# Patient Record
Sex: Male | Born: 1950 | Hispanic: No | Marital: Single | Smoking: Never smoker
Health system: Southern US, Community
[De-identification: ages and names within clinical notes are randomized; demographics above are authoritative.]

## PROBLEM LIST (undated history)

## (undated) DIAGNOSIS — K2210 Ulcer of esophagus without bleeding: Secondary | ICD-10-CM

## (undated) DIAGNOSIS — I10 Essential (primary) hypertension: Secondary | ICD-10-CM

## (undated) DIAGNOSIS — S72009A Fracture of unspecified part of neck of unspecified femur, initial encounter for closed fracture: Secondary | ICD-10-CM

## (undated) DIAGNOSIS — K226 Gastro-esophageal laceration-hemorrhage syndrome: Secondary | ICD-10-CM

## (undated) DIAGNOSIS — Z96649 Presence of unspecified artificial hip joint: Secondary | ICD-10-CM

## (undated) DIAGNOSIS — F99 Mental disorder, not otherwise specified: Secondary | ICD-10-CM

## (undated) DIAGNOSIS — M978XXA Periprosthetic fracture around other internal prosthetic joint, initial encounter: Secondary | ICD-10-CM

## (undated) DIAGNOSIS — K760 Fatty (change of) liver, not elsewhere classified: Secondary | ICD-10-CM

## (undated) DIAGNOSIS — S72019A Unspecified intracapsular fracture of unspecified femur, initial encounter for closed fracture: Secondary | ICD-10-CM

## (undated) DIAGNOSIS — E119 Type 2 diabetes mellitus without complications: Secondary | ICD-10-CM

## (undated) DIAGNOSIS — J690 Pneumonitis due to inhalation of food and vomit: Secondary | ICD-10-CM

## (undated) DIAGNOSIS — N281 Cyst of kidney, acquired: Secondary | ICD-10-CM

## (undated) DIAGNOSIS — F209 Schizophrenia, unspecified: Secondary | ICD-10-CM

## (undated) HISTORY — PX: JOINT REPLACEMENT: SHX530

---

## 2009-01-14 ENCOUNTER — Ambulatory Visit: Payer: Self-pay | Admitting: Psychiatry

## 2009-01-14 ENCOUNTER — Inpatient Hospital Stay (HOSPITAL_COMMUNITY): Admission: EM | Admit: 2009-01-14 | Discharge: 2009-01-17 | Payer: Self-pay | Admitting: Psychiatry

## 2009-01-14 ENCOUNTER — Emergency Department (HOSPITAL_COMMUNITY): Admission: EM | Admit: 2009-01-14 | Discharge: 2009-01-14 | Payer: Self-pay | Admitting: Emergency Medicine

## 2010-04-28 LAB — CBC
HCT: 41.3 % (ref 39.0–52.0)
MCV: 85.9 fL (ref 78.0–100.0)
RBC: 4.8 MIL/uL (ref 4.22–5.81)
WBC: 10.1 10*3/uL (ref 4.0–10.5)

## 2010-04-28 LAB — BASIC METABOLIC PANEL
BUN: 13 mg/dL (ref 6–23)
BUN: 15 mg/dL (ref 6–23)
CO2: 28 mEq/L (ref 19–32)
Calcium: 8.8 mg/dL (ref 8.4–10.5)
Calcium: 9.3 mg/dL (ref 8.4–10.5)
Chloride: 83 mEq/L — ABNORMAL LOW (ref 96–112)
Creatinine, Ser: 0.94 mg/dL (ref 0.4–1.5)
Creatinine, Ser: 1.13 mg/dL (ref 0.4–1.5)
GFR calc Af Amer: >60 mL/min (ref >60)
GFR calc Af Amer: >60 mL/min (ref >60)
GFR calc non Af Amer: >60 mL/min (ref >60)
Glucose, Bld: 169 mg/dL — ABNORMAL HIGH (ref 70–99)
Potassium: 2.8 mEq/L — ABNORMAL LOW (ref 3.5–5.1)
Potassium: 3.1 mEq/L — ABNORMAL LOW (ref 3.5–5.1)

## 2010-04-28 LAB — DIFFERENTIAL
Eosinophils Absolute: 0 10*3/uL (ref 0.0–0.7)
Eosinophils Relative: 0 % (ref 0–5)
Lymphs Abs: 2.5 10*3/uL (ref 0.7–4.0)
Monocytes Relative: 7 % (ref 3–12)

## 2010-04-28 LAB — GLUCOSE, CAPILLARY
Glucose-Capillary: 142 mg/dL — ABNORMAL HIGH (ref 70–99)
Glucose-Capillary: 159 mg/dL — ABNORMAL HIGH (ref 70–99)
Glucose-Capillary: 174 mg/dL — ABNORMAL HIGH (ref 70–99)
Glucose-Capillary: 180 mg/dL — ABNORMAL HIGH (ref 70–99)
Glucose-Capillary: 200 mg/dL — ABNORMAL HIGH (ref 70–99)
Glucose-Capillary: 212 mg/dL — ABNORMAL HIGH (ref 70–99)
Glucose-Capillary: 229 mg/dL — ABNORMAL HIGH (ref 70–99)

## 2010-04-28 LAB — POCT CARDIAC MARKERS
CKMB, poc: 6.6 ng/mL (ref 1.0–8.0)
Myoglobin, poc: 202 ng/mL (ref 12–200)
Troponin i, poc: <0.05 ng/mL (ref 0.00–0.09)

## 2010-04-28 LAB — RAPID URINE DRUG SCREEN, HOSP PERFORMED
Barbiturates: NOT DETECTED
Benzodiazepines: NOT DETECTED

## 2010-04-28 LAB — HEMOGLOBIN A1C: Mean Plasma Glucose: 189 mg/dL

## 2010-04-28 LAB — ETHANOL: Alcohol, Ethyl (B): <5 mg/dL (ref 0–10)

## 2012-02-26 ENCOUNTER — Inpatient Hospital Stay (HOSPITAL_COMMUNITY)
Admission: EM | Admit: 2012-02-26 | Discharge: 2012-03-04 | DRG: 885 | Disposition: A | Discharge status: Other Acute Inpatient Hospital | Payer: MEDICAID | Attending: Internal Medicine | Admitting: Internal Medicine

## 2012-02-26 ENCOUNTER — Emergency Department (HOSPITAL_COMMUNITY): Payer: Self-pay

## 2012-02-26 DIAGNOSIS — E46 Unspecified protein-calorie malnutrition: Secondary | ICD-10-CM

## 2012-02-26 DIAGNOSIS — F200 Paranoid schizophrenia: Principal | ICD-10-CM | POA: Diagnosis present

## 2012-02-26 DIAGNOSIS — N289 Disorder of kidney and ureter, unspecified: Secondary | ICD-10-CM

## 2012-02-26 DIAGNOSIS — Z9119 Patient's noncompliance with other medical treatment and regimen: Secondary | ICD-10-CM

## 2012-02-26 DIAGNOSIS — F209 Schizophrenia, unspecified: Secondary | ICD-10-CM | POA: Diagnosis present

## 2012-02-26 DIAGNOSIS — E119 Type 2 diabetes mellitus without complications: Secondary | ICD-10-CM | POA: Diagnosis present

## 2012-02-26 DIAGNOSIS — I10 Essential (primary) hypertension: Secondary | ICD-10-CM | POA: Diagnosis present

## 2012-02-26 DIAGNOSIS — Z79899 Other long term (current) drug therapy: Secondary | ICD-10-CM

## 2012-02-26 DIAGNOSIS — Z7982 Long term (current) use of aspirin: Secondary | ICD-10-CM

## 2012-02-26 DIAGNOSIS — N179 Acute kidney failure, unspecified: Secondary | ICD-10-CM | POA: Diagnosis present

## 2012-02-26 DIAGNOSIS — Z91199 Patient's noncompliance with other medical treatment and regimen due to unspecified reason: Secondary | ICD-10-CM

## 2012-02-26 DIAGNOSIS — F29 Unspecified psychosis not due to a substance or known physiological condition: Secondary | ICD-10-CM

## 2012-02-26 DIAGNOSIS — E876 Hypokalemia: Secondary | ICD-10-CM | POA: Diagnosis present

## 2012-02-26 HISTORY — DX: Type 2 diabetes mellitus without complications: E11.9

## 2012-02-26 HISTORY — DX: Essential (primary) hypertension: I10

## 2012-02-26 LAB — CBC WITH DIFFERENTIAL/PLATELET
Basophils Relative: 0 % (ref 0–1)
HCT: 44.2 % (ref 39.0–52.0)
Hemoglobin: 15 g/dL (ref 13.0–17.0)
Lymphocytes Relative: 22 % (ref 12–46)
MCHC: 33.9 g/dL (ref 30.0–36.0)
Monocytes Absolute: 0.6 10*3/uL (ref 0.1–1.0)
Monocytes Relative: 6 % (ref 3–12)
Neutro Abs: 6.9 10*3/uL (ref 1.7–7.7)

## 2012-02-26 LAB — COMPREHENSIVE METABOLIC PANEL
BUN: 27 mg/dL — ABNORMAL HIGH (ref 6–23)
CO2: 24 mEq/L (ref 19–32)
Chloride: 97 mEq/L (ref 96–112)
Creatinine, Ser: 1.71 mg/dL — ABNORMAL HIGH (ref 0.50–1.35)
GFR calc non Af Amer: 41 mL/min — ABNORMAL LOW (ref >90)
Total Bilirubin: 0.6 mg/dL (ref 0.3–1.2)

## 2012-02-26 LAB — LIPASE, BLOOD: Lipase: 37 U/L (ref 11–59)

## 2012-02-26 MED ORDER — LORAZEPAM 2 MG/ML IJ SOLN
2.0000 mg | Freq: Three times a day (TID) | INTRAMUSCULAR | Status: DC | PRN
  Administered 2012-02-27: 2 mg via INTRAMUSCULAR
  Filled 2012-02-26 (×2): qty 1

## 2012-02-26 MED ORDER — ZIPRASIDONE MESYLATE 20 MG IM SOLR
10.0000 mg | Freq: Three times a day (TID) | INTRAMUSCULAR | Status: DC | PRN
  Filled 2012-02-26 (×2): qty 20

## 2012-02-26 MED ORDER — POTASSIUM CHLORIDE CRYS ER 20 MEQ PO TBCR
40.0000 meq | EXTENDED_RELEASE_TABLET | Freq: Once | ORAL | Status: DC
  Filled 2012-02-26 (×3): qty 2

## 2012-02-26 MED ORDER — ZIPRASIDONE MESYLATE 20 MG IM SOLR
10.0000 mg | Freq: Three times a day (TID) | INTRAMUSCULAR | Status: DC | PRN
  Administered 2012-02-27: 10 mg via INTRAMUSCULAR
  Filled 2012-02-26: qty 20

## 2012-02-26 MED ORDER — SODIUM CHLORIDE 0.9 % IV SOLN: Freq: Once | INTRAVENOUS | Status: DC

## 2012-02-26 NOTE — ED Notes (Signed)
Patient transported to X-ray 
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
## 2012-02-26 NOTE — ED Notes (Signed)
Pt completing telepsych with ACT team at bedside, pt easily agitated
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
## 2012-02-26 NOTE — ED Notes (Signed)
Pt calm and resting at present, no complaints at this time.  Telepsych paperwork completed and faxed.
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
## 2012-02-26 NOTE — ED Notes (Addendum)
Spoke with [name], MD who is the pts sister, pt sister states, "My brother was diagnosed with a mood disorder when he was a teenager. He was hospitalized a couple times in [place]. He came to the [place] in the 80's. He was seen at your hospital 3-5 years ago.I refilled some of the medication for my brother after he was discharged from [facility]. He has been taking 10 mg Haldol in the morning & 20 mg Haldol in the evening daily, but I am unsure of the last time the medication was taken or refilled. His caregiver Lafonda Mosses (last name unknown) has taken him to a (name unknown) physician since then & he was prescribed Metformin. I don't know the dosage or when it was last refilled. I did call him & ask him everyday to read the labels on his medicine & to take his medication. He then got upset & told me not to ask anymore questions, so I quit asking.The phone numbers of the 2 pharmacies are [phone] & [phone], they would call me when they prescriptions were out." the sister requests to be updated on plan of care & was given the hospital's number to call to request future updates

## 2012-02-26 NOTE — ED Notes (Signed)
Pt states that he is "always ok." Pt states that he is originally from [place]. Pt states he does not have a place to stay right now.

## 2012-02-27 ENCOUNTER — Encounter (HOSPITAL_COMMUNITY): Payer: Self-pay | Admitting: *Deleted

## 2012-02-27 DIAGNOSIS — N179 Acute kidney failure, unspecified: Secondary | ICD-10-CM | POA: Diagnosis present

## 2012-02-27 DIAGNOSIS — F209 Schizophrenia, unspecified: Secondary | ICD-10-CM | POA: Diagnosis present

## 2012-02-27 DIAGNOSIS — Z9119 Patient's noncompliance with other medical treatment and regimen: Secondary | ICD-10-CM

## 2012-02-27 DIAGNOSIS — I10 Essential (primary) hypertension: Secondary | ICD-10-CM | POA: Diagnosis present

## 2012-02-27 DIAGNOSIS — N289 Disorder of kidney and ureter, unspecified: Secondary | ICD-10-CM

## 2012-02-27 DIAGNOSIS — E119 Type 2 diabetes mellitus without complications: Secondary | ICD-10-CM | POA: Diagnosis present

## 2012-02-27 LAB — URINALYSIS, ROUTINE W REFLEX MICROSCOPIC
Glucose, UA: 250 mg/dL — AB
Hgb urine dipstick: NEGATIVE
Leukocytes, UA: NEGATIVE
Specific Gravity, Urine: 1.021 (ref 1.005–1.030)
Urobilinogen, UA: 0.2 mg/dL (ref 0.0–1.0)

## 2012-02-27 LAB — BASIC METABOLIC PANEL
CO2: 24 mEq/L (ref 19–32)
GFR calc non Af Amer: 30 mL/min — ABNORMAL LOW (ref >90)
Glucose, Bld: 227 mg/dL — ABNORMAL HIGH (ref 70–99)
Potassium: 3.1 mEq/L — ABNORMAL LOW (ref 3.5–5.1)
Sodium: 137 mEq/L (ref 135–145)

## 2012-02-27 LAB — RAPID URINE DRUG SCREEN, HOSP PERFORMED: Opiates: NOT DETECTED

## 2012-02-27 LAB — URINE MICROSCOPIC-ADD ON

## 2012-02-27 MED ORDER — POTASSIUM CHLORIDE CRYS ER 20 MEQ PO TBCR
40.0000 meq | EXTENDED_RELEASE_TABLET | Freq: Once | ORAL | Status: AC
  Administered 2012-02-27: 40 meq via ORAL

## 2012-02-27 MED ORDER — LORAZEPAM 2 MG/ML IJ SOLN
2.0000 mg | Freq: Once | INTRAMUSCULAR | Status: AC
  Administered 2012-02-27: 2 mg via INTRAMUSCULAR

## 2012-02-27 MED ORDER — ACETAMINOPHEN 325 MG PO TABS: 650.0000 mg | ORAL_TABLET | Freq: Four times a day (QID) | ORAL | Status: DC | PRN

## 2012-02-27 MED ORDER — HALOPERIDOL 2 MG PO TABS
2.5000 mg | ORAL_TABLET | Freq: Four times a day (QID) | ORAL | Status: DC | PRN
  Filled 2012-02-27: qty 1

## 2012-02-27 MED ORDER — HALOPERIDOL 5 MG PO TABS
5.0000 mg | ORAL_TABLET | Freq: Two times a day (BID) | ORAL | Status: DC
  Filled 2012-02-27 (×3): qty 1

## 2012-02-27 MED ORDER — ACETAMINOPHEN 650 MG RE SUPP: 650.0000 mg | Freq: Four times a day (QID) | RECTAL | Status: DC | PRN

## 2012-02-27 MED ORDER — SODIUM CHLORIDE 0.9 % IV BOLUS (SEPSIS): 1000.0000 mL | Freq: Once | INTRAVENOUS | Status: DC

## 2012-02-27 MED ORDER — ENOXAPARIN SODIUM 40 MG/0.4ML ~~LOC~~ SOLN
40.0000 mg | SUBCUTANEOUS | Status: DC
  Filled 2012-02-27 (×6): qty 0.4

## 2012-02-27 MED ORDER — POTASSIUM CHLORIDE IN NACL 20-0.9 MEQ/L-% IV SOLN
INTRAVENOUS | Status: DC
  Filled 2012-02-27 (×24): qty 1000

## 2012-02-27 MED ORDER — HALOPERIDOL LACTATE 5 MG/ML IJ SOLN: 2.5000 mg | Freq: Four times a day (QID) | INTRAMUSCULAR | Status: DC | PRN

## 2012-02-27 MED ORDER — HALOPERIDOL 5 MG PO TABS
5.0000 mg | ORAL_TABLET | Freq: Once | ORAL | Status: AC
  Administered 2012-02-27: 5 mg via ORAL
  Filled 2012-02-27: qty 1

## 2012-02-27 MED ORDER — HALOPERIDOL LACTATE 5 MG/ML IJ SOLN
5.0000 mg | Freq: Once | INTRAMUSCULAR | Status: DC
  Filled 2012-02-27: qty 1

## 2012-02-27 MED ORDER — SODIUM CHLORIDE 0.9 % IV BOLUS (SEPSIS): 2000.0000 mL | Freq: Once | INTRAVENOUS | Status: DC

## 2012-02-27 NOTE — ED Notes (Signed)
Pt provided urine sample but refuses to have IV started at this time . Plan to reassess PT and attempt to start IV in .

## 2012-02-27 NOTE — ED Notes (Signed)
Internal Medicine on -call notified PT refused IV and ECG.

## 2012-02-27 NOTE — ED Notes (Signed)
  Dr. Norlene Campbell updated re: refusal of potassium med and agitation when approached to assist with hygiene. Also updated re: outstanding order for IVF and Verbal order rec'd discontinue order for IV.

## 2012-02-27 NOTE — ED Notes (Signed)
Pt requested a cup of coffee. Decaf coffee given

## 2012-02-27 NOTE — ED Provider Notes (Addendum)
Pt alert, content, nad. Discussed w act team, placement pending.  Act asks for repeat k. Lab added.   Suzi Roots, MD 02/27/12 [phone]   Recheck pt not eating/drinking.  Bun/cr increased from prior day's.  It is difficult to definitively determine pts baseline renal fxn as last prior labs were 4 yrs ago - given hx htn, diabetes, pt may have some element of renal insuff at baseline, but again, difficult to know.  Given inability to place in psych facility given abn/inc cr, low k, not eating/drinking - will admit to med service. Ivf.     Suzi Roots, MD 02/27/12 [phone]

## 2012-02-27 NOTE — ED Notes (Signed)
PT continues to refuse IV .

## 2012-02-27 NOTE — ED Notes (Signed)
PT reports The DR. Will have to put it in writing and give it to him before he will have an IV.
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
## 2012-02-27 NOTE — ED Notes (Signed)
NO personal items listed on admission.

## 2012-02-27 NOTE — ED Notes (Signed)
Cleaned pt up with help from [employer] and [employer].9:16am JG.

## 2012-02-27 NOTE — BH Assessment (Signed)
[facility] Assessment Progress Note      Pt cannot be reviewed for admission until his potassium level increases.

## 2012-02-27 NOTE — ED Notes (Signed)
MD at bedside. 

## 2012-02-27 NOTE — ED Notes (Signed)
UNable to obtain ECG as ordered because of PT refusal.

## 2012-02-27 NOTE — ED Notes (Addendum)
Lying on bed in stool. Asked pt if we can assist him in cleaning up. Pt refuses assistance stating to come back at 1000 and give him the towels and he will do it then. Becomes agitated when asked to allow staff to clean him up. Otherwise just lying in bed talking - no one else in room
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
## 2012-02-27 NOTE — ED Notes (Signed)
PT continue to refuse IV placement. MD at bed side to talk with Pt about IV placement.

## 2012-02-27 NOTE — ED Notes (Signed)
Pt bathed and blue scrubs placed on pt.

## 2012-02-27 NOTE — ED Notes (Signed)
Attempted to start IV but Pt reports He does not want one. Pt was instructed that the MD ordered IV for fluids. Pt stated " The DR. Is not GOD".  Pt continue to refuse IV.

## 2012-02-28 MED ORDER — HALOPERIDOL 5 MG PO TABS
5.0000 mg | ORAL_TABLET | Freq: Two times a day (BID) | ORAL | Status: DC
  Administered 2012-02-28 – 2012-03-01 (×6): 5 mg via ORAL
  Filled 2012-02-28 (×9): qty 1

## 2012-02-28 MED ORDER — HYDRALAZINE HCL 10 MG PO TABS
10.0000 mg | ORAL_TABLET | Freq: Three times a day (TID) | ORAL | Status: DC | PRN
  Administered 2012-02-28 – 2012-02-29 (×2): 10 mg via ORAL
  Filled 2012-02-28 (×2): qty 1

## 2012-02-28 MED ORDER — HYDRALAZINE HCL 20 MG/ML IJ SOLN
10.0000 mg | Freq: Once | INTRAMUSCULAR | Status: DC
Start: 2012-02-28 — End: 2012-02-29
  Filled 2012-02-28: qty 0.5

## 2012-02-28 MED ORDER — HALOPERIDOL LACTATE 5 MG/ML IJ SOLN
5.0000 mg | Freq: Two times a day (BID) | INTRAMUSCULAR | Status: DC
  Filled 2012-02-28 (×6): qty 1

## 2012-02-28 NOTE — Progress Notes (Signed)
Pt's BP 182/109.  MD notified.  Awaiting new orders.  Will carry out and continue to monitor.  Hector Shade Mulberry

## 2012-02-28 NOTE — Progress Notes (Signed)
Patient refused vitals.

## 2012-02-28 NOTE — Progress Notes (Signed)
Patient continues to refuse treatment. His potassium was 3.1, he reused IV access and so we could not administer  IV fluids with potassium. He has also refused VS and his AM labs. MD on call @ [phone] was paged and informed. Sitter remain  at his side
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
## 2012-02-28 NOTE — Progress Notes (Signed)
Pt's BP - 200/100.  10mg  PO Hydralazine given and MD notified.  David Lang
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
## 2012-02-29 DIAGNOSIS — F200 Paranoid schizophrenia: Secondary | ICD-10-CM

## 2012-02-29 MED ORDER — METOPROLOL TARTRATE 50 MG PO TABS
50.0000 mg | ORAL_TABLET | Freq: Two times a day (BID) | ORAL | Status: DC
  Administered 2012-02-29: 50 mg via ORAL
  Filled 2012-02-29 (×4): qty 1

## 2012-02-29 MED ORDER — HYDRALAZINE HCL 10 MG PO TABS
10.0000 mg | ORAL_TABLET | Freq: Four times a day (QID) | ORAL | Status: DC | PRN
  Administered 2012-02-29: 10 mg via ORAL
  Filled 2012-02-29: qty 1
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
## 2012-02-29 NOTE — Progress Notes (Signed)
02/29/2012 9:41 AM  Spoke with physician over the telephone regarding sitter status.  MD confimed that the patient DOES NOT need a suicide sitter, IS NOT on suicide precautions.  If sitter needed, it will be for safety purposes at this point.  Spoke with RN, Kami, who states at this point patient remains calm in the room.  Spoke with Albin Felling with house coverage, decision made to trial patient without sitter for four hours to see how patient does.  Will continue to monitor patient. Eunice Blase
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
## 2012-02-29 NOTE — Progress Notes (Signed)
Internal Medicine Attending  Date: 02/29/2012  Patient name: David Lang Medical record number: [id number] Date of birth: 29-May-1950 Age: 62 y.o. Gender: male  I saw and evaluated the patient. I reviewed the interim history and physical with Drs. Sherrine Maples and [employer].  We formulated the assessment and plan together.  David Lang continues to have pressured speech that varies between [place] and possibly Arabic as well as generalized paranoia.  We were able to convince him to take his Haldol orally this AM and reportedly his behavior is more calm this AM.  In addition, he may be eating slightly better than he has recently per nursing reports.  He continues to refuse any blood work or needles and may continue to do so.  From a medical standpoint, his acute renal insufficiency was likely related to a pre-renal azotemia from failure to keep himself hydrated.  If his oral intake returns to normal, his renal function will improve and he will not require lab work to confirm our clinical impression.  We will continue to negotiate with him concerning taking the haldol in hopes of improving his paranoia and oral intake.  If Psychiatry feels he requires inpatient therapy from a schizophrenia standpoint then he will need to be transferred to [facility] for such therapy.  Until we reach that point of normal oral intake we will continue to monitor him on the Internal Medicine Service.

## 2012-02-29 NOTE — Progress Notes (Signed)
Clinical Social Work Department CLINICAL SOCIAL WORK PSYCHIATRY SERVICE LINE ASSESSMENT 02/29/2012  Patient:  David Lang  Account:  [id number]  Admit Date:  02/26/2012  Clinical Social Worker:  Unk Lightning, LCSW  Date/Time:  02/29/2012 09:30 AM Referred by:  Physician  Date referred:  02/29/2012 Reason for Referral  Behavioral Health Issues   Presenting Symptoms/Problems (In the person's/family's own words):   Psych consulted due to patient diagnosed with schizophrenia   Abuse/Neglect/Trauma History (check all that apply)  Denies history   Abuse/Neglect/Trauma Comments:   Psychiatric History (check all that apply)  Outpatient treatment  Inpatient/hospitilization   Psychiatric medications:  Haldol, Geodon, Ativan   Current Mental Health Hospitalizations/Previous Mental Health History:   Patient denies any MH diagnosis. Sister reports patient diagnosed with paranoid schizophrenia when he was a teenager.   Current provider:   None   Place and Date:   None   Current Medications:   acetaminophen, acetaminophen, haloperidol, haloperidol lactate, hydrALAZINE                        . enoxaparin (LOVENOX) injection  40 mg Subcutaneous Q24H  . haloperidol  5 mg Oral BID   Or     . haloperidol lactate  5 mg Intramuscular BID  . hydrALAZINE  10 mg Intramuscular Once  . sodium chloride  2,000 mL Intravenous Once   Previous Impatient Admission/Date/Reason:   Patient admitted to [facility] in 12/10 for paranoid thoughts. Per sister, patient admitted to psych facilties in [place] when he was 62 years old and 62 years old.   Emotional Health / Current Symptoms    Suicide/Self Harm  None reported   Suicide attempt in the past:   Patient denies any SI or HI. Sister reports no suicide attempts in the past.   Other harmful behavior:   Psychotic/Dissociative Symptoms  Disorganized Speech  Inability to care for self   Other Psychotic/Dissociative Symptoms:    Attention/Behavioral  Symptoms  Restless  Withdrawn   Other Attention / Behavioral Symptoms:    Cognitive Impairment  Unable to accurately assess   Other Cognitive Impairment:    Mood and Adjustment  Aggressive/frustrated    Stress, Anxiety, Trauma, Any Recent Loss/Stressor  None reported   Anxiety (frequency):   Phobia (specify):   Compulsive behavior (specify):   Obsessive behavior (specify):   Other:   Substance Abuse/Use  None   SBIRT completed (please refer for detailed history):  N  Self-reported substance use:   Patient denies any substance use. Sister confirms no use.   Urinary Drug Screen Completed:  Y Alcohol level:   Negative screen    Environmental/Housing/Living Arrangement  Stable housing   Who is in the home:   Alone   Emergency contact:  [name]   Financial  IPRS   Patient's Strengths and Goals (patient's own words):   Patient has supportive sisters.   Clinical Social Worker's Interpretive Summary:   CSW received referral to complete psychosocial assessment. CSW reviewed chart and spoke with bedside RN regarding patient's behavior. CSW met with patient at bedside. No visitors present.    CSW introduced myself and explained role. Patient laying in bed with eyes closed. Patient agreeable to CSW speaking but refused to answer most questions. Patient reports that CSW should know why he is at the hospital. Patient denies MH diagnosis. Patient originally states that he is prescribed Haldol but then later reports he does not take any medication. When asked about substance use patient  reports that "you think alcohol is bad. It's not bad. Check the chart." Patient refuses to answer several questions and reports that CSW can review his chart.    Patient is withdrawn and resistant throughout assessment. Patient keeps eyes closed during majority of assessment but occasionally has brief eye contact. Patient unwilling to properly participate in assessment. Patient refuses to  sign ROI to gather any information regarding treatment in the community. CSW called [facility] but they refused to disclose any information with a ROI form.    CSW spoke with sister via phone. Sister is MD and lives in [place]. Sister is able to provide the following information regarding patient. Patient was diagnosed with schizophrenia when he was a teenager. Patient was hospitalized twice in [place] and once in the [place]. Patient lived with mother until she passed away in Jun 22, 2003. Patient has lived alone ever since. Patient does not work and often stays at home. Patient hired an Engineer, production that would help on irregular basis. Patient is often too paranoid to even allow aide to enter the home.  Patient does not have any outpatient follow up and refuses to see a psychiatrist. Patient becomes paranoid often and sister is unable to speak with patient because he refuses to answer her phone calls. Sister used to call patient every morning and evening to remind him to take his medications but patient refuses sister to complete his task. Sister is unsure if patient has taken any medication. Sister gave CSW number to pharmacies that patient uses. One number is a wrong number and the other pharmacy reports they have not filled any prescriptions for patient since 06-22-10. It is unclear who prescribes patient medication but it appears that sister might be the MD who prescribes his medication.    Per chart review, psych MD recommending that patient might need inpatient psych treatment again. CSW will continue to follow and await patient to be medically stable.   Disposition:  Recommend Psych CSW continuing to support while in hospital

## 2012-03-01 DIAGNOSIS — F200 Paranoid schizophrenia: Principal | ICD-10-CM

## 2012-03-01 DIAGNOSIS — E119 Type 2 diabetes mellitus without complications: Secondary | ICD-10-CM

## 2012-03-01 DIAGNOSIS — N179 Acute kidney failure, unspecified: Secondary | ICD-10-CM

## 2012-03-01 DIAGNOSIS — I10 Essential (primary) hypertension: Secondary | ICD-10-CM

## 2012-03-01 MED ORDER — MENTHOL 3 MG MT LOZG
1.0000 | LOZENGE | OROMUCOSAL | Status: DC | PRN
  Filled 2012-03-01: qty 9

## 2012-03-01 MED ORDER — ATENOLOL 100 MG PO TABS
100.0000 mg | ORAL_TABLET | Freq: Every day | ORAL | Status: DC
  Administered 2012-03-01 – 2012-03-04 (×3): 100 mg via ORAL
  Filled 2012-03-01 (×4): qty 1

## 2012-03-01 NOTE — Progress Notes (Addendum)
Pt requested lozenge for sore throat. Pt refuses cepacol lozenge. Pt states " I know what lozenge look like that is not the right package".  Informed pt that we do not have the individually wrapped ones like at the drug store. He does not want this kind.
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
## 2012-03-01 NOTE — Progress Notes (Signed)
Internal Medicine Attending  Date: 03/01/2012  Patient name: Derreon Consalvo Medical record number: [id number] Date of birth: [birth year] Age: 62 y.o. Gender: male  I saw and evaluated the patient. I reviewed the resident's note by Dr. Sherrine Maples and I agree with the resident's findings and plans as documented in her progress note.  Nursing reports the patient's oral intake is stable. With negotiation, he will take some of his oral medications, but only those he is familiar with. He continues to refuse any blood draws or intravenous fluids. This is not a problem as long as his oral intake remains stable. His most recent blood pressure was 169/96. It must be remembered he has chronic hypertension. Acutely lowering his blood pressure can do more harm in a chronic situation. Perfection of blood pressure control should not be expected given his profound paranoia at this time. He is medically stable to be transferred to any psychiatric hospital able to manage his paranoia. With chronic hypertension several days of moderate elevation, such as he is experiencing now, should be very well tolerated. We should not lose sight of what his main problem is which is his underlying paranoid schizophrenia. Management of his chronic medical problems may become much easier once his paranoia is addressed with the assistance our mental health professionals. We will continue to provide supportive care until that can be arranged.

## 2012-03-01 NOTE — Progress Notes (Signed)
Pt continues to refuse his labs drawn for this morning.
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
## 2012-03-01 NOTE — Progress Notes (Signed)
Pt continues to refuse medications.

## 2012-03-01 NOTE — Progress Notes (Signed)
Pt continues to  Refuse  Med this  Pm,  Will  Offer at later  time

## 2012-03-01 NOTE — Progress Notes (Signed)
Utilization review completed.  

## 2012-03-01 NOTE — Progress Notes (Signed)
Clinical Social Work Progress Note PSYCHIATRY SERVICE LINE 03/01/2012  Patient:  David Lang  Account:  [id number]  Admit Date:  02/26/2012  Clinical Social Worker:  Unk Lightning, LCSW  Date/Time:  03/01/2012 11:45 AM  Review of Patient  Overall Medical Condition:   Per MD, patient medically stable to dc when disposition determined   Participation Level:  Active  Participation Quality  Guarded   Other Participation Quality:   Affect  Excited   Cognitive  Alert   Reaction to Medications/Concerns:   None reported   Modes of Intervention  Support  Confrontation  Behaviors/Psychosis   Summary of Progress/Plan at Discharge   CSW met with patient and psych MD at bedside. Patient laying in bed and agreeable to session.    Patient reports that he was brought to the hospital by the [facility] and that he does not need to be at the hospital because he has no physical or mental health needs. Patient reports in his country ([place] that he would be allowed to leave the hospital. Patient struggled with participating in session and was guarded when answering questions. Patient often started speaking about the government or politics when attempting to answer questions. Patient refuses to answer any questions to complete MSE.    Patient resistant throughout session. It appears that patient is too paranoid to answer questions. Patient feels that family has been telling lies about him and he wants to dc home.    Psych MD recommends inpatient psychiatric facility at dc. CSW sent referrals to the following facilities:    [facility]  [facility]  [facility]  [facility]    CSW will continue to follow in order to find placement for patient.    Patient was placed under IVC and served by [employer]. IVC papers valid for 7 days and will expire on 03/08/12.

## 2012-03-01 NOTE — Progress Notes (Signed)
Clinical Social Work  CSW continues to follow up on referrals for inpatient psychiatric placement. CSW spoke with [name] who has denied patient due to feeling that patient needs LT care and they can only provide acute stays. [facility] reports that patient is not medically stable due to refusing treatment and hypertensive episodes. CSW spoke with [facility] who requested more information but still reviewing. No response from [facility] or [facility] at this time. CSW will continue to follow.   [place], [place] [phone]

## 2012-03-02 MED ORDER — ARIPIPRAZOLE 5 MG PO TABS
5.0000 mg | ORAL_TABLET | Freq: Every day | ORAL | Status: DC
  Administered 2012-03-03 – 2012-03-04 (×2): 5 mg via ORAL
  Filled 2012-03-02 (×2): qty 1

## 2012-03-02 MED ORDER — BENZTROPINE MESYLATE 0.5 MG PO TABS
0.5000 mg | ORAL_TABLET | Freq: Two times a day (BID) | ORAL | Status: DC | PRN
  Filled 2012-03-02: qty 1

## 2012-03-02 MED ORDER — HALOPERIDOL LACTATE 5 MG/ML IJ SOLN
5.0000 mg | Freq: Every day | INTRAMUSCULAR | Status: DC
  Filled 2012-03-02 (×2): qty 1

## 2012-03-02 MED ORDER — AMLODIPINE BESYLATE 5 MG PO TABS
5.0000 mg | ORAL_TABLET | Freq: Every day | ORAL | Status: DC
  Administered 2012-03-03: 5 mg via ORAL
  Filled 2012-03-02 (×2): qty 1

## 2012-03-02 MED ORDER — HALOPERIDOL 5 MG PO TABS
5.0000 mg | ORAL_TABLET | Freq: Every day | ORAL | Status: DC
  Administered 2012-03-03: 5 mg via ORAL
  Filled 2012-03-02 (×2): qty 1

## 2012-03-02 NOTE — Progress Notes (Signed)
Obtained 3 bottles of water from [facility] for patient. Pt was refusing to take PM medications unless he had unopened water. Pt agreeable to take pills at this time. Will monitor. C.Seairra Otani, RN.

## 2012-03-02 NOTE — Progress Notes (Signed)
Pt. Has refused to take medications.  Has been up ambulating in hall with contact guard supervision.  Responds to Clinical research associate with strict limit setting.  Tends to be verbally aggressive, but calms when asked to.  Escalates easily.  He believes we are poisoning him, though will eat the food.

## 2012-03-02 NOTE — Consult Note (Signed)
Patient Identification:  David Lang Date of Evaluation:  03/02/2012  Reason for Consult:  Schizophrenia, untreated  Referring Provider: Dr. August Saucer  History of Present Illness:Pt has been treated at [facility] after which, his sister MD in [place] had been writing Rx for him.  Check with pharmacy on admission confirms pt has not filled medication Rx since January 2013  Past Psychiatric History: Early sx in [place] of mood disorder and was an inpatient there several times.    Past Medical History:     Past Medical History  Diagnosis Date  . Hypertension   . Diabetes mellitus without complication       History reviewed. No pertinent past surgical history.  Allergies: No Known Allergies  Current Medications:  Prior to Admission medications   Medication Sig Start Date End Date Taking? Authorizing Provider  aspirin EC 81 MG tablet Take 81 mg by mouth daily.   Yes Historical Provider, MD  glipiZIDE (GLUCOTROL XL) 10 MG 24 hr tablet Take 10 mg by mouth daily.   Yes Historical Provider, MD  losartan (COZAAR) 100 MG tablet Take 100 mg by mouth daily.   Yes Historical Provider, MD    Social History:    does not have a smoking history on file. He does not have any smokeless tobacco history on file. His alcohol and drug histories not on file.   Family History:    No family history on file.  Mental Status Examination/Evaluation: Objective:  Appearance: Disheveled and long hair full, long beard, bushy moustache  Eye Contact::  Good  Speech:  Garbled  Volume:  Increased  Mood:  Paranoid, uncooperative  Affect:  Congruent, Inappropriate and paranoid   Thought Process:  Disorganized and paranoid  Orientation:  Other:  Pt is oriented to person and place only; unable to appreciate severity of symptoms  Thought Content:  Paranoid Ideation  Suicidal Thoughts:  No  Homicidal Thoughts:  No  Judgement:  Impaired  Insight:  Lacking   DIAGNOSIS:   AXIS I   Schizophrenia, paranoid type  AXIS II   Deferred  AXIS III See medical notes.  AXIS IV housing problems, other psychosocial or environmental problems, problems related to social environment, problems with access to health care services and pt is unable to appreciate his need for all medical and psychiatric medications  AXIS V 41-50 serious symptoms   Assessment/Plan:  Discussed with Dr. Josem Kaufmann, Dr. Sherrine Maples, sisters:  Dr. Philipp Deputy, [phone]; [name] in [place] [phone] and [facility] were unaware that pt had not been taking his antipsychotic medication.  They each say they are in agreement with giving pt  Injectable antipsychotic medication as opposed to oral medication to assure his symptoms are minimized or eliminated.     Dr. Josem Kaufmann states that he is has received medication to stabilize his blood pressure.  He is confident that he will continue to improve with continued medication.     This pt does not have capacity to appreciate his paranoid symptoms or the relief he may gain from taking his antipsychotic medication.       To assure the efficacy and safety of an IM antipsychotic, the oral dose is to be given first to assess tolerability.  If it is agreeable with pt, the IM antipsychotic may be initiated  IM dose every FOUR weeks to maintain minimal if not elimination of psychotic symptoms RECOMMENDATION:  1.  Pt does not have capacity to participate fully in psychiatric treatment.  2.  Pt is assess and is  medically stable although not with       Optimal values - Dr. Charlesetta Shanks note well taken for pt who has not been taking any medications.  3.  Suggest oral Abilify  5 mg daily to assess tolerability in combination with reduced dose of Haldol 5mg  daily.  4.  Monitor pt for EPS:  Muscle rigidity, restlessness, inability to remain still and use Cogentin 0.5 mg twice daily PRN.  5.  IF tolerated, oral Abilify dose for two weeks, will be followed by Abilify Maintaina IM [water soluble]  6.  Suggest transfer to psychiatric  inpatient care, [facility] or comparable facility. 7.  Will follow pt.  Mickeal Skinner MD 03/02/2012 6:34 PM
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
## 2012-03-02 NOTE — Progress Notes (Signed)
Clinical Social Work  CSW spoke with [facility] who reports they cannot accept patient without lab work. CSW spoke with Minerva Areola [facility]) at [facility] and reported [facility]'s decision. AC reports same information and reports that he spoke with Dr. Ferol Luz about getting a second opinion to state that patient has lost his right to refuse medications or treatment. CSW will continue to follow to assist placement.  [place], [place] [phone]

## 2012-03-02 NOTE — Progress Notes (Signed)
Clinical Social Work  CSW spoke with [facility] who reports that they have reviewed patient but will wait to make a decision until CSW has heard a response from [facility]. CSW agreed to keep [facility] updated. CSW called [facility] who reports they are still reviewing patient's information. CSW will continue to follow.  [place], [place] [phone]

## 2012-03-02 NOTE — Progress Notes (Signed)
Internal Medicine Attending  Date: 03/02/2012  Patient name: David Lang Medical record number: [id number] Date of birth: 1951/01/02 Age: 62 y.o. Gender: male  I saw and evaluated the patient. I reviewed the resident's note by Dr. Sherrine Maples and I agree with the resident's findings and plans as documented in her progress note.  The patient continues to demonstrate signs and symptoms of extreme paranoia. Per nursing, his oral intake has actually improved. His blood pressure this morning was 159/87. I suspect this has been a level he is very use to given that he has not refilled any of his medications for over a year and has underlying chronic hypertension. With blood pressure levels in this range, and our continued attempts to encourage him to take his antihypertensive medications, we feel we've been successful in acutely managing his chronic hypertension in the setting of extreme paranoia.  With improved oral intake there is absolutely no indication to repeat any blood work as his prerenal azotemia most likely has resolved. The situation is akin to something we see in her community over 100 times a day.  Individuals who have gastroenteritis with nausea and vomiting and may not feel like eating for a couple of days would likely be found to have a pre-renal azotemia if their blood was checked.  From a medical standpoint, there is absolutely no indication to go identify these individuals and obtain BMPs on all of them to look for these expected changes in blood values.  There is even less indication to track these patients down to check a follow-up BMP to prove resolution of these mild changes in blood work results after their symptoms resolve and their oral intake returns to normal.  We are confident enough in our clinical abilities to treat and assess the patient using our clinical skills, rather than working to document "labs are better" when we can already see the patient is better, thereby routinely avoiding the  waste of our community's medical resources.  Although David Lang has "lost his right to refuse medical interventions" the physicians attending to him have not lost their responsibility to protect him from unnecessary interventions.  I will ask our Behavioral Health colleagues to reflect upon this and then reconsider their sense of urgency in actively participating in the management of his overarching issue: his decompensated paranoid schizophrenia.  Requiring unnecessary testing is missing the point and delaying the delivery of needed specialty care.  It is my medical opinion he is now stable for transfer to a Psychiatric facility able to manage his paranoid schizophrenia, which he desperately needs.  I hope I can alleviate their unfounded concerns about his medical appropriateness for transfer.

## 2012-03-02 NOTE — Progress Notes (Signed)
Clinical Social Work Progress Note PSYCHIATRY SERVICE LINE 03/02/2012  Patient:  FRED Ferrero  Account:  [id number]  Admit Date:  02/26/2012  Clinical Social Worker:  Unk Lightning, LCSW  Date/Time:  03/02/2012 12:00 N  Review of Patient  Overall Medical Condition:   Per MD, patient medically stable to dc.   Participation Level:  Minimal  Participation Quality  Guarded   Other Participation Quality:   Affect  Anxious   Cognitive  Alert   Reaction to Medications/Concerns:   None reported   Modes of Intervention  Support  Solution-Focused   Summary of Progress/Plan at Discharge   CSW met with patient at bedside. Patient laying in bed staring at the ceiling. Patient reports that he does not want to watch tv and does not want visitors.    Patient reports "I am mentally better. I'm physically better. I need nothing but three packs of underwear." CSW spoke with patient regarding getting a friend or family member to bring belongings from home. Patient reports that he does not want to talk to any friends or family. Patient remains fixated on underwear and does not want to discuss any further symptoms.    Patient remains guarded and becomes anxious when CSW asks patient to problem solve. Patient resistant to answer questions and continues to report he wants to return home.    CSW continues to work on placement for patient. [facility] and [facility] has been contacted and are reviewing referral in order to determine if they can accept patient. CSW will continue to follow.

## 2012-03-03 MED ORDER — AMLODIPINE BESYLATE 10 MG PO TABS
10.0000 mg | ORAL_TABLET | Freq: Every day | ORAL | Status: DC
  Administered 2012-03-03 – 2012-03-04 (×2): 10 mg via ORAL
  Filled 2012-03-03 (×2): qty 1
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
## 2012-03-03 NOTE — Progress Notes (Signed)
Clinical Social Work  Patient was discussed during [employer]. CSW presented barriers that patient was denied at [facility] requesting recent labs and for patient to be compliant with medical recommendations. Dr Jacky Kindle agreeable to contact [name] regarding patient. CSW will continue to follow.  [place], [place] [phone]

## 2012-03-03 NOTE — Treatment Plan (Signed)
David Lang has been accepted to [facility] pending a 400 hall bed which will become available on 03/04/12. Jody in CSW has been notified at [phone].  She will pass on the info to [employer], CSW.
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
## 2012-03-03 NOTE — Progress Notes (Signed)
Internal Medicine Attending  Date: 03/03/2012  Patient name: David Lang Medical record number: [id number] Date of birth: [birth year] Age: 62 y.o. Gender: male  I saw and evaluated the patient. I reviewed the resident's note by Dr. Sherrine Maples and I agree with the resident's findings and plans as documented in her progress note.  When I visited the patient this morning he was lying comfortably in bed chanting with his eyes closed. His paranoia persists and we continue to have to negotiate for him to take his oral medications. From a medical standpoint he remains stable with his chronic medical conditions. We continue to await a decision from the psychiatric facilities who may be capable of caring for his paranoid schizophrenia. We will continue supportive care until this is achieved.

## 2012-03-03 NOTE — Progress Notes (Signed)
Per Minerva Areola at [facility], pt has been accepted pending bed availability on the 400 hall, 03/04/12.  Psych CSW informed and will f/u in am.

## 2012-03-04 ENCOUNTER — Inpatient Hospital Stay (HOSPITAL_COMMUNITY)
Admission: AD | Admit: 2012-03-04 | Discharge: 2012-03-30 | DRG: 885 | Disposition: A | Discharge status: Other Acute Inpatient Hospital | Payer: No Typology Code available for payment source | Source: Intra-hospital | Attending: Emergency Medicine | Admitting: Emergency Medicine

## 2012-03-04 ENCOUNTER — Encounter (HOSPITAL_COMMUNITY): Payer: Self-pay

## 2012-03-04 ENCOUNTER — Telehealth (HOSPITAL_COMMUNITY): Payer: Self-pay | Admitting: Licensed Clinical Social Worker

## 2012-03-04 ENCOUNTER — Encounter (HOSPITAL_COMMUNITY): Payer: Self-pay | Admitting: Licensed Clinical Social Worker

## 2012-03-04 DIAGNOSIS — F209 Schizophrenia, unspecified: Secondary | ICD-10-CM

## 2012-03-04 DIAGNOSIS — Z9114 Patient's other noncompliance with medication regimen: Secondary | ICD-10-CM

## 2012-03-04 DIAGNOSIS — Z91148 Patient's other noncompliance with medication regimen for other reason: Secondary | ICD-10-CM

## 2012-03-04 DIAGNOSIS — E1122 Type 2 diabetes mellitus with diabetic chronic kidney disease: Secondary | ICD-10-CM | POA: Diagnosis present

## 2012-03-04 DIAGNOSIS — Z9119 Patient's noncompliance with other medical treatment and regimen: Secondary | ICD-10-CM

## 2012-03-04 DIAGNOSIS — I10 Essential (primary) hypertension: Secondary | ICD-10-CM

## 2012-03-04 DIAGNOSIS — N179 Acute kidney failure, unspecified: Secondary | ICD-10-CM

## 2012-03-04 DIAGNOSIS — Z7982 Long term (current) use of aspirin: Secondary | ICD-10-CM

## 2012-03-04 DIAGNOSIS — E119 Type 2 diabetes mellitus without complications: Secondary | ICD-10-CM

## 2012-03-04 DIAGNOSIS — S72002A Fracture of unspecified part of neck of left femur, initial encounter for closed fracture: Secondary | ICD-10-CM

## 2012-03-04 DIAGNOSIS — S72009A Fracture of unspecified part of neck of unspecified femur, initial encounter for closed fracture: Secondary | ICD-10-CM

## 2012-03-04 DIAGNOSIS — F200 Paranoid schizophrenia: Principal | ICD-10-CM

## 2012-03-04 DIAGNOSIS — R32 Unspecified urinary incontinence: Secondary | ICD-10-CM

## 2012-03-04 DIAGNOSIS — Z91199 Patient's noncompliance with other medical treatment and regimen due to unspecified reason: Secondary | ICD-10-CM

## 2012-03-04 HISTORY — DX: Schizophrenia, unspecified: F20.9

## 2012-03-04 HISTORY — DX: Mental disorder, not otherwise specified: F99

## 2012-03-04 MED ORDER — ATENOLOL 100 MG PO TABS: 100.0000 mg | ORAL_TABLET | Freq: Every day | ORAL | Status: DC

## 2012-03-04 MED ORDER — MENTHOL 3 MG MT LOZG
1.0000 | LOZENGE | OROMUCOSAL | Status: DC | PRN
  Administered 2012-03-26: 3 mg via ORAL

## 2012-03-04 MED ORDER — ARIPIPRAZOLE 5 MG PO TABS: 5.0000 mg | ORAL_TABLET | Freq: Every day | ORAL | Status: DC

## 2012-03-04 MED ORDER — BENZTROPINE MESYLATE 0.5 MG PO TABS: 0.5000 mg | ORAL_TABLET | Freq: Two times a day (BID) | ORAL | Status: DC | PRN

## 2012-03-04 MED ORDER — ALUM & MAG HYDROXIDE-SIMETH 200-200-20 MG/5ML PO SUSP
30.0000 mL | ORAL | Status: DC | PRN
  Administered 2012-03-16 – 2012-03-29 (×11): 30 mL via ORAL

## 2012-03-04 MED ORDER — AMLODIPINE BESYLATE 10 MG PO TABS
10.0000 mg | ORAL_TABLET | Freq: Every day | ORAL | Status: DC
  Administered 2012-03-05 – 2012-03-29 (×23): 10 mg via ORAL
  Filled 2012-03-04 (×28): qty 1

## 2012-03-04 MED ORDER — ARIPIPRAZOLE 5 MG PO TABS
5.0000 mg | ORAL_TABLET | Freq: Every day | ORAL | Status: DC
  Administered 2012-03-05 – 2012-03-06 (×2): 5 mg via ORAL
  Filled 2012-03-04 (×4): qty 1

## 2012-03-04 MED ORDER — ACETAMINOPHEN 325 MG PO TABS
650.0000 mg | ORAL_TABLET | Freq: Four times a day (QID) | ORAL | Status: DC | PRN
Start: 2012-03-04 — End: 2012-03-30

## 2012-03-04 MED ORDER — AMLODIPINE BESYLATE 10 MG PO TABS: 10.0000 mg | ORAL_TABLET | Freq: Every day | ORAL | Status: DC

## 2012-03-04 MED ORDER — ATENOLOL 100 MG PO TABS
100.0000 mg | ORAL_TABLET | Freq: Every day | ORAL | Status: DC
  Administered 2012-03-05 – 2012-03-29 (×23): 100 mg via ORAL
  Filled 2012-03-04 (×28): qty 1

## 2012-03-04 MED ORDER — MAGNESIUM HYDROXIDE 400 MG/5ML PO SUSP
30.0000 mL | Freq: Every day | ORAL | Status: DC | PRN
  Administered 2012-03-26: 30 mL via ORAL

## 2012-03-04 MED ORDER — HYDRALAZINE HCL 10 MG PO TABS
10.0000 mg | ORAL_TABLET | Freq: Four times a day (QID) | ORAL | Status: DC | PRN
  Administered 2012-03-09: 10 mg via ORAL
  Filled 2012-03-04: qty 1

## 2012-03-04 MED ORDER — HALOPERIDOL 5 MG PO TABS: 5.0000 mg | ORAL_TABLET | Freq: Every day | ORAL | Status: DC

## 2012-03-04 MED ORDER — ACETAMINOPHEN 325 MG PO TABS: 650.0000 mg | ORAL_TABLET | Freq: Four times a day (QID) | ORAL | Status: DC | PRN
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
## 2012-03-04 NOTE — Progress Notes (Signed)
Patient ID: David Lang, male   DOB: 1950-12-25, 62 y.o.   MRN: [id number]  D: Pt observed sleeping in bed with eyes closed. RR even and unlabored. No distress noted  .  A: Q 15 minute checks were done for safety.  R: safety maintained on unit.

## 2012-03-04 NOTE — Progress Notes (Signed)
Transport ([employer]) called for pt.
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
## 2012-03-04 NOTE — Progress Notes (Signed)
Internal Medicine Attending  Date: 03/04/2012  Patient name: David Lang Medical record number: [id number] Date of birth: [birth year] Age: 62 y.o. Gender: male  I saw and evaluated the patient. I reviewed the resident's note by Dr. Sherrine Maples and I agree with the resident's findings and plans as documented in her progress note.  [name] is unchanged this morning.  When I saw him he was resting comfortably in bed but he easily becomes agitated.  Apparently he has been accepted for treatment of his paranoid schizophrenia.  We will be sure to outline his medical regimen for his hypertension.  It is hoped that once his paranoid schizophrenia is treated he will be more compliant with his antihypertensive regimen.  Please remember, he has chronic hypertension which likely has not been treated for over a year.  Normal to low blood pressures obtained acutely will cause more harm than good.  Treat his chronic hypertension with that in mind, it is best (safest) treated as a chronic disease than an acute emergency.

## 2012-03-04 NOTE — Progress Notes (Addendum)
Clinical Social Worker received notification from Tim at [facility] that pt has been accepted and a bed is available in room number: 4051.  CSW submitted IVC paperwork to Tim.  CSW updated unit CSW, unit RNCM, and RN.  RN to phone report to [phone].  Pt to transport via [employer].   Angelia Mould, MSW, [place] [phone]

## 2012-03-05 ENCOUNTER — Encounter (HOSPITAL_COMMUNITY): Payer: Self-pay | Admitting: Psychiatry

## 2012-03-05 DIAGNOSIS — F200 Paranoid schizophrenia: Secondary | ICD-10-CM | POA: Diagnosis present

## 2012-03-05 MED ORDER — ASPIRIN EC 325 MG PO TBEC
325.0000 mg | DELAYED_RELEASE_TABLET | Freq: Every day | ORAL | Status: DC
  Administered 2012-03-05 – 2012-03-29 (×23): 325 mg via ORAL
  Filled 2012-03-05 (×30): qty 1

## 2012-03-05 MED ORDER — HALOPERIDOL 1 MG PO TABS
3.0000 mg | ORAL_TABLET | Freq: Two times a day (BID) | ORAL | Status: DC
  Administered 2012-03-05 – 2012-03-06 (×3): 3 mg via ORAL
  Filled 2012-03-05 (×6): qty 3

## 2012-03-05 MED ORDER — ASPIRIN EC 325 MG PO TBEC
DELAYED_RELEASE_TABLET | ORAL | Status: AC
  Administered 2012-03-05: 325 mg via ORAL
  Filled 2012-03-05: qty 1

## 2012-03-05 NOTE — Progress Notes (Signed)
Psychoeducational Group Note  Date:  03/05/2012 Time:  0945 am  Group Topic/Focus:  Identifying Needs:   The focus of this group is to help patients identify their personal needs that have been historically problematic and identify healthy behaviors to address their needs.  Participation Level:  Did Not Attend   Emelie Newsom J 03/05/2012,12:01 PM 

## 2012-03-05 NOTE — Clinical Social Work Note (Signed)
[facility] Group Notes:  (Clinical Social Work)  03/05/2012  11:15-11:45AM  Summary of Progress/Problems:   The main focus of today's process group was for the patient to identify ways in which they have in the past sabotaged their own recovery and reasons they may have done this/what they received from doing it.  Motivational interviewing techniques were utilized to explore motivations and plans to avoid self-sabotage when discharged from the hospital for this admission.  The patient expressed a good deal of anger, and was quite loud whenever he did speak in group.  He had been talking with a staff member about getting some American cereal, preferring Corn Flakes, and stated he had not yet gotten it.  He was agitated with CSW questions, stating he would do psychoanalysis on CSW.  We talked at some length about him being hungry, and how that can affect mood, and he agreed to go to the cafeteria for lunch.  He asked for somebody to take him by the arm to walk because his legs are weak.  Type of Therapy:  Group Therapy - Process  Participation Level:  Minimal  Participation Quality:  Drowsy and Resistant  Affect:  Defensive, Flat and Irritable  Cognitive:  Delusional  Insight:  None  Engagement in Therapy:  Off Topic  Modes of Intervention:  Clarification, Education, Limit-setting, Problem-solving, Socialization, Support and Processing, Exploration, Discussion   David Mantle, LCSW 03/05/2012, 1:09 PM

## 2012-03-05 NOTE — Progress Notes (Signed)
Patient ID: David Lang, male   DOB: [birth year], 62 y.o.   MRN: [id number] 03-05-12 nursing shift note: D: pt stated he couldn't come to the window for am medications. He did not participate in the milieu. His speech is very pressured. Pharmacy had problems trying to reconcile his medication. A: medications were taken to his room.  report was given that this pt had been non compliant with medications. He is a fall risk and a wheelchair is in his room   R: rn will continue to assessment this new admission. He stated he is very "tired". rn will monitor and q 15 min cks continue.

## 2012-03-05 NOTE — Progress Notes (Signed)
Psychoeducational Group Note  Date:  03/05/2012 Time:0930am  Group Topic/Focus:  Identifying Needs:   The focus of this group is to help patients identify their personal needs that have been historically problematic and identify healthy behaviors to address their needs.  Participation Level:  Did Not Attend  Participation Quality: Affect:  Cognitive:  Insight:  Engagement in Group: Additional Comments:  Inventory group   [name] 03/05/2012,10:10 AM

## 2012-03-05 NOTE — [facility] Suicide Risk Assessment (Signed)
Suicide Risk Assessment  Admission Assessment     Nursing information obtained from:  Patient;Review of record Demographic factors:  Male;Living alone;Unemployed Current Mental Status:  NA Loss Factors:  NA Historical Factors:  Family history of mental illness or substance abuse;Impulsivity Risk Reduction Factors:  NA  CLINICAL FACTORS:   Schizophrenia:   Paranoid or undifferentiated type  COGNITIVE FEATURES THAT CONTRIBUTE TO RISK:  Closed-mindedness Loss of executive function Thought constriction (tunnel vision)    SUICIDE RISK:   Moderate:  Frequent suicidal ideation with limited intensity, and duration, some specificity in terms of plans, no associated intent, good self-control, limited dysphoria/symptomatology, some risk factors present, and identifiable protective factors, including available and accessible social support.  PLAN OF CARE: Supportive approach/coping skills/improve reality testing                               Optimize treatment with antipsychotic agents  I certify that inpatient services furnished can reasonably be expected to improve the patient's condition.  Coley Littles A 03/05/2012, 3:45 PM

## 2012-03-05 NOTE — Clinical Social Work Psychosocial (Signed)
CSW attempted to perform Psychosocial Assessment with patient, who was initially agreeable.  He stated that he is in the hospital due to a conspiracy by his family.  When the questions from the PSA were started, he refused to answer each of the first three, stating he does not need help "from any government anywhere."    Future attempts to be made by CSWs.  Ambrose Mantle, LCSW 8:46 AM

## 2012-03-05 NOTE — H&P (Signed)
Psychiatric Admission Assessment Adult  Patient Identification:  David Lang  Date of Evaluation:  03/05/2012  Chief Complaint:  Schizophrenia  History of Present Illness: This is a 62 year old [place] male, admitted to [facility] from the [facility].  Apparently per documentation, patient is with long history of schizophrenia. He is currently uncooperative with staff during assessment. He appears to be disorganized, illogical, speech is rapid with frequent outburst, does not follow direction. He is delusional and paranoid. When asked what were the reasons that he was hospitalized, patient reports with an accent, "You are all messing with my mind. I don't need medical care. If I need medical care, I go to my people. I know all of your problems. I can make reports on you. I was brought here by the police. I live in [place]. I don't want to tell you anything about myself. I don't deal with my family. They messed me up in [place]. I was born in [place], but I consider myself unofficial. Everywhere I go, I'm unofficial, but I consider myself official too. I'm here because the hospital said that I need to be here".   Elements:  Location:  [facility] adult unit. Quality:  Per observation; restless, angry outburst, uncooperative, refuses to provide information. Severity:  Severe. Timing:  Unable to determine at this time.. Duration:  Probable chronic mental illness.. Context:  Restlessness, manic, angry outburst, refusal to cooperate or answer questions, rapid speech..  Associated Signs/Synptoms:  Depression Symptoms:  psychomotor agitation, difficulty concentrating,  (Hypo) Manic Symptoms:  Distractibility, Elevated Mood, Flight of Ideas, Grandiosity, Irritable Mood,  Anxiety Symptoms:  Restless.  Psychotic Symptoms:  Delusions, Ideas of Reference, Paranoia,  PTSD Symptoms: Had a traumatic exposure:  Difficult to assess due to patient's current manic state.  Psychiatric Specialty  Exam: Physical Exam  Nursing note reviewed. Constitutional: He appears well-developed.  Eyes: Pupils are equal, round, and reactive to light.  Neck:  Unable to assess due patient is uncooperative.  Cardiovascular: Normal rate.   Per documented vital signs report  Respiratory: Effort normal.  GI:  Unable to assess this time, patient is uncooperative.  Genitourinary:  Not assessed.  Neurological: He is alert.  Skin: Skin is warm and dry.  Psychiatric: His affect is angry, blunt and inappropriate. His speech is rapid and/or pressured and tangential. He is agitated, aggressive and hyperactive. Thought content is paranoid and delusional. Cognition and memory are impaired. He expresses inappropriate judgment. He expresses no homicidal and no suicidal ideation. He expresses no suicidal plans and no homicidal plans.    Review of Systems  Constitutional: Negative.        Per documented vital signs.  HENT:       Patient declined to provided any useful information pertaining to his physical and mental health.  Respiratory: Negative.        Patient appears to be in no respiratory distress.   Skin:       Patient is disheveled, generally dirty, malodorous, poor dentition, poor self care, long hair, long beard, halitosis.  Psychiatric/Behavioral:       Patient appears paranoid and  delusional    Blood pressure 108/66, pulse 70, temperature 98 F (36.7 C), temperature source Oral, resp. rate 20, height 5' 9.5" (1.765 m), weight 80.74 kg (178 lb).Body mass index is 25.92 kg/(m^2).  General Appearance: Disheveled and malodorous  Eye Contact::  Poor  Speech:  Pressured  Volume:  Increased  Mood:  Angry, Dysphoric and Irritable manic  Affect:  Inappropriate  Thought Process:  Disorganized, Irrelevant, Tangential and illogical  Orientation:  Other:  Difficult to determine, patient declines to provide any useful information.  Thought Content:  Delusions, Ideas of Reference:   Paranoia Delusions  and Paranoid Ideation  Suicidal Thoughts:  Unable to determine, patient is not answering questions  Homicidal Thoughts:  Unable to determine, patient is not answering questions  Memory:  Very difficult to assess.  Judgement:  Impaired  Insight:  Lacking and not present  Psychomotor Activity:  Increased, Restlessness and angry  Concentration:  Poor  Recall:  Poor  Akathisia:  No  Handed:  Right  AIMS (if indicated):     Assets:  Others:  Unable to determine.  Sleep:  Number of Hours: 4.5    Past Psychiatric History: Diagnosis: Schizophrenia, paranoia-type.  Hospitalizations: [facility]  Outpatient Care: None reported  Substance Abuse Care: None reported  Self-Mutilation: Declined to provide any information.  Suicidal Attempts: Declined to provided any useful information.  Violent Behaviors: Unable to determine   Past Medical History:   Past Medical History  Diagnosis Date  . Hypertension   . Diabetes mellitus without complication   . Mental disorder    Cardiac History:  Hx high blood pressure.  Allergies:  No Known Allergies  PTA Medications: Prescriptions prior to admission  Medication Sig Dispense Refill  . acetaminophen (TYLENOL) 325 MG tablet Take 2 tablets (650 mg total) by mouth every 6 (six) hours as needed (or Fever >/= 101).      Marland Kitchen amLODipine (NORVASC) 10 MG tablet Take 1 tablet (10 mg total) by mouth daily.      . ARIPiprazole (ABILIFY) 5 MG tablet Take 1 tablet (5 mg total) by mouth daily.      Marland Kitchen aspirin EC 81 MG tablet Take 81 mg by mouth daily.      Marland Kitchen atenolol (TENORMIN) 100 MG tablet Take 1 tablet (100 mg total) by mouth daily.      . benztropine (COGENTIN) 0.5 MG tablet Take 1 tablet (0.5 mg total) by mouth 2 (two) times daily as needed (Give PRN for any muscle regidity, restlessness, inability to stop moving as side effect of Abilify aripiprazole).      . haloperidol (HALDOL) 5 MG tablet Take 1 tablet (5 mg total) by mouth daily at 8 pm.        Previous  Psychotropic Medications:  Medication/Dose  See medication lists above.               Substance Abuse History in the last 12 months:  Unsure, however, UDS report is clear.  Consequences of Substance Abuse: Medical Consequences:  Liver damage, Possible death by overdose Legal Consequences:  Arrests, jail time, Loss of driving privilege. Family Consequences:  Family discord, divorce and or separation.  Social History:  reports that he has never smoked. He does not have any smokeless tobacco history on file. He reports that he does not drink alcohol or use illicit drugs. Additional Social History: History of alcohol / drug use?: No history of alcohol / drug abuse  Current Place of Residence:  [place], [place]  Place of Birth: [place].   Family Members: "I don't deal with my family no more"  Marital Status:  "I don't tell you about myself"  Children: No answer provided   Sons: No answer provided  Daughters: No answer provided  Relationships: "I don't tell you about my business"  Education:  No answer provided by the patient.  Educational Problems/Performance: No answer provided  Religious Beliefs/Practices: No answer provided  History of Abuse (Emotional/Phsycial/Sexual): declined to answer this question.  Occupational Experiences: Declined to answer any questions.  Military History:  Patient declined to provide any information.  Legal History: "I'm not official".  Hobbies/Interests: Declined to provide any information.  Family History:  History reviewed. No pertinent family history.  No results found for this or any previous visit (from the past 72 hour(s)). Psychological Evaluations:  Assessment:   AXIS I:  Schizophrenia, paranoia-type. AXIS II:  Deferred AXIS III:   Past Medical History  Diagnosis Date  . Hypertension   . Diabetes mellitus without complication   . Mental disorder    AXIS IV:  other psychosocial or environmental problems and problems  with primary support group AXIS V:  21-30 behavior considerably influenced by delusions or hallucinations OR serious impairment in judgment, communication OR inability to function in almost all areas  Treatment Plan/Recommendations: 1. Admit for crisis management and stabilization, estimated length of stay 5-7 days.  2. Medication management to reduce current symptoms to base line and improve the patient's overall level of functioning (A) Start haldol 3 mg bid.                                 (Obtain a repeat CMP). 3. Treat health problems as indicated.  4. Develop treatment plan to decrease risk of and the need for readmission.  5. Psycho-social education regarding health maintenance and self care.  6. Health care follow up as needed for medical problems.  7. Review, reconcile, and reinstate any pertinent home medications for other health issues where appropriate. 8. Call for consults with hospitalist for any additional specialty patient care services as needed.   Treatment Plan Summary: Daily contact with patient to assess and evaluate symptoms and progress in treatment Medication management  Current Medications:  Current Facility-Administered Medications  Medication Dose Route Frequency Provider Last Rate Last Dose  . acetaminophen (TYLENOL) tablet 650 mg  650 mg Oral Q6H PRN Shuvon Rankin, NP      . alum & mag hydroxide-simeth (MAALOX/MYLANTA) 200-200-20 MG/5ML suspension 30 mL  30 mL Oral Q4H PRN Shuvon Rankin, NP      . amLODipine (NORVASC) tablet 10 mg  10 mg Oral Daily Shuvon Rankin, NP   10 mg at 03/05/12 0806  . ARIPiprazole (ABILIFY) tablet 5 mg  5 mg Oral Daily Shuvon Rankin, NP   5 mg at 03/05/12 0806  . atenolol (TENORMIN) tablet 100 mg  100 mg Oral Daily Shuvon Rankin, NP   100 mg at 03/05/12 0805  . benztropine (COGENTIN) tablet 0.5 mg  0.5 mg Oral BID PRN Shuvon Rankin, NP      . hydrALAZINE (APRESOLINE) tablet 10 mg  10 mg Oral Q6H PRN Shuvon Rankin, NP      . magnesium  hydroxide (MILK OF MAGNESIA) suspension 30 mL  30 mL Oral Daily PRN Shuvon Rankin, NP      . menthol-cetylpyridinium (CEPACOL) lozenge 3 mg  1 lozenge Oral PRN Shuvon Rankin, NP        Observation Level/Precautions:  15 minute checks  Laboratory:  Obtain CMP  Psychotherapy:  Group sessions.  Medications:  See medication lists  Consultations: None indicated at this time.   Discharge Concerns:  Safety/stabilization.  Estimated LOS: 5-7 days.  Other:     I certify that inpatient services furnished can reasonably be expected to improve the patient's condition.  Armandina Stammer I 2/8/20149:20 AM

## 2012-03-06 NOTE — Progress Notes (Signed)
Patient ID: David Lang, male   DOB: 02/22/1950, 62 y.o.   MRN: [id number] D. The patient spent the evening in bed. Appears to be responding to A/V hallucinations. He was observed talking and yelling loudly to himself. Refused offer of Ensure and turned is head away refusing to talk to staff when approached. A. Attempt made to engage the patient in a 1:1 conversation. Encouraged to get out of bed to attend evening group. Offered and refused Ensure or any other snack/drink. Encouraged to ask staff for assistance when getting out of bed. R. The patient did not attend group and did not want to talk with staff. Will continue to monitor.

## 2012-03-06 NOTE — Progress Notes (Signed)
Patient ID: David Lang, male   DOB: Jun 23, 1950, 62 y.o.   MRN: [id number] 03-06-12 @ 1045 nursing shift note: D: he has been verbally aggressive this am. A: RN sat with pt for about 20 min and explained to him that he needs to be respectful of all staff on the unit. He took all his medications except his aspirin with a great deal of encouragement. R: pt apologized for being disrespectful.  RN will continue to monitor, praise and redirect this patient when appropriate. Q 15 min ck's continue.

## 2012-03-06 NOTE — Progress Notes (Signed)
[facility] Group Notes:  (Nursing/MHT/Case Management/Adjunct)  Date:  03/06/2012  Time:  2:33 PM  Type of Therapy:  MHT Group  Participation Level:  Did Not Attend   Summary of Progress/Problems:  David Lang 03/06/2012, 2:33 PM

## 2012-03-06 NOTE — Discharge Summary (Signed)
David Lang paranoid schizophrenia prohibited him from cooperating with his medical therapy.  He has been non-compliant with his medications for over 1 year as he has not refilled them at his pharmacy.  He was also uncooperative with blood draws.  As he was felt to have pre-renal azotemia secondary to dehydration from poor oral intake and his oral intake improved markedly during the hospitalization, his pre-renal azotemia likely also improved.  We felt that a repeat BMP would not change his management and was therefore not necessary.  We were able to negotiate with him to take his antihypertensives and the doses were adjusted to get decent control of his blood pressure.  Once his paranoid schizophrenia has improved, his diabetes and renal function can be reassessed and chronically intervened upon if necessary.  As these are chronic conditions that require compliance with medical therapy, the more pressing issue in the management of David Lang health is getting control of his paranoid schizophrenia.  He was therefore transferred to [facility].

## 2012-03-06 NOTE — Progress Notes (Signed)
Patient ID: Zadkiel Dragan, male   DOB: 16-May-1950, 62 y.o.   MRN: [id number] Psychoeducational Group Note  Date:  03/06/2012 Time:  1000am  Group Topic/Focus:  Making Healthy Choices:   The focus of this group is to help patients identify negative/unhealthy choices they were using prior to admission and identify positive/healthier coping strategies to replace them upon discharge.  Participation Level:  Did Not Attend  Participation Quality:    Affect:  Cognitive:  Insight:   Engagement in Group: Additional Comments: inventory group- pt is medicated unable to attend   [name] 03/06/2012,9:51 AM

## 2012-03-06 NOTE — Progress Notes (Signed)
Nutrition Brief Note  Patient identified on the Malnutrition Screening Tool (MST) Report  Body mass index is 25.92 kg/(m^2). Patient's weight WNL based on current BMI.   Current diet order is reg, patient reports that he is eating well. Labs and medications reviewed.   Patient states that he has lost weight over the last 7 years.  He used to cook from scratch but now cannot.  "everything turns out bad and has to be thrown away."  Asked for milk which tech provided.  When I would not sit down stay longer, patient stated, "You won't listen, you flag and your country depend on it."  No nutrition interventions warranted at this time. If nutrition issues arise, please consult RD.   Oran Rein, RD, LDN Clinical Inpatient Dietitian Pager:  [phone] Weekend and after hours pager:  [phone]

## 2012-03-06 NOTE — Progress Notes (Signed)
Psychoeducational Group Note  Date:  03/06/2012 Time:  0945 am  Group Topic/Focus:  Making Healthy Choices:   The focus of this group is to help patients identify negative/unhealthy choices they were using prior to admission and identify positive/healthier coping strategies to replace them upon discharge.  Participation Level:  Did Not Attend  Brooklyne Radke J 03/06/2012, 10:29 AM 

## 2012-03-06 NOTE — Progress Notes (Signed)
Patient ID: David Lang, male   DOB: [birth year], 62 y.o.   MRN: [id number] D. The patient spent the evening resting in bed. Speech is very loud and pressured. Refused evening Ensure requesting instead "butter water" because he was thirsty. Became agitated when offered a pitcher of water and told that was the only type of water that we had here in the hospital. He sat up in bed made a fist and raised his arm in a threatening manner yelling, "Go to [employer] and get it. I have money. Put it on my bill."  A. Attempt made to engage patient in a 1:1 conversation. Offered his evening Ensure. Encouraged to attend evening group. R. Stated he did not want to attend group or talk to staff. Became agitated and shouted loudly for staff to get out of his room.

## 2012-03-06 NOTE — Progress Notes (Signed)
[facility] MD Progress Note  03/06/2012 1:31 PM David Lang  MRN:  [id number]  Subjective:  "Are you coming to offer me sweets? I like chocolates. I want somebody to come here and go to the market and get me some chocolate. I want 7 musketeers, 7 Kitcat, 7 hushey's, no kisses and 3 what ????. My life don't concern you. My blood don't concern you.  My life concerns my people, [place] and Muslim people".  Diagnosis:   Axis I: Schizophrenia, paranoia type. Axis II: Deferred Axis III:  Past Medical History  Diagnosis Date  . Hypertension   . Diabetes mellitus without complication   . Mental disorder    Axis IV: other psychosocial or environmental problems Axis V: 21-30 behavior considerably influenced by delusions or hallucinations OR serious impairment in judgment, communication OR inability to function in almost all areas  ADL's:  Impaired  Sleep: 2.5 per documentation.  Appetite:  Fair  Suicidal Ideation:  Unable to assess, patient is disruptive. Homicidal Ideation:  Unable to assess, patient is disruptive.  AEB (as evidenced by): patient's verbal reactions to assessment.  Psychiatric Specialty Exam: Review of Systems  Unable to perform ROS: mental acuity    Blood pressure 111/69, pulse 80, temperature 98.3 F (36.8 C), temperature source Oral, resp. rate 20, height 5' 9.5" (1.765 m), weight 80.74 kg (178 lb).Body mass index is 25.92 kg/(m^2).  General Appearance: Disheveled  Eye Contact::  Poor  Speech:  Clear and Coherent and Pressured  Volume:  Increased  Mood:  Dysphoric and outburst, yelling  Affect:  Inappropriate  Thought Process:  Disorganized and incoherent,   Orientation:  Other:  Unable to assess   Thought Content:  Delusions and Paranoid Ideation  Suicidal Thoughts:  Unable to assess, patient is disruptive  Homicidal Thoughts:  Unable to assess, patient is disruptive  Memory:  unable to assess, patient is disruptive.  Judgement:  Impaired  Insight:   Not present  Psychomotor Activity:  Increased, Restlessness and agitated  Concentration:  Poor  Recall:  Unable to assess, patient is disruptive.  Akathisia:  No  Handed:  Right  AIMS (if indicated):     Assets:  Others:  Unable to assess, patient is disruptive.  Sleep:  Number of Hours: 2.25   Current Medications: Current Facility-Administered Medications  Medication Dose Route Frequency Provider Last Rate Last Dose  . acetaminophen (TYLENOL) tablet 650 mg  650 mg Oral Q6H PRN Shuvon Rankin, NP      . alum & mag hydroxide-simeth (MAALOX/MYLANTA) 200-200-20 MG/5ML suspension 30 mL  30 mL Oral Q4H PRN Shuvon Rankin, NP      . amLODipine (NORVASC) tablet 10 mg  10 mg Oral Daily Shuvon Rankin, NP   10 mg at 03/06/12 0823  . ARIPiprazole (ABILIFY) tablet 5 mg  5 mg Oral Daily Shuvon Rankin, NP   5 mg at 03/06/12 0824  . aspirin EC tablet 325 mg  325 mg Oral Daily Sanjuana Kava, NP   325 mg at 03/05/12 1153  . atenolol (TENORMIN) tablet 100 mg  100 mg Oral Daily Shuvon Rankin, NP   100 mg at 03/06/12 0824  . benztropine (COGENTIN) tablet 0.5 mg  0.5 mg Oral BID PRN Shuvon Rankin, NP      . haloperidol (HALDOL) tablet 3 mg  3 mg Oral BID Sanjuana Kava, NP   3 mg at 03/06/12 4098  . hydrALAZINE (APRESOLINE) tablet 10 mg  10 mg Oral Q6H PRN Shuvon Rankin, NP      .  magnesium hydroxide (MILK OF MAGNESIA) suspension 30 mL  30 mL Oral Daily PRN Shuvon Rankin, NP      . menthol-cetylpyridinium (CEPACOL) lozenge 3 mg  1 lozenge Oral PRN Shuvon Rankin, NP        Lab Results: No results found for this or any previous visit (from the past 48 hour(s)).  Physical Findings: AIMS: Facial and Oral Movements Muscles of Facial Expression: None, normal Lips and Perioral Area: None, normal Jaw: None, normal Tongue: None, normal,Extremity Movements Upper (arms, wrists, hands, fingers): None, normal Lower (legs, knees, ankles, toes): None, normal, Trunk Movements Neck, shoulders, hips: None, normal, Overall  Severity Severity of abnormal movements (highest score from questions above): None, normal Incapacitation due to abnormal movements: None, normal Patient's awareness of abnormal movements (rate only patient's report): No Awareness, Dental Status Current problems with teeth and/or dentures?: Yes Does patient usually wear dentures?: No  CIWA:    COWS:     Treatment Plan Summary: Daily contact with patient to assess and evaluate symptoms and progress in treatment Medication management  Plan: Supportive approach/coping skills/stabiliztion Decrease stimulation. Will continue to monitor response to/adverse effects of medications in use to assure effectiveness. Continue to monitor mood, behavior and interaction with staff and other patients. Continue current plan of care.  Medical Decision Making Problem Points:  Established problem, worsening (2), Review of last therapy session (1) and Review of psycho-social stressors (1) Data Points:  Review of medication regiment & side effects (2) Review of new medications or change in dosage (2)  I certify that inpatient services furnished can reasonably be expected to improve the patient's condition.   Armandina Stammer I 03/06/2012, 1:31 PM

## 2012-03-06 NOTE — Clinical Social Work Note (Signed)
[facility] Group Notes: (Clinical Social Work)   03/06/2012      Type of Therapy:  Group Therapy   Participation Level:  Did Not Attend    Aking Klabunde Grossman-Orr, LCSW 03/06/2012, 12:50 PM     

## 2012-03-07 MED ORDER — METFORMIN HCL 500 MG PO TABS: 500.0000 mg | ORAL_TABLET | Freq: Two times a day (BID) | ORAL | Status: DC

## 2012-03-07 MED ORDER — POTASSIUM CHLORIDE CRYS ER 20 MEQ PO TBCR
20.0000 meq | EXTENDED_RELEASE_TABLET | Freq: Every day | ORAL | Status: AC
  Administered 2012-03-08 – 2012-03-10 (×3): 20 meq via ORAL
  Filled 2012-03-07 (×3): qty 1

## 2012-03-07 MED ORDER — LORAZEPAM 1 MG PO TABS
2.0000 mg | ORAL_TABLET | Freq: Four times a day (QID) | ORAL | Status: DC | PRN
  Administered 2012-03-09 – 2012-03-26 (×6): 2 mg via ORAL
  Filled 2012-03-07: qty 1
  Filled 2012-03-07 (×2): qty 2
  Filled 2012-03-07: qty 1
  Filled 2012-03-07 (×4): qty 2
  Filled 2012-03-07: qty 1
  Filled 2012-03-07: qty 2

## 2012-03-07 MED ORDER — HALOPERIDOL 5 MG PO TABS
5.0000 mg | ORAL_TABLET | Freq: Four times a day (QID) | ORAL | Status: DC | PRN
  Filled 2012-03-07: qty 2

## 2012-03-07 MED ORDER — BENZTROPINE MESYLATE 1 MG PO TABS: 1.0000 mg | ORAL_TABLET | Freq: Two times a day (BID) | ORAL | Status: DC | PRN

## 2012-03-07 MED ORDER — DIPHENHYDRAMINE HCL 25 MG PO CAPS: 25.0000 mg | ORAL_CAPSULE | Freq: Every evening | ORAL | Status: DC | PRN

## 2012-03-07 MED ORDER — HALOPERIDOL 5 MG PO TABS
5.0000 mg | ORAL_TABLET | Freq: Two times a day (BID) | ORAL | Status: DC
  Administered 2012-03-07 – 2012-03-09 (×4): 5 mg via ORAL
  Filled 2012-03-07 (×6): qty 1

## 2012-03-07 NOTE — Progress Notes (Signed)
Pt in room resting in bed. Pt offered 8 morning meds, pt refused to take meds. Pt stated to MHT "she's a young black nurse, I need to see authorization on letter head paper". Writer informed pt that the MD has already authorized for him to take the medications ordered. Pt continued to yell at [employer]. Pt held the pills in his hand and stated that he was going to take the meds but continued to request to see an authorization form. Writer asked pt several times to take the meds or give them back. Pt refused to take meds but continued to hold onto the meds. MHT had to explain to the pt again the reason why he cannot hold onto the meds and after several times of having to redirect the pt he finally gave the meds back to Clinical research associate.   Medications offered as ordered per MD. Verbal support given. 15 minute checks performed for safety. Shift assessment performed.  Pt is irritable. Pt presents will disorganized thinking, paranoia and is noncompliant with treatment. Pt presents with moderate confusion. Pt remains safe at this time.

## 2012-03-07 NOTE — Clinical Social Work Note (Signed)
  Type of Therapy: Process Group Therapy  Participation Level:  None  Participation Quality:  Drowsy  Affect:  Irritable  Cognitive:  Confused  Insight:  None  Engagement in Group:  None  Engagement in Therapy:  None  Modes of Intervention:  Activity, Clarification, Education, Problem-solving and Support  Summary of Progress/Problems: Today's group addressed the issue of overcoming obstacles.  Patients were asked to identify their biggest obstacle post d/c that stands in the way of their on-going success, and then problem solve as to how to manage this. Fred refused to answer any questions initially.  Then stated he felt drowsy, but preferred to nap in group rather than go back to his room.  Napped for the test of group.       Daryel Gerald B 03/07/2012   3:03 PM

## 2012-03-07 NOTE — Progress Notes (Signed)
Patient ID: David Lang, male   DOB: [birth year], 62 y.o.   MRN: [id number] [facility] MD Progress Note  03/07/2012 10:45 AM David Lang  MRN:  [id number]  Subjective: Patient is easily agitated, rambling,labile and delusional. He says; "I need authorization from the [place], and [place] presidents before I can talk to you". Patient is non-compliant with his medications and has refused to allow provider to contact his families.  Diagnosis:   Axis I: Schizophrenia, paranoia type. Axis II: Deferred Axis III:  Past Medical History  Diagnosis Date  . Hypertension   . Diabetes mellitus without complication    Axis IV: other psychosocial or environmental problems Axis V: 21-30 behavior considerably influenced by delusions or hallucinations OR serious impairment in judgment, communication OR inability to function in almost all areas  ADL's:  Impaired  Sleep: 2.25 per documentation  Appetite:  Fair  Suicidal Ideation:  Unable to assess, patient refused to answer. Homicidal Ideation:  Unable to assess, patient refused to answer  AEB (as evidenced by): patient's verbal reactions to assessment.  Psychiatric Specialty Exam: Review of Systems  Unable to perform ROS: mental acuity    Blood pressure 111/69, pulse 80, temperature 98.3 F (36.8 C), temperature source Oral, resp. rate 20, height 5' 9.5" (1.765 m), weight 80.74 kg (178 lb).Body mass index is 25.92 kg/(m^2).  General Appearance: Disheveled  Eye Contact::  Poor  Speech:  Clear and Coherent, angry and Pressured  Volume:  Increased  Mood:  Labile, angry, outburst, yelling  Affect:  Inappropriate  Thought Process:  Disorganized and incoherent,   Orientation: Unable to assess   Thought Content:  Delusions and Paranoid Ideation  Suicidal Thoughts:  Unable to assess, patient is uncooperative  Homicidal Thoughts:  Unable to assess, patient is uncooperative  Memory:  unable to assess, patient is uncooperative   Judgement:  Impaired  Insight:  Not present  Psychomotor Activity:  Increased, Restlessness and agitated  Concentration:  Poor  Recall:  Unable to assess, patient is uncooperative  Akathisia:  No  Handed:  Right  AIMS (if indicated):     Assets:  Others:  Unable to assess, patient is uncooperative  Sleep:  Number of Hours: 2.25   Current Medications: Current Facility-Administered Medications  Medication Dose Route Frequency Provider Last Rate Last Dose  . acetaminophen (TYLENOL) tablet 650 mg  650 mg Oral Q6H PRN Shuvon Rankin, NP      . alum & mag hydroxide-simeth (MAALOX/MYLANTA) 200-200-20 MG/5ML suspension 30 mL  30 mL Oral Q4H PRN Shuvon Rankin, NP      . amLODipine (NORVASC) tablet 10 mg  10 mg Oral Daily Shuvon Rankin, NP   10 mg at 03/06/12 0823  . aspirin EC tablet 325 mg  325 mg Oral Daily Sanjuana Kava, NP   325 mg at 03/05/12 1153  . atenolol (TENORMIN) tablet 100 mg  100 mg Oral Daily Shuvon Rankin, NP   100 mg at 03/06/12 0824  . benztropine (COGENTIN) tablet 1 mg  1 mg Oral BID PRN Ellyson Rarick      . diphenhydrAMINE (BENADRYL) capsule 25 mg  25 mg Oral QHS PRN Harrison Zetina      . haloperidol (HALDOL) tablet 5 mg  5 mg Oral BID Oluwaferanmi Wain      . haloperidol (HALDOL) tablet 5 mg  5 mg Oral Q6H PRN Yobani Schertzer      . hydrALAZINE (APRESOLINE) tablet 10 mg  10 mg Oral Q6H PRN Shuvon Rankin, NP      .  LORazepam (ATIVAN) tablet 2 mg  2 mg Oral Q6H PRN Anaiz Qazi      . magnesium hydroxide (MILK OF MAGNESIA) suspension 30 mL  30 mL Oral Daily PRN Shuvon Rankin, NP      . menthol-cetylpyridinium (CEPACOL) lozenge 3 mg  1 lozenge Oral PRN Shuvon Rankin, NP      . metFORMIN (GLUCOPHAGE) tablet 500 mg  500 mg Oral BID WC Burt Piatek        Lab Results: No results found for this or any previous visit (from the past 48 hour(s)).  Physical Findings: AIMS: Facial and Oral Movements Muscles of Facial Expression: None, normal Lips and Perioral Area: None,  normal Jaw: None, normal Tongue: None, normal,Extremity Movements Upper (arms, wrists, hands, fingers): None, normal Lower (legs, knees, ankles, toes): None, normal, Trunk Movements Neck, shoulders, hips: None, normal, Overall Severity Severity of abnormal movements (highest score from questions above): None, normal Incapacitation due to abnormal movements: None, normal Patient's awareness of abnormal movements (rate only patient's report): No Awareness, Dental Status Current problems with teeth and/or dentures?: Yes Does patient usually wear dentures?: No  CIWA:    COWS:     Treatment Plan Summary: Daily contact with patient to assess and evaluate symptoms and progress in treatment Medication management  Plan: Supportive approach/coping skills/stabiliztion Decrease stimulation. Will continue to monitor response to/adverse effects of medications in use to assure effectiveness. Continue to monitor mood, behavior and interaction with staff and other patients. Increase Haldol to 5mg  po BID for delusions Initiate Trileptal 150mg  BID for labile mood Cogentin 1mg  po BID for EPS prevention. Will request second physician evaluation for forced medication due to refusal to accept oral medication and patient acuity.  Medical Decision Making Problem Points:  Established problem, worsening (2), Review of last therapy session (1) and Review of psycho-social stressors (1) Data Points:  Review of medication regiment & side effects (2) Review of new medications or change in dosage (2)  I certify that inpatient services furnished can reasonably be expected to improve the patient's condition.   Jefferey Lippmann,MD 03/07/2012, 10:45 AM

## 2012-03-07 NOTE — Progress Notes (Signed)
  Psychoeducational Group Note  Date:  03/07/2012 Time:  2005 Group Topic/Focus:  Wrap-Up Group:   The focus of this group is to help patients review their daily goal of treatment and discuss progress on daily workbooks.  Participation Level: Did Not Attend  Participation Quality:  Not Applicable  Affect:  Not Applicable  Cognitive:  Not Applicable  Insight:  Not Applicable  Engagement in Group: Not Applicable  Additional Comments:  Pt. Refused to attend group  David Lang Patience 03/07/2012, 10:57 PM

## 2012-03-07 NOTE — Treatment Plan (Signed)
Interdisciplinary Treatment Plan Update (Adult)  Date: 03/07/2012  Time Reviewed: 8:10 AM   Progress in Treatment: Attending groups: No Participating in groups: No Taking medication as prescribed: No Tolerating medication: No  Family/Significant other contact made:  No Patient understands diagnosis:  No No insight Discussing patient identified problems/goals with staff:  Yes  See below Medical problems stabilized or resolved:  Yes Denies suicidal/homicidal ideation: Yes  In tx team Issues/concerns per patient self-inventory:  Not filled out Other:  New problem(s) identified: N/A  Reason for Continuation of Hospitalization: Hallucinations Medication stabilization  Interventions implemented related to continuation of hospitalization: Get second opinion to force meds  Additional comments:  Estimated length of stay: 5-10 days  Discharge Plan: return home, follow up outpt  New goal(s): N/A  Review of initial/current patient goals per problem list:   1.  Goal(s): Eliminate psychosis  Met:  No  Target date:2/14  As evidenced by:Today David Lang declares that there is no reason for him to be here, that there is nothing wrong with him, and that we should call the president of [place] and the [place] to negotiate his release.  He is loud and easily agitated.    2.  Goal (s): David Lang will take medication as prescribed  Met:  No  Target date:2/14  As evidenced [id number] for second opinion today to force meds  3.  Goal(s): Identify outpt provider  Met:  No  Target date:2/14  As evidenced [id number] of appointment time and date  4.  Goal(s):  Met:  No  Target date:  As evidenced by:  Attendees: Patient:     Family:     Physician:  Thedore Mins 03/07/2012 8:10 AM   Nursing:  Liborio Nixon  03/07/2012 8:10 AM   Clinical Social Worker:  Richelle Ito 03/07/2012 8:10 AM   Extender:  Verne Spurr PA 03/07/2012 8:10 AM   Other:     Other:     Other:     Other:      Scribe  for Treatment Team:   Ida Rogue, 03/07/2012 8:10 AM

## 2012-03-07 NOTE — Progress Notes (Signed)
Recreation Therapy Notes  03/07/2012         Time: 9:30am       Group Topic/Focus: Exercise Education/Wellness   Participation Level: Did not attend    [employer], LRT/CTRS   Jearl Klinefelter 03/07/2012 12:03 PM

## 2012-03-07 NOTE — Progress Notes (Signed)
[facility] LCSW Aftercare Discharge Planning Group Note  03/07/2012 9:48 AM  Participation Quality:  Did not attend     Ida Rogue 03/07/2012, 9:48 AM

## 2012-03-08 MED ORDER — DIPHENHYDRAMINE HCL 50 MG/ML IJ SOLN
50.0000 mg | Freq: Once | INTRAMUSCULAR | Status: DC
  Filled 2012-03-08: qty 1

## 2012-03-08 MED ORDER — LORAZEPAM 2 MG/ML IJ SOLN: 1.0000 mg | Freq: Once | INTRAMUSCULAR | Status: DC

## 2012-03-08 MED ORDER — HALOPERIDOL LACTATE 5 MG/ML IJ SOLN
10.0000 mg | Freq: Once | INTRAMUSCULAR | Status: DC
  Filled 2012-03-08: qty 2

## 2012-03-08 NOTE — Progress Notes (Signed)
Psychoeducational Group Note  Date:  03/08/2012 Time:  2000  Group Topic/Focus:  Wrap-Up Group:   The focus of this group is to help patients review their daily goal of treatment and discuss progress on daily workbooks.  Participation Level: Did Not Attend  Participation Quality:  Not Applicable  Affect:  Not Applicable  Cognitive:  Not Applicable  Insight:  Not Applicable  Engagement in Group: Not Applicable  Additional Comments:  Pt did not attend.  Christ Kick 03/08/2012, 9:05 PM

## 2012-03-08 NOTE — Progress Notes (Addendum)
Patient ID: David Lang, male   DOB: [birth year], 62 y.o.   MRN: [id number]  D: Pt denies SI/HI/AVH. Pt is is loud and argumentative. Pt continues to be paranoid, tangential, and labile. Pt remains suspicious, but seems lonely. Pt spends a lot of time in room, but offered to writer to sit and talk for awhile.  Pt states " don't be concerned with me".   A: Pt was offered support and encouragement.  Pt was encourage to attend groups. Q 15 minute checks were done for safety.   R:Pt does not attend groups and does not  interact well with peers and staff. Pt is taking medication.Pt is not very receptive to treatment. safety maintained on unit.

## 2012-03-08 NOTE — Progress Notes (Signed)
D: Pt is MODERATE FALL RISK.    This RN checked in with Pt several times during evening; each time, Pt was extremely hostile and demanding, ordering writer to perform tasks in loud, argumentative tone within which the actual words were often incomprehensible.  Stated at one point "If you do not bring me what I want, one will suffer- the weather will change".   After several episodes of verbal aggression, this RN informed Pt that staff do not function as servants but are present to assist Pt's in caring for themselves.  Pt initially angry, then became more polite.  Pt given towels and liquids.  Eye contact intense and glaring with angry facial expression.  Affect irritable, mood suspicious and irritable.  Pt easily becomes agitated, at which point becomes tangential/disorganized/incoherent.  Is paranoid that people are watching him through window to outside; responds to internal stimuli aloud, acknowledges AH.  Appears confused in fluctuating degrees.  Initially would not answer questions regarding safety, but agreed to remain safe when told that 1:1 would have to be considered if staff was not comfortable leaving him in room alone.  Denies VH, HI, and pain.    A: Support offered, provided comfort items.  No medications administered.  Q15 minute safety checks maintained as per unit protocol.  R: Pt highly disorganized and paranoid.  Safety maintained. Dion Saucier RN

## 2012-03-08 NOTE — Progress Notes (Signed)
Patient ID: David Lang, male   DOB: 01-04-1951, 62 y.o.   MRN: [id number] [facility] MD Progress Note  03/08/2012 10:40 AM David Lang  MRN:  [id number]  Subjective: Patient remains hostile, demanding, verbally aggressive towards the staffs, easily agitated,labile and delusional. Patient is non-compliant with his medications. He has refused so far to participate in unit activities.  Diagnosis:   Axis I: Schizophrenia, paranoia type. Axis II: Deferred Axis III:  Past Medical History  Diagnosis Date  . Hypertension   . Diabetes mellitus without complication    Axis IV: other psychosocial or environmental problems Axis V: 21-30 behavior considerably influenced by delusions or hallucinations OR serious impairment in judgment, communication OR inability to function in almost all areas  ADL's:  Impaired  Sleep: 5hrs  Appetite:  Fair  Suicidal Ideation:  Unable to assess, patient refused to answer. Homicidal Ideation:  Unable to assess, patient refused to answer  AEB (as evidenced by): patient's verbal reactions to assessment.  Psychiatric Specialty Exam: Review of Systems  Unable to perform ROS: mental acuity    Blood pressure 112/70, pulse 80, temperature 98.3 F (36.8 C), temperature source Oral, resp. rate 20, height 5' 9.5" (1.765 m), weight 80.74 kg (178 lb).Body mass index is 25.92 kg/(m^2).  General Appearance: Disheveled  Eye Contact::  Poor  Speech:  Clear and Coherent, angry and Pressured  Volume:  Increased  Mood:  Labile, angry, outburst, yelling  Affect:  Inappropriate  Thought Process:  Disorganized and incoherent,   Orientation: Unable to assess   Thought Content:  Delusions and Paranoid Ideation  Suicidal Thoughts:  Unable to assess, patient is uncooperative  Homicidal Thoughts:  Unable to assess, patient is uncooperative  Memory:  unable to assess, patient is uncooperative  Judgement:  Impaired  Insight:  Not present  Psychomotor Activity:  Increased,  Restlessness and agitated  Concentration:  Poor  Recall:  Unable to assess, patient is uncooperative  Akathisia:  No  Handed:  Right  AIMS (if indicated):     Assets:  Others:  Unable to assess, patient is uncooperative  Sleep:  Number of Hours: 5   Current Medications: Current Facility-Administered Medications  Medication Dose Route Frequency Provider Last Rate Last Dose  . acetaminophen (TYLENOL) tablet 650 mg  650 mg Oral Q6H PRN Shuvon Rankin, NP      . alum & mag hydroxide-simeth (MAALOX/MYLANTA) 200-200-20 MG/5ML suspension 30 mL  30 mL Oral Q4H PRN Shuvon Rankin, NP      . amLODipine (NORVASC) tablet 10 mg  10 mg Oral Daily Shuvon Rankin, NP   10 mg at 03/08/12 0901  . aspirin EC tablet 325 mg  325 mg Oral Daily Sanjuana Kava, NP   325 mg at 03/08/12 5784  . atenolol (TENORMIN) tablet 100 mg  100 mg Oral Daily Shuvon Rankin, NP   100 mg at 03/08/12 0901  . benztropine (COGENTIN) tablet 1 mg  1 mg Oral BID PRN Pearlie Lafosse      . diphenhydrAMINE (BENADRYL) capsule 25 mg  25 mg Oral QHS PRN Aaron Bostwick      . diphenhydrAMINE (BENADRYL) injection 50 mg  50 mg Intramuscular Once Kenedy Haisley      . haloperidol (HALDOL) tablet 5 mg  5 mg Oral BID Breyon Blass   5 mg at 03/08/12 0902  . haloperidol (HALDOL) tablet 5 mg  5 mg Oral Q6H PRN Julaine Zimny      . haloperidol lactate (HALDOL) injection 10 mg  10 mg Intramuscular  Once Ashika Apuzzo      . hydrALAZINE (APRESOLINE) tablet 10 mg  10 mg Oral Q6H PRN Shuvon Rankin, NP      . LORazepam (ATIVAN) injection 1 mg  1 mg Intramuscular Once Berdia Lachman      . LORazepam (ATIVAN) tablet 2 mg  2 mg Oral Q6H PRN Ytzel Gubler      . magnesium hydroxide (MILK OF MAGNESIA) suspension 30 mL  30 mL Oral Daily PRN Shuvon Rankin, NP      . menthol-cetylpyridinium (CEPACOL) lozenge 3 mg  1 lozenge Oral PRN Shuvon Rankin, NP      . potassium chloride SA (K-DUR,KLOR-CON) CR tablet 20 mEq  20 mEq Oral Daily Virna Livengood   20 mEq  at 03/08/12 4098    Lab Results: No results found for this or any previous visit (from the past 48 hour(s)).  Physical Findings: AIMS: Facial and Oral Movements Muscles of Facial Expression: None, normal Lips and Perioral Area: None, normal Jaw: None, normal Tongue: None, normal,Extremity Movements Upper (arms, wrists, hands, fingers): None, normal Lower (legs, knees, ankles, toes): None, normal, Trunk Movements Neck, shoulders, hips: None, normal, Overall Severity Severity of abnormal movements (highest score from questions above): None, normal Incapacitation due to abnormal movements: None, normal Patient's awareness of abnormal movements (rate only patient's report): No Awareness, Dental Status Current problems with teeth and/or dentures?: Yes Does patient usually wear dentures?: No  CIWA:    COWS:     Treatment Plan Summary: Daily contact with patient to assess and evaluate symptoms and progress in treatment Medication management  Plan: Supportive approach/coping skills/stabiliztion Decrease stimulation. Will continue to monitor response to/adverse effects of medications in use to assure effectiveness. Continue to monitor mood, behavior and interaction with staff and other patients. Continue Haldol to 5mg  po BID for delusions Continue Trileptal 150mg  BID for labile mood Cogentin 1mg  po BID for EPS prevention. Will request second physician evaluation for forced medication due to refusal to accept oral medication and patient acuity.  Medical Decision Making Problem Points:  Established problem, worsening (2), Review of last therapy session (1) and Review of psycho-social stressors (1) Data Points:  Review of medication regiment & side effects (2) Review of new medications or change in dosage (2)  I certify that inpatient services furnished can reasonably be expected to improve the patient's condition.   Namish Krise,MD 03/08/2012, 10:40 AM

## 2012-03-08 NOTE — Clinical Social Work Note (Signed)
[facility] LCSW Group Therapy  03/08/2012 , 4:01 PM   Type of Therapy:  Group Therapy  Participation Level:  Did not attend      Summary of Progress/Problems: Today's group focused on the term Diagnosis.  Participants were asked to define the term, and then pronounce whether it is a negative, positive or neutral term.  Daryel Gerald B 03/08/2012 , 4:01 PM

## 2012-03-08 NOTE — Progress Notes (Signed)
D:  Patient stayed in his room all day.  He took his medications reluctantly and had many questions, especially regarding the haloperidol.  He has refused to participate in any groups and refused to complete his self inventory.  He is disheveled in his appearance and has not bathed.  His voice is loud, his speech is pressured and he is paranoid in the thinking.    A:  Medications given and explained.  All questions answered to the patients satisfaction.  She remains on one to one for safety.   R:  Continues to be paranoid in her thinking, but is easily re-directed.  Patient remains safe on the unit at this time.

## 2012-03-08 NOTE — Progress Notes (Signed)
[facility] LCSW Aftercare Discharge Planning Group Note  03/08/2012 4:02 PM  Participation Quality:  Did not attend    Ida Rogue 03/08/2012, 4:02 PM

## 2012-03-09 MED ORDER — OXCARBAZEPINE 150 MG PO TABS
150.0000 mg | ORAL_TABLET | Freq: Two times a day (BID) | ORAL | Status: DC
  Administered 2012-03-09 – 2012-03-10 (×2): 150 mg via ORAL
  Filled 2012-03-09 (×6): qty 1

## 2012-03-09 MED ORDER — HALOPERIDOL LACTATE 5 MG/ML IJ SOLN
10.0000 mg | Freq: Two times a day (BID) | INTRAMUSCULAR | Status: DC
  Filled 2012-03-09 (×10): qty 2

## 2012-03-09 MED ORDER — DIPHENHYDRAMINE HCL 50 MG/ML IJ SOLN: 50.0000 mg | Freq: Four times a day (QID) | INTRAMUSCULAR | Status: DC | PRN

## 2012-03-09 MED ORDER — FAMOTIDINE 20 MG PO TABS
20.0000 mg | ORAL_TABLET | Freq: Two times a day (BID) | ORAL | Status: DC | PRN
  Administered 2012-03-09 – 2012-03-29 (×3): 20 mg via ORAL
  Filled 2012-03-09 (×3): qty 1

## 2012-03-09 MED ORDER — HALOPERIDOL 5 MG PO TABS
10.0000 mg | ORAL_TABLET | Freq: Two times a day (BID) | ORAL | Status: DC
  Administered 2012-03-09 – 2012-03-12 (×7): 10 mg via ORAL
  Filled 2012-03-09 (×10): qty 2

## 2012-03-09 MED ORDER — DIPHENHYDRAMINE HCL 25 MG PO CAPS
50.0000 mg | ORAL_CAPSULE | Freq: Four times a day (QID) | ORAL | Status: DC | PRN
  Administered 2012-03-29: 50 mg via ORAL
  Filled 2012-03-09 (×2): qty 1

## 2012-03-09 MED ORDER — CALCIUM CARBONATE ANTACID 500 MG PO CHEW
1.0000 | CHEWABLE_TABLET | ORAL | Status: DC | PRN
  Administered 2012-03-19 – 2012-03-29 (×2): 200 mg via ORAL
  Filled 2012-03-09 (×6): qty 1

## 2012-03-09 NOTE — Progress Notes (Signed)
[facility] LCSW Aftercare Discharge Planning Group Note  03/09/2012 9:32 AM  Participation Quality:  Did not attend    David Lang 03/09/2012, 9:32 AM

## 2012-03-09 NOTE — Progress Notes (Signed)
Psychoeducational Group Note  Date:  03/09/2012 Time:  2010  Group Topic/Focus:  Wrap-Up Group:   The focus of this group is to help patients review their daily goal of treatment and discuss progress on daily workbooks.  Participation Level: Did Not Attend  Participation Quality:  Not Applicable  Affect:  Not Applicable  Cognitive:  Not Applicable  Insight:  Not Applicable  Engagement in Group: Not Applicable  Additional Comments:  Pt refused to attended group   Gwenevere Ghazi Patience 03/09/2012, 10:41 PM

## 2012-03-09 NOTE — Progress Notes (Addendum)
Patient ID: David Lang, male   DOB: 16-Jan-1951, 62 y.o.   MRN: [id number] [facility] MD Progress Note  03/09/2012 11:18 AM David Lang  MRN:  [id number]  Subjective: Met with David Lang 1:1 today. He is still loud and verbally aggressive with staff and other patients.  He is refusing medications, but did take them orally. He is demanding a sheet of paper to write a note the the President of the [place] because, "[place] is not in good shape."  Diagnosis:   Axis I: Schizophrenia, paranoia type. Axis II: Deferred Axis III:  Past Medical History  Diagnosis Date  . Hypertension   . Diabetes mellitus without complication    Axis IV: other psychosocial or environmental problems Axis V: 21-30 behavior considerably influenced by delusions or hallucinations OR serious impairment in judgment, communication OR inability to function in almost all areas  ADL's:  Impaired  Sleep: 5hrs  Appetite:  Fair  Suicidal Ideation:  Unable to assess, patient refused to answer. Homicidal Ideation:  Unable to assess, patient refused to answer  AEB (as evidenced by): patient's verbal reactions to assessment.  Psychiatric Specialty Exam: Review of Systems  Unable to perform ROS: mental acuity    Blood pressure 176/81, pulse 71, temperature 98.2 F (36.8 C), temperature source Oral, resp. rate 18, height 5' 9.5" (1.765 m), weight 80.74 kg (178 lb), SpO2 98.00%.Body mass index is 25.92 kg/(m^2).  General Appearance: Disheveled  Eye Contact::  Poor  Speech:  Clear and Coherent, angry and Pressured  Volume:  Increased  Mood:  Labile, angry, outburst, yelling  Affect:  Inappropriate  Thought Process:  Disorganized and incoherent,   Orientation: Unable to assess   Thought Content:  Delusions and Paranoid Ideation  Suicidal Thoughts:  Unable to assess, patient is uncooperative  Homicidal Thoughts:  Unable to assess, patient is uncooperative  Memory:  unable to assess, patient is uncooperative  Judgement:   Impaired  Insight:  Not present  Psychomotor Activity:  Increased, Restlessness and agitated  Concentration:  Poor  Recall:  Unable to assess, patient is uncooperative  Akathisia:  No  Handed:  Right  AIMS (if indicated):     Assets:  Others:  Unable to assess, patient is uncooperative  Sleep:  Number of Hours: 4.5   Current Medications: Current Facility-Administered Medications  Medication Dose Route Frequency Provider Last Rate Last Dose  . acetaminophen (TYLENOL) tablet 650 mg  650 mg Oral Q6H PRN Shuvon Rankin, NP      . alum & mag hydroxide-simeth (MAALOX/MYLANTA) 200-200-20 MG/5ML suspension 30 mL  30 mL Oral Q4H PRN Shuvon Rankin, NP      . amLODipine (NORVASC) tablet 10 mg  10 mg Oral Daily Shuvon Rankin, NP   10 mg at 03/09/12 0805  . aspirin EC tablet 325 mg  325 mg Oral Daily Sanjuana Kava, NP   325 mg at 03/09/12 5621  . atenolol (TENORMIN) tablet 100 mg  100 mg Oral Daily Shuvon Rankin, NP   100 mg at 03/09/12 0803  . benztropine (COGENTIN) tablet 1 mg  1 mg Oral BID PRN Mojeed Akintayo      . diphenhydrAMINE (BENADRYL) capsule 50 mg  50 mg Oral Q6H PRN Verne Spurr, PA-C       Or  . diphenhydrAMINE (BENADRYL) injection 50 mg  50 mg Intramuscular Q6H PRN Verne Spurr, PA-C      . diphenhydrAMINE (BENADRYL) injection 50 mg  50 mg Intramuscular Once Mojeed Akintayo      .  haloperidol (HALDOL) tablet 10 mg  10 mg Oral BID Verne Spurr, PA-C   10 mg at 03/09/12 1038   Or  . haloperidol lactate (HALDOL) injection 10 mg  10 mg Intramuscular BID Verne Spurr, PA-C      . haloperidol (HALDOL) tablet 5 mg  5 mg Oral Q6H PRN Mojeed Akintayo      . hydrALAZINE (APRESOLINE) tablet 10 mg  10 mg Oral Q6H PRN Shuvon Rankin, NP   10 mg at 03/09/12 1038  . LORazepam (ATIVAN) injection 1 mg  1 mg Intramuscular Once Mojeed Akintayo      . LORazepam (ATIVAN) tablet 2 mg  2 mg Oral Q6H PRN Mojeed Akintayo      . magnesium hydroxide (MILK OF MAGNESIA) suspension 30 mL  30 mL Oral Daily PRN  Shuvon Rankin, NP      . menthol-cetylpyridinium (CEPACOL) lozenge 3 mg  1 lozenge Oral PRN Shuvon Rankin, NP      . potassium chloride SA (K-DUR,KLOR-CON) CR tablet 20 mEq  20 mEq Oral Daily Mojeed Akintayo   20 mEq at 03/09/12 1610    Lab Results: No results found for this or any previous visit (from the past 48 hour(s)).  Physical Findings: AIMS: Facial and Oral Movements Muscles of Facial Expression: None, normal Lips and Perioral Area: None, normal Jaw: None, normal Tongue: None, normal,Extremity Movements Upper (arms, wrists, hands, fingers): None, normal Lower (legs, knees, ankles, toes): None, normal, Trunk Movements Neck, shoulders, hips: None, normal, Overall Severity Severity of abnormal movements (highest score from questions above): None, normal Incapacitation due to abnormal movements: None, normal Patient's awareness of abnormal movements (rate only patient's report): No Awareness, Dental Status Current problems with teeth and/or dentures?: Yes Does patient usually wear dentures?: No  CIWA:    COWS:     Treatment Plan Summary: Daily contact with patient to assess and evaluate symptoms and progress in treatment Medication management  Plan: Supportive approach/coping skills/stabiliztion Decrease stimulation. Will continue to monitor response to/adverse effects of medications in use to assure effectiveness. Continue to monitor mood, behavior and interaction with staff and other patients. Continue Haldol to 5mg  po BID for delusions Initiate Trileptal 150 mg BID for labile mood Cogentin 1mg  po BID for EPS prevention. Will request second physician evaluation for forced medication due to refusal to accept oral medication and patient acuity.  Medical Decision Making Problem Points:  Established problem, worsening (2), Review of last therapy session (1) and Review of psycho-social stressors (1) Data Points:  Review of medication regiment & side effects (2) Review of new  medications or change in dosage (2)  I certify that inpatient services furnished can reasonably be expected to improve the patient's condition.   Rona Ravens. Rukiya Hodgkins RPAC 03/09/2012, 11:18 AM

## 2012-03-09 NOTE — Progress Notes (Signed)
D: Patient denies SI/HI and visual hallucinations. The patient is positive for auditory hallucinations. The patient has a labile mood and an angry affect. The patient communicates solely through shouting. Patient states that he does this so "people will hear him." The patient exhibits tangential speech and paranoid behavior. The patient's blood pressure was elevated this morning and PA was notified. The patient is not attending groups and prefers to isolate to his room. The patient's state of hygiene is disheveled.  A: Patient given emotional support from RN. Patient encouraged to come to staff with concerns and/or questions. Patient's medication routine continued. Patient's orders and plan of care reviewed.  R: Patient remains somewhat cooperative. Will continue to monitor patient q15 minutes for safety.

## 2012-03-09 NOTE — Progress Notes (Signed)
Patient ID: David Lang, male   DOB: 04-10-1950, 62 y.o.   MRN: [id number]  D: Pt denies SI/HI/AVH. Pt continues to have loud, pressured speech. Pt still very argumentative and rude, but pt can be redirected at times. Pt seems to have internal stimuli.   A: Pt was offered support and encouragement. Pt was given scheduled medications. Pt was encourage to attend groups. Q 15 minute checks were done for safety.    R:Pt attends groups and interacts well with peers and staff. Pt is taking medication. Pt has no complaints.Pt receptive to treatment and safety maintained on unit.
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
## 2012-03-09 NOTE — Progress Notes (Signed)
Recreation Therapy Notes  03/09/2012          Time: 9:30am   Group Topic/Focus: Time management   Participation Level:  Did not attend   Jearl Klinefelter, LRT/CTRS    Annelyse Rey L 03/09/2012 10:07 AM

## 2012-03-09 NOTE — Clinical Social Work Note (Signed)
[facility] Group Notes:  (Counselor/Nursing/MHT/Case Management/Adjunct)  12/11/2011 12:00 PM  Type of Therapy:  Group Therapy  Participation Level:  Did Not Attend  Smart, Heather N 12/11/2011, 12:00 PM  

## 2012-03-10 MED ORDER — OXCARBAZEPINE 300 MG PO TABS
300.0000 mg | ORAL_TABLET | Freq: Two times a day (BID) | ORAL | Status: DC
  Administered 2012-03-10 – 2012-03-21 (×22): 300 mg via ORAL
  Filled 2012-03-10 (×24): qty 1

## 2012-03-10 NOTE — Progress Notes (Signed)
Patient ID: David Lang, male   DOB: 1950-03-06, 62 y.o.   MRN: [id number]  D: Pt denies SI/HI/AVH. Pt is disruptive and un-cooperative. Pt has improved attitude, but continues to be demanding and racially motivated. Pt requesting bottled water, saying "its for my medical condition". Pt was rude, demanding and told Clinical research associate he did not want to talk.   A: Pt was offered support and encouragement. Pt was encourage to attend groups. Q 15 minute checks were done for safety. Pt given 2 mg of ativan at 257  R:Pt stays in room and isolates himself.  Pt has no complaints.Pt not receptive to treatment and safety maintained on unit.

## 2012-03-10 NOTE — Clinical Social Work Note (Signed)
[facility] Group Notes:  (Counselor/Nursing/MHT/Case Management/Adjunct)  12/10/2011 2:20 PM  Type of Therapy:  Group Therapy  Participation Level:  Did Not Attend    Summary of Progress/Problems: .balance: The topic for group was balance in life.  Pt participated in the discussion about when their life was in balance and out of balance and how this feels.  Pt discussed ways to get back in balance and short term goals they can work on to get where they want to be.    David Lang B 12/10/2011, 2:20 PM  

## 2012-03-10 NOTE — Progress Notes (Signed)
Pt continues to be rude and has racial undertones in his speech, which includes derogatory speech and attitude. PT continues to be loud, and disruptive to the miliue.

## 2012-03-10 NOTE — Progress Notes (Signed)
Patient ID: David Lang, male   DOB: 09-05-1950, 62 y.o.   MRN: [id number] [facility] MD Progress Note  03/10/2012 1:23 PM Aum Caggiano  MRN:  [id number]  Subjective: Patient remains hostile,  verbally aggressive towards the staffs, easily agitated, labile and delusional. He is oppositional, defiant and difficulty to re-direct. He has refused to participate in unit activities.He is partially compliant with his medications.  Diagnosis:   Axis I: Schizophrenia, paranoia type. Axis II: Deferred Axis III:  Past Medical History  Diagnosis Date  . Hypertension   . Diabetes mellitus without complication    Axis IV: other psychosocial or environmental problems Axis V: 21-30 behavior considerably influenced by delusions or hallucinations OR serious impairment in judgment, communication OR inability to function in almost all areas  ADL's:  Impaired  Sleep: 5hrs  Appetite:  Fair  Suicidal Ideation: denies  Homicidal Ideation: denies   AEB (as evidenced by): patient's verbal reactions to assessment.  Psychiatric Specialty Exam: Review of Systems  Unable to perform ROS: mental acuity    Blood pressure 147/78, pulse 68, temperature 97.6 F (36.4 C), temperature source Oral, resp. rate 18, height 5' 9.5" (1.765 m), weight 80.74 kg (178 lb), SpO2 98.00%.Body mass index is 25.92 kg/(m^2).  General Appearance: Disheveled  Eye Contact::  Poor  Speech:  Clear and Coherent, angry and Pressured  Volume:  Increased  Mood:  Labile, angry, outburst, yelling  Affect:  Inappropriate  Thought Process:  Disorganized and incoherent,   Orientation: Unable to assess   Thought Content:  Delusions and Paranoid Ideation  Suicidal Thoughts:  Unable to assess, patient is uncooperative  Homicidal Thoughts:  Unable to assess, patient is uncooperative  Memory:  unable to assess, patient is uncooperative  Judgement:  Impaired  Insight:  Not present  Psychomotor Activity:  Increased, Restlessness and agitated   Concentration:  Poor  Recall:  Unable to assess, patient is uncooperative  Akathisia:  No  Handed:  Right  AIMS (if indicated):     Assets:  Others:  Unable to assess, patient is uncooperative  Sleep:  Number of Hours: 5.5   Current Medications: Current Facility-Administered Medications  Medication Dose Route Frequency Provider Last Rate Last Dose  . acetaminophen (TYLENOL) tablet 650 mg  650 mg Oral Q6H PRN Shuvon Rankin, NP      . alum & mag hydroxide-simeth (MAALOX/MYLANTA) 200-200-20 MG/5ML suspension 30 mL  30 mL Oral Q4H PRN Shuvon Rankin, NP      . amLODipine (NORVASC) tablet 10 mg  10 mg Oral Daily Shuvon Rankin, NP   10 mg at 03/10/12 0823  . aspirin EC tablet 325 mg  325 mg Oral Daily Sanjuana Kava, NP   325 mg at 03/10/12 2841  . atenolol (TENORMIN) tablet 100 mg  100 mg Oral Daily Shuvon Rankin, NP   100 mg at 03/10/12 0823  . benztropine (COGENTIN) tablet 1 mg  1 mg Oral BID PRN Jules Baty      . calcium carbonate (TUMS - dosed in mg elemental calcium) chewable tablet 200 mg of elemental calcium  1 tablet Oral Q4H PRN Kerry Hough, PA      . diphenhydrAMINE (BENADRYL) capsule 50 mg  50 mg Oral Q6H PRN Verne Spurr, PA-C       Or  . diphenhydrAMINE (BENADRYL) injection 50 mg  50 mg Intramuscular Q6H PRN Verne Spurr, PA-C      . diphenhydrAMINE (BENADRYL) injection 50 mg  50 mg Intramuscular Once Merrill Deanda      .  famotidine (PEPCID) tablet 20 mg  20 mg Oral Q12H PRN Kerry Hough, PA   20 mg at 03/09/12 2312  . haloperidol (HALDOL) tablet 10 mg  10 mg Oral BID Verne Spurr, PA-C   10 mg at 03/10/12 8295   Or  . haloperidol lactate (HALDOL) injection 10 mg  10 mg Intramuscular BID Verne Spurr, PA-C      . haloperidol (HALDOL) tablet 5 mg  5 mg Oral Q6H PRN Julie Paolini      . hydrALAZINE (APRESOLINE) tablet 10 mg  10 mg Oral Q6H PRN Shuvon Rankin, NP   10 mg at 03/09/12 1038  . LORazepam (ATIVAN) injection 1 mg  1 mg Intramuscular Once Marynell Bies       . LORazepam (ATIVAN) tablet 2 mg  2 mg Oral Q6H PRN Jaxxson Cavanah   2 mg at 03/09/12 2339  . magnesium hydroxide (MILK OF MAGNESIA) suspension 30 mL  30 mL Oral Daily PRN Shuvon Rankin, NP      . menthol-cetylpyridinium (CEPACOL) lozenge 3 mg  1 lozenge Oral PRN Shuvon Rankin, NP      . Oxcarbazepine (TRILEPTAL) tablet 300 mg  300 mg Oral BID Kamera Dubas        Lab Results: No results found for this or any previous visit (from the past 48 hour(s)).  Physical Findings: AIMS: Facial and Oral Movements Muscles of Facial Expression: None, normal Lips and Perioral Area: None, normal Jaw: None, normal Tongue: None, normal,Extremity Movements Upper (arms, wrists, hands, fingers): None, normal Lower (legs, knees, ankles, toes): None, normal, Trunk Movements Neck, shoulders, hips: None, normal, Overall Severity Severity of abnormal movements (highest score from questions above): None, normal Incapacitation due to abnormal movements: None, normal Patient's awareness of abnormal movements (rate only patient's report): No Awareness, Dental Status Current problems with teeth and/or dentures?: Yes Does patient usually wear dentures?: No  CIWA:    COWS:     Treatment Plan Summary: Daily contact with patient to assess and evaluate symptoms and progress in treatment Medication management  Plan: Supportive approach/coping skills/stabiliztion Decrease stimulation. Will continue to monitor response to/adverse effects of medications in use to assure effectiveness. Continue to monitor mood, behavior and interaction with staff and other patients. Continue Haldol to 5mg  po BID for delusions Increase Trileptal to 300mg  BID for labile mood Cogentin 1mg  po BID for EPS prevention.   Medical Decision Making Problem Points:  Established problem, worsening (2), Review of last therapy session (1) and Review of psycho-social stressors (1) Data Points:  Review of medication regiment & side effects  (2) Review of new medications or change in dosage (2)  I certify that inpatient services furnished can reasonably be expected to improve the patient's condition.   Kashawn Manzano,MD 03/10/2012, 1:23 PM

## 2012-03-10 NOTE — Treatment Plan (Signed)
Interdisciplinary Treatment Plan Update (Adult)  Date: 03/10/2012  Time Reviewed: 1:05 PM   Progress in Treatment: Attending groups: No Participating in groups: No Taking medication as prescribed: Yes Tolerating medication: Yes  Family/Significant other contact made:  Yes Patient understands diagnosis:  No No insight Discussing patient identified problems/goals with staff:  Yes  See below Medical problems stabilized or resolved:  Yes Denies suicidal/homicidal ideation: Yes  In tx team Issues/concerns per patient self-inventory:  Not filled out Other:  New problem(s) identified: N/A  Reason for Continuation of Hospitalization: Hallucinations Medication stabilization  Interventions implemented related to continuation of hospitalization: Second opinion received to force meds  Pt now taking Haldol  Will consider giving injection when stable  Encourage group attendance and participation  Additional comments:  Estimated length of stay: 5-10 days  Discharge Plan: unclear-possible ALF placement per family insistence- requested [facility] evaluation  New goal(s): N/A  Review of initial/current patient goals per problem list:   1.  Goal(s): Eliminate psychosis  Met:  No  Target date:2/18  As evidenced by:Today David Lang declares that there is no reason for him to be here, that there is nothing wrong, and that he needs to be discharged immediately.    He is loud and easily agitated.    2.  Goal (s): David Lang will take medication as prescribed  Met:  Yes  Target date:2/13  As evidenced RU:EAVW is now taking meds PO  3.  Goal(s): Identify outpt provider  Met:  No  Target date:2/14  As evidenced [id number] of appointment time and date  Provider will be dependent on disposition at d/c, which is yet to be determined  4.  Goal(s):  Met:  No  Target date:  As evidenced by:  Attendees: Patient:     Family:     Physician:  Thedore Mins 03/10/2012 1:05 PM   Nursing:  Nestor Ramp  03/10/2012 1:05 PM   Clinical Social Worker:  Richelle Ito 03/10/2012 1:05 PM   Extender:  Verne Spurr PA 03/10/2012 1:05 PM   Other:     Other:     Other:     Other:      Scribe for Treatment Team:   Ida Rogue, 03/10/2012 1:05 PM

## 2012-03-10 NOTE — Progress Notes (Signed)
D: Patient denies SI and HI. Patient unwilling to tell staff if he is having auditory or visual hallucinations. Patient states "Why are you asking me all of these questions?" Patient has a labile mood and a labile/angry affect. The patient was appropriate when taking medications from writer but kept stating that his sister is "the one who needs help" and that she "needs mental counseling." Patient encouraged to go to groups and attend meals in the cafeteria.   A: Patient given emotional support from RN. Patient encouraged to come to staff with concerns and/or questions. Patient's medication routine continued. Patient's orders and plan of care reviewed. Patient refusing to attend groups and refusing to attend meals in the cafeteria.  R: Patient remains somewhat cooperative. Will continue to monitor patient q15 minutes for safety.

## 2012-03-10 NOTE — Progress Notes (Signed)
[facility] Group Notes:  (Nursing/MHT/Case Management/Adjunct)  Date:  03/10/2012  Time:  4:24 PM  Type of Therapy:  Psychoeducational Skills  Participation Level:  Did Not Attend  Summary of Progress/Problems:  David Lang 03/10/2012, 4:24 PM

## 2012-03-10 NOTE — Progress Notes (Signed)
This patient was at the window at the beginning of this shift. He was very irritable, loud and agitated. His nurse offered him Ativan, he refused it because he wont take medication from a black person per his nurse. Writer suggested that the nurse give the medication to [facility], who is a Therapist, occupational. Patient too his medication from [place].  Writer checked on patient about 30 minutes after receiving his medication, he was in bed, mumbling some words. Will continue to monitor.

## 2012-03-11 NOTE — Progress Notes (Signed)
Psychoeducational Group Note  Date:  03/11/2012 Time: 2000 Group Topic/Focus:  Wrap-Up Group:   The focus of this group is to help patients review their daily goal of treatment and discuss progress on daily workbooks.  Participation Level: Did Not Attend  Participation Quality:  Not Applicable  Affect:  Not Applicable  Cognitive:  Not Applicable  Insight:  Not Applicable  Engagement in Group: Not Applicable  Additional Comments:  Pt. Refused to attend group   Gwenevere Ghazi Patience 03/11/2012, 9:28 PM

## 2012-03-11 NOTE — Clinical Social Work Note (Signed)
[facility] Group Notes:  (Counselor/Nursing/MHT/Case Management/Adjunct)  12/11/2011 12:00 PM  Type of Therapy:  Group Therapy  Participation Level:  Did Not Attend  Smart, Heather N 12/11/2011, 12:00 PM  

## 2012-03-11 NOTE — H&P (Signed)
Agree with assessment and plan Gurvir Schrom A. Macarius Ruark, M.D. 

## 2012-03-11 NOTE — [facility] Counselor (Signed)
Adult Comprehensive Assessment  Patient ID: Deloyd Handy, male   DOB: 02-10-1950, 62 y.o.   MRN: [id number]  Information Source: Information source: Patient  Current Stressors:  Educational / Learning stressors: N/A Employment / Job issues: Yes  Occupational issues as pt isolates at home Family Relationships: Yes  States his sister's are "deficientEngineer, petroleum / Lack of resources (include bankruptcy): Yes  No income, but sister [name] is pursuing disability, MCD Housing / Lack of housing: Yes  Pt burneed apartment to an unknown extent prior to admission, and sister wants to place him in ALF Physical health (include injuries & life threatening diseases): N/A Social relationships: Yes  None Substance abuse: N/A Bereavement / Loss: N/A  Living/Environment/Situation:  Living Arrangements: Alone Living conditions (as described by patient or guardian): apartment How long has patient lived in current situation?: "longer than you have lived" What is atmosphere in current home: Comfortable  Family History:  Marital status: Single Does patient have children?: No  Childhood History:  By whom was/is the patient raised?: Both parents Additional childhood history information: N/A Description of patient's relationship with caregiver when they were a child: "deficient parents" Patient's description of current relationship with people who raised him/her: "they are deceased" Does patient have siblings?: Yes Number of Siblings: 3 (older sisters) Description of patient's current relationship with siblings: "they are freinds of the devil" Did patient suffer any verbal/emotional/physical/sexual abuse as a child?: No Did patient suffer from severe childhood neglect?: No Has patient ever been sexually abused/assaulted/raped as an adolescent or adult?: No Was the patient ever a victim of a crime or a disaster?: No Witnessed domestic violence?: No Has patient been effected by domestic violence as an  adult?: No  Education:  Highest grade of school patient has completed: some college Currently a Consulting civil engineer?: No Learning disability?: No  Employment/Work Situation:   Employment situation: Unemployed Patient's job has been impacted by current illness: No What is the longest time patient has a held a job?: unknown Where was the patient employed at that time?: unknown Has patient ever been in the [employer]?: No Has patient ever served in Buyer, retail?: No  Financial Resources:   Surveyor, quantity resources: Support from parents / caregiver Does patient have a Lawyer or guardian?: No  Alcohol/Substance Abuse:   What has been your use of drugs/alcohol within the last 12 months?: N/A  Social Support System:   Forensic psychologist System: None Describe Community Support System: None Type of faith/religion: Christian, Muslim, Hindu How does patient's faith help to cope with current illness?: "I am a prophet"  Leisure/Recreation:   Leisure and Hobbies: none  Strengths/Needs:   What things does the patient do well?: none In what areas does patient struggle / problems for patient: none  Discharge Plan:   Does patient have access to transportation?: Yes Will patient be returning to same living situation after discharge?: No Plan for living situation after discharge: Sister looking into placement in ALF Currently receiving community mental health services: No If no, would patient like referral for services when discharged?: No Does patient have financial barriers related to discharge medications?: Yes Patient description of barriers related to discharge medications: no income  Summary/Recommendations:   Summary and Recommendations (to be completed by the evaluator): Merlyn Albert is a 62 yearold male who has a severe and persistent mental illness.  He has recently been unable to care for himself, and even here continues to not shower.  His hair, beard and fingernails have not been cut in a  very long time.  He was unwilling to take meds initially, but with second opinion is now taking meds reluctantly while insisting that we are poisoning him.  He can benefit from medication management and clinical social work coordiination with his family to determine dispositional plan for d/c.  Daryel Gerald B. 03/11/2012

## 2012-03-11 NOTE — Progress Notes (Signed)
[facility] MD Progress Note  03/11/2012 10:04 AM David Lang  MRN:  [id number]  Subjective: Patient has been observed talking to himself, he is verbally aggressive, easily agitated,demanding and disrespectful towards the staffs.He is very paranoid, extremely delusional, oppositional, defiant and difficulty to re-direct. He has refused to participate in unit activities.He is partially compliant with his medications and has not reported any adverse reactions.  Diagnosis:  Axis I: Chronic Paranoid Schizophrenia Axis II: Deferred  ADL's:  Impaired  Sleep: Fair  Appetite:  Fair  Suicidal Ideation:  Unable to assess;"I won't answer that" Homicidal Ideation:  Unable to assess;"I won't answer that" AEB (as evidenced by):  Psychiatric Specialty Exam: Review of Systems  Constitutional: Negative.   HENT: Negative.   Eyes: Negative.   Respiratory: Negative.   Cardiovascular: Negative.   Gastrointestinal: Negative.   Genitourinary: Negative.   Musculoskeletal: Negative.   Skin: Negative.   Neurological: Negative.   Endo/Heme/Allergies: Negative.   Psychiatric/Behavioral: Positive for hallucinations.       Agitated    Blood pressure 153/90, pulse 63, temperature 97.4 F (36.3 C), temperature source Oral, resp. rate 18, height 5' 9.5" (1.765 m), weight 80.74 kg (178 lb), SpO2 98.00%.Body mass index is 25.92 kg/(m^2).  General Appearance: Disheveled  Eye Contact::  Poor  Speech:  Garbled and loud  Volume:  Increased  Mood:  Angry and Irritable  Affect:  Labile and Full Range  Thought Process:  Disorganized  Orientation:  Other:  unable to assess.  Thought Content:  Delusions and Hallucinations: talking to self  Suicidal Thoughts:  No  Homicidal Thoughts:  No  Memory:  unable to assess.  Judgement:  Poor  Insight:  Lacking  Psychomotor Activity:  Increased  Concentration:  Poor  Recall:  Poor  Akathisia:  No  Handed:  Right  AIMS (if indicated):     Assets:  Social Support  Sleep:   Number of Hours: 6   Current Medications: Current Facility-Administered Medications  Medication Dose Route Frequency Provider Last Rate Last Dose  . acetaminophen (TYLENOL) tablet 650 mg  650 mg Oral Q6H PRN Shuvon Rankin, NP      . alum & mag hydroxide-simeth (MAALOX/MYLANTA) 200-200-20 MG/5ML suspension 30 mL  30 mL Oral Q4H PRN Shuvon Rankin, NP      . amLODipine (NORVASC) tablet 10 mg  10 mg Oral Daily Shuvon Rankin, NP   10 mg at 03/11/12 8295  . aspirin EC tablet 325 mg  325 mg Oral Daily Sanjuana Kava, NP   325 mg at 03/11/12 6213  . atenolol (TENORMIN) tablet 100 mg  100 mg Oral Daily Shuvon Rankin, NP   100 mg at 03/11/12 0865  . benztropine (COGENTIN) tablet 1 mg  1 mg Oral BID PRN Rogan Ecklund      . calcium carbonate (TUMS - dosed in mg elemental calcium) chewable tablet 200 mg of elemental calcium  1 tablet Oral Q4H PRN Kerry Hough, PA      . diphenhydrAMINE (BENADRYL) capsule 50 mg  50 mg Oral Q6H PRN Verne Spurr, PA-C       Or  . diphenhydrAMINE (BENADRYL) injection 50 mg  50 mg Intramuscular Q6H PRN Verne Spurr, PA-C      . diphenhydrAMINE (BENADRYL) injection 50 mg  50 mg Intramuscular Once Earlyne Feeser      . famotidine (PEPCID) tablet 20 mg  20 mg Oral Q12H PRN Kerry Hough, PA   20 mg at 03/09/12 2312  . haloperidol (HALDOL) tablet  10 mg  10 mg Oral BID Verne Spurr, PA-C   10 mg at 03/11/12 4098   Or  . haloperidol lactate (HALDOL) injection 10 mg  10 mg Intramuscular BID Verne Spurr, PA-C      . haloperidol (HALDOL) tablet 5 mg  5 mg Oral Q6H PRN Hazley Dezeeuw      . hydrALAZINE (APRESOLINE) tablet 10 mg  10 mg Oral Q6H PRN Shuvon Rankin, NP   10 mg at 03/09/12 1038  . LORazepam (ATIVAN) injection 1 mg  1 mg Intramuscular Once Murat Rideout      . LORazepam (ATIVAN) tablet 2 mg  2 mg Oral Q6H PRN Iza Preston   2 mg at 03/11/12 0257  . magnesium hydroxide (MILK OF MAGNESIA) suspension 30 mL  30 mL Oral Daily PRN Shuvon Rankin, NP      .  menthol-cetylpyridinium (CEPACOL) lozenge 3 mg  1 lozenge Oral PRN Shuvon Rankin, NP      . Oxcarbazepine (TRILEPTAL) tablet 300 mg  300 mg Oral BID Darion Juhasz   300 mg at 03/11/12 1191    Lab Results: No results found for this or any previous visit (from the past 48 hour(s)).  Physical Findings: AIMS: Facial and Oral Movements Muscles of Facial Expression: None, normal Lips and Perioral Area: None, normal Jaw: None, normal Tongue: None, normal,Extremity Movements Upper (arms, wrists, hands, fingers): None, normal Lower (legs, knees, ankles, toes): None, normal, Trunk Movements Neck, shoulders, hips: None, normal, Overall Severity Severity of abnormal movements (highest score from questions above): None, normal Incapacitation due to abnormal movements: None, normal Patient's awareness of abnormal movements (rate only patient's report): No Awareness, Dental Status Current problems with teeth and/or dentures?: Yes Does patient usually wear dentures?: No  CIWA:    COWS:     Treatment Plan Summary: Daily contact with patient to assess and evaluate symptoms and progress in treatment Medication management  Plan: 1. Will continue patient on current medication regimen 2. Obtain collateral information. 3. Encourage patient to participates in unit activities and other milieu  Medical Decision Making Problem Points:  Established problem, stable/improving (1), Review of last therapy session (1) and Review of psycho-social stressors (1) Data Points:  Order Aims Assessment (2) Review of medication regiment & side effects (2) Review of new medications or change in dosage (2)  I certify that inpatient services furnished can reasonably be expected to improve the patient's condition.   Thedore Mins, MD 03/11/2012, 10:04 AM

## 2012-03-11 NOTE — Progress Notes (Signed)
Recreation Therapy Notes  03/11/2012         Time: 9:30am      Group Topic/Focus: Exercise  Participation Level: Did not attend  Jearl Klinefelter, LRT/CTRS   Ruchy Wildrick L 03/11/2012 10:10 AM

## 2012-03-11 NOTE — Progress Notes (Signed)
D: Patient denies SI and HI. Patient is unwilling to tell staff if he is having auditory or visual hallucinations. Patient states "I don't want to talk about that!" Patient has a labile mood and a labile/angry affect. The patient is taking medications but has frequent verbal outbursts with different staff members. Patient is tangential and rambles occasionally when talking to staff. As yesterday, patient was encouraged to go to groups and attend meals in the cafeteria.   A: Patient given emotional support from RN. Patient encouraged to come to staff with concerns and/or questions. Patient's medication routine continued. Patient's orders and plan of care reviewed. Patient refusing to attend groups and refusing to attend meals in the cafeteria.   R: Patient remains somewhat cooperative. Will continue to monitor patient q15 minutes for safety.

## 2012-03-12 ENCOUNTER — Encounter (HOSPITAL_COMMUNITY): Payer: Self-pay | Admitting: Physician Assistant

## 2012-03-12 DIAGNOSIS — Z9114 Patient's other noncompliance with medication regimen: Secondary | ICD-10-CM

## 2012-03-12 DIAGNOSIS — E119 Type 2 diabetes mellitus without complications: Secondary | ICD-10-CM | POA: Insufficient documentation

## 2012-03-12 DIAGNOSIS — Z91148 Patient's other noncompliance with medication regimen for other reason: Secondary | ICD-10-CM

## 2012-03-12 DIAGNOSIS — F99 Mental disorder, not otherwise specified: Secondary | ICD-10-CM | POA: Insufficient documentation

## 2012-03-12 MED ORDER — HALOPERIDOL LACTATE 5 MG/ML IJ SOLN
15.0000 mg | Freq: Two times a day (BID) | INTRAMUSCULAR | Status: DC
  Filled 2012-03-12 (×36): qty 3

## 2012-03-12 MED ORDER — BENZTROPINE MESYLATE 1 MG PO TABS
1.0000 mg | ORAL_TABLET | Freq: Two times a day (BID) | ORAL | Status: DC
  Administered 2012-03-12 – 2012-03-29 (×32): 1 mg via ORAL
  Filled 2012-03-12 (×36): qty 1

## 2012-03-12 MED ORDER — HALOPERIDOL 5 MG PO TABS
15.0000 mg | ORAL_TABLET | Freq: Two times a day (BID) | ORAL | Status: DC
  Administered 2012-03-12 – 2012-03-29 (×32): 15 mg via ORAL
  Filled 2012-03-12 (×15): qty 3
  Filled 2012-03-12: qty 1
  Filled 2012-03-12 (×25): qty 3

## 2012-03-12 NOTE — Progress Notes (Addendum)
Patient ID: David Lang, male   DOB: 1950-08-10, 62 y.o.   MRN: [id number] [facility] MD Progress Note  03/12/2012 4:50 PM Layken Doenges  MRN:  [id number]  Subjective: Patient has been observed talking to himself, he is verbally aggressive, easily agitated,demanding and disrespectful towards the staffs.He is very paranoid, extremely delusional, oppositional, defiant and very loud.  He is refusing blood draws.   I spoke with the patient's sister who noted that he did very well with Risperdal in the past, Haldol, and also [place]rap when he was in the [place]. When he was on injectable medication he was much more stable. He responded well to Haldol 30mg  a day per sister as that was his last known dose.  Diagnosis:  Schizophrenia paranoid type                      Hypertension                      DM type 2  ADL's:  Impaired  Sleep: Fair  Appetite:  Fair  Suicidal Ideation:  Unable to assess;"I won't answer that" Homicidal Ideation:  Unable to assess;"I won't answer that" AEB (as evidenced by):  Psychiatric Specialty Exam: Review of Systems  Unable to perform ROS Constitutional: Negative.   HENT: Negative.   Eyes: Negative.   Respiratory: Negative.   Cardiovascular: Negative.   Gastrointestinal: Negative.   Genitourinary: Negative.   Musculoskeletal: Negative.   Skin: Negative.   Neurological: Negative.   Endo/Heme/Allergies: Negative.   Psychiatric/Behavioral: Positive for hallucinations.       Agitated    Blood pressure 94/60, pulse 66, temperature 96.9 F (36.1 C), temperature source Oral, resp. rate 18, height 5' 9.5" (1.765 m), weight 80.74 kg (178 lb), SpO2 98.00%.Body mass index is 25.92 kg/(m^2).  General Appearance: Disheveled  Eye Contact::  Poor  Speech:  Garbled and loud  Volume:  Increased  Mood:  Angry and Irritable  Affect:  Labile and Full Range  Thought Process:  Disorganized  Orientation:  Other:  unable to assess.  Thought Content:  Delusions and  Hallucinations: talking to self  Suicidal Thoughts:  No  Homicidal Thoughts:  No  Memory:  unable to assess.  Judgement:  Poor  Insight:  Lacking  Psychomotor Activity:  Increased  Concentration:  Poor  Recall:  Poor  Akathisia:  No  Handed:  Right  AIMS (if indicated):     Assets:  Social Support  Sleep:  Number of Hours: 5   Current Medications: Current Facility-Administered Medications  Medication Dose Route Frequency Provider Last Rate Last Dose  . acetaminophen (TYLENOL) tablet 650 mg  650 mg Oral Q6H PRN Shuvon Rankin, NP      . alum & mag hydroxide-simeth (MAALOX/MYLANTA) 200-200-20 MG/5ML suspension 30 mL  30 mL Oral Q4H PRN Shuvon Rankin, NP      . amLODipine (NORVASC) tablet 10 mg  10 mg Oral Daily Shuvon Rankin, NP   10 mg at 03/12/12 0818  . aspirin EC tablet 325 mg  325 mg Oral Daily Sanjuana Kava, NP   325 mg at 03/12/12 0810  . atenolol (TENORMIN) tablet 100 mg  100 mg Oral Daily Shuvon Rankin, NP   100 mg at 03/12/12 0826  . benztropine (COGENTIN) tablet 1 mg  1 mg Oral BID Verne Spurr, PA-C   1 mg at 03/12/12 1620  . calcium carbonate (TUMS - dosed in mg elemental calcium) chewable tablet 200 mg of elemental  calcium  1 tablet Oral Q4H PRN Kerry Hough, PA      . diphenhydrAMINE (BENADRYL) capsule 50 mg  50 mg Oral Q6H PRN Verne Spurr, PA-C       Or  . diphenhydrAMINE (BENADRYL) injection 50 mg  50 mg Intramuscular Q6H PRN Verne Spurr, PA-C      . diphenhydrAMINE (BENADRYL) injection 50 mg  50 mg Intramuscular Once Mojeed Akintayo      . famotidine (PEPCID) tablet 20 mg  20 mg Oral Q12H PRN Kerry Hough, PA   20 mg at 03/09/12 2312  . haloperidol (HALDOL) tablet 15 mg  15 mg Oral BID Verne Spurr, PA-C   15 mg at 03/12/12 1620   Or  . haloperidol lactate (HALDOL) injection 15 mg  15 mg Intramuscular BID Verne Spurr, PA-C      . haloperidol (HALDOL) tablet 5 mg  5 mg Oral Q6H PRN Mojeed Akintayo      . hydrALAZINE (APRESOLINE) tablet 10 mg  10 mg Oral  Q6H PRN Shuvon Rankin, NP   10 mg at 03/09/12 1038  . LORazepam (ATIVAN) tablet 2 mg  2 mg Oral Q6H PRN Mojeed Akintayo   2 mg at 03/12/12 0051  . magnesium hydroxide (MILK OF MAGNESIA) suspension 30 mL  30 mL Oral Daily PRN Shuvon Rankin, NP      . menthol-cetylpyridinium (CEPACOL) lozenge 3 mg  1 lozenge Oral PRN Shuvon Rankin, NP      . Oxcarbazepine (TRILEPTAL) tablet 300 mg  300 mg Oral BID Mojeed Akintayo   300 mg at 03/12/12 1611    Lab Results: No results found for this or any previous visit (from the past 48 hour(s)).  Physical Findings: AIMS: Facial and Oral Movements Muscles of Facial Expression: None, normal Lips and Perioral Area: None, normal Jaw: None, normal Tongue: None, normal,Extremity Movements Upper (arms, wrists, hands, fingers): None, normal Lower (legs, knees, ankles, toes): None, normal, Trunk Movements Neck, shoulders, hips: None, normal, Overall Severity Severity of abnormal movements (highest score from questions above): None, normal Incapacitation due to abnormal movements: None, normal Patient's awareness of abnormal movements (rate only patient's report): No Awareness, Dental Status Current problems with teeth and/or dentures?: Yes Does patient usually wear dentures?: No  CIWA:    COWS:     Treatment Plan Summary: Daily contact with patient to assess and evaluate symptoms and progress in treatment Medication management  Plan: 1. Will continue patient on current medication regimen 2. Obtain collateral information. 3. Encourage patient to participates in unit activities and other milieu 4. Will increase the Haldol to 30mg  per day, ie 15mg  po BID. 5. Will start checking CBGs TID AC and HS. 6. Will attempt blood draw for HgbA1c when patient is more stable. 7. Will get daily weights to monitor for decreased oral intake. Medical Decision Making Problem Points:  Established problem, stable/improving (1), New problem, with additional work-up planned (4),  Review of last therapy session (1) and Review of psycho-social stressors (1) Data Points:  Order Aims Assessment (2) Review of medication regiment & side effects (2) Review of new medications or change in dosage (2)  I certify that inpatient services furnished can reasonably be expected to improve the patient's condition.  Rona Ravens. Marieelena Bartko RPAC 03/12/2012, 4:50 PM

## 2012-03-12 NOTE — Progress Notes (Signed)
Patient ID: Ardon Franklin, male   DOB: [birth year], 62 y.o.   MRN: [id number] Psychoeducational Group Note  Date:  03/12/2012 Time:0930am  Group Topic/Focus:  Identifying Needs:   The focus of this group is to help patients identify their personal needs that have been historically problematic and identify healthy behaviors to address their needs.  Participation Level:  Did Not Attend  Participation Quality:    Affect:  Cognitive:    Insight: Engagement in Group: Additional Comments:  Inventory group   [name] 03/12/2012,10:00 AM

## 2012-03-12 NOTE — Progress Notes (Signed)
Patient ID: David Lang, male   DOB: December 07, 1950, 62 y.o.   MRN: [id number]  D: Pt denies SI/HI/AVH. Pt continues to be argumentative, angry, agitated, with racial undertones. Pt continues to be disrespectful to staff, trying to be manipulative, and very bossy. Pt continues to be delusional and does not want to answer questions from "black man".  Pt complained about bed being "too small, not comfortable, I need another bed".   A: Pt was offered support and encouragement. Pt was given scheduled medications. Pt was encourage to attend groups. Q 15 minute checks were done for safety. Pt bed was adjusted and pt instructed to slide up in bed.    R:. Pt is taking medication.Pt not receptive to treatment, but complies with request and safety maintained on unit.

## 2012-03-12 NOTE — Progress Notes (Signed)
Patient ID: David Lang, male   DOB: Nov 14, 1950, 62 y.o.   MRN: [id number] D. The patient remained in bed most of the evening. His thoughts are disorganized and delusional. Has been heard talking loudly while alone in his room. His hygiene is poor and his appearance is disheveled. Very demanding and verbally aggressive, particularly with male staff. A. Encouraged to get out of bed and attend evening group. Offered evening snack. Offered help with ADL's.  R. The patient did not attend group. Out of bed only to use bathroom. Refused offer of help with ADL's. Yelled loudly at tech when he was offered food and drink to get out of his room. Will continue to monitor.

## 2012-03-12 NOTE — Progress Notes (Signed)
Adult Psychoeducational Group Note  Date:  03/12/2012 Time:  10:20  Group Topic/Focus:  Healthy Coping Skills  Participation Level:  Did Not Attend  Participation Quality:    Affect:   Cognitive:    Insight:   Engagement in Group:    Modes of Intervention:    Additional Comments:   Mariha Sleeper Sean 03/12/2012, 10:40 AM 

## 2012-03-12 NOTE — Clinical Social Work Note (Signed)
[facility] Group Notes: (Clinical Social Work)   03/12/2012      Type of Therapy:  Group Therapy   Participation Level:  Did Not Attend    Ambrose Mantle, LCSW 03/12/2012, 1:31 PM

## 2012-03-12 NOTE — Progress Notes (Addendum)
Patient ID: David Lang, male   DOB: 1950-07-25, 62 y.o.   MRN: [id number] 03-12-12 @ 1530 : nurse shift note: d: pt has not come out of his room today. He did take his morning medications, with resistant. He remains psychotic, but is redirectable. A: redirected the patient throughout the day. Will continue to encourage him to take med's.  cbg was order for the pt before meals and at bedtimeR: he has taken his medications and has had no complaints. He does have a tendency to be loud and demanding. He refused his cbg.  RN will monitor and  Q 15 min ck's continue.

## 2012-03-13 NOTE — Progress Notes (Signed)
Patient ID: David Lang, male   DOB: 1950/12/14, 62 y.o.   MRN: [id number] Psychoeducational Group Note  Date:  03/13/2012 Time:  1000am  Group Topic/Focus:  Making Healthy Choices:   The focus of this group is to help patients identify negative/unhealthy choices they were using prior to admission and identify positive/healthier coping strategies to replace them upon discharge.  Participation Level:  Did Not Attend  Participation Quality:    Affect: Cognitive:  Insight:  Engagement in Group:  Additional Comments: inventory group - unable to attend.   [name] 03/13/2012,9:51 AM

## 2012-03-13 NOTE — Progress Notes (Addendum)
Patient ID: David Lang, male   DOB: 12/04/1950, 62 y.o.   MRN: [id number] D. The patient spent the evening resting in bed with eyes closed. Remains disheveled and has poor hygiene. Minimal interaction with staff. Was incontinent of a large amount of diarrhea and urine in his bed. Remains very disorganized. His speech is loud, pressured and very demanding.  A.  Escorted to the bathroom and was cleaned by staff. 15 minute checks maintained for safety. R. The patient was not cooperative with staff when he was being cleaned of urine and stool. Refused to get into the shower.

## 2012-03-13 NOTE — Progress Notes (Signed)
Pt refused 1200 CBG 

## 2012-03-13 NOTE — Progress Notes (Addendum)
Pt is very loud and demanding. He does have body odor and at this time refuses to take a shower or bath stating,"Just wear a mask." pt did eat breakfast and was instructed he would need to bathe later today. Pt is paranoid and had to be given an explanation of all of his meds before taking them and then stated some looked different from what he was used to taking.pt for now stated he would remain in the bed and is refusing to go to group. Stated he hates his sister as she has forged his name on many documents and that the gov't was after him and that all people are conspiring to kill him. Reasssured pt that he was safe here. Denies SI or HI and contracts for safety.pt did have dried stool on his bed linen and was instructed he had to have his bed changed. Pt did cooperate with this,. He appears to prefer male interaction and therefore male staff present when staff nurse went into pts room.

## 2012-03-13 NOTE — Progress Notes (Signed)
Psychoeducational Group Note  Date:  03/13/2012 Time:  2000  Group Topic/Focus:  Wrap-Up Group:   The focus of this group is to help patients review their daily goal of treatment and discuss progress on daily workbooks.  Participation Level: Did Not Attend  Participation Quality:  Not Applicable  Affect:  Not Applicable  Cognitive:  Not Applicable  Insight:  Not Applicable  Engagement in Group: Not Applicable  Additional Comments:  The patient did not attend group this evening.   Hazle Coca S 03/13/2012, 9:47 PM

## 2012-03-13 NOTE — Progress Notes (Signed)
Psychoeducational Group Note  Date:  03/12/2012 Time:  2000  Group Topic/Focus:  Wrap-Up Group:   The focus of this group is to help patients review their daily goal of treatment and discuss progress on daily workbooks.  Participation Level: Did Not Attend  Participation Quality:  Not Applicable  Affect:  Not Applicable  Cognitive:  Not Applicable  Insight:  Not Applicable  Engagement in Group: Not Applicable  Additional Comments:  The patient did not attend group since he was asleep.   Kattleya Kuhnert S 03/13/2012, 1:09 AM

## 2012-03-13 NOTE — Progress Notes (Signed)
Patient ID: David Lang, male   DOB: [birth year], 62 y.o.   MRN: [id number] [facility] MD Progress Note  03/13/2012 12:56 PM David Lang  MRN:  [id number]  Subjective: David Lang continues to yell loudly at anyone who enters his room. He is disheveled and has refused to bathe. He has dried feces on the bed clothes and in the floor. He smells bad.  He is taking his antipsychotic medications and cogentin, but has refused his Norvasc and atenolol. He is taking meals in his room and is not attending groups.  Diagnosis:  Schizophrenia paranoid type                      Hypertension                      DM type 2  ADL's:  Impaired  Sleep: Fair  Appetite:  Fair  Suicidal Ideation:  Unable to assess;"I won't answer that" Homicidal Ideation:  Unable to assess;"I won't answer that" AEB (as evidenced by):   Psychiatric Specialty Exam: Review of Systems  Unable to perform ROS Constitutional: Negative.   HENT: Negative.   Eyes: Negative.   Respiratory: Negative.   Cardiovascular: Negative.   Gastrointestinal: Negative.   Genitourinary: Negative.   Musculoskeletal: Negative.   Skin: Negative.   Neurological: Negative.   Endo/Heme/Allergies: Negative.   Psychiatric/Behavioral: Positive for hallucinations.       Agitated    Blood pressure 114/70, pulse 70, temperature 96.9 F (36.1 C), temperature source Oral, resp. rate 16, height 5' 9.5" (1.765 m), weight 80.74 kg (178 lb), SpO2 98.00%.Body mass index is 25.92 kg/(m^2).  General Appearance: Disheveled  Eye Contact::  Poor  Speech:  Garbled and loud  Volume:  Increased  Mood:  Angry and Irritable  Affect:  Labile and Full Range  Thought Process:  Disorganized  Orientation:  Other:  unable to assess.  Thought Content:  Delusions and Hallucinations: talking to self  Suicidal Thoughts:  No  Homicidal Thoughts:  No  Memory:  unable to assess.  Judgement:  Poor  Insight:  Lacking  Psychomotor Activity:  Increased  Concentration:  Poor  Recall:   Poor  Akathisia:  No  Handed:  Right  AIMS (if indicated):     Assets:  Social Support  Sleep:  Number of Hours: 3   Current Medications: Current Facility-Administered Medications  Medication Dose Route Frequency Provider Last Rate Last Dose  . acetaminophen (TYLENOL) tablet 650 mg  650 mg Oral Q6H PRN Shuvon Rankin, NP      . alum & mag hydroxide-simeth (MAALOX/MYLANTA) 200-200-20 MG/5ML suspension 30 mL  30 mL Oral Q4H PRN Shuvon Rankin, NP      . amLODipine (NORVASC) tablet 10 mg  10 mg Oral Daily Shuvon Rankin, NP   10 mg at 03/12/12 0818  . aspirin EC tablet 325 mg  325 mg Oral Daily Sanjuana Kava, NP   325 mg at 03/12/12 0810  . atenolol (TENORMIN) tablet 100 mg  100 mg Oral Daily Shuvon Rankin, NP   100 mg at 03/12/12 0826  . benztropine (COGENTIN) tablet 1 mg  1 mg Oral BID Verne Spurr, PA-C   1 mg at 03/12/12 1620  . calcium carbonate (TUMS - dosed in mg elemental calcium) chewable tablet 200 mg of elemental calcium  1 tablet Oral Q4H PRN Kerry Hough, PA      . diphenhydrAMINE (BENADRYL) capsule 50 mg  50 mg Oral Q6H PRN Lloyd Huger  Alizzon Dioguardi, PA-C       Or  . diphenhydrAMINE (BENADRYL) injection 50 mg  50 mg Intramuscular Q6H PRN Verne Spurr, PA-C      . diphenhydrAMINE (BENADRYL) injection 50 mg  50 mg Intramuscular Once Mojeed Akintayo      . famotidine (PEPCID) tablet 20 mg  20 mg Oral Q12H PRN Kerry Hough, PA   20 mg at 03/09/12 2312  . haloperidol (HALDOL) tablet 15 mg  15 mg Oral BID Verne Spurr, PA-C   15 mg at 03/12/12 1620   Or  . haloperidol lactate (HALDOL) injection 15 mg  15 mg Intramuscular BID Verne Spurr, PA-C      . haloperidol (HALDOL) tablet 5 mg  5 mg Oral Q6H PRN Mojeed Akintayo      . hydrALAZINE (APRESOLINE) tablet 10 mg  10 mg Oral Q6H PRN Shuvon Rankin, NP   10 mg at 03/09/12 1038  . LORazepam (ATIVAN) tablet 2 mg  2 mg Oral Q6H PRN Mojeed Akintayo   2 mg at 03/12/12 0051  . magnesium hydroxide (MILK OF MAGNESIA) suspension 30 mL  30 mL Oral  Daily PRN Shuvon Rankin, NP      . menthol-cetylpyridinium (CEPACOL) lozenge 3 mg  1 lozenge Oral PRN Shuvon Rankin, NP      . Oxcarbazepine (TRILEPTAL) tablet 300 mg  300 mg Oral BID Mojeed Akintayo   300 mg at 03/12/12 1611    Lab Results: No results found for this or any previous visit (from the past 48 hour(s)).  Physical Findings: AIMS: Facial and Oral Movements Muscles of Facial Expression: None, normal Lips and Perioral Area: None, normal Jaw: None, normal Tongue: None, normal,Extremity Movements Upper (arms, wrists, hands, fingers): None, normal Lower (legs, knees, ankles, toes): None, normal, Trunk Movements Neck, shoulders, hips: None, normal, Overall Severity Severity of abnormal movements (highest score from questions above): None, normal Incapacitation due to abnormal movements: None, normal Patient's awareness of abnormal movements (rate only patient's report): No Awareness, Dental Status Current problems with teeth and/or dentures?: Yes Does patient usually wear dentures?: No  CIWA:    COWS:     Treatment Plan Summary: Daily contact with patient to assess and evaluate symptoms and progress in treatment Medication management  Plan: 1. Will continue patient on current medication regimen 2. RN staff and techs and housekeeping straightened the room up, and assisted the patient into a clean set of scrubs. 3. Encourage patient to participates in unit activities and other milieu 4. Continue the Haldol to 30mg  per day, ie 15mg  po BID. 5. Will hold off on blood draws at this time as the patient is refusing all blood work. 6. Will attempt blood draw for HgbA1c when patient is more stable. 7. Will get daily weights to monitor for decreased oral intake. Medical Decision Making Problem Points:  Established problem, stable/improving (1), New problem, with additional work-up planned (4), Review of last therapy session (1) and Review of psycho-social stressors (1) Data Points:   Order Aims Assessment (2) Review of medication regiment & side effects (2) Review of new medications or change in dosage (2)  I certify that inpatient services furnished can reasonably be expected to improve the patient's condition.  Rona Ravens. Sarae Nicholes RPAC 03/13/2012, 12:56 PM

## 2012-03-13 NOTE — Clinical Social Work Note (Signed)
[facility] Group Notes: (Clinical Social Work)   03/13/2012      Type of Therapy:  Group Therapy   Participation Level:  Did Not Attend    Ambrose Mantle, LCSW 03/13/2012, 12:35 PM

## 2012-03-14 DIAGNOSIS — F200 Paranoid schizophrenia: Secondary | ICD-10-CM

## 2012-03-14 NOTE — Progress Notes (Signed)
[facility] MD Progress Note  03/14/2012 4:20 PM David Lang  MRN:  [id number] Subjective:  Patient disorganized--"I am fine.  I don't have any disease.  I am seeing things, I am not seeing things.  My family is against me. I will never have depression not in my life time."  When asked about his sleep and appetite he responded with "Not sure", report from [facility]. Diagnosis:   Axis I: Chronic Paranoid Schizophrenia Axis II: Deferred Axis III:  Past Medical History  Diagnosis Date  . Hypertension   . Diabetes mellitus without complication   . Mental disorder   . Schizophrenia    Axis IV: other psychosocial or environmental problems, problems related to social environment and problems with primary support group Axis V: 41-50 serious symptoms  ADL's:  Intact  Sleep: Good  Appetite:  Good  Suicidal Ideation:  Denies  Homicidal Ideation:  Denies  Psychiatric Specialty Exam: Review of Systems  Constitutional: Negative.   HENT: Negative.   Eyes: Negative.   Respiratory: Negative.   Cardiovascular: Negative.   Gastrointestinal: Negative.   Genitourinary: Negative.   Musculoskeletal: Negative.   Skin: Negative.   Neurological: Negative.   Endo/Heme/Allergies: Negative.   Psychiatric/Behavioral: Negative.     Blood pressure 114/70, pulse 70, temperature 96.9 F (36.1 C), temperature source Oral, resp. rate 16, height 5' 9.5" (1.765 m), weight 80.74 kg (178 lb), SpO2 98.00%.Body mass index is 25.92 kg/(m^2).  General Appearance: Casual  Eye Contact::  Fair  Speech:  Normal Rate  Volume:  Normal  Mood:  Irritable  Affect:  Congruent  Thought Process:  Logical  Orientation:  Full (Time, Place, and Person)  Thought Content:  WDL  Suicidal Thoughts:  No  Homicidal Thoughts:  No  Memory:  Immediate;   Fair Recent;   Fair Remote;   Fair  Judgement:  Fair  Insight:  Fair  Psychomotor Activity:  Normal  Concentration:  Fair  Recall:  Fair  Akathisia:  No  Handed:  Right  AIMS (if  indicated):     Assets:  Resilience Social Support  Sleep:  Number of Hours: 3.75   Current Medications: Current Facility-Administered Medications  Medication Dose Route Frequency Provider Last Rate Last Dose  . acetaminophen (TYLENOL) tablet 650 mg  650 mg Oral Q6H PRN Shuvon Rankin, NP      . alum & mag hydroxide-simeth (MAALOX/MYLANTA) 200-200-20 MG/5ML suspension 30 mL  30 mL Oral Q4H PRN Shuvon Rankin, NP      . amLODipine (NORVASC) tablet 10 mg  10 mg Oral Daily Shuvon Rankin, NP   10 mg at 03/14/12 0910  . aspirin EC tablet 325 mg  325 mg Oral Daily Sanjuana Kava, NP   325 mg at 03/14/12 0908  . atenolol (TENORMIN) tablet 100 mg  100 mg Oral Daily Shuvon Rankin, NP   100 mg at 03/14/12 0910  . benztropine (COGENTIN) tablet 1 mg  1 mg Oral BID Verne Spurr, PA-C   1 mg at 03/14/12 0910  . calcium carbonate (TUMS - dosed in mg elemental calcium) chewable tablet 200 mg of elemental calcium  1 tablet Oral Q4H PRN Kerry Hough, PA      . diphenhydrAMINE (BENADRYL) capsule 50 mg  50 mg Oral Q6H PRN Verne Spurr, PA-C       Or  . diphenhydrAMINE (BENADRYL) injection 50 mg  50 mg Intramuscular Q6H PRN Verne Spurr, PA-C      . diphenhydrAMINE (BENADRYL) injection 50 mg  50 mg  Intramuscular Once Mojeed Akintayo      . famotidine (PEPCID) tablet 20 mg  20 mg Oral Q12H PRN Kerry Hough, PA   20 mg at 03/09/12 2312  . haloperidol (HALDOL) tablet 15 mg  15 mg Oral BID Verne Spurr, PA-C   15 mg at 03/14/12 1610   Or  . haloperidol lactate (HALDOL) injection 15 mg  15 mg Intramuscular BID Verne Spurr, PA-C      . haloperidol (HALDOL) tablet 5 mg  5 mg Oral Q6H PRN Mojeed Akintayo      . hydrALAZINE (APRESOLINE) tablet 10 mg  10 mg Oral Q6H PRN Shuvon Rankin, NP   10 mg at 03/09/12 1038  . LORazepam (ATIVAN) tablet 2 mg  2 mg Oral Q6H PRN Mojeed Akintayo   2 mg at 03/12/12 0051  . magnesium hydroxide (MILK OF MAGNESIA) suspension 30 mL  30 mL Oral Daily PRN Shuvon Rankin, NP      .  menthol-cetylpyridinium (CEPACOL) lozenge 3 mg  1 lozenge Oral PRN Shuvon Rankin, NP      . Oxcarbazepine (TRILEPTAL) tablet 300 mg  300 mg Oral BID Mojeed Akintayo   300 mg at 03/14/12 9604    Lab Results: No results found for this or any previous visit (from the past 48 hour(s)).  Physical Findings: AIMS: Facial and Oral Movements Muscles of Facial Expression: None, normal Lips and Perioral Area: None, normal Jaw: None, normal Tongue: None, normal,Extremity Movements Upper (arms, wrists, hands, fingers): None, normal Lower (legs, knees, ankles, toes): None, normal, Trunk Movements Neck, shoulders, hips: None, normal, Overall Severity Severity of abnormal movements (highest score from questions above): None, normal Incapacitation due to abnormal movements: None, normal Patient's awareness of abnormal movements (rate only patient's report): No Awareness, Dental Status Current problems with teeth and/or dentures?: Yes Does patient usually wear dentures?: No  CIWA:    COWS:     Treatment Plan Summary: Daily contact with patient to assess and evaluate symptoms and progress in treatment Medication management  Plan:  Review of chart, vital signs, medications, and notes. 1-Individual and group therapy 2-Medication management for psychosis:  Medications reviewed with the patient and he denies any physical issues 3-Coping skills for chronic mental illness 4-Continue crisis stabilization and management 5-Address health issues--stable 6-Treatment plan in progress to prevent relapse of psychosis  Medical Decision Making Problem Points:  Established problem, stable/improving (1) and Review of psycho-social stressors (1) Data Points:  Review of medication regiment & side effects (2)  I certify that inpatient services furnished can reasonably be expected to improve the patient's condition.   Nanine Means, PMH-NP 03/14/2012, 4:20 PM

## 2012-03-14 NOTE — Progress Notes (Signed)
Pt denies SI/HI. Pt denies pain and show no s/s of distress. Pt continues to talk loudly to staff. Pt continues to be demanding, demanding staff to bring him things. Pt is compliant with meds. Pt refuses to allow staff to take his CBG. Pt has unsteady gait and asked [facility] for a walker. MD will be made aware of pt request. Pt refuses to bathe or clean up after himself. Pt has poor hygiene and a body odor.  Medications administered as ordered per MD. Verbal support given. Pt continues to need redirecting from staff. 15 minute checks performed routinely for safety.  Pt is very demanding, controlling and irritable,

## 2012-03-14 NOTE — Clinical Social Work Note (Signed)
  Type of Therapy: Process Group Therapy  Participation Level:  Did Not Attend     Summary of Progress/Problems: Today's group addressed the issue of overcoming obstacles.  Patients were asked to identify their biggest obstacle post d/c that stands in the way of their on-going success, and then problem solve as to how to manage this.       Daryel Gerald B 03/14/2012   3:22 PM

## 2012-03-14 NOTE — Progress Notes (Signed)
[facility] LCSW Aftercare Discharge Planning Group Note  03/14/2012 3:20 PM  Participation Quality:  Did not attend   Ida Rogue 03/14/2012, 3:20 PM

## 2012-03-15 NOTE — Progress Notes (Signed)
[facility] LCSW Aftercare Discharge Planning Group Note  03/15/2012 2:55 PM  Participation Quality:  Did not attend  A:  Ida Rogue 03/15/2012, 2:55 PM

## 2012-03-15 NOTE — Progress Notes (Signed)
D:  Patient stayed in his room most of the day.  Did get up for group this morning.  Took medications as ordered.  Continues to refuse blood glucose testing.  Denies suicidal thoughts.   A:  Medications given as ordered.  Patient has not bathed today and is looking quite disheveled.  Continues to request sandwiches for lunch daily.   R:  Continues to be loud and demanding, but is compliant with medications.  Interacting some with peers.

## 2012-03-15 NOTE — Treatment Plan (Signed)
Interdisciplinary Treatment Plan Update (Adult)  Date: 03/15/2012  Time Reviewed: 2:50 PM   Progress in Treatment: Attending groups: No Participating in groups: No Taking medication as prescribed: Yes Tolerating medication: Yes  Family/Significant other contact made:  Yes Patient understands diagnosis:  No No insight Discussing patient identified problems/goals with staff:  Yes  See below Medical problems stabilized or resolved:  Yes Denies suicidal/homicidal ideation: Yes  In tx team Issues/concerns per patient self-inventory:  Not filled out Other:  New problem(s) identified: N/A  Reason for Continuation of Hospitalization: Hallucinations Medication stabilization  Interventions implemented related to continuation of hospitalization: Second opinion received to force meds  Pt taking all medications willingly except for Haldol  Is getting it by injection.  Dosage was increased last Friday   Will consider giving injection when stable  Encourage group attendance and participation  Additional comments: [facility] has been approved.  They will release number when they get a note from Dr stating he is psychiatrically stable  Estimated length of stay: 5-10 days  Discharge Plan: unclear-possible ALF placement per family insistence- requested [facility] evaluation  New goal(s): N/A  Review of initial/current patient goals per problem list:   1.  Goal(s): Eliminate psychosis  Met:  No  Target date:2/21  As evidenced by:Today David Lang declares that there is no reason for him to be here, that there is nothing wrong, and that he needs to be discharged immediately.    He is loud and easily agitated.  He is disorganized in his thinking, and also experiencing delusions and paranoia  2.  Goal (s): David Lang will take medication as prescribed  Met:  No  Target date:2/21  As evidenced [id number] is still refusing PO haldol, so getting it IM  3.  Goal(s): Identify outpt provider  Met:  No  Target  date:2/21  As evidenced [id number] of appointment time and date  Provider will be dependent on disposition at d/c, which is yet to be determined  4.  Goal(s):  Met:  No  Target date:  As evidenced by:  Attendees: Patient:     Family:     Physician:  Thedore Mins 03/15/2012 2:50 PM   Nursing:  Joslyn Devon  03/15/2012 2:50 PM   Clinical Social Worker:  Richelle Ito 03/15/2012 2:50 PM   Extender:  Verne Spurr PA 03/15/2012 2:50 PM   Other:     Other:     Other:     Other:      Scribe for Treatment Team:   Ida Rogue, 03/15/2012 2:50 PM

## 2012-03-15 NOTE — Progress Notes (Signed)
Patient ID: David Lang, male   DOB: 1950-06-08, 62 y.o.   MRN: [id number] [facility] MD Progress Note  03/15/2012 11:32 AM David Lang  MRN:  [id number]  Subjective:"I don't know". David Lang does continue to yell loudly at staff and other patients. He continues to have poor hygiene and refuses to bathe. He states "I don't know" to all questions.  He threatens to sue [place] all and calls the hospital a "Concentration Camp." Diagnosis:  Schizophrenia paranoid type                      Hypertension                      DM type 2  ADL's:  Impaired  Sleep: poor 3.25 hours  Appetite:  Fair  Suicidal Ideation:  Unable to assess;"I won't answer that" Homicidal Ideation:  Unable to assess;"I won't answer that" AEB (as evidenced by):   Psychiatric Specialty Exam: Review of Systems  Unable to perform ROS Constitutional: Negative.   HENT: Negative.   Eyes: Negative.   Respiratory: Negative.   Cardiovascular: Negative.   Gastrointestinal: Negative.   Genitourinary: Negative.   Musculoskeletal: Negative.   Skin: Negative.   Neurological: Negative.   Endo/Heme/Allergies: Negative.   Psychiatric/Behavioral: Positive for hallucinations.       Agitated    Blood pressure 171/88, pulse 69, temperature 98.1 F (36.7 C), temperature source Oral, resp. rate 17, height 5' 9.5" (1.765 m), weight 80.74 kg (178 lb), SpO2 98.00%.Body mass index is 25.92 kg/(m^2).  General Appearance: Disheveled  Eye Contact::  Poor  Speech:  Garbled and loud He does respond to each question, but states I don't know to most of them.  Volume:  Increased  Mood:  Angry and Irritable  Affect:  Labile and Full Range  Thought Process:  Disorganized  Orientation:  Other:  unable to assess.  Thought Content:  Delusions and Hallucinations: talking to self  Suicidal Thoughts:  No  Homicidal Thoughts:  No  Memory:  unable to assess.  Judgement:  Poor  Insight:  Lacking  Psychomotor Activity:  Increased  Concentration:  Poor  Recall:   Poor  Akathisia:  No  Handed:  Right  AIMS (if indicated):     Assets: minimal Sister is in [place]  Sleep:  Number of Hours: 3.5   Current Medications: Current Facility-Administered Medications  Medication Dose Route Frequency Provider Last Rate Last Dose  . acetaminophen (TYLENOL) tablet 650 mg  650 mg Oral Q6H PRN Shuvon Rankin, NP      . alum & mag hydroxide-simeth (MAALOX/MYLANTA) 200-200-20 MG/5ML suspension 30 mL  30 mL Oral Q4H PRN Shuvon Rankin, NP      . amLODipine (NORVASC) tablet 10 mg  10 mg Oral Daily Shuvon Rankin, NP   10 mg at 03/12/12 0818  . aspirin EC tablet 325 mg  325 mg Oral Daily Sanjuana Kava, NP   325 mg at 03/12/12 0810  . atenolol (TENORMIN) tablet 100 mg  100 mg Oral Daily Shuvon Rankin, NP   100 mg at 03/12/12 0826  . benztropine (COGENTIN) tablet 1 mg  1 mg Oral BID Verne Spurr, PA-C   1 mg at 03/12/12 1620  . calcium carbonate (TUMS - dosed in mg elemental calcium) chewable tablet 200 mg of elemental calcium  1 tablet Oral Q4H PRN Kerry Hough, PA      . diphenhydrAMINE (BENADRYL) capsule 50 mg  50 mg Oral Q6H PRN  Verne Spurr, PA-C       Or  . diphenhydrAMINE (BENADRYL) injection 50 mg  50 mg Intramuscular Q6H PRN Verne Spurr, PA-C      . diphenhydrAMINE (BENADRYL) injection 50 mg  50 mg Intramuscular Once Mojeed Akintayo      . famotidine (PEPCID) tablet 20 mg  20 mg Oral Q12H PRN Kerry Hough, PA   20 mg at 03/09/12 2312  . haloperidol (HALDOL) tablet 15 mg  15 mg Oral BID Verne Spurr, PA-C   15 mg at 03/12/12 1620   Or  . haloperidol lactate (HALDOL) injection 15 mg  15 mg Intramuscular BID Verne Spurr, PA-C      . haloperidol (HALDOL) tablet 5 mg  5 mg Oral Q6H PRN Mojeed Akintayo      . hydrALAZINE (APRESOLINE) tablet 10 mg  10 mg Oral Q6H PRN Shuvon Rankin, NP   10 mg at 03/09/12 1038  . LORazepam (ATIVAN) tablet 2 mg  2 mg Oral Q6H PRN Mojeed Akintayo   2 mg at 03/12/12 0051  . magnesium hydroxide (MILK OF MAGNESIA) suspension 30  mL  30 mL Oral Daily PRN Shuvon Rankin, NP      . menthol-cetylpyridinium (CEPACOL) lozenge 3 mg  1 lozenge Oral PRN Shuvon Rankin, NP      . Oxcarbazepine (TRILEPTAL) tablet 300 mg  300 mg Oral BID Mojeed Akintayo   300 mg at 03/12/12 1611    Lab Results: No results found for this or any previous visit (from the past 48 hour(s)).  Physical Findings: AIMS: Facial and Oral Movements Muscles of Facial Expression: None, normal Lips and Perioral Area: None, normal Jaw: None, normal Tongue: None, normal,Extremity Movements Upper (arms, wrists, hands, fingers): None, normal Lower (legs, knees, ankles, toes): None, normal, Trunk Movements Neck, shoulders, hips: None, normal, Overall Severity Severity of abnormal movements (highest score from questions above): None, normal Incapacitation due to abnormal movements: None, normal Patient's awareness of abnormal movements (rate only patient's report): No Awareness, Dental Status Current problems with teeth and/or dentures?: Yes Does patient usually wear dentures?: No  CIWA:    COWS:     Treatment Plan Summary: Daily contact with patient to assess and evaluate symptoms and progress in treatment Medication management  Plan: 1. Will continue patient on current medication regimen 2. Will ask male tech to assist patient with shower or bath today if possible. 3. Encourage patient to participates in unit activities and other milieu. 4. Continue the Haldol to 30mg  per day, ie 15mg  po BID. 5. Will get daily weights to monitor for decreased oral intake. Medical Decision Making Problem Points:  Established problem, stable/improving (1), New problem, with additional work-up planned (4), Review of last therapy session (1) and Review of psycho-social stressors (1) Data Points:  Order Aims Assessment (2) Review of medication regiment & side effects (2) Review of new medications or change in dosage (2)  I certify that inpatient services furnished can  reasonably be expected to improve the patient's condition.  Rona Ravens. Timtohy Broski RPAC 03/15/2012, 11:32 AM

## 2012-03-15 NOTE — Clinical Social Work Note (Signed)
[facility] LCSW Group Therapy  03/15/2012 , 2:56 PM   Type of Therapy:  Group Therapy  Participation Level:  Did not attend    Summary of Progress/Problems: Today's group focused on the term Diagnosis.  Participants were asked to define the term, and then pronounce whether it is a negative, positive or neutral term.  Daryel Gerald B 03/15/2012 , 2:56 PM

## 2012-03-15 NOTE — Progress Notes (Signed)
Patient ID: David Lang, male   DOB: 12-19-1950, 62 y.o.   MRN: [id number] D: Pt. Lying in bed, demanding breakfast "I want breakfast now, I want italian bread, with cucumbers, no tomato". Pt. Proceeded to tell writer how he want items arranged on his bread. Pt. Disorganized at times and speech pressured, loud and not understandable. A: Writer introduced self to client and oriented him to time breakfast would be served. Staff will monitor q53min for safety. R: Pt. Ignored writer introduction and proceeded to tell her about breakfast. Pt. Is safe on the unit.

## 2012-03-15 NOTE — Progress Notes (Signed)
Patient ID: David Lang, male   DOB: 05-04-1950, 62 y.o.   MRN: [id number] D: pt. In bed eyes closed, resp. Even. A: Writer observed pt. For s/s of distress. Staff will monitor q45min. For safety. R: Pt. Is safe on the unit, no distress noted.

## 2012-03-15 NOTE — Progress Notes (Signed)
Patient ID: David Lang, male   DOB: [birth year], 62 y.o.   MRN: [id number]  D: Pt denies SI/HI/AVH. Pt is disruptive with loud pressured speech. Pt still demanding, rude, and disrespectful. Pt defecated on self and floor along with urinating on floor.    A: Pt was offered support and encouragement. Pt was given scheduled medications. Pt was encourage to attend groups. Q 15 minute checks were done for safety. Pt given Ativan at 2035. Pt was cleaned and washed. Pt bed linen was changed and bed disinfected, pt was given a ginger ale.    R:Pt does not attend groups and  Does not interact well with peers and staff. Pt is taking medication. Pt not  receptive to treatment , but safety maintained on unit. Pt was very cooperative with the clean up.

## 2012-03-16 NOTE — Progress Notes (Addendum)
D: Patient denies SI/HI and auditory and visual hallucinations. The patient has an irritable mood and affect. The patient is not attending groups and refuses to fill out his self-inventory sheets. The patient isolates frequently to his room throughout the day. The patient displays a disorganized thought process and still yells to communicate with staff. Patient is refusing to have blood sugar checks and also refused to have an EKG done.  A: Patient given emotional support from RN. Patient encouraged to come to staff with concerns and/or questions. Patient's medication routine continued. Patient's orders and plan of care reviewed.  R: Patient remains safe. Will continue to monitor patient q15 minutes for safety.

## 2012-03-16 NOTE — Progress Notes (Signed)
Recreation Therapy Notes  Date: 02.19.2014  Time: 9:30am      Group Topic/Focus: Leisure Education  Participation Level: Did not attend   Raysa Bosak L Taline Nass, LRT/CTRS 

## 2012-03-16 NOTE — Progress Notes (Signed)
Psychoeducational Group Note  Date:  03/16/2012 Time:  2000  Group Topic/Focus:  Wrap-Up Group:   The focus of this group is to help patients review their daily goal of treatment and discuss progress on daily workbooks.  Participation Level: Did Not Attend  Participation Quality:  Not Applicable  Affect:  Not Applicable  Cognitive:  Not Applicable  Insight:  Not Applicable  Engagement in Group: Not Applicable  Additional Comments:  Pt did not attend.  Christ Kick 03/16/2012, 9:32 PM

## 2012-03-16 NOTE — Progress Notes (Signed)
Patient ID: David Lang, male   DOB: 03-25-1950, 62 y.o.   MRN: [id number] [facility] MD Progress Note  03/16/2012 9:26 AM David Lang  MRN:  [id number]  Subjective:Patient appears to be less agitated today even though he continue to speak very loud. But he remains paranoid,  oppositional and defiant. He continues to have poor hygiene and refuses to shower. Now patient refuses to use the bathroom and has been passing urine on himself even though he is not sedated. Diagnosis:  Schizophrenia paranoid type                      Hypertension                      DM type 2  ADL's:  Impaired  Sleep: poor 3.25 hours  Appetite:  Fair  Suicidal Ideation:  Unable to assess;"I won't answer that" Homicidal Ideation:  Unable to assess;"I won't answer that" AEB (as evidenced by):   Psychiatric Specialty Exam: Review of Systems  Unable to perform ROS Constitutional: Negative.   HENT: Negative.   Eyes: Negative.   Respiratory: Negative.   Cardiovascular: Negative.   Gastrointestinal: Negative.   Genitourinary: Negative.   Musculoskeletal: Negative.   Skin: Negative.   Neurological: Negative.   Endo/Heme/Allergies: Negative.   Psychiatric/Behavioral: Positive for hallucinations.       Agitated    Blood pressure 160/89, pulse 71, temperature 98.5 F (36.9 C), temperature source Oral, resp. rate 16, height 5' 9.5" (1.765 m), weight 80.74 kg (178 lb), SpO2 98.00%.Body mass index is 25.92 kg/(m^2).  General Appearance: Disheveled  Eye Contact::  Poor  Speech:  Garbled and loud He does respond to each question, but states I don't know to most of them.  Volume:  Increased  Mood:  Angry and Irritable  Affect:  Labile and Full Range  Thought Process:  Disorganized  Orientation:  Other:  unable to assess.  Thought Content:  Delusions and Hallucinations: talking to self  Suicidal Thoughts:  No  Homicidal Thoughts:  No  Memory:  unable to assess.  Judgement:  Poor  Insight:  Lacking  Psychomotor  Activity:  Increased  Concentration:  Poor  Recall:  Poor  Akathisia:  No  Handed:  Right  AIMS (if indicated):     Assets: minimal Sister is in [place]  Sleep:  Number of Hours: 5.25   Current Medications: Current Facility-Administered Medications  Medication Dose Route Frequency Provider Last Rate Last Dose  . acetaminophen (TYLENOL) tablet 650 mg  650 mg Oral Q6H PRN David Rankin, NP      . alum & mag hydroxide-simeth (MAALOX/MYLANTA) 200-200-20 MG/5ML suspension 30 mL  30 mL Oral Q4H PRN David Rankin, NP      . amLODipine (NORVASC) tablet 10 mg  10 mg Oral Daily David Rankin, NP   10 mg at 03/12/12 0818  . aspirin EC tablet 325 mg  325 mg Oral Daily David Kava, NP   325 mg at 03/12/12 0810  . atenolol (TENORMIN) tablet 100 mg  100 mg Oral Daily David Rankin, NP   100 mg at 03/12/12 0826  . benztropine (COGENTIN) tablet 1 mg  1 mg Oral BID David Spurr, PA-C   1 mg at 03/12/12 1620  . calcium carbonate (TUMS - dosed in mg elemental calcium) chewable tablet 200 mg of elemental calcium  1 tablet Oral Q4H PRN David Hough, PA      . diphenhydrAMINE (BENADRYL) capsule 50  mg  50 mg Oral Q6H PRN David Spurr, PA-C       Or  . diphenhydrAMINE (BENADRYL) injection 50 mg  50 mg Intramuscular Q6H PRN David Spurr, PA-C      . diphenhydrAMINE (BENADRYL) injection 50 mg  50 mg Intramuscular Once David Lang      . famotidine (PEPCID) tablet 20 mg  20 mg Oral Q12H PRN David Hough, PA   20 mg at 03/09/12 2312  . haloperidol (HALDOL) tablet 15 mg  15 mg Oral BID David Spurr, PA-C   15 mg at 03/12/12 1620   Or  . haloperidol lactate (HALDOL) injection 15 mg  15 mg Intramuscular BID David Spurr, PA-C      . haloperidol (HALDOL) tablet 5 mg  5 mg Oral Q6H PRN David Lang      . hydrALAZINE (APRESOLINE) tablet 10 mg  10 mg Oral Q6H PRN David Rankin, NP   10 mg at 03/09/12 1038  . LORazepam (ATIVAN) tablet 2 mg  2 mg Oral Q6H PRN David Lang   2 mg at 03/12/12 0051   . magnesium hydroxide (MILK OF MAGNESIA) suspension 30 mL  30 mL Oral Daily PRN David Rankin, NP      . menthol-cetylpyridinium (CEPACOL) lozenge 3 mg  1 lozenge Oral PRN David Rankin, NP      . Oxcarbazepine (TRILEPTAL) tablet 300 mg  300 mg Oral BID Shanyah Gattuso   300 mg at 03/12/12 1611    Lab Results: No results found for this or any previous visit (from the past 48 hour(s)).  Physical Findings: AIMS: Facial and Oral Movements Muscles of Facial Expression: None, normal Lips and Perioral Area: None, normal Jaw: None, normal Tongue: None, normal,Extremity Movements Upper (arms, wrists, hands, fingers): None, normal Lower (legs, knees, ankles, toes): None, normal, Trunk Movements Neck, shoulders, hips: None, normal, Overall Severity Severity of abnormal movements (highest score from questions above): None, normal Incapacitation due to abnormal movements: None, normal Patient's awareness of abnormal movements (rate only patient's report): No Awareness, Dental Status Current problems with teeth and/or dentures?: Yes Does patient usually wear dentures?: No  CIWA:    COWS:     Treatment Plan Summary: Daily contact with patient to assess and evaluate symptoms and progress in treatment Medication management  Plan: 1. Will continue patient on current medication regimen 2. Will ask male tech to assist patient with shower or bath today if possible. 3. Encourage patient to participates in unit activities and other milieu. 4. Continue the Haldol to 30mg  per day, ie 15mg  po BID. 5. Will get daily weights to monitor for decreased oral intake. Medical Decision Making Problem Points:  Established problem, stable/improving (1), New problem, with additional work-up planned (4), Review of last therapy session (1) and Review of psycho-social stressors (1) Data Points:  Order Aims Assessment (2) Review of medication regiment & side effects (2) Review of new medications or change in dosage  (2)  I certify that inpatient services furnished can reasonably be expected to improve the patient's condition.  David Belleau,MD 03/16/2012, 9:26 AM

## 2012-03-16 NOTE — Progress Notes (Signed)
Patient seems disorganized; patient asked to allow RNs to perform EKG and patient presented as tangential and started ranting and stated that his "heart is an organ" and that "no good will come from [place]". Patient refused EKG. Will continue to monitor patient for safety.

## 2012-03-16 NOTE — Progress Notes (Signed)
[facility] LCSW Aftercare Discharge Planning Group Note  03/16/2012 1:53 PM  Participation Quality:  Did not attend    Charline Hoskinson B 03/16/2012, 1:53 PM 

## 2012-03-16 NOTE — Consult Note (Signed)
[facility] Medical Consultation  Darrick Greenlaw ION:[id number] DOB: 1950/05/27 DOA: 03/04/2012 PCP: Default, Provider, MD   Requesting physician: Dr Jannifer Franklin Date of consultation: 2/19 Reason for consultation: ? Stool and urinary incontinence  Impression/Recommendations Principal Problem: ?Urinary and stool incontinence -Pt was reported earlier to have been ?incontinent but for the rest of the day per nsg he has been continent -able to use the toilet for both stool and urine when he has needed to as per nsg this pm. - Pt was mostly unco-operative with exam- and would not allow rectal to check rectal tone -would obtain UA and if he has any further loose stools obtain PCR for C Diff and stool culture -Monitor closely for further episodes and if persists after his paranoid schizophrenia is better controlled with meds per psych, then he would need to be futher evaluated as clinically appropropriate Schizophrenia, paranoid type -per psych Active Problems:   Non compliance w medication regimen -per psych      [facility] will followup again tomorrow. Please contact us if we can be of assistance in the meanwhile. Thank you for this consultation.    HPI:  The Pt is 62yo [place] male with history of  Paranoid schizophrenia, HTN, DM, medication noncompliance admitted to [facility] for inpatient treatment;  following discharge from the [facility] Internal Medicine Teaching Service where he was hospitalized for AKI. [facility] was asked to see pt for ?urinary and stool incontinence which he had in the past but had resolved per [facility] . History is obtained from nsg staff due to pt's mental status. Per staff present this pm pt has been able to go to the bathroom- for both stool and urine all day today without any difficulty, but that last night/early this am he had loose stool and was unable to get to the bathroom, and that he had a similar episode over the weekend. Per nsg pt knows when he needs to go to the  bathroom and is able to ambulate without difficulty to use the bathroom. No fever or urinary frequency reported. Pt has also been refusing labs draws per staff and he is uncooperative with most of the interview an physical exam today.  Pt states 'it is just a conspiracy trying to brain wash me into thinking that I am sick and need medical care when I do not.'  Review of Systems:  No reports of, fever, weight loss,, vision loss, hoarseness, chest pain, syncope, dyspnea on exertion, peripheral edema, balance deficits, hemoptysis, abdominal pain, melena, hematochezia.  Past Medical History  Diagnosis Date  . Hypertension   . Diabetes mellitus without complication   . Mental disorder   . Schizophrenia    History reviewed. No pertinent past surgical history. Social History:  reports that he has never smoked. He does not have any smokeless tobacco history on file. He reports that he does not drink alcohol or use illicit drugs.  Family History - unobtainable secondary to his mental status  Prior to Admission medications   Medication Sig Start Date End Date Taking? Authorizing Provider  aspirin 325 MG EC tablet Take 325 mg by mouth daily.   Yes Historical Provider, MD   Physical Exam: Blood pressure 160/89, pulse 71, temperature 98.5 F (36.9 C), temperature source Oral, resp. rate 16, height 5' 9.5" (1.765 m), weight 80.74 kg (178 lb), SpO2 98.00%. Filed Vitals:   03/16/12 0600 03/16/12 0601 03/16/12 0846 03/16/12 0847  BP: 160/89 148/88 160/89   Pulse: 71 76  71  Temp:  98.5 F (36.9 C)     TempSrc: Oral     Resp: 16     Height:      Weight:      SpO2:        Constitutional: Vital signs reviewed.  Patient is a well-developed dishevelled older male in no acute distress. He is mostly uncooperative   with exam. Alert and oriented x2.  Head: Normocephalic and atraumatic Eyes: PERRL, EOMI, conjunctivae normal, No scleral icterus.  Neck: Supple, Trachea midline normal ROM, No JVD, mass,  thyromegaly, or carotid bruit present.  Cardiovascular: RRR, S1 normal, S2 normal, no MRG, pulses symmetric and intact bilaterally Pulmonary/Chest: Clear- but limited as pt will not take deep breaths as directed  Abdominal: Soft. Non-tender, non-distended, bowel sounds are normal, no masses, organomegaly, or guarding present.  Neuro: Pt refuses to follow commands Psychiatric: flat affect, paranoid, irritable. Poor insight.     Labs on Admission:  Basic Metabolic Panel: No results found for this basename: NA, K, CL, CO2, GLUCOSE, BUN, CREATININE, CALCIUM, MG, PHOS,  in the last 168 hours Liver Function Tests: No results found for this basename: AST, ALT, ALKPHOS, BILITOT, PROT, ALBUMIN,  in the last 168 hours No results found for this basename: LIPASE, AMYLASE,  in the last 168 hours No results found for this basename: AMMONIA,  in the last 168 hours CBC: No results found for this basename: WBC, NEUTROABS, HGB, HCT, MCV, PLT,  in the last 168 hours Cardiac Enzymes: No results found for this basename: CKTOTAL, CKMB, CKMBINDEX, TROPONINI,  in the last 168 hours BNP: No components found with this basename: POCBNP,  CBG: No results found for this basename: GLUCAP,  in the last 168 hours  Radiological Exams on Admission: No results found.    Time spent: >30      Kela Millin [facility] Pager [phone]  If 7PM-7AM, please contact night-coverage [email or web address] Password [name] 03/16/2012, 7:24 PM

## 2012-03-16 NOTE — Progress Notes (Signed)
Seen and agreed. Ziana Heyliger, MD 

## 2012-03-17 DIAGNOSIS — E119 Type 2 diabetes mellitus without complications: Secondary | ICD-10-CM

## 2012-03-17 MED ORDER — CLONAZEPAM 0.5 MG PO TBDP: 0.5000 mg | ORAL_TABLET | Freq: Three times a day (TID) | ORAL | Status: DC

## 2012-03-17 NOTE — Treatment Plan (Signed)
  Interdisciplinary Treatment Plan Update   Date Reviewed:  03/17/2012  Time Reviewed:  8:36 AM  Progress in Treatment:   Attending groups: No Participating in groups: No Taking medication as prescribed: Yes  Tolerating medication: Yes Family/Significant other contact made: Yes  Patient understands diagnosis: No  Little insight  Discussing patient identified problems/goals with staff: Yes Medical problems stabilized or resolved: Yes Denies suicidal/homicidal ideation: Yes Patient has not harmed self or others: Yes  For review of initial/current patient goals, please see plan of care.  Estimated Length of Stay:    Reason for Continuation of Hospitalization: Hallucinations Medication stabilization  New Problems/Goals identified:  N/A  Discharge Plan or Barriers:   Identify locked ALF that is willing to take Merlyn Albert  Additional Comments:  Attendees:  Signature: Thedore Mins, MD 03/17/2012 8:36 AM   Signature: Richelle Ito, LCSW 03/17/2012 8:36 AM  Signature: Verne Spurr, PA 03/17/2012 8:36 AM  Signature: Joslyn Devon, RN 03/17/2012 8:36 AM  Signature: Nestor Ramp, RN  03/17/2012 8:36 AM  Signature:  03/17/2012 8:36 AM  Signature:   03/17/2012 8:36 AM  Signature:    Signature:    Signature:    Signature:    Signature:    Signature:      Scribe for Treatment Team:   Richelle Ito, LCSW  03/17/2012 8:36 AM

## 2012-03-17 NOTE — Progress Notes (Signed)
Patient ID: David Lang, male   DOB: 05-30-1950, 62 y.o.   MRN: [id number] [facility] MD Progress Note  03/17/2012 12:42 PM Hakeem Frazzini  MRN:  [id number]  Subjective:Patient appears to be less agitated today even though he continue to speak very loud. But he remains paranoid,  oppositional and defiant. He continues to have poor hygiene and refuses to shower. An Arabic interpreter was engaged to see if David Lang would feel more comfortable and be more cooperative, and hopefully provide more information. This was not successful. David Lang was loud and agitated today demanding that I call the President of the [place] so he could lodge a complaint. He declined any blood testing stating that it was "too stressfull" for him.  He also threatened me with M[place]. He continues to be paranoid, unflexible, and defiant. Diagnosis:  Schizophrenia paranoid type                      Hypertension                      DM type 2  ADL's:  Impaired  Sleep: poor 3.25 hours  Appetite:  Fair  Suicidal Ideation:  Unable to assess;"I won't answer that" Homicidal Ideation:  Unable to assess;"I won't answer that" AEB (as evidenced by):   Psychiatric Specialty Exam: Review of Systems  Unable to perform ROS Constitutional: Negative.   HENT: Negative.   Eyes: Negative.   Respiratory: Negative.   Cardiovascular: Negative.   Gastrointestinal: Negative.   Genitourinary: Negative.   Musculoskeletal: Negative.   Skin: Negative.   Neurological: Negative.   Endo/Heme/Allergies: Negative.   Psychiatric/Behavioral: Positive for hallucinations.       Agitated    Blood pressure 153/78, pulse 68, temperature 98.5 F (36.9 C), temperature source Oral, resp. rate 16, height 5' 9.5" (1.765 m), weight 80.74 kg (178 lb), SpO2 98.00%.Body mass index is 25.92 kg/(m^2).  General Appearance: Disheveled  Eye Contact::  Poor  Speech:  Garbled and loud He does respond to each question but in Arabic, some [place].   Volume:   Increased  Mood:  Angry and Irritable  Affect:  Labile and Full Range  Thought Process:  Disorganized  Orientation:  Other:  unable to assess.  Thought Content:  Delusions and Hallucinations: talking to self  Suicidal Thoughts:  No  Homicidal Thoughts:  No  Memory:  unable to assess.  Judgement:  Poor  Insight:  Lacking  Psychomotor Activity:  Increased  Concentration:  Poor  Recall:  Poor  Akathisia:  No  Handed:  Right  AIMS (if indicated):     Assets: minimal Sister is in [place] and 1 in [place]  Sleep:  Number of Hours: 6.75   Current Medications: Current Facility-Administered Medications  Medication Dose Route Frequency Provider Last Rate Last Dose  . acetaminophen (TYLENOL) tablet 650 mg  650 mg Oral Q6H PRN Shuvon Rankin, NP      . alum & mag hydroxide-simeth (MAALOX/MYLANTA) 200-200-20 MG/5ML suspension 30 mL  30 mL Oral Q4H PRN Shuvon Rankin, NP      . amLODipine (NORVASC) tablet 10 mg  10 mg Oral Daily Shuvon Rankin, NP   10 mg at 03/12/12 0818  . aspirin EC tablet 325 mg  325 mg Oral Daily Sanjuana Kava, NP   325 mg at 03/12/12 0810  . atenolol (TENORMIN) tablet 100 mg  100 mg Oral Daily Shuvon Rankin, NP   100 mg at 03/12/12 0826  .  benztropine (COGENTIN) tablet 1 mg  1 mg Oral BID Verne Spurr, PA-C   1 mg at 03/12/12 1620  . calcium carbonate (TUMS - dosed in mg elemental calcium) chewable tablet 200 mg of elemental calcium  1 tablet Oral Q4H PRN Kerry Hough, PA      . diphenhydrAMINE (BENADRYL) capsule 50 mg  50 mg Oral Q6H PRN Verne Spurr, PA-C       Or  . diphenhydrAMINE (BENADRYL) injection 50 mg  50 mg Intramuscular Q6H PRN Verne Spurr, PA-C      . diphenhydrAMINE (BENADRYL) injection 50 mg  50 mg Intramuscular Once Mojeed Akintayo      . famotidine (PEPCID) tablet 20 mg  20 mg Oral Q12H PRN Kerry Hough, PA   20 mg at 03/09/12 2312  . haloperidol (HALDOL) tablet 15 mg  15 mg Oral BID Verne Spurr, PA-C   15 mg at 03/12/12 1620   Or  .  haloperidol lactate (HALDOL) injection 15 mg  15 mg Intramuscular BID Verne Spurr, PA-C      . haloperidol (HALDOL) tablet 5 mg  5 mg Oral Q6H PRN Mojeed Akintayo      . hydrALAZINE (APRESOLINE) tablet 10 mg  10 mg Oral Q6H PRN Shuvon Rankin, NP   10 mg at 03/09/12 1038  . LORazepam (ATIVAN) tablet 2 mg  2 mg Oral Q6H PRN Mojeed Akintayo   2 mg at 03/12/12 0051  . magnesium hydroxide (MILK OF MAGNESIA) suspension 30 mL  30 mL Oral Daily PRN Shuvon Rankin, NP      . menthol-cetylpyridinium (CEPACOL) lozenge 3 mg  1 lozenge Oral PRN Shuvon Rankin, NP      . Oxcarbazepine (TRILEPTAL) tablet 300 mg  300 mg Oral BID Mojeed Akintayo   300 mg at 03/12/12 1611    Lab Results: No results found for this or any previous visit (from the past 48 hour(s)).  Physical Findings: AIMS: Facial and Oral Movements Muscles of Facial Expression: None, normal Lips and Perioral Area: None, normal Jaw: None, normal Tongue: None, normal,Extremity Movements Upper (arms, wrists, hands, fingers): None, normal Lower (legs, knees, ankles, toes): None, normal, Trunk Movements Neck, shoulders, hips: None, normal, Overall Severity Severity of abnormal movements (highest score from questions above): None, normal Incapacitation due to abnormal movements: None, normal Patient's awareness of abnormal movements (rate only patient's report): No Awareness, Dental Status Current problems with teeth and/or dentures?: Yes Does patient usually wear dentures?: No  CIWA:    COWS:     Treatment Plan Summary: Daily contact with patient to assess and evaluate symptoms and progress in treatment Medication management  Plan: 1. Will continue patient on current medication regimen 2. Will ask male tech to assist patient with shower or bath today if possible. 3. Encourage patient to participates in unit activities and other milieu. 4. Continue the Haldol to 30mg  per day, ie 15mg  po BID. 5. Will get daily weights to monitor for  decreased oral intake. 6. Would consider adding a newer antipsychotic ?Abilify possibly? 7. Would recommend assisted living facility placement (locked). Medical Decision Making Problem Points:  Established problem, stable/improving (1), New problem, with additional work-up planned (4), Review of last therapy session (1) and Review of psycho-social stressors (1) Data Points:  Order Aims Assessment (2) Review of medication regiment & side effects (2) Review of new medications or change in dosage (2)  I certify that inpatient services furnished can reasonably be expected to improve the patient's condition.   Lloyd Huger  Francoise Schaumann RPAC 12:50 PM 03/17/2012

## 2012-03-17 NOTE — Clinical Social Work Note (Signed)
[facility] Group Notes:  (Counselor/Nursing/MHT/Case Management/Adjunct)  12/10/2011 2:20 PM  Type of Therapy:  Group Therapy  Participation Level:  Did Not Attend    Summary of Progress/Problems: .balance: The topic for group was balance in life.  Pt participated in the discussion about when their life was in balance and out of balance and how this feels.  Pt discussed ways to get back in balance and short term goals they can work on to get where they want to be.    Sanvi Ehler B 12/10/2011, 2:20 PM  

## 2012-03-17 NOTE — Progress Notes (Signed)
[facility] LCSW Aftercare Discharge Planning Group Note  03/17/2012 3:08 PM  Participation Quality:  Did not attend    David Lang 03/17/2012, 3:08 PM

## 2012-03-17 NOTE — Progress Notes (Signed)
Pt. Refused CBG. 

## 2012-03-17 NOTE — Progress Notes (Signed)
Patient ID: David Lang, male   DOB: 20-Dec-1950, 62 y.o.   MRN: [id number]  [facility] PROGRESS NOTE  David Lang HYQ:[id number] DOB: 04/16/1950 DOA: 03/04/2012 PCP: Default, Provider, MD  Brief narrative: The Pt is 62 yo [place] male with history of Paranoid schizophrenia, HTN, DM, medication noncompliance admitted to [facility] for inpatient treatment; following discharge from the [facility] Internal Medicine Teaching Service where he was hospitalized for AKI. [facility] was asked to see pt for ?urinary and stool incontinence which he had in the past but had resolved per [facility] . History is obtained from nsg staff due to pt's mental status. Pt has refused blood work and no data available to date.   Principal Problem:   Schizophrenia, paranoid type - per primary team Active Problems:   Incontinence - pt refusing blood work and refusing to provide urine and stool samples for analysis - will attempt again today or in am  Procedures/Studies:  None  Antibiotics:  None  Code Status: Full Family Communication: Pt at bedside Disposition Plan: Home when medically stable  HPI/Subjective: No events overnight.   Objective: Filed Vitals:   03/16/12 0846 03/16/12 0847 03/17/12 0855 03/17/12 0856  BP: 160/89  153/78   Pulse:  71  68  Temp:      TempSrc:      Resp:      Height:      Weight:      SpO2:       No intake or output data in the 24 hours ending 03/17/12 1310  Exam:   General:  Pt is alert, refusing examination  Data Reviewed: Basic Metabolic Panel: No results found for this basename: NA, K, CL, CO2, GLUCOSE, BUN, CREATININE, CALCIUM, MG, PHOS,  in the last 168 hours Liver Function Tests: No results found for this basename: AST, ALT, ALKPHOS, BILITOT, PROT, ALBUMIN,  in the last 168 hours No results found for this basename: LIPASE, AMYLASE,  in the last 168 hours No results found for this basename: AMMONIA,  in the last 168 hours CBC: No results found for this basename: WBC,  NEUTROABS, HGB, HCT, MCV, PLT,  in the last 168 hours Cardiac Enzymes: No results found for this basename: CKTOTAL, CKMB, CKMBINDEX, TROPONINI,  in the last 168 hours BNP: No components found with this basename: POCBNP,  CBG: No results found for this basename: GLUCAP,  in the last 168 hours  No results found for this or any previous visit (from the past 240 hour(s)).   Scheduled Meds: . amLODipine  10 mg Oral Daily  . aspirin  325 mg Oral Daily  . atenolol  100 mg Oral Daily  . benztropine  1 mg Oral BID  . diphenhydrAMINE  50 mg Intramuscular Once  . haloperidol  15 mg Oral BID  . haloperidol lactate  15 mg Intramuscular BID  . OXcarbazepine  300 mg Oral BID   Continuous Infusions:    Debbora Presto, MD  [facility] Pager [phone]  If 7PM-7AM, please contact night-coverage [email or web address] Password [email or web address] 03/17/2012, 1:10 PM   LOS: 13 days

## 2012-03-17 NOTE — Progress Notes (Signed)
Patient ID: David Lang, male   DOB: [birth year], 62 y.o.   MRN: [id number] D: Pt. Lying in bed upon initial visit. "I don't want medicine, I don't need treatment, get out of here"  Later during the evening pt. is visible on the unit. Demanding meds for indigestion "I want a capsule" Pt. Loud, pointing fingers. A: Writer introduced self to client. Medications administerted as requested. (see Mar) Staff will monitor q71min for safety. R: Pt. Is safe on the unit.

## 2012-03-17 NOTE — Progress Notes (Addendum)
D: Patient denies SI/HI and auditory and visual hallucinations. The patient has an irritable mood and affect. The patient displays paranoid behaviors at times and will comment to staff "I saw you put something into my water!" Patient also believes that staff are drugging him through food and beverages ("I know what you're up to with that salad!"). The patient is not attending groups and refuses to fill out his self-inventory sheets. The patient isolates frequently to his room throughout the day. The patient displays a disorganized thought process and still yells to communicate with staff. Patient is still refusing to labs drawn, to have an EKG done and to have his CBGs done.  A: Patient given emotional support from RN. Patient encouraged to come to staff with concerns and/or questions. Patient's medication routine continued. Patient's orders and plan of care reviewed. MD and PA notified of patient's refusal.  R: Patient remains safe. No new orders given. Will continue to monitor patient q15 minutes for safety.

## 2012-03-17 NOTE — Progress Notes (Signed)
Patient refusing lab draws (and to give stool and urine samples), CBGs, and an EKG. RN attempted to give patient education but patient states that he "doesn't care." MD and PA have been notified and are aware.

## 2012-03-17 NOTE — Progress Notes (Signed)
Psychoeducational Group Note  Date:  03/17/2012 Time:  0930  Group Topic/Focus:  Self Esteem Action Plan:   The focus of this group is to help patients create a plan to continue to build self-esteem after discharge.  Participation Level: Did Not Attend  Participation Quality:  Not Applicable  Affect:  Not Applicable  Cognitive:  Not Applicable  Insight:  Not Applicable  Engagement in Group: Not Applicable  Additional Comments:  Pt refused to attend group this morning.  Mammie Meras E 03/17/2012, 11:23 AM

## 2012-03-18 MED ORDER — CLONAZEPAM 0.5 MG PO TABS
0.5000 mg | ORAL_TABLET | Freq: Three times a day (TID) | ORAL | Status: DC
  Administered 2012-03-18 – 2012-03-28 (×27): 0.5 mg via ORAL
  Filled 2012-03-18 (×31): qty 1

## 2012-03-18 NOTE — Clinical Social Work Note (Signed)
[facility] LCSW Group Therapy  03/18/2012 2:59 PM   Type of Therapy:  Group Therapy  Participation Level:  Did not attend    Summary of Progress/Problems: Today's group focused on relapse prevention.  We defined the term, and then brainstormed on ways to prevent relapse.  Daryel Gerald B 03/18/2012 , 2:59 PM

## 2012-03-18 NOTE — Progress Notes (Signed)
Patient ID: David Lang, male   DOB: 1950/10/18, 62 y.o.   MRN: [id number] [facility] MD Progress Note  03/17/2012 12:42 PM Otoniel Myhand  MRN:  [id number]  Subjective:Patient appears to be sleepy and drowsy today. Notes indicate he only slept for 1+ hours last night.  He continues to rant at patients and staff, continues to refuse blood draws or exams, continues to demand discharge and to call the President of the [place] to file complaint. States that "all these questions are useless, who do you think you are psychiatrist?" Merlyn Albert then states he is tired and wants to sleep.   Diagnosis:  Schizophrenia paranoid type                      Hypertension                      DM type 2  ADL's:  Impaired  Sleep: poor 3.25 hours  Appetite:  Fair  Suicidal Ideation:  Unable to assess;"I won't answer that" Homicidal Ideation:  Unable to assess;"I won't answer that" AEB (as evidenced by):   Psychiatric Specialty Exam: Review of Systems  Unable to perform ROS Constitutional: Negative.   HENT: Negative.   Eyes: Negative.   Respiratory: Negative.   Cardiovascular: Negative.   Gastrointestinal: Negative.   Genitourinary: Negative.   Musculoskeletal: Negative.   Skin: Negative.   Neurological: Negative.   Endo/Heme/Allergies: Negative.   Psychiatric/Behavioral: Positive for hallucinations.       Agitated    Blood pressure 153/78, pulse 68, temperature 98.5 F (36.9 C), temperature source Oral, resp. rate 16, height 5' 9.5" (1.765 m), weight 80.74 kg (178 lb), SpO2 98.00%.Body mass index is 25.92 kg/(m^2).  General Appearance: Disheveled  Eye Contact::  Poor  Speech:  Garbled and loud. States "I don't know."   Volume:  Increased  Mood:  Sleepy and drowsy  Affect:  Labile and Full Range  Thought Process:  Disorganized  Orientation:  Other:  unable to assess.  Thought Content:  Delusions and Hallucinations: talking to self  Suicidal Thoughts:  No  Homicidal Thoughts:  No  Memory:  unable  to assess.  Judgement:  Poor  Insight:  Lacking  Psychomotor Activity:  decreased  Concentration:  Poor  Recall:  Poor  Akathisia:  No  Handed:  Right  AIMS (if indicated):     Assets: minimal Sister is in [place] and 1 in [place]  Sleep:  Number of Hours: 6.75   Current Medications: Current Facility-Administered Medications  Medication Dose Route Frequency Provider Last Rate Last Dose  . acetaminophen (TYLENOL) tablet 650 mg  650 mg Oral Q6H PRN Shuvon Rankin, NP      . alum & mag hydroxide-simeth (MAALOX/MYLANTA) 200-200-20 MG/5ML suspension 30 mL  30 mL Oral Q4H PRN Shuvon Rankin, NP      . amLODipine (NORVASC) tablet 10 mg  10 mg Oral Daily Shuvon Rankin, NP   10 mg at 03/12/12 0818  . aspirin EC tablet 325 mg  325 mg Oral Daily Sanjuana Kava, NP   325 mg at 03/12/12 0810  . atenolol (TENORMIN) tablet 100 mg  100 mg Oral Daily Shuvon Rankin, NP   100 mg at 03/12/12 0826  . benztropine (COGENTIN) tablet 1 mg  1 mg Oral BID Verne Spurr, PA-C   1 mg at 03/12/12 1620  . calcium carbonate (TUMS - dosed in mg elemental calcium) chewable tablet 200 mg of elemental calcium  1 tablet  Oral Q4H PRN Kerry Hough, PA      . diphenhydrAMINE (BENADRYL) capsule 50 mg  50 mg Oral Q6H PRN Verne Spurr, PA-C       Or  . diphenhydrAMINE (BENADRYL) injection 50 mg  50 mg Intramuscular Q6H PRN Verne Spurr, PA-C      . diphenhydrAMINE (BENADRYL) injection 50 mg  50 mg Intramuscular Once Mojeed Akintayo      . famotidine (PEPCID) tablet 20 mg  20 mg Oral Q12H PRN Kerry Hough, PA   20 mg at 03/09/12 2312  . haloperidol (HALDOL) tablet 15 mg  15 mg Oral BID Verne Spurr, PA-C   15 mg at 03/12/12 1620   Or  . haloperidol lactate (HALDOL) injection 15 mg  15 mg Intramuscular BID Verne Spurr, PA-C      . haloperidol (HALDOL) tablet 5 mg  5 mg Oral Q6H PRN Mojeed Akintayo      . hydrALAZINE (APRESOLINE) tablet 10 mg  10 mg Oral Q6H PRN Shuvon Rankin, NP   10 mg at 03/09/12 1038  .  LORazepam (ATIVAN) tablet 2 mg  2 mg Oral Q6H PRN Mojeed Akintayo   2 mg at 03/12/12 0051  . magnesium hydroxide (MILK OF MAGNESIA) suspension 30 mL  30 mL Oral Daily PRN Shuvon Rankin, NP      . menthol-cetylpyridinium (CEPACOL) lozenge 3 mg  1 lozenge Oral PRN Shuvon Rankin, NP      . Oxcarbazepine (TRILEPTAL) tablet 300 mg  300 mg Oral BID Mojeed Akintayo   300 mg at 03/12/12 1611    Lab Results: No results found for this or any previous visit (from the past 48 hour(s)).  Physical Findings: AIMS: Facial and Oral Movements Muscles of Facial Expression: None, normal Lips and Perioral Area: None, normal Jaw: None, normal Tongue: None, normal,Extremity Movements Upper (arms, wrists, hands, fingers): None, normal Lower (legs, knees, ankles, toes): None, normal, Trunk Movements Neck, shoulders, hips: None, normal, Overall Severity Severity of abnormal movements (highest score from questions above): None, normal Incapacitation due to abnormal movements: None, normal Patient's awareness of abnormal movements (rate only patient's report): No Awareness, Dental Status Current problems with teeth and/or dentures?: Yes Does patient usually wear dentures?: No  CIWA:    COWS:     Treatment Plan Summary: Daily contact with patient to assess and evaluate symptoms and progress in treatment Medication management  Plan: 1. Will continue patient on current medication regimen 2. Will ask male tech to assist patient with shower or bath today if possible. 3. Encourage patient to participates in unit activities and other milieu. 4. Continue the Haldol to 30mg  per day, ie 15mg  po BID. 5. Will get daily weights to monitor for decreased oral intake. 6. Will hold off on any further medication changes other than the Klonopin to TID scheduled until further assessment can be made regarding his CMP and glucose. 7. CM is speaking with family to see if patient is at the baseline, a nephew will visit today.   8.  Plans are moving forward with placement, and FL2 is being faxed out to different facilities for placement per CM. 9. Currently investigating "forced med policy and the need to "force labs." " to provide the optimum patient care.  Medical Decision Making Problem Points:  Established problem, stable/improving (1), New problem, with additional work-up planned (4), Review of last therapy session (1) and Review of psycho-social stressors (1) Data Points:  Order Aims Assessment (2) Review of medication regiment & side  effects (2) Review of new medications or change in dosage (2)  I certify that inpatient services furnished can reasonably be expected to improve the patient's condition.   Rona Ravens. Collan Schoenfeld RPAC 12:38 PM 03/18/2012

## 2012-03-18 NOTE — Progress Notes (Signed)
Recreation Therapy Notes  03/18/2012         Time: 9:30am      Group Topic/Focus: Self Expression  Participation Level: None  Participation Quality: Other:None  Affect: Labile and Irritable   Cognitive: Disoriented   Additional Comments: Patient was seated in a chair in 400 hall day room when LRT arrived to unit. Patient was seated with legs crossed and left hand covering face. LRT walked into day room and introduced herself to patients in the day room, LRT attempted to shake patients hand. Patient closed eyes and refused to interact with LRT. Patient offered the opportunity to leave day room so he did not have to participate in group session, patient refused to look at or interact with LRT. Patient remained in day room for entire group session. Patient was offered activity to participate in, patient refused. Patient and peer engaged in conversation. LRT was unable to hear the contents of the conversation. Patient peer groomed patient, by petting patient head, fluffing patient beard, and rubbing fingers over patient toe nails. Patient yelled at LRT "let her do it if she wants to." Patient stated additional inaudible and unintelligible statements to LRT. LRT reassured patient that she was not going to take patient peer away from him. Patient yelled at LRT "I don't want anyone hurt here" LRT assured patient she was not here to hurt anyone and that no one in this hospital is here to hurt anyone. Patient mumbled response, but LRT was unable to understand patient response. LRT sat down adjacent to patient to speak with patient peer. Patient stated to LRT "Do you know what it means for your cup to run over?" "Do you? Do You?" Patient spoke to LRT about the beginning of World War I (WWI). Patient stated WWI was started because they were trying to medicate a man. Patient instructed LRT to look it up on the website.   Patient agitated, hostile and labile throughout recreation therapy session.  Marykay Lex  Jaylissa Felty, LRT/CTRS    Adeleigh Barletta L 03/18/2012 1:05 PM

## 2012-03-18 NOTE — Progress Notes (Signed)
David Lang remains delusional, paranoid, easily becomes agitated, has a labile affect as well as labile behaviors. David Lang  speak, rather David Lang yells whatever David Lang wants to communicate in a loud booming voice.  David Lang frequently refuses staff to check is vital signs, obtain CBG's etc.   A David Lang took  his AM meds at 1100 this morning and was checked for cheeking and this was not found.David Lang has spent most of his day, resting in his bed.\   R Safety is in place and POC cont.

## 2012-03-18 NOTE — Progress Notes (Signed)
Patient ID: David Lang, male   DOB: 04/10/1950, 62 y.o.   MRN: [id number] D: Patient in room on approach. Pt presented with irritable mood and affect.  Pt is demanding and asked to leave the hospital immediately. Pt refused to put briefs on and demanded writer go to the store and purchase him one. Pt denies SI/HI/AVH and pain. Pt denied evening CBG drawing. Cooperative with assessment. No acute distressed noted at this time.   A: Met with pt 1:1.  Writer encouraged pt to discuss feelings. Pt encouraged to come to staff with any question or concerns.  R: Patient remains safe. Continue current POC.

## 2012-03-18 NOTE — Progress Notes (Signed)
Patient ID: David Lang, male   DOB: [birth year], 62 y.o.   MRN: [id number] D: Patient in room on approach. Pt presented with irritable mood and affect.  Pt is demanding and asked to leave the hospital immediately. Pt denies SI/HI/AVH and pain. Pt denied evening CBG drawing. Cooperative with assessment. No acute distressed noted at this time.   A: Met with pt 1:1.  Writer encouraged pt to discuss feelings. Pt encouraged to come to staff with any question or concerns.  R: Patient remains safe. Continue current POC.

## 2012-03-18 NOTE — Progress Notes (Signed)
Pt still not willing to cooperate with examination, refusing blood work at this time and refusing to provide details on any medical concerns. Will sign off for now and please call me if still needed.  Debbora Presto, MD  [facility] Pager [phone] Cell phone [phone]  If 7PM-7AM, please contact night-coverage [email or web address] Password [email or web address]

## 2012-03-18 NOTE — Progress Notes (Signed)
[facility] LCSW Aftercare Discharge Planning Group Note  03/18/2012 11:03 AM  Participation Quality:  Did not attend    David Lang 03/18/2012, 11:03 AM

## 2012-03-19 NOTE — Progress Notes (Signed)
D   Pt has been in his room yelling and is very irritable   He poured water all over himself this morning and handed me back the pills i had given him  He took them a little later   He is very paranoid and asked for something to drink and for me to open it in front of him   He does not attend groups and has no interaction with others except to yell at them A   Verbal support given  Medications administered and effectiveness monitored   Q 15 min checks  Redirect as needed R   Pt safe at present

## 2012-03-19 NOTE — Clinical Social Work Note (Signed)
[facility] Group Notes: (Clinical Social Work)   03/19/2012      Type of Therapy:  Group Therapy   Participation Level:  Did Not Attend    Ambrose Mantle, LCSW 03/19/2012, 12:58 PM

## 2012-03-19 NOTE — Progress Notes (Signed)
Pt refused 1700 cbg

## 2012-03-19 NOTE — Progress Notes (Signed)
Adult Psychoeducational Group Note  Date:  03/19/2012 Time:  2000  Group Topic/Focus:  Wrap-Up Group:   The focus of this group is to help patients review their daily goal of treatment and discuss progress on daily workbooks.  Participation Level:  Did Not Attend  Participation Quality:    Affect:    Cognitive:    Insight:   Engagement in Group:    Modes of Intervention:    Additional Comments:  Pt refused to attend wrap-up group this evening.   Shawnelle Spoerl A 03/19/2012, 4:30 AM

## 2012-03-19 NOTE — Progress Notes (Signed)
Pt refused CBG.

## 2012-03-19 NOTE — Progress Notes (Signed)
Psychoeducational Group Note  Date:  03/19/2012 Time:  2000  Group Topic/Focus:  Wrap-Up Group:   The focus of this group is to help patients review their daily goal of treatment and discuss progress on daily workbooks.  Participation Level: Did Not Attend  Participation Quality:  Not Applicable  Affect:  Not Applicable  Cognitive:  Not Applicable  Insight:  Not Applicable  Engagement in Group: Not Applicable  Additional Comments:  Pt refused to attend group.  Christ Kick 03/19/2012, 9:07 PM

## 2012-03-19 NOTE — Progress Notes (Signed)
Patient ID: David Lang, male   DOB: Jun 11, 1950, 62 y.o.   MRN: [id number] [facility] MD Progress Note  Moo Gravley  MRN:  [id number]  Subjective: I don't need any psychiatrist.  I don't believe in chemicals.    Objective. Patient continues to be irritable and angry and in denial into his psychiatric illness.  He sleep on and off.  He continues to demand to be discharged.  He is grandiose , delusional paranoid and needs redirection.   Diagnosis:  Schizophrenia paranoid type                      Hypertension                      DM type 2  ADL's:  Impaired  Sleep: poor 3.25 hours  Appetite:  Fair  Suicidal Ideation:  Unable to assess;"I won't answer that" Homicidal Ideation:  Unable to assess;"I won't answer that" AEB (as evidenced by):   Psychiatric Specialty Exam: Review of Systems  Unable to perform ROS Constitutional: Negative.   HENT: Negative.   Eyes: Negative.   Respiratory: Negative.   Cardiovascular: Negative.   Gastrointestinal: Negative.   Genitourinary: Negative.   Musculoskeletal: Negative.   Skin: Negative.   Neurological: Negative.   Endo/Heme/Allergies: Negative.   Psychiatric/Behavioral: Positive for hallucinations.       Agitated    Blood pressure 153/78, pulse 68, temperature 98.5 F (36.9 C), temperature source Oral, resp. rate 16, height 5' 9.5" (1.765 m), weight 80.74 kg (178 lb), SpO2 98.00%.Body mass index is 25.92 kg/(m^2).  General Appearance: Disheveled  Eye Contact::  Poor  Speech:  Garbled and loud. States "I don't know."  Leave me alone.    Volume:  Increased  Mood:  Sleepy and drowsy  Affect:  Labile and Full Range  Thought Process:  Disorganized  Orientation:  Other:  unable to assess.  Thought Content:  Delusions and Hallucinations: talking to self  Suicidal Thoughts:  No  Homicidal Thoughts:  No  Memory:  unable to assess.  Judgement:  Poor  Insight:  Lacking  Psychomotor Activity:  decreased  Concentration:  Poor  Recall:  Poor   Akathisia:  No  Handed:  Right  AIMS (if indicated):     Assets: minimal Sister is in [place] and 1 in [place]  Sleep:  Number of Hours: 6.75   Current Medications: Current Facility-Administered Medications  Medication Dose Route Frequency Provider Last Rate Last Dose  . acetaminophen (TYLENOL) tablet 650 mg  650 mg Oral Q6H PRN Shuvon Rankin, NP      . alum & mag hydroxide-simeth (MAALOX/MYLANTA) 200-200-20 MG/5ML suspension 30 mL  30 mL Oral Q4H PRN Shuvon Rankin, NP      . amLODipine (NORVASC) tablet 10 mg  10 mg Oral Daily Shuvon Rankin, NP   10 mg at 03/12/12 0818  . aspirin EC tablet 325 mg  325 mg Oral Daily Sanjuana Kava, NP   325 mg at 03/12/12 0810  . atenolol (TENORMIN) tablet 100 mg  100 mg Oral Daily Shuvon Rankin, NP   100 mg at 03/12/12 0826  . benztropine (COGENTIN) tablet 1 mg  1 mg Oral BID Verne Spurr, PA-C   1 mg at 03/12/12 1620  . calcium carbonate (TUMS - dosed in mg elemental calcium) chewable tablet 200 mg of elemental calcium  1 tablet Oral Q4H PRN Kerry Hough, PA      . diphenhydrAMINE (BENADRYL) capsule 50 mg  50 mg Oral Q6H PRN Verne Spurr, PA-C       Or  . diphenhydrAMINE (BENADRYL) injection 50 mg  50 mg Intramuscular Q6H PRN Verne Spurr, PA-C      . diphenhydrAMINE (BENADRYL) injection 50 mg  50 mg Intramuscular Once Mojeed Akintayo      . famotidine (PEPCID) tablet 20 mg  20 mg Oral Q12H PRN Kerry Hough, PA   20 mg at 03/09/12 2312  . haloperidol (HALDOL) tablet 15 mg  15 mg Oral BID Verne Spurr, PA-C   15 mg at 03/12/12 1620   Or  . haloperidol lactate (HALDOL) injection 15 mg  15 mg Intramuscular BID Verne Spurr, PA-C      . haloperidol (HALDOL) tablet 5 mg  5 mg Oral Q6H PRN Mojeed Akintayo      . hydrALAZINE (APRESOLINE) tablet 10 mg  10 mg Oral Q6H PRN Shuvon Rankin, NP   10 mg at 03/09/12 1038  . LORazepam (ATIVAN) tablet 2 mg  2 mg Oral Q6H PRN Mojeed Akintayo   2 mg at 03/12/12 0051  . magnesium hydroxide (MILK OF MAGNESIA)  suspension 30 mL  30 mL Oral Daily PRN Shuvon Rankin, NP      . menthol-cetylpyridinium (CEPACOL) lozenge 3 mg  1 lozenge Oral PRN Shuvon Rankin, NP      . Oxcarbazepine (TRILEPTAL) tablet 300 mg  300 mg Oral BID Mojeed Akintayo   300 mg at 03/12/12 1611    Lab Results: No results found for this or any previous visit (from the past 48 hour(s)).  Physical Findings: AIMS: Facial and Oral Movements Muscles of Facial Expression: None, normal Lips and Perioral Area: None, normal Jaw: None, normal Tongue: None, normal,Extremity Movements Upper (arms, wrists, hands, fingers): None, normal Lower (legs, knees, ankles, toes): None, normal, Trunk Movements Neck, shoulders, hips: None, normal, Overall Severity Severity of abnormal movements (highest score from questions above): None, normal Incapacitation due to abnormal movements: None, normal Patient's awareness of abnormal movements (rate only patient's report): No Awareness, Dental Status Current problems with teeth and/or dentures?: Yes Does patient usually wear dentures?: No  CIWA:    COWS:     Treatment Plan  Daily contact the patient to assess and evaluate symptoms and progress in the treatment. Continue current medication . Encourage pressure to participate in group therapy. Encourage him to involved in his ADLs. Continue to follow up on plan with case manager to get in touch with the family about his baseline. Monitor the progress.  Problem Points:  Established problem, stable/improving (1), New problem, with additional work-up planned (4), Review of last therapy session (1) and Review of psycho-social stressors (1) Data Points:  Order Aims Assessment (2) Review of medication regiment & side effects (2) Review of new medications or change in dosage (2)  I certify that inpatient services furnished can reasonably be expected to improve the patient's condition.   Kathryne Sharper, MD 10:51 AM 03/19/2012

## 2012-03-20 NOTE — Plan of Care (Signed)
Problem: Alteration in thought process Goal: LTG-Patient behavior demonstrates decreased signs psychosis David Lang is consistently demonstrating paranoia and disorganized thinking.  Outcome: Not Progressing Pt continues to mumble and talk as responding to internal stimuli. Goal: LTG-Patient verbalizes understanding importance med regimen (Patient verbalizes understanding of importance of medication regimen and need to continue outpatient care.)  Outcome: Not Progressing Pt is disorganized in thinking and unable to process at this time. Goal: STG-Patient is able to discuss thoughts with staff Outcome: Not Progressing Pt disorganized in thoughts Goal: STG-Patient does not respond to command hallucinations Outcome: Not Progressing Pt responding to internal stimuli

## 2012-03-20 NOTE — Clinical Social Work Note (Signed)
[facility] Group Notes: (Clinical Social Work)   03/20/2012      Type of Therapy:  Group Therapy   Participation Level:  Did Not Attend    Ambrose Mantle, LCSW 03/20/2012, 12:27 PM

## 2012-03-20 NOTE — Progress Notes (Signed)
David Lang remains isolative...spending most of his day sleeping in his bed or sitting in his chair in his room. HE yells when he communicates..he gets VERY agitated VERY quickly if whomever he's speking to has difficulty understanding him. He took his AM and PM PO meds...after much discussion and encouragement from nursing. HE adamantly refused to take his 1200 dose of klonopin.  A He does not attend groups, he is not engaged in his recovery. HE refuses vitals signs to be taken and cbg's to be done on him.  R Safety is in place and POC cont.

## 2012-03-20 NOTE — Progress Notes (Signed)
Patient ID: David Lang, male   DOB: [birth year], 62 y.o.   MRN: [id number] D. The patient is disheveled with body odor and poor hygiene. He is very loud and demanding. Speech is rapid and pressured. Requested to hire an assistant from [facility] to help with his needs. C/o of acid reflux and requested Tums. A. Met with patient 1:1. The patient needed constant gentle redirection to lower his voice. Offered and received Maalox for acid reflux. Explained to patient that the team is working on a discharge plan to address his needs. R. Responded well to redirection. Compliant with medication. Was not able to attend evening group. Refused assistance to ambulate to the bathroom.

## 2012-03-20 NOTE — Progress Notes (Signed)
Patient ID: David Lang, male   DOB: Sep 02, 1950, 62 y.o.   MRN: [id number] [name]  MRN:  [id number]  Subjective: I don't have any psychiatric illness.  I don't believe in chemicals.    Objective. Patient seen and chart reviewed.  He remains irritable and angry and in denial into his psychiatric illness.  He continues to resist taking his psychiatric medication.  He needs reassurance, and direction for his medication.  He was loud angry and delusional.  He continues to demand for discharge.  He remains grandiose and needs redirection and medication compliance.     Diagnosis:  Schizophrenia paranoid type                      Hypertension                      DM type 2  ADL's:  Impaired  Sleep: poor 3.25 hours  Appetite:  Fair  Suicidal Ideation:  Unable to assess;"I won't answer that" Homicidal Ideation:  Unable to assess;"I won't answer that" AEB (as evidenced by):   Psychiatric Specialty Exam: Review of Systems  Unable to perform ROS Constitutional: Negative.   HENT: Negative.   Eyes: Negative.   Respiratory: Negative.   Cardiovascular: Negative.   Gastrointestinal: Negative.   Genitourinary: Negative.   Musculoskeletal: Negative.   Skin: Negative.   Neurological: Negative.   Endo/Heme/Allergies: Negative.   Psychiatric/Behavioral: Positive for hallucinations.  Grandiose, paranoid and easily irritable.   Blood pressure 153/78, pulse 68, temperature 98.5 F (36.9 C), temperature source Oral, resp. rate 16, height 5' 9.5" (1.765 m), weight 80.74 kg (178 lb), SpO2 98.00%.Body mass index is 25.92 kg/(m^2).  General Appearance: Disheveled  Eye Contact::  Poor  Speech:  Pressured loud and incoherent.      Volume:  Increased  Mood:  Sleepy and drowsy  Affect:  Labile and Full Range  Thought Process:  Disorganized  Orientation:  Other:  unable to assess.  Thought Content:  Delusions and Hallucinations: talking to self  Suicidal Thoughts:  No   Homicidal Thoughts:  No  Memory:  unable to assess.  Judgement:  Poor  Insight:  Lacking  Psychomotor Activity:  decreased  Concentration:  Poor  Recall:  Poor  Akathisia:  No  Handed:  Right  AIMS (if indicated):     Assets: minimal Sister is in [place] and 1 in [place]  Sleep:  Number of Hours: 6.75   Current Medications: Current Facility-Administered Medications  Medication Dose Route Frequency Provider Last Rate Last Dose  . acetaminophen (TYLENOL) tablet 650 mg  650 mg Oral Q6H PRN Shuvon Rankin, NP      . alum & mag hydroxide-simeth (MAALOX/MYLANTA) 200-200-20 MG/5ML suspension 30 mL  30 mL Oral Q4H PRN Shuvon Rankin, NP      . amLODipine (NORVASC) tablet 10 mg  10 mg Oral Daily Shuvon Rankin, NP   10 mg at 03/12/12 0818  . aspirin EC tablet 325 mg  325 mg Oral Daily Sanjuana Kava, NP   325 mg at 03/12/12 0810  . atenolol (TENORMIN) tablet 100 mg  100 mg Oral Daily Shuvon Rankin, NP   100 mg at 03/12/12 0826  . benztropine (COGENTIN) tablet 1 mg  1 mg Oral BID Verne Spurr, PA-C   1 mg at 03/12/12 1620  . calcium carbonate (TUMS - dosed in mg elemental calcium) chewable tablet 200 mg of elemental calcium  1 tablet  Oral Q4H PRN Kerry Hough, PA      . diphenhydrAMINE (BENADRYL) capsule 50 mg  50 mg Oral Q6H PRN Verne Spurr, PA-C       Or  . diphenhydrAMINE (BENADRYL) injection 50 mg  50 mg Intramuscular Q6H PRN Verne Spurr, PA-C      . diphenhydrAMINE (BENADRYL) injection 50 mg  50 mg Intramuscular Once Mojeed Akintayo      . famotidine (PEPCID) tablet 20 mg  20 mg Oral Q12H PRN Kerry Hough, PA   20 mg at 03/09/12 2312  . haloperidol (HALDOL) tablet 15 mg  15 mg Oral BID Verne Spurr, PA-C   15 mg at 03/12/12 1620   Or  . haloperidol lactate (HALDOL) injection 15 mg  15 mg Intramuscular BID Verne Spurr, PA-C      . haloperidol (HALDOL) tablet 5 mg  5 mg Oral Q6H PRN Mojeed Akintayo      . hydrALAZINE (APRESOLINE) tablet 10 mg  10 mg Oral Q6H PRN Shuvon  Rankin, NP   10 mg at 03/09/12 1038  . LORazepam (ATIVAN) tablet 2 mg  2 mg Oral Q6H PRN Mojeed Akintayo   2 mg at 03/12/12 0051  . magnesium hydroxide (MILK OF MAGNESIA) suspension 30 mL  30 mL Oral Daily PRN Shuvon Rankin, NP      . menthol-cetylpyridinium (CEPACOL) lozenge 3 mg  1 lozenge Oral PRN Shuvon Rankin, NP      . Oxcarbazepine (TRILEPTAL) tablet 300 mg  300 mg Oral BID Mojeed Akintayo   300 mg at 03/12/12 1611    Lab Results: No results found for this or any previous visit (from the past 48 hour(s)).  Physical Findings: AIMS: Facial and Oral Movements Muscles of Facial Expression: None, normal Lips and Perioral Area: None, normal Jaw: None, normal Tongue: None, normal,Extremity Movements Upper (arms, wrists, hands, fingers): None, normal Lower (legs, knees, ankles, toes): None, normal, Trunk Movements Neck, shoulders, hips: None, normal, Overall Severity Severity of abnormal movements (highest score from questions above): None, normal Incapacitation due to abnormal movements: None, normal Patient's awareness of abnormal movements (rate only patient's report): No Awareness, Dental Status Current problems with teeth and/or dentures?: Yes Does patient usually wear dentures?: No  CIWA:    COWS:     Treatment Plan  Daily contact the patient to assess and evaluate symptoms and progress in the treatment. Continue current medication . Encourage pressure to participate in group therapy. Encourage him to involved in his ADLs. Continue to follow up on plan with case manager to get in touch with the family about his baseline. Monitor the progress.  Problem Points:  Established problem, stable/improving (1), New problem, with additional work-up planned (4), Review of last therapy session (1) and Review of psycho-social stressors (1) Data Points:  Order Aims Assessment (2) Review of medication regiment & side effects (2) Review of new medications or change in dosage (2)  I  certify that inpatient services furnished can reasonably be expected to improve the patient's condition.   Kathryne Sharper, MD 9:59 AM 03/20/2012

## 2012-03-20 NOTE — Progress Notes (Addendum)
Pt is loud, demanding and irritable. He demanded that writer pick up each pill and place in his fingers before he would take his medication. He was mumbling while take his medication as responding to internal stimuli. Gave medications as ordered. He refused vitals this morning and as per MD ok to give all of his scheduled medications. Safety maintained on the unit.

## 2012-03-21 MED ORDER — OXCARBAZEPINE 300 MG PO TABS
600.0000 mg | ORAL_TABLET | Freq: Two times a day (BID) | ORAL | Status: DC
  Administered 2012-03-21 – 2012-03-29 (×15): 600 mg via ORAL
  Filled 2012-03-21 (×18): qty 2

## 2012-03-21 NOTE — Clinical Social Work Note (Signed)
Spoke to pt's sister who will come on Friday.

## 2012-03-21 NOTE — Progress Notes (Signed)
Psychoeducational Group Note  Date:  03/21/2012 Time:  2000  Group Topic/Focus:  Wrap-Up Group:   The focus of this group is to help patients review their daily goal of treatment and discuss progress on daily workbooks.  Participation Level: Did Not Attend  Participation Quality:  Not Applicable  Affect:  Not Applicable  Cognitive:  Not Applicable  Insight:  Not Applicable  Engagement in Group: Not Applicable  Additional Comments:  Pt was invited to attend group, but refused.  Christ Kick 03/21/2012, 10:28 PM

## 2012-03-21 NOTE — Progress Notes (Signed)
Patient ID: David Lang, male   DOB: 11-18-1950, 62 y.o.   MRN: [id number] [facility] MD Progress Note  Edy Belt  MRN:  [id number]  Subjective: "What are you wearing the white coat for?" I'm not taking or giving any blood, EKG is for the brain or the heart?" Patient continues to rant loudly and refuse labs, medications and is at times difficult to redirect. He continues to be loud. Objective. Patient seen and chart reviewed. He refuses labs, medications, CBGs to evaluate his blood sugars, blood draws to evaluate his electrolyte status, and an EKG. Currently we are at a point that guidance is needed from Unit Administration to provide direction to continue caring for this patient. The Director is in to see the patient with me to get a better understanding of the situation.   Diagnosis:  Schizophrenia paranoid type                      Hypertension                      DM type 2  ADL's:  Impaired  Sleep: poor 3.25 hours  Appetite:  Fair  Suicidal Ideation:  Unable to assess; Homicidal Ideation:  Unable to assess; AEB (as evidenced by):   Psychiatric Specialty Exam: Review of Systems  Unable to perform ROS Constitutional: Negative.   HENT: Negative.   Eyes: Negative.   Respiratory: Negative.   Cardiovascular: Negative.   Gastrointestinal: Negative.   Genitourinary: Negative.   Musculoskeletal: Negative.   Skin: Negative.   Neurological: Negative.   Endo/Heme/Allergies: Negative.   Psychiatric/Behavioral: Positive for hallucinations.  Grandiose, paranoid and easily irritable.   Blood pressure 153/78, pulse 68, temperature 98.5 F (36.9 C), temperature source Oral, resp. rate 16, height 5' 9.5" (1.765 m), weight 80.74 kg (178 lb), SpO2 98.00%.Body mass index is 25.92 kg/(m^2).  General Appearance: Disheveled  Eye Contact::  Poor  Speech:  Pressured loud and incoherent.      Volume:  Increased  Mood:  Angry and dismissive  Affect:  Labile and Full Range  Thought Process:   Disorganized  Orientation:  Other:  unable to assess.  Thought Content:  Delusions and Hallucinations: talking to self  Suicidal Thoughts:  No  Homicidal Thoughts:  No  Memory:  unable to assess.  Judgement:  Poor  Insight:  Lacking  Psychomotor Activity:  decreased  Concentration:  Poor  Recall:  Poor  Akathisia:  No  Handed:  Right  AIMS (if indicated):     Assets: minimal Sister is in [place] and 1 in [place]  Sleep:  Number of Hours: 6.75   Current Medications: Current Facility-Administered Medications  Medication Dose Route Frequency Provider Last Rate Last Dose  . acetaminophen (TYLENOL) tablet 650 mg  650 mg Oral Q6H PRN Shuvon Rankin, NP      . alum & mag hydroxide-simeth (MAALOX/MYLANTA) 200-200-20 MG/5ML suspension 30 mL  30 mL Oral Q4H PRN Shuvon Rankin, NP      . amLODipine (NORVASC) tablet 10 mg  10 mg Oral Daily Shuvon Rankin, NP   10 mg at 03/12/12 0818  . aspirin EC tablet 325 mg  325 mg Oral Daily Sanjuana Kava, NP   325 mg at 03/12/12 0810  . atenolol (TENORMIN) tablet 100 mg  100 mg Oral Daily Shuvon Rankin, NP   100 mg at 03/12/12 0826  . benztropine (COGENTIN) tablet 1 mg  1 mg Oral BID Verne Spurr, PA-C  1 mg at 03/12/12 1620  . calcium carbonate (TUMS - dosed in mg elemental calcium) chewable tablet 200 mg of elemental calcium  1 tablet Oral Q4H PRN Kerry Hough, PA      . diphenhydrAMINE (BENADRYL) capsule 50 mg  50 mg Oral Q6H PRN Verne Spurr, PA-C       Or  . diphenhydrAMINE (BENADRYL) injection 50 mg  50 mg Intramuscular Q6H PRN Verne Spurr, PA-C      . diphenhydrAMINE (BENADRYL) injection 50 mg  50 mg Intramuscular Once Mojeed Akintayo      . famotidine (PEPCID) tablet 20 mg  20 mg Oral Q12H PRN Kerry Hough, PA   20 mg at 03/09/12 2312  . haloperidol (HALDOL) tablet 15 mg  15 mg Oral BID Verne Spurr, PA-C   15 mg at 03/12/12 1620   Or  . haloperidol lactate (HALDOL) injection 15 mg  15 mg Intramuscular BID Verne Spurr, PA-C      .  haloperidol (HALDOL) tablet 5 mg  5 mg Oral Q6H PRN Mojeed Akintayo      . hydrALAZINE (APRESOLINE) tablet 10 mg  10 mg Oral Q6H PRN Shuvon Rankin, NP   10 mg at 03/09/12 1038  . LORazepam (ATIVAN) tablet 2 mg  2 mg Oral Q6H PRN Mojeed Akintayo   2 mg at 03/12/12 0051  . magnesium hydroxide (MILK OF MAGNESIA) suspension 30 mL  30 mL Oral Daily PRN Shuvon Rankin, NP      . menthol-cetylpyridinium (CEPACOL) lozenge 3 mg  1 lozenge Oral PRN Shuvon Rankin, NP      . Oxcarbazepine (TRILEPTAL) tablet 300 mg  300 mg Oral BID Mojeed Akintayo   300 mg at 03/12/12 1611    Lab Results: No results found for this or any previous visit (from the past 48 hour(s)).  Physical Findings: AIMS: Facial and Oral Movements Muscles of Facial Expression: None, normal Lips and Perioral Area: None, normal Jaw: None, normal Tongue: None, normal,Extremity Movements Upper (arms, wrists, hands, fingers): None, normal Lower (legs, knees, ankles, toes): None, normal, Trunk Movements Neck, shoulders, hips: None, normal, Overall Severity Severity of abnormal movements (highest score from questions above): None, normal Incapacitation due to abnormal movements: None, normal Patient's awareness of abnormal movements (rate only patient's report): No Awareness, Dental Status Current problems with teeth and/or dentures?: Yes Does patient usually wear dentures?: No  CIWA:    COWS:     Treatment Plan  Daily contact the patient to assess and evaluate symptoms and progress in the treatment. Continue current medication . Encourage pressure to participate in group therapy. Encourage him to involved in his ADLs. Continue to follow up on plan with case manager to get in touch with the family about his baseline. Monitor the progress. 1. Administration is suggesting that male MD and AD meet with the patient tomorrow at 11AM to have discussion regarding EKG and labs. 2. Will continue care as noted. 3. After discussion with MD will  increase the Trileptal to 600mg  po BID to help with mood stabilization. 4. Will arrange for unit director, MD, and labs and EKG to be done tomorrow at 11AM. Problem Points:  Established problem, stable/improving (1), New problem, with additional work-up planned (4), Review of last therapy session (1) and Review of psycho-social stressors (1) Data Points:  Order Aims Assessment (2) Review of medication regiment & side effects (2) Review of new medications or change in dosage (2)  I certify that inpatient services furnished can reasonably be  expected to improve the patient's condition.   Rona Ravens. Dakwon Wenberg RPAC 10:52 AM 03/21/2012

## 2012-03-21 NOTE — Clinical Social Work Note (Signed)
  Type of Therapy: Process Group Therapy  Participation Level:  Did Not Attend    Modes of Intervention:  Activity, Clarification, Education, Problem-solving and Support  Summary of Progress/Problems: Today's group addressed the issue of overcoming obstacles.  Patients were asked to identify their biggest obstacle post d/c that stands in the way of their on-going success, and then problem solve as to how to manage this.       David Lang 03/21/2012   3:12 PM

## 2012-03-21 NOTE — Progress Notes (Signed)
Patient ID: David Lang, male   DOB: [birth year], 62 y.o.   MRN: [id number] D. The patient was able to verbally interact in a more pleasant way during the evening. He was not talking to himself or yelling though he is still experiencing visual hallucinations. He got up early in the morning and c/o being hungry. He did not like the food choices that were offered and began yelling demanding a sandwich. He stopped yelling to point out a chucks pad he had thrown on the floor.  Stated that he threw it there to trap the lizard that was running around his room. When the pad was lifted and he was shown that there was nothing under it, he point and said he saw it run under the bed. He claimed the tail was 6 inches long. A. Met with patient and encouraged him to join milieu in the dayroom and to attend evening group. Several attempts to reorient and redirect when his voice was too loud or responding to hallucinations. 15 minute safety checks maintained. R. The patient came out of his room only briefly. He did not attend group. Responds only briefly regarding voice volume and quickly escalates back to loud, pressured speech. Will continue to monitor.

## 2012-03-21 NOTE — Progress Notes (Signed)
Pt continues to yell out at staff. Pt has loose association when communicating. Flight of ideas. Pt is very demanding. Pt was out in the hallway w/o his shirt on yelling that he need a shirt to wear. Pt was redirected by MHT. Pt cooperative when redirected. Pt reported that his sister cannot bring him clothing because she is the enemy. Pt is disorganized and presents with mild confusion.   Medications administered as ordered per MD. Verbal support given. Pt continues to need redirecting by staff. 15 minute checks performed for safety.  Pt is irritable most of the day. Pt remains safe.

## 2012-03-21 NOTE — Progress Notes (Signed)
Recreation Therapy Notes  03/21/2012         Time: 9:30am      Group Topic/Focus: Decision Making  Participation Level: Did not attend  Jearl Klinefelter, LRT/CTRS   Jearl Klinefelter 03/21/2012 12:26 PM

## 2012-03-21 NOTE — Progress Notes (Signed)
[facility] LCSW Aftercare Discharge Planning Group Note  03/21/2012 11:55 AM  Participation Quality:  Did not attend    Ida Rogue 03/21/2012, 11:55 AM

## 2012-03-22 LAB — CBC WITH DIFFERENTIAL/PLATELET
Basophils Absolute: 0.1 10*3/uL (ref 0.0–0.1)
Eosinophils Absolute: 0.3 10*3/uL (ref 0.0–0.7)
Lymphs Abs: 2.7 10*3/uL (ref 0.7–4.0)
MCH: 29.1 pg (ref 26.0–34.0)
Neutrophils Relative %: 64 % (ref 43–77)
Platelets: 404 10*3/uL — ABNORMAL HIGH (ref 150–400)
RBC: 4.16 MIL/uL — ABNORMAL LOW (ref 4.22–5.81)
RDW: 12.3 % (ref 11.5–15.5)
WBC: 11.2 10*3/uL — ABNORMAL HIGH (ref 4.0–10.5)

## 2012-03-22 LAB — FOLATE: Folate: 6.9 ng/mL

## 2012-03-22 LAB — PROTIME-INR
INR: 1.07 (ref 0.00–1.49)
Prothrombin Time: 13.8 seconds (ref 11.6–15.2)

## 2012-03-22 LAB — COMPREHENSIVE METABOLIC PANEL
AST: 8 U/L (ref 0–37)
Alkaline Phosphatase: 62 U/L (ref 39–117)
BUN: 16 mg/dL (ref 6–23)
CO2: 28 mEq/L (ref 19–32)
Chloride: 97 mEq/L (ref 96–112)
Creatinine, Ser: 1.34 mg/dL (ref 0.50–1.35)
GFR calc non Af Amer: 55 mL/min — ABNORMAL LOW (ref >90)
Total Bilirubin: 0.3 mg/dL (ref 0.3–1.2)

## 2012-03-22 LAB — MAGNESIUM: Magnesium: 2.1 mg/dL (ref 1.5–2.5)

## 2012-03-22 LAB — LIPID PANEL
HDL: 28 mg/dL — ABNORMAL LOW (ref >39)
LDL Cholesterol: 117 mg/dL — ABNORMAL HIGH (ref 0–99)

## 2012-03-22 LAB — RPR: RPR Ser Ql: NONREACTIVE

## 2012-03-22 LAB — VITAMIN B12: Vitamin B-12: 273 pg/mL (ref 211–911)

## 2012-03-22 LAB — T3, FREE: T3, Free: 2.2 pg/mL — ABNORMAL LOW (ref 2.3–4.2)

## 2012-03-22 LAB — TSH: TSH: 0.983 u[IU]/mL (ref 0.350–4.500)

## 2012-03-22 LAB — T4, FREE: Free T4: 0.86 ng/dL (ref 0.80–1.80)

## 2012-03-22 MED ORDER — NONFORMULARY OR COMPOUNDED ITEM
1.0000 "application " | Freq: Once | Status: AC
  Administered 2012-03-24: 1 via TOPICAL
  Filled 2012-03-22: qty 1

## 2012-03-22 MED ORDER — TUBERCULIN PPD 5 UNIT/0.1ML ID SOLN
5.0000 [IU] | Freq: Once | INTRADERMAL | Status: AC
  Administered 2012-03-22: 5 [IU] via INTRADERMAL

## 2012-03-22 NOTE — Progress Notes (Signed)
Patient ID: David Lang, male   DOB: 03-Jul-1950, 62 y.o.   MRN: [id number] [facility] MD Progress Note  03/22/2012 10:46 AM David Lang  MRN:  [id number]  Subjective:Patient remains uncooperative with his medications and treatment team. He is defiant, oppositional, easily agitated, speaks very loud, paranoid and has refused self care. Patient has also refused blood work up to monitor his blood sugar and electrolytes. He has also refused EKG needed to  monitor his QT interval  to prevent toxicity from his current medications. All efforts made to convince him have  proved unsuccessful. Also, efforts made by [place] for his family members to visit him in the hospital has not been successful. Diagnosis:  Schizophrenia paranoid type                      Hypertension                      DM type 2  ADL's:  Impaired  Sleep: fair 6.75 hours  Appetite:  Fair  Suicidal Ideation:  Unable to assess;"I won't answer that" Homicidal Ideation:  Unable to assess;"I won't answer that" AEB (as evidenced by):   Psychiatric Specialty Exam: Review of Systems  Unable to perform ROS Constitutional: Negative.   HENT: Negative.   Eyes: Negative.   Respiratory: Negative.   Cardiovascular: Negative.   Gastrointestinal: Negative.   Genitourinary: Negative.   Musculoskeletal: Negative.   Skin: Negative.   Neurological: Negative.   Endo/Heme/Allergies: Negative.   Psychiatric/Behavioral: Positive for hallucinations.       Agitated    Blood pressure 157/93, pulse 71, temperature 97.7 F (36.5 C), temperature source Oral, resp. rate 16, height 5' 9.5" (1.765 m), weight 80.74 kg (178 lb), SpO2 98.00%.Body mass index is 25.92 kg/(m^2).  General Appearance: Disheveled  Eye Contact::  Poor  Speech:  Garbled and loud He does respond to each question, but states I don't know to most of them.  Volume:  Increased  Mood:  Angry and Irritable  Affect:  Labile and Full Range  Thought Process:  Disorganized  Orientation:   Other:  unable to assess.  Thought Content:  Delusions and Hallucinations: talking to self  Suicidal Thoughts:  No  Homicidal Thoughts:  No  Memory:  unable to assess.  Judgement:  Poor  Insight:  Lacking  Psychomotor Activity:  Increased  Concentration:  Poor  Recall:  Poor  Akathisia:  No  Handed:  Right  AIMS (if indicated):     Assets: minimal Sister is in [place]  Sleep:  Number of Hours: 6.75   Current Medications: Current Facility-Administered Medications  Medication Dose Route Frequency Provider Last Rate Last Dose  . acetaminophen (TYLENOL) tablet 650 mg  650 mg Oral Q6H PRN Shuvon Rankin, NP      . alum & mag hydroxide-simeth (MAALOX/MYLANTA) 200-200-20 MG/5ML suspension 30 mL  30 mL Oral Q4H PRN Shuvon Rankin, NP      . amLODipine (NORVASC) tablet 10 mg  10 mg Oral Daily Shuvon Rankin, NP   10 mg at 03/12/12 0818  . aspirin EC tablet 325 mg  325 mg Oral Daily Sanjuana Kava, NP   325 mg at 03/12/12 0810  . atenolol (TENORMIN) tablet 100 mg  100 mg Oral Daily Shuvon Rankin, NP   100 mg at 03/12/12 0826  . benztropine (COGENTIN) tablet 1 mg  1 mg Oral BID Verne Spurr, PA-C   1 mg at 03/12/12 1620  . calcium carbonate (  TUMS - dosed in mg elemental calcium) chewable tablet 200 mg of elemental calcium  1 tablet Oral Q4H PRN Kerry Hough, PA      . diphenhydrAMINE (BENADRYL) capsule 50 mg  50 mg Oral Q6H PRN Verne Spurr, PA-C       Or  . diphenhydrAMINE (BENADRYL) injection 50 mg  50 mg Intramuscular Q6H PRN Verne Spurr, PA-C      . diphenhydrAMINE (BENADRYL) injection 50 mg  50 mg Intramuscular Once Sanaia Jasso      . famotidine (PEPCID) tablet 20 mg  20 mg Oral Q12H PRN Kerry Hough, PA   20 mg at 03/09/12 2312  . haloperidol (HALDOL) tablet 15 mg  15 mg Oral BID Verne Spurr, PA-C   15 mg at 03/12/12 1620   Or  . haloperidol lactate (HALDOL) injection 15 mg  15 mg Intramuscular BID Verne Spurr, PA-C      . haloperidol (HALDOL) tablet 5 mg  5 mg Oral Q6H  PRN Trenda Corliss      . hydrALAZINE (APRESOLINE) tablet 10 mg  10 mg Oral Q6H PRN Shuvon Rankin, NP   10 mg at 03/09/12 1038  . LORazepam (ATIVAN) tablet 2 mg  2 mg Oral Q6H PRN Osias Resnick   2 mg at 03/12/12 0051  . magnesium hydroxide (MILK OF MAGNESIA) suspension 30 mL  30 mL Oral Daily PRN Shuvon Rankin, NP      . menthol-cetylpyridinium (CEPACOL) lozenge 3 mg  1 lozenge Oral PRN Shuvon Rankin, NP      . Oxcarbazepine (TRILEPTAL) tablet 300 mg  300 mg Oral BID Stacee Earp   300 mg at 03/12/12 1611    Lab Results: No results found for this or any previous visit (from the past 48 hour(s)).  Physical Findings: AIMS: Facial and Oral Movements Muscles of Facial Expression: None, normal Lips and Perioral Area: None, normal Jaw: None, normal Tongue: None, normal,Extremity Movements Upper (arms, wrists, hands, fingers): None, normal Lower (legs, knees, ankles, toes): None, normal, Trunk Movements Neck, shoulders, hips: None, normal, Overall Severity Severity of abnormal movements (highest score from questions above): None, normal Incapacitation due to abnormal movements: None, normal Patient's awareness of abnormal movements (rate only patient's report): No Awareness, Dental Status Current problems with teeth and/or dentures?: Yes Does patient usually wear dentures?: No  CIWA:    COWS:     Treatment Plan Summary: Daily contact with patient to assess and evaluate symptoms and progress in treatment Medication management  Plan: 1. Will continue patient on current medication regimen 2. Will ask male tech to assist patient with shower or bath today if possible. 3. Encourage patient to participates in unit activities and other milieu. 4. Continue the Haldol to 30mg  per day, ie 15mg  po BID for delusions 5.Continue Trileptal 600mg  po BID for mood stabilization 5. Will get daily weights to monitor for decreased oral intake. Medical Decision Making Problem Points:  Established  problem, stable/improving (1), New problem, with additional work-up planned (4), Review of last therapy session (1) and Review of psycho-social stressors (1) Data Points:  Order Aims Assessment (2) Review of medication regiment & side effects (2) Review of new medications or change in dosage (2)  I certify that inpatient services furnished can reasonably be expected to improve the patient's condition.  Avion Patella,MD 03/22/2012, 10:46 AM

## 2012-03-22 NOTE — Progress Notes (Signed)
Pt continues to present with loud, pressured speech. Pt is irritable most of the day. Pt continues to refuse to shower. Pt continues to demand staff of inappropriate request. Pt however, is compliant with taking his meds. After talking with the MD, PA, AC and two nurses on the unit, pt agreed to let staff perform PPD, EKG and blood draw. Pt was able to remain calm with redirection from staff.   Medication administered as ordered per MD. Verbal support given. Pt continues to be redirected from staff. 15 minute checks performed for safety.  Pt is very demanding and loud.

## 2012-03-22 NOTE — Progress Notes (Signed)
[facility] LCSW Aftercare Discharge Planning Group Note  03/22/2012 10:29 AM  Participation Quality:  Did not attend    Ida Rogue 03/22/2012, 10:29 AM

## 2012-03-22 NOTE — Progress Notes (Signed)
Psychoeducational Group Note  Date:  03/22/2012 Time:  0930  Group Topic/Focus:  Recovery Goals:   The focus of this group is to identify appropriate goals for recovery and establish a plan to achieve them.  Participation Level: Did Not Attend  Participation Quality:  Not Applicable  Affect:  Not Applicable  Cognitive:  Not Applicable  Insight:  Not Applicable  Engagement in Group: Not Applicable  Additional Comments:  Pt refused to attend group this morning.  Waylin Dorko E 03/22/2012, 3:08 PM

## 2012-03-22 NOTE — Progress Notes (Signed)
Pt is observed in his room walking around.  Most of his conversation is difficult to understand.  He is loud and irritable.  He denies thoughts to hurt himself.  So far tonight, he has remained in his room and has been cooperative.  He does not have any meds scheduled for tonight.  This Clinical research associate introduced self and encouraged pt to make his needs known to staff.  Safety maintained with q15 minute checks.

## 2012-03-22 NOTE — Progress Notes (Signed)
Psychoeducational Group Note  Date:  03/22/2012 Time:  2000  Group Topic/Focus:  Wrap-Up Group:   The focus of this group is to help patients review their daily goal of treatment and discuss progress on daily workbooks.  Participation Level: Did Not Attend  Participation Quality:  Not Applicable  Affect:  Not Applicable  Cognitive:  Not Applicable  Insight:  Not Applicable  Engagement in Group: Not Applicable  Additional Comments:  Pt did not attend.  Christ Kick 03/22/2012, 9:56 PM

## 2012-03-22 NOTE — Clinical Social Work Note (Signed)
[facility] LCSW Group Therapy  03/22/2012 , 12:28 PM   Type of Therapy:  Group Therapy  Participation Level:  Did not attend    Summary of Progress/Problems: Today's group focused on the term Diagnosis.  Participants were asked to define the term, and then pronounce whether it is a negative, positive or neutral term.  Daryel Gerald B 03/22/2012 , 12:28 PM

## 2012-03-23 LAB — GC/CHLAMYDIA PROBE AMP
CT Probe RNA: NEGATIVE
GC Probe RNA: NEGATIVE

## 2012-03-23 LAB — HEMOGLOBIN A1C
Hgb A1c MFr Bld: 8.8 % — ABNORMAL HIGH (ref <5.7)
Mean Plasma Glucose: 206 mg/dL — ABNORMAL HIGH (ref <117)

## 2012-03-23 LAB — URINALYSIS, ROUTINE W REFLEX MICROSCOPIC
Glucose, UA: 100 mg/dL — AB
Ketones, ur: NEGATIVE mg/dL
Leukocytes, UA: NEGATIVE
Nitrite: NEGATIVE
Protein, ur: 100 mg/dL — AB
Urobilinogen, UA: 0.2 mg/dL (ref 0.0–1.0)

## 2012-03-23 LAB — URINE MICROSCOPIC-ADD ON

## 2012-03-23 MED ORDER — BENZOCAINE 10 % MT GEL: Freq: Four times a day (QID) | OROMUCOSAL | Status: DC | PRN

## 2012-03-23 MED ORDER — METFORMIN HCL ER 500 MG PO TB24
500.0000 mg | ORAL_TABLET | Freq: Every day | ORAL | Status: DC
  Administered 2012-03-25 – 2012-03-28 (×4): 500 mg via ORAL
  Filled 2012-03-23 (×9): qty 1

## 2012-03-23 NOTE — Clinical Social Work Note (Signed)
[facility] Group Notes:  (Counselor/Nursing/MHT/Case Management/Adjunct)  12/11/2011 12:00 PM  Type of Therapy:  Group Therapy  Participation Level:  Did Not Attend  Smart, Heather N 12/11/2011, 12:00 PM  

## 2012-03-23 NOTE — Progress Notes (Signed)
Patient ID: David Lang, male   DOB: 1950-06-01, 62 y.o.   MRN: [id number]  D: Pt denies SI/HI/AVH. Pt is loud with pressured, demanding voice. Pt continues to be argumentative. Pt continues to be religiously pre-occupied.   A: Pt was offered support and encouragement. Pt was given scheduled medications.Q 15 minute checks were done for safety.   R Pt not receptive to treatment and safety maintained on unit.

## 2012-03-23 NOTE — Progress Notes (Signed)
Pt in hallway yelling at writer, demanding a cane. Pt was encouraged to use his walker. Pt refused to use walker.

## 2012-03-23 NOTE — Progress Notes (Signed)
Patient ID: David Lang, male   DOB: 10-Oct-1950, 62 y.o.   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 He is told that he needs metformin, but he continues to refuse finger stick blood draws. He does respond appropriately to some questions, but still gets irritated and angry easily.  Diagnosis:  Schizophrenia paranoid type                      Hypertension                      DM type 2  ADL's:  Impaired  Sleep: fair 6.75 hours  Appetite:  Fair  Suicidal Ideation:  Unable to assess; Homicidal Ideation:  Unable to assess;" AEB (as evidenced by):   Psychiatric Specialty Exam: Review of Systems  Unable to perform ROS Constitutional: Negative.  Negative for fever, chills, weight loss, malaise/fatigue and diaphoresis.  HENT: Negative.  Negative for congestion and sore throat.   Eyes: Negative.  Negative for blurred vision, double vision and photophobia.  Respiratory: Negative.  Negative for cough, shortness of breath and wheezing.   Cardiovascular: Negative.  Negative for chest pain, palpitations and PND.  Gastrointestinal: Negative.  Negative for heartburn, nausea, vomiting, abdominal pain, diarrhea and constipation.  Genitourinary: Negative.   Musculoskeletal: Negative.  Negative for myalgias, joint pain and falls.  Skin: Negative.   Neurological: Negative.  Negative for dizziness, tingling, tremors, sensory change, speech change, focal weakness, seizures, loss of consciousness, weakness and headaches.  Endo/Heme/Allergies: Negative.  Negative for polydipsia. Does not bruise/bleed easily.  Psychiatric/Behavioral: Positive for hallucinations. Negative for depression, suicidal ideas, memory loss and  substance abuse. The patient is not nervous/anxious and does not have insomnia.        Agitated    Blood pressure 157/94, pulse 71, temperature 98.9 F (37.2 C), temperature source Oral, resp. rate 16, height 5' 9.5" (1.765 m), weight 80.74 kg (178 lb), SpO2 98.00%.Body mass index is 25.92 kg/(m^2).  General Appearance: Disheveled  Eye Contact::  Poor  Speech: loud and goal directed  Volume:  Increased  Mood:  Angry and Irritable  Affect:  Labile and Full Range  Thought Process:  Disorganized but some some clearing is noted  Orientation:  Other:  unable to assess.  Thought Content:  Delusions and Hallucinations: talking to self  Suicidal Thoughts:  No  Homicidal Thoughts:  No  Memory:  unable to assess.  Judgement:  Poor  Insight:  Lacking but does ask appropriate questions regarding his medication.  Psychomotor Activity:  Increased  Concentration:  Poor  Recall:  Poor  Akathisia:  No  Handed:  Right  AIMS (if indicated):     Assets: minimal Sister is in [place], sister in [place]  Sleep:  Number of Hours: 4.25   Current Medications: Current Facility-Administered Medications  Medication Dose Route Frequency Provider Last Rate Last Dose  . acetaminophen (TYLENOL) tablet 650 mg  650 mg Oral Q6H PRN Shuvon Rankin, NP      . alum & mag hydroxide-simeth (MAALOX/MYLANTA) 200-200-20 MG/5ML suspension 30 mL  30 mL Oral Q4H PRN Shuvon Rankin, NP      . amLODipine (NORVASC) tablet 10 mg  10 mg Oral Daily Shuvon Rankin, NP   10 mg at 03/12/12 0818  . aspirin EC tablet 325 mg  325 mg Oral Daily Sanjuana Kava, NP   325 mg at 03/12/12 0810  . atenolol (TENORMIN) tablet 100 mg  100 mg Oral Daily Shuvon Rankin, NP   100 mg at 03/12/12 0826  . benztropine (COGENTIN) tablet 1 mg  1 mg Oral BID Verne Spurr, PA-C   1 mg at 03/12/12 1620  . calcium carbonate (TUMS - dosed in mg elemental calcium) chewable tablet 200 mg of elemental calcium  1 tablet Oral Q4H PRN Kerry Hough, PA      .  diphenhydrAMINE (BENADRYL) capsule 50 mg  50 mg Oral Q6H PRN Verne Spurr, PA-C       Or  . diphenhydrAMINE (BENADRYL) injection 50 mg  50 mg Intramuscular Q6H PRN Verne Spurr, PA-C      . diphenhydrAMINE (BENADRYL) injection 50 mg  50 mg Intramuscular Once Mojeed Akintayo      . famotidine (PEPCID) tablet 20 mg  20 mg Oral Q12H PRN Kerry Hough, PA   20 mg at 03/09/12 2312  . haloperidol (HALDOL) tablet 15 mg  15 mg Oral BID Verne Spurr, PA-C   15 mg at 03/12/12 1620   Or  . haloperidol lactate (HALDOL) injection 15 mg  15 mg Intramuscular BID Verne Spurr, PA-C      . haloperidol (HALDOL) tablet 5 mg  5 mg Oral Q6H PRN Mojeed Akintayo      . hydrALAZINE (APRESOLINE) tablet 10 mg  10 mg Oral Q6H PRN Shuvon Rankin, NP   10 mg at 03/09/12 1038  . LORazepam (ATIVAN) tablet 2 mg  2 mg Oral Q6H PRN Mojeed Akintayo   2 mg at 03/12/12 0051  . magnesium hydroxide (MILK OF MAGNESIA) suspension 30 mL  30 mL Oral Daily PRN Shuvon Rankin, NP      . menthol-cetylpyridinium (CEPACOL) lozenge 3 mg  1 lozenge Oral PRN Shuvon Rankin, NP      . Oxcarbazepine (TRILEPTAL) tablet 300 mg  300 mg Oral BID Mojeed Akintayo   300 mg at 03/12/12 1611    Lab Results:  Results for orders placed during the hospital encounter of 03/04/12 (from the past 48 hour(s))  GLUCOSE, CAPILLARY     Status: Abnormal   Collection Time    03/22/12 11:12 AM      Result Value Range   Glucose-Capillary 142 (*) 70 - 99 mg/dL  COMPREHENSIVE METABOLIC PANEL     Status: Abnormal   Collection Time    03/22/12 11:34 AM      Result Value Range   Sodium 132 (*) 135 - 145 mEq/L   Potassium 4.6  3.5 - 5.1 mEq/L   Chloride 97  96 - 112 mEq/L   CO2 28  19 - 32 mEq/L   Glucose, Bld 143 (*) 70 - 99 mg/dL   BUN 16  6 - 23 mg/dL   Creatinine, Ser 4.09  0.50 - 1.35 mg/dL   Calcium 8.7  8.4 - 81.1 mg/dL   Total Protein 6.2  6.0 - 8.3 g/dL   Albumin 3.1 (*) 3.5 - 5.2 g/dL   AST 8  0 - 37 U/L   ALT 6  0 - 53 U/L   Alkaline Phosphatase  62  39 - 117 U/L   Total Bilirubin 0.3  0.3 - 1.2 mg/dL   GFR calc non Af Amer 55 (*) >90 mL/min  GFR calc Af Amer 64 (*) >90 mL/min   Comment:            The eGFR has been calculated     using the CKD EPI equation.     This calculation has not been     validated in all clinical     situations.     eGFR's persistently     <90 mL/min signify     possible Chronic Kidney Disease.  CBC WITH DIFFERENTIAL     Status: Abnormal   Collection Time    03/22/12 11:34 AM      Result Value Range   WBC 11.2 (*) 4.0 - 10.5 K/uL   RBC 4.16 (*) 4.22 - 5.81 MIL/uL   Hemoglobin 12.1 (*) 13.0 - 17.0 g/dL   HCT 86.5 (*) 78.4 - 69.6 %   MCV 86.3  78.0 - 100.0 fL   MCH 29.1  26.0 - 34.0 pg   MCHC 33.7  30.0 - 36.0 g/dL   RDW 29.5  28.4 - 13.2 %   Platelets 404 (*) 150 - 400 K/uL   Neutrophils Relative 64  43 - 77 %   Neutro Abs 7.2  1.7 - 7.7 K/uL   Lymphocytes Relative 24  12 - 46 %   Lymphs Abs 2.7  0.7 - 4.0 K/uL   Monocytes Relative 9  3 - 12 %   Monocytes Absolute 1.0  0.1 - 1.0 K/uL   Eosinophils Relative 3  0 - 5 %   Eosinophils Absolute 0.3  0.0 - 0.7 K/uL   Basophils Relative 1  0 - 1 %   Basophils Absolute 0.1  0.0 - 0.1 K/uL  HEMOGLOBIN A1C     Status: Abnormal   Collection Time    03/22/12 11:34 AM      Result Value Range   Hemoglobin A1C 8.8 (*) <5.7 %   Comment: (NOTE)                                                                               According to the ADA Clinical Practice Recommendations for 2011, when     HbA1c is used as a screening test:      >=6.5%   Diagnostic of Diabetes Mellitus               (if abnormal result is confirmed)     5.7-6.4%   Increased risk of developing Diabetes Mellitus     References:Diagnosis and Classification of Diabetes Mellitus,Diabetes     Care,2011,34(Suppl 1):S62-S69 and Standards of Medical Care in             Diabetes - 2011,Diabetes Care,2011,34 (Suppl 1):S11-S61.   Mean Plasma Glucose 206 (*) <117 mg/dL  LIPID PANEL      Status: Abnormal   Collection Time    03/22/12 11:34 AM      Result Value Range   Cholesterol 199  0 - 200 mg/dL   Triglycerides 440 (*) <150 mg/dL   HDL 28 (*) >10 mg/dL   Total CHOL/HDL Ratio 7.1     VLDL 54 (*) 0 - 40 mg/dL   LDL Cholesterol 272 (*) 0 - 99 mg/dL   Comment:  Total Cholesterol/HDL:CHD Risk     Coronary Heart Disease Risk Table                         Men   Women      1/2 Average Risk   3.4   3.3      Average Risk       5.0   4.4      2 X Average Risk   9.6   7.1      3 X Average Risk  23.4   11.0                Use the calculated Patient Ratio     above and the CHD Risk Table     to determine the patient's CHD Risk.                ATP III CLASSIFICATION (LDL):      <100     mg/dL   Optimal      161-096  mg/dL   Near or Above                        Optimal      130-159  mg/dL   Borderline      045-409  mg/dL   High      >811     mg/dL   Very High  TSH     Status: None   Collection Time    03/22/12 11:34 AM      Result Value Range   TSH 0.983  0.350 - 4.500 uIU/mL  T3, FREE     Status: Abnormal   Collection Time    03/22/12 11:34 AM      Result Value Range   T3, Free 2.2 (*) 2.3 - 4.2 pg/mL  T4, FREE     Status: None   Collection Time    03/22/12 11:34 AM      Result Value Range   Free T4 0.86  0.80 - 1.80 ng/dL  RPR     Status: None   Collection Time    03/22/12 11:34 AM      Result Value Range   RPR NON REACTIVE  NON REACTIVE  HIV ANTIBODY (ROUTINE TESTING)     Status: None   Collection Time    03/22/12 11:34 AM      Result Value Range   HIV NON REACTIVE  NON REACTIVE  FOLATE     Status: None   Collection Time    03/22/12 11:34 AM      Result Value Range   Folate 6.9     Comment: (NOTE)     Reference Ranges            Deficient:       0.4 - 3.3 ng/mL            Indeterminate:   3.4 - 5.4 ng/mL            Normal:              > 5.4 ng/mL  VITAMIN B12     Status: None   Collection Time    03/22/12 11:34 AM      Result Value  Range   Vitamin B-12 273  211 - 911 pg/mL  MAGNESIUM     Status: None   Collection Time    03/22/12 11:34 AM      Result Value  Range   Magnesium 2.1  1.5 - 2.5 mg/dL  PHOSPHORUS     Status: None   Collection Time    03/22/12 11:34 AM      Result Value Range   Phosphorus 2.8  2.3 - 4.6 mg/dL  PROTIME-INR     Status: None   Collection Time    03/22/12 11:34 AM      Result Value Range   Prothrombin Time 13.8  11.6 - 15.2 seconds   INR 1.07  0.00 - 1.49  URINALYSIS, ROUTINE W REFLEX MICROSCOPIC     Status: Abnormal   Collection Time    03/22/12  6:54 PM      Result Value Range   Color, Urine YELLOW  YELLOW   APPearance CLEAR  CLEAR   Specific Gravity, Urine 1.016  1.005 - 1.030   pH 6.0  5.0 - 8.0   Glucose, UA 100 (*) NEGATIVE mg/dL   Hgb urine dipstick NEGATIVE  NEGATIVE   Bilirubin Urine NEGATIVE  NEGATIVE   Ketones, ur NEGATIVE  NEGATIVE mg/dL   Protein, ur 191 (*) NEGATIVE mg/dL   Urobilinogen, UA 0.2  0.0 - 1.0 mg/dL   Nitrite NEGATIVE  NEGATIVE   Leukocytes, UA NEGATIVE  NEGATIVE  URINE MICROSCOPIC-ADD ON     Status: None   Collection Time    03/22/12  6:54 PM      Result Value Range   Squamous Epithelial / LPF RARE  RARE   WBC, UA 0-2  <3 WBC/hpf   RBC / HPF 0-2  <3 RBC/hpf   Bacteria, UA RARE  RARE    Physical Findings: AIMS: Facial and Oral Movements Muscles of Facial Expression: None, normal Lips and Perioral Area: None, normal Jaw: None, normal Tongue: None, normal,Extremity Movements Upper (arms, wrists, hands, fingers): None, normal Lower (legs, knees, ankles, toes): None, normal, Trunk Movements Neck, shoulders, hips: None, normal, Overall Severity Severity of abnormal movements (highest score from questions above): None, normal Incapacitation due to abnormal movements: None, normal Patient's awareness of abnormal movements (rate only patient's report): No Awareness, Dental Status Current problems with teeth and/or dentures?: Yes Does patient  usually wear dentures?: No  CIWA:    COWS:     Treatment Plan Summary: Daily contact with patient to assess and evaluate symptoms and progress in treatment Medication management  Plan: 1. Patient is seen and the chart is reviewed, labs are reviewed. 2. Add metformin 500 with breakfast. Will add CBGs as patient will allow. 3. Encourage patient to participates in unit activities and other milieu. 4. Continue the Haldol to 30mg  per day, ie 15mg  po BID for delusions 5.Continue Trileptal 600mg  po BID for mood stabilization with goal to maximize dosage. 6. Will get daily weights to monitor for decreased oral intake. 7. Will hold off on treating lipids at this point, have IM make that determination. 8. Ekg is reviewed and as the QT is at 446 will not add another antipsychotic at this time. Medical Decision Making Problem Points:  Established problem, stable/improving (1), New problem, with additional work-up planned (4), Review of last therapy session (1) and Review of psycho-social stressors (1) Data Points:  Order Aims Assessment (2) Review of medication regiment & side effects (2) Review of new medications or change in dosage (2)  I certify that inpatient services furnished can reasonably be expected to improve the patient's condition.   Rona Ravens. Maybelline Kolarik RPAC 03/23/2012, 11:27 AM

## 2012-03-23 NOTE — Clinical Social Work Note (Signed)
I was given the names a numbers of 3 locked ALF facilities in the region.  Only one has openings.  I sent them required paperwork yesterday, and am waiting to hear back.  [facility] is in [place].

## 2012-03-23 NOTE — Progress Notes (Signed)
Pt was given new paper scrub pants to wear.

## 2012-03-23 NOTE — Progress Notes (Signed)
D: Patient in bed on first approach.  Patient finally did arouse around 2200 to peak with writer briefly.  Patient still disorganized but not as demanding.  Patient denies SI/HI but still having visual hallucinations.  Patient refused to have his blood sugar taken tonight. A: Staff to monitor Q 15 mins for safety.  Encouragement and support offered.  No medications administered tonight. R: Patient remains safe on the unit.  Patient remains isolative in his room.  Patient did not attend group tonight.

## 2012-03-23 NOTE — Progress Notes (Signed)
Pt is currently making progress today. Pt cooperative medication administration. Pt is oriented to time (year) but disoriented to situation. Pt asked Clinical research associate, "why am I here". Pt reported that he do not have schizophrenia. Pt is less demanding today and asked appropriate questions this morning. Pt continues to refuse to shower. Pt has poor hygiene and body odor. Pt continues to need redirecting by staff with ADLs.   Medications administered as ordered per MD. Verbal support given. Pt reoriented to situation and place. Pt encouraged to shower. Pt redirected by staff. 15 minute checks performed for safety.  Pt mildly confused. Pt cooperative with meds. Pt continues to isolate in room.

## 2012-03-23 NOTE — Progress Notes (Signed)
Recreation Therapy Notes   Date: 02.26.2014 Time: 9:30am Location: [address] Day Room      Group Topic/Focus: Decision Making  Participation Level: Did not attend  Jearl Klinefelter, LRT/CTRS   Jearl Klinefelter 03/23/2012 11:51 AM

## 2012-03-23 NOTE — Progress Notes (Signed)
Psychoeducational Group Note  Date:  03/23/2012 Time:  2000  Group Topic/Focus:  Wrap-Up Group:   The focus of this group is to help patients review their daily goal of treatment and discuss progress on daily workbooks.  Participation Level: Did Not Attend  Participation Quality:  Not Applicable  Affect:  Not Applicable  Cognitive:  Not Applicable  Insight:  Not Applicable  Engagement in Group: Not Applicable  Additional Comments:  Pt did not attend.  Christ Kick 03/23/2012, 8:55 PM

## 2012-03-23 NOTE — Progress Notes (Signed)
[facility] LCSW Aftercare Discharge Planning Group Note  03/23/2012 11:13 AM  Participation Quality:  Did not attend    Ida Rogue 03/23/2012, 11:13 AM

## 2012-03-23 NOTE — Treatment Plan (Signed)
  Interdisciplinary Treatment Plan Update   Date Reviewed:  03/23/2012  Time Reviewed:  1:33 PM  Progress in Treatment:   Attending groups: No Participating in groups: No Taking medication as prescribed: Yes  Tolerating medication: Yes Family/Significant other contact made: Yes  Patient understands diagnosis: Yes  Discussing patient identified problems/goals with staff: Yes Medical problems stabilized or resolved: Yes Denies suicidal/homicidal ideation: Yes Patient has not harmed self or others: Yes  For review of initial/current patient goals, please see plan of care.  Estimated Length of Stay:  4-5 days  Reason for Continuation of Hospitalization: Medication stabilization Other; describe Disorganized thinking  New Problems/Goals identified:  N/A  Discharge Plan or Barriers:   Sister is coming on Friday to talk to pt about dispositional plan of going to ALF  Additional Comments: David Lang was forcibly given EKG and blood draw yesterday which was immensely helpful in terms of charting a treatment course.  As a result, he was started on Metformin today and his Haldol is being held at current level.  Attendees:  Signature: Thedore Mins, MD 03/23/2012 1:33 PM   Signature: Richelle Ito, LCSW 03/23/2012 1:33 PM  Signature: Verne Spurr, PA 03/23/2012 1:33 PM  Signature:  03/23/2012 1:33 PM  Signature: Liborio Nixon, RN 03/23/2012 1:33 PM  Signature:  03/23/2012 1:33 PM  Signature:   03/23/2012 1:33 PM  Signature:    Signature:    Signature:    Signature:    Signature:    Signature:      Scribe for Treatment Team:   Richelle Ito, LCSW  03/23/2012 1:33 PM

## 2012-03-24 MED ORDER — LOPERAMIDE HCL 2 MG PO CAPS: 2.0000 mg | ORAL_CAPSULE | ORAL | Status: DC | PRN

## 2012-03-24 NOTE — Progress Notes (Signed)
Patient ID: David Lang, male   DOB: Oct 01, 1950, 62 y.o.   MRN: [id number]  D: Pt denies SI/HI/AVH. Pt is behaving better today, by cooperating with staff. Pt continues to have loud pressured speech. Pt continues to be religiously pre-occupied and continues to have disorganized thoughts. Pt continues to be demanding and tries to be bossy.    A: Pt was offered support and encouragement. Pt was given scheduled medications. Pt was encourage to attend groups. Q 15 minute checks were done for safety.    R:  safety maintained on unit.

## 2012-03-24 NOTE — Progress Notes (Signed)
Patient ID: David Lang, male   DOB: [birth year], 62 y.o.   MRN: [id number] Patient fell in the bathroom; unwitnessed.  He reported no pain or injury; he stated that he did not hit his head.  Vital takes were 113/68 pulse 54 respirations 20.  Patient had been incontinent and was attempting to reach the toilet.  He fell by the sink onto his buttocks.  Safety zone and fall huddle was completed.  Vitals complete every four hours; family notified in [place].  Dr. Karena Addison [phone].

## 2012-03-24 NOTE — Progress Notes (Signed)
Psychoeducational Group Note  Date:  03/24/2012 Time:  0930  Group Topic/Focus:  Rediscovering Joy:   The focus of this group is to explore various ways to relieve stress in a positive manner.  Participation Level: Did Not Attend  Participation Quality:  Not Applicable  Affect:  Not Applicable  Cognitive:  Not Applicable  Insight:  Not Applicable  Engagement in Group: Not Applicable  Additional Comments:  Pt refused to attend group this morning.  Dani Wallner E 03/24/2012, 3:03 PM

## 2012-03-24 NOTE — Progress Notes (Signed)
[facility] LCSW Aftercare Discharge Planning Group Note  03/24/2012 9:35 AM  Participation Quality:  Did not attend    Ida Rogue 03/24/2012, 9:35 AM

## 2012-03-24 NOTE — Clinical Social Work Note (Signed)
[facility] Group Notes:  (Counselor/Nursing/MHT/Case Management/Adjunct)  03/24/2012 2:35 PM   Type of Therapy:  Group Therapy  Participation Level:  Did Not Attend   Smart, Ledell Peoples 12/10/2011, 2:34 PM

## 2012-03-24 NOTE — Progress Notes (Signed)
Patient ID: David Lang, male   DOB: 18-Sep-1950, 62 y.o.   MRN: [id number] Patient in room; hygiene needs attending to.  Patient put in shower by staff; he had been incontinent and could not clean himself.  Patient was assisted with a shower by MHT and nurse.  Patient asked why he was in the hospital.  He asked why his thinking was not clear at home.  Patient remains with loud voice; carries on a disorganized conversation.  Patient was talking about making a zucchini omelet and requesting macaroni salad.  Patient is compliant with his medications.    Continue to monitor medication management and MD orders.  Safety checks continued every 15 minutes per protocol.  Patient's behavior is redirectable.  He denies any SI/HI/AVH.

## 2012-03-24 NOTE — Progress Notes (Signed)
[facility] LCSW Aftercare Discharge Planning Group Note  03/24/2012 9:48 AM  Participation Quality: Did not Attend   Smart, Ledell Peoples 03/24/2012, 9:48 AM

## 2012-03-25 LAB — GLUCOSE, CAPILLARY: Glucose-Capillary: 145 mg/dL — ABNORMAL HIGH (ref 70–99)

## 2012-03-25 NOTE — Clinical Social Work Note (Signed)
[facility] Group Notes:  (Counselor/Nursing/MHT/Case Management/Adjunct)  12/11/2011 12:00 PM  Type of Therapy:  Group Therapy  Participation Level:  Did Not Attend  Smart, Heather N 12/11/2011, 12:00 PM  

## 2012-03-25 NOTE — Progress Notes (Signed)
Patient ID: David Lang, male   DOB: [birth year], 62 y.o.   MRN: [id number]   Pt was observed sitting on his buttocks when MHT doing 15 min checks. Pt states he was looking at the air conditioner, trying to read the numbers while on his knees, he lost his balance and ended up on his buttocks. Pt was assessed and found to be in no distress, Bp-141/79, P-70, RR-18. Pt was informed of his instability and told to use the extended call light that was put in his room after the incident.

## 2012-03-25 NOTE — Progress Notes (Signed)
Patient ID: David Lang, male   DOB: 13-Oct-1950, 62 y.o.   MRN: [id number] [facility] MD Progress Note  03/25/2012 9:31 AM David Lang  MRN:  [id number]  Subjective:Patient appear less labile, angry or agitated today. However, he continues to speak very loud, paranoid and he is partially cooperative with his medications and other  treatment modalities. He finally agreed to take a shower yesterday. His family members have been contacted but they are yet to pay him a visit since he was admitted to the hospital. Diagnosis:  Schizophrenia paranoid type                      Hypertension                      DM type 2  ADL's:  Impaired  Sleep: fair   Appetite:  Fair  Suicidal Ideation:  Unable to assess;"I won't answer that" Homicidal Ideation:  Unable to assess;"I won't answer that" AEB (as evidenced by):   Psychiatric Specialty Exam: Review of Systems  Unable to perform ROS Constitutional: Negative.   HENT: Negative.   Eyes: Negative.   Respiratory: Negative.   Cardiovascular: Negative.   Gastrointestinal: Negative.   Genitourinary: Negative.   Musculoskeletal: Negative.   Skin: Negative.   Neurological: Negative.   Endo/Heme/Allergies: Negative.   Psychiatric/Behavioral: Positive for hallucinations.       Agitated    Blood pressure 155/79, pulse 73, temperature 98.3 F (36.8 C), temperature source Oral, resp. rate 18, height 5' 9.5" (1.765 m), weight 80.74 kg (178 lb), SpO2 98.00%.Body mass index is 25.92 kg/(m^2).  General Appearance: fairly groomed  Patent attorney::  Poor  Speech:  Garbled and loud  Volume:  Increased  Mood:  less Irritable  Affect:  Full range  Thought Process:  Disorganized  Orientation:  Other:  unable to assess.  Thought Content:  Delusions and Hallucinations: talking to self  Suicidal Thoughts:  No  Homicidal Thoughts:  No  Memory:  unable to assess.  Judgement:  Poor  Insight:  Lacking  Psychomotor Activity:  Increased  Concentration:  Poor  Recall:   Poor  Akathisia:  No  Handed:  Right  AIMS (if indicated):     Assets: minimal Sister is in [place]  Sleep:  Number of Hours: 4.5   Current Medications: Current Facility-Administered Medications  Medication Dose Route Frequency Provider Last Rate Last Dose  . acetaminophen (TYLENOL) tablet 650 mg  650 mg Oral Q6H PRN Shuvon Rankin, NP      . alum & mag hydroxide-simeth (MAALOX/MYLANTA) 200-200-20 MG/5ML suspension 30 mL  30 mL Oral Q4H PRN Shuvon Rankin, NP      . amLODipine (NORVASC) tablet 10 mg  10 mg Oral Daily Shuvon Rankin, NP   10 mg at 03/12/12 0818  . aspirin EC tablet 325 mg  325 mg Oral Daily Sanjuana Kava, NP   325 mg at 03/12/12 0810  . atenolol (TENORMIN) tablet 100 mg  100 mg Oral Daily Shuvon Rankin, NP   100 mg at 03/12/12 0826  . benztropine (COGENTIN) tablet 1 mg  1 mg Oral BID Verne Spurr, PA-C   1 mg at 03/12/12 1620  . calcium carbonate (TUMS - dosed in mg elemental calcium) chewable tablet 200 mg of elemental calcium  1 tablet Oral Q4H PRN Kerry Hough, PA      . diphenhydrAMINE (BENADRYL) capsule 50 mg  50 mg Oral Q6H PRN Verne Spurr, PA-C  Or  . diphenhydrAMINE (BENADRYL) injection 50 mg  50 mg Intramuscular Q6H PRN Verne Spurr, PA-C      . diphenhydrAMINE (BENADRYL) injection 50 mg  50 mg Intramuscular Once Jayleigh Notarianni      . famotidine (PEPCID) tablet 20 mg  20 mg Oral Q12H PRN Kerry Hough, PA   20 mg at 03/09/12 2312  . haloperidol (HALDOL) tablet 15 mg  15 mg Oral BID Verne Spurr, PA-C   15 mg at 03/12/12 1620   Or  . haloperidol lactate (HALDOL) injection 15 mg  15 mg Intramuscular BID Verne Spurr, PA-C      . haloperidol (HALDOL) tablet 5 mg  5 mg Oral Q6H PRN Geral Coker      . hydrALAZINE (APRESOLINE) tablet 10 mg  10 mg Oral Q6H PRN Shuvon Rankin, NP   10 mg at 03/09/12 1038  . LORazepam (ATIVAN) tablet 2 mg  2 mg Oral Q6H PRN Nicki Furlan   2 mg at 03/12/12 0051  . magnesium hydroxide (MILK OF MAGNESIA) suspension 30  mL  30 mL Oral Daily PRN Shuvon Rankin, NP      . menthol-cetylpyridinium (CEPACOL) lozenge 3 mg  1 lozenge Oral PRN Shuvon Rankin, NP      . Oxcarbazepine (TRILEPTAL) tablet 300 mg  300 mg Oral BID Alyzae Hawkey   300 mg at 03/12/12 1611    Lab Results:  Results for orders placed during the hospital encounter of 03/04/12 (from the past 48 hour(s))  GLUCOSE, CAPILLARY     Status: Abnormal   Collection Time    03/23/12 11:41 AM      Result Value Range   Glucose-Capillary 116 (*) 70 - 99 mg/dL    Physical Findings: AIMS: Facial and Oral Movements Muscles of Facial Expression: None, normal Lips and Perioral Area: None, normal Jaw: None, normal Tongue: None, normal,Extremity Movements Upper (arms, wrists, hands, fingers): None, normal Lower (legs, knees, ankles, toes): None, normal, Trunk Movements Neck, shoulders, hips: None, normal, Overall Severity Severity of abnormal movements (highest score from questions above): None, normal Incapacitation due to abnormal movements: None, normal Patient's awareness of abnormal movements (rate only patient's report): No Awareness, Dental Status Current problems with teeth and/or dentures?: Yes Does patient usually wear dentures?: No  CIWA:    COWS:     Treatment Plan Summary: Daily contact with patient to assess and evaluate symptoms and progress in treatment Medication management  Plan: 1. Will continue patient on current medication regimen 2. Will ask male tech to assist patient with shower or bath today if possible. 3. Encourage patient to participates in unit activities and other milieu. 4. Continue the Haldol to 30mg  per day, ie 15mg  po BID for delusions 5.Continue Trileptal 900mg  po BID for mood stabilization 5. Will get daily weights to monitor for decreased oral intake. Medical Decision Making Problem Points:  Established problem, stable/improving (1), New problem, with additional work-up planned (4), Review of last therapy  session (1) and Review of psycho-social stressors (1) Data Points:  Order Aims Assessment (2) Review of medication regiment & side effects (2) Review of new medications or change in dosage (2)  I certify that inpatient services furnished can reasonably be expected to improve the patient's condition.  Milinda Sweeney,MD 03/25/2012, 9:31 AM

## 2012-03-25 NOTE — Progress Notes (Signed)
Inpatient Diabetes Program Recommendations  AACE/ADA: New Consensus Statement on Inpatient Glycemic Control (2013)  Target Ranges:  Prepandial:   less than 140 mg/dL      Peak postprandial:   less than 180 mg/dL (1-2 hours)      Critically ill patients:  140 - 180 mg/dL   Reason for Visit: Elevated HgbA1C  Results for David Lang, David Lang (MRN [id number]) as of 03/25/2012 10:06  Ref. Range 03/22/2012 11:12 03/23/2012 11:41  Glucose-Capillary Latest Range: 70-99 mg/dL 409 (H) 811 (H)  Results for David Lang, David Lang (MRN [id number]) as of 03/25/2012 10:06  Ref. Range 03/22/2012 11:34  Sodium Latest Range: 135-145 mEq/L 132 (L)  Potassium Latest Range: 3.5-5.1 mEq/L 4.6  Chloride Latest Range: 96-112 mEq/L 97  CO2 Latest Range: 19-32 mEq/L 28  Mean Plasma Glucose Latest Range: <117 mg/dL 213 (H)  BUN Latest Range: 6-23 mg/dL 16  Creatinine Latest Range: 0.50-1.35 mg/dL 0.86  Calcium Latest Range: 8.4-10.5 mg/dL 8.7  GFR calc non Af Amer Latest Range: >90 mL/min 55 (L)  GFR calc Af Amer Latest Range: >90 mL/min 64 (L)  Glucose Latest Range: 70-99 mg/dL 578 (H)    Inpatient Diabetes Program Recommendations Correction (SSI): Please consider addition of Novolog sensitive tidwc HgbA1C: 8.8% - uncontrolled DM Diet: CHO Mod Med - Encourage pt to make healthy choices in the cafeteria using portion control to assist with normalizing blood sugars  Note: Will continue to follow.  Thank you. Ailene Ards, RD, LDN, CDE Inpatient Diabetes Coordinator [phone]

## 2012-03-25 NOTE — Progress Notes (Signed)
Patient ID: David Lang, male   DOB: Nov 23, 1950, 62 y.o.   MRN: [id number] [facility] MD Progress Note  03/25/2012 12:12 AM Jayveon Convey  MRN:  [id number]  Subjective: "I want stuffed zucchini with wine leaves, and I want you to make macaroni and cheese like my mother did, go and do it now!"   Objective: Merlyn Albert is still loud and argumentative, but he continues to be a bit more clear today. He has had a shower today and clean scrubs. It is a drastic improvement. Diagnosis:  Schizophrenia paranoid type                      Hypertension                      DM type 2  ADL's:  Impaired  Sleep: fair 6.75 hours  Appetite:  Fair  Suicidal Ideation:  "NO!" Homicidal Ideation:  "No!" AEB (as evidenced by):   Psychiatric Specialty Exam: Review of Systems  Unable to perform ROS Constitutional: Negative.  Negative for fever, chills, weight loss, malaise/fatigue and diaphoresis.  HENT: Negative.  Negative for congestion and sore throat.   Eyes: Negative.  Negative for blurred vision, double vision and photophobia.  Respiratory: Negative.  Negative for cough, shortness of breath and wheezing.   Cardiovascular: Negative.  Negative for chest pain, palpitations and PND.  Gastrointestinal: Negative.  Negative for heartburn, nausea, vomiting, abdominal pain, diarrhea and constipation.  Genitourinary: Negative.   Musculoskeletal: Negative.  Negative for myalgias, joint pain and falls.  Skin: Negative.   Neurological: Negative.  Negative for dizziness, tingling, tremors, sensory change, speech change, focal weakness, seizures, loss of consciousness, weakness and headaches.  Endo/Heme/Allergies: Negative.  Negative for polydipsia. Does not bruise/bleed easily.  Psychiatric/Behavioral: Positive for hallucinations. Negative for depression, suicidal ideas, memory loss and substance abuse. The patient is not nervous/anxious and does not have insomnia.        Agitated    Blood pressure 140/70, pulse 69,  temperature 97.4 F (36.3 C), temperature source Oral, resp. rate 18, height 5' 9.5" (1.765 m), weight 80.74 kg (178 lb), SpO2 98.00%.Body mass index is 25.92 kg/(m^2).  General Appearance: GROOMED!  Eye Contact::  Poor  Speech: loud and goal directed  Volume:  Increased  Mood:  Less labile  Affect:  Labile and Full Range  Thought Process:  Disorganized but some some clearing is noted  Orientation:  Other:  unable to assess.  Thought Content:  Delusions and Hallucinations: talking to self  Suicidal Thoughts:  No  Homicidal Thoughts:  No  Memory:  unable to assess.  Judgement:  Poor  Insight:  Lacking but does ask appropriate questions regarding his medication.  Psychomotor Activity:  Increased  Concentration:  Poor  Recall:  Poor  Akathisia:  No  Handed:  Right  AIMS (if indicated):     Assets: minimal Sister is in [place], sister in [place]  Sleep:  Number of Hours: 3.5   Current Medications: Current Facility-Administered Medications  Medication Dose Route Frequency Provider Last Rate Last Dose  . acetaminophen (TYLENOL) tablet 650 mg  650 mg Oral Q6H PRN Shuvon Rankin, NP      . alum & mag hydroxide-simeth (MAALOX/MYLANTA) 200-200-20 MG/5ML suspension 30 mL  30 mL Oral Q4H PRN Shuvon Rankin, NP      . amLODipine (NORVASC) tablet 10 mg  10 mg Oral Daily Shuvon Rankin, NP   10 mg at 03/12/12 0818  . aspirin EC tablet  325 mg  325 mg Oral Daily Sanjuana Kava, NP   325 mg at 03/12/12 0810  . atenolol (TENORMIN) tablet 100 mg  100 mg Oral Daily Shuvon Rankin, NP   100 mg at 03/12/12 0826  . benztropine (COGENTIN) tablet 1 mg  1 mg Oral BID Verne Spurr, PA-C   1 mg at 03/12/12 1620  . calcium carbonate (TUMS - dosed in mg elemental calcium) chewable tablet 200 mg of elemental calcium  1 tablet Oral Q4H PRN Kerry Hough, PA      . diphenhydrAMINE (BENADRYL) capsule 50 mg  50 mg Oral Q6H PRN Verne Spurr, PA-C       Or  . diphenhydrAMINE (BENADRYL) injection 50 mg  50 mg  Intramuscular Q6H PRN Verne Spurr, PA-C      . diphenhydrAMINE (BENADRYL) injection 50 mg  50 mg Intramuscular Once Mojeed Akintayo      . famotidine (PEPCID) tablet 20 mg  20 mg Oral Q12H PRN Kerry Hough, PA   20 mg at 03/09/12 2312  . haloperidol (HALDOL) tablet 15 mg  15 mg Oral BID Verne Spurr, PA-C   15 mg at 03/12/12 1620   Or  . haloperidol lactate (HALDOL) injection 15 mg  15 mg Intramuscular BID Verne Spurr, PA-C      . haloperidol (HALDOL) tablet 5 mg  5 mg Oral Q6H PRN Mojeed Akintayo      . hydrALAZINE (APRESOLINE) tablet 10 mg  10 mg Oral Q6H PRN Shuvon Rankin, NP   10 mg at 03/09/12 1038  . LORazepam (ATIVAN) tablet 2 mg  2 mg Oral Q6H PRN Mojeed Akintayo   2 mg at 03/12/12 0051  . magnesium hydroxide (MILK OF MAGNESIA) suspension 30 mL  30 mL Oral Daily PRN Shuvon Rankin, NP      . menthol-cetylpyridinium (CEPACOL) lozenge 3 mg  1 lozenge Oral PRN Shuvon Rankin, NP      . Oxcarbazepine (TRILEPTAL) tablet 300 mg  300 mg Oral BID Mojeed Akintayo   300 mg at 03/12/12 1611    Lab Results:  Results for orders placed during the hospital encounter of 03/04/12 (from the past 48 hour(s))  GLUCOSE, CAPILLARY     Status: Abnormal   Collection Time    03/22/12 11:12 AM      Result Value Range   Glucose-Capillary 142 (*) 70 - 99 mg/dL  COMPREHENSIVE METABOLIC PANEL     Status: Abnormal   Collection Time    03/22/12 11:34 AM      Result Value Range   Sodium 132 (*) 135 - 145 mEq/L   Potassium 4.6  3.5 - 5.1 mEq/L   Chloride 97  96 - 112 mEq/L   CO2 28  19 - 32 mEq/L   Glucose, Bld 143 (*) 70 - 99 mg/dL   BUN 16  6 - 23 mg/dL   Creatinine, Ser 2.13  0.50 - 1.35 mg/dL   Calcium 8.7  8.4 - 08.6 mg/dL   Total Protein 6.2  6.0 - 8.3 g/dL   Albumin 3.1 (*) 3.5 - 5.2 g/dL   AST 8  0 - 37 U/L   ALT 6  0 - 53 U/L   Alkaline Phosphatase 62  39 - 117 U/L   Total Bilirubin 0.3  0.3 - 1.2 mg/dL   GFR calc non Af Amer 55 (*) >90 mL/min   GFR calc Af Amer 64 (*) >90 mL/min    Comment:  The eGFR has been calculated     using the CKD EPI equation.     This calculation has not been     validated in all clinical     situations.     eGFR's persistently     <90 mL/min signify     possible Chronic Kidney Disease.  CBC WITH DIFFERENTIAL     Status: Abnormal   Collection Time    03/22/12 11:34 AM      Result Value Range   WBC 11.2 (*) 4.0 - 10.5 K/uL   RBC 4.16 (*) 4.22 - 5.81 MIL/uL   Hemoglobin 12.1 (*) 13.0 - 17.0 g/dL   HCT 11.9 (*) 14.7 - 82.9 %   MCV 86.3  78.0 - 100.0 fL   MCH 29.1  26.0 - 34.0 pg   MCHC 33.7  30.0 - 36.0 g/dL   RDW 56.2  13.0 - 86.5 %   Platelets 404 (*) 150 - 400 K/uL   Neutrophils Relative 64  43 - 77 %   Neutro Abs 7.2  1.7 - 7.7 K/uL   Lymphocytes Relative 24  12 - 46 %   Lymphs Abs 2.7  0.7 - 4.0 K/uL   Monocytes Relative 9  3 - 12 %   Monocytes Absolute 1.0  0.1 - 1.0 K/uL   Eosinophils Relative 3  0 - 5 %   Eosinophils Absolute 0.3  0.0 - 0.7 K/uL   Basophils Relative 1  0 - 1 %   Basophils Absolute 0.1  0.0 - 0.1 K/uL  HEMOGLOBIN A1C     Status: Abnormal   Collection Time    03/22/12 11:34 AM      Result Value Range   Hemoglobin A1C 8.8 (*) <5.7 %   Comment: (NOTE)                                                                               According to the ADA Clinical Practice Recommendations for 2011, when     HbA1c is used as a screening test:      >=6.5%   Diagnostic of Diabetes Mellitus               (if abnormal result is confirmed)     5.7-6.4%   Increased risk of developing Diabetes Mellitus     References:Diagnosis and Classification of Diabetes Mellitus,Diabetes     Care,2011,34(Suppl 1):S62-S69 and Standards of Medical Care in             Diabetes - 2011,Diabetes Care,2011,34 (Suppl 1):S11-S61.   Mean Plasma Glucose 206 (*) <117 mg/dL  LIPID PANEL     Status: Abnormal   Collection Time    03/22/12 11:34 AM      Result Value Range   Cholesterol 199  0 - 200 mg/dL   Triglycerides 784 (*)  <150 mg/dL   HDL 28 (*) >69 mg/dL   Total CHOL/HDL Ratio 7.1     VLDL 54 (*) 0 - 40 mg/dL   LDL Cholesterol 629 (*) 0 - 99 mg/dL   Comment:            Total Cholesterol/HDL:CHD Risk     Coronary Heart Disease Risk  Table                         Men   Women      1/2 Average Risk   3.4   3.3      Average Risk       5.0   4.4      2 X Average Risk   9.6   7.1      3 X Average Risk  23.4   11.0                Use the calculated Patient Ratio     above and the CHD Risk Table     to determine the patient's CHD Risk.                ATP III CLASSIFICATION (LDL):      <100     mg/dL   Optimal      914-782  mg/dL   Near or Above                        Optimal      130-159  mg/dL   Borderline      956-213  mg/dL   High      >086     mg/dL   Very High  TSH     Status: None   Collection Time    03/22/12 11:34 AM      Result Value Range   TSH 0.983  0.350 - 4.500 uIU/mL  T3, FREE     Status: Abnormal   Collection Time    03/22/12 11:34 AM      Result Value Range   T3, Free 2.2 (*) 2.3 - 4.2 pg/mL  T4, FREE     Status: None   Collection Time    03/22/12 11:34 AM      Result Value Range   Free T4 0.86  0.80 - 1.80 ng/dL  RPR     Status: None   Collection Time    03/22/12 11:34 AM      Result Value Range   RPR NON REACTIVE  NON REACTIVE  HIV ANTIBODY (ROUTINE TESTING)     Status: None   Collection Time    03/22/12 11:34 AM      Result Value Range   HIV NON REACTIVE  NON REACTIVE  FOLATE     Status: None   Collection Time    03/22/12 11:34 AM      Result Value Range   Folate 6.9     Comment: (NOTE)     Reference Ranges            Deficient:       0.4 - 3.3 ng/mL            Indeterminate:   3.4 - 5.4 ng/mL            Normal:              > 5.4 ng/mL  VITAMIN B12     Status: None   Collection Time    03/22/12 11:34 AM      Result Value Range   Vitamin B-12 273  211 - 911 pg/mL  MAGNESIUM     Status: None   Collection Time    03/22/12 11:34 AM      Result Value Range    Magnesium 2.1  1.5 - 2.5 mg/dL  PHOSPHORUS     Status: None   Collection Time    03/22/12 11:34 AM      Result Value Range   Phosphorus 2.8  2.3 - 4.6 mg/dL  PROTIME-INR     Status: None   Collection Time    03/22/12 11:34 AM      Result Value Range   Prothrombin Time 13.8  11.6 - 15.2 seconds   INR 1.07  0.00 - 1.49  URINALYSIS, ROUTINE W REFLEX MICROSCOPIC     Status: Abnormal   Collection Time    03/22/12  6:54 PM      Result Value Range   Color, Urine YELLOW  YELLOW   APPearance CLEAR  CLEAR   Specific Gravity, Urine 1.016  1.005 - 1.030   pH 6.0  5.0 - 8.0   Glucose, UA 100 (*) NEGATIVE mg/dL   Hgb urine dipstick NEGATIVE  NEGATIVE   Bilirubin Urine NEGATIVE  NEGATIVE   Ketones, ur NEGATIVE  NEGATIVE mg/dL   Protein, ur 161 (*) NEGATIVE mg/dL   Urobilinogen, UA 0.2  0.0 - 1.0 mg/dL   Nitrite NEGATIVE  NEGATIVE   Leukocytes, UA NEGATIVE  NEGATIVE  URINE MICROSCOPIC-ADD ON     Status: None   Collection Time    03/22/12  6:54 PM      Result Value Range   Squamous Epithelial / LPF RARE  RARE   WBC, UA 0-2  <3 WBC/hpf   RBC / HPF 0-2  <3 RBC/hpf   Bacteria, UA RARE  RARE    Physical Findings: AIMS: Facial and Oral Movements Muscles of Facial Expression: None, normal Lips and Perioral Area: None, normal Jaw: None, normal Tongue: None, normal,Extremity Movements Upper (arms, wrists, hands, fingers): None, normal Lower (legs, knees, ankles, toes): None, normal, Trunk Movements Neck, shoulders, hips: None, normal, Overall Severity Severity of abnormal movements (highest score from questions above): None, normal Incapacitation due to abnormal movements: None, normal Patient's awareness of abnormal movements (rate only patient's report): No Awareness, Dental Status Current problems with teeth and/or dentures?: Yes Does patient usually wear dentures?: No  CIWA:    COWS:     Treatment Plan Summary: Daily contact with patient to assess and evaluate symptoms and progress  in treatment Medication management  Plan: 1. Patient is seen and the chart is reviewed, labs are reviewed. 2. Add metformin 500 with breakfast. Will add CBGs as patient will allow. 3. Encourage patient to participates in unit activities and other milieu. 4. Continue the Haldol to 30mg  per day, ie 15mg  po BID for delusions 5.Increase  Trileptal to 900mg  po BID for mood stabilization with goal to maximize dosage. 6. Will get daily weights to monitor for decreased oral intake. 7. Will hold off on treating lipids at this point, have IM make that determination. 8. Ekg is reviewed and as the QT is at 446 will not add another antipsychotic at this time. Medical Decision Making Problem Points:  Established problem, stable/improving (1), New problem, with additional work-up planned (4), Review of last therapy session (1) and Review of psycho-social stressors (1) Data Points:  Order Aims Assessment (2) Review of medication regiment & side effects (2) Review of new medications or change in dosage (2)  I certify that inpatient services furnished can reasonably be expected to improve the patient's condition.   Rona Ravens. Meril Dray [facility] 03/24/2012

## 2012-03-25 NOTE — Progress Notes (Signed)
Recreation Therapy Notes   Date: 02.28.2014 Time: 9:30am Location: [address] Day Room      Group Topic/Focus: Communication  Participation Level: Did not attend  [employer], LRT/CTRS    Jearl Klinefelter 03/25/2012 11:27 AM

## 2012-03-25 NOTE — Progress Notes (Signed)
[facility] LCSW Aftercare Discharge Planning Group Note  03/25/2012 9:43 AM  Participation: DID NOT ATTEND   Smart, Ledell Peoples 03/25/2012, 9:43 AM

## 2012-03-25 NOTE — Progress Notes (Signed)
  D) Patient irritable upon my assessment. Patient refuses to  complete Patient Self Inventory.Patient is loud and demanding with staff at times. Patient observed with unsteady gait intermittently. Patient educated to increased fall risk assessment, patient reminded to call for assistance. Patient given lengthened call bell, patient refuses to wear non skid socks.  Patient denies SI/HI, denies A/V hallucinations.   A) Patient offered support and encouragement, patient encouraged to discuss feelings/concerns with staff. Patient verbalized understanding. Patient monitored Q15 minutes for safety. Patient met with MD  to discuss today's goals and plan of care.  R) Patient isolates to room at times, patient has meals in room. Patient demanding with staff at times.   Patient taking medications as ordered with maximum encouragement from staff.  Will continue to monitor.

## 2012-03-26 LAB — GLUCOSE, CAPILLARY: Glucose-Capillary: 123 mg/dL — ABNORMAL HIGH (ref 70–99)

## 2012-03-26 NOTE — Progress Notes (Signed)
Psychoeducational Group Note  Date:  03/26/2012 Time:  0945 am  Group Topic/Focus:  Identifying Needs:   The focus of this group is to help patients identify their personal needs that have been historically problematic and identify healthy behaviors to address their needs.  Participation Level:  Did Not Attend   Andrena Mews 03/26/2012,10:51 AM

## 2012-03-26 NOTE — Progress Notes (Signed)
Patient ID: David Lang, male   DOB: 07/07/1950, 62 y.o.   MRN: [id number]  D: Pt was given call light that reaches to bed, and informed to use it if he plans to get out of bed. Pt denies SI/HI/AVH. Pt is cooperative today. PT continues to be demanding and argumentative, but his overall demeanor has improved.   A: Pt was offered support and encouragement.  Pt was encourage to attend groups. Q 15 minute checks were done for safety.    R: Pt is taking medication.Pt receptive to treatment and safety maintained on unit.

## 2012-03-26 NOTE — Progress Notes (Signed)
Patient ID: David Lang, male   DOB: Apr 25, 1950, 62 y.o.   MRN: [id number] 03-26-2012 nursing shift note: D: pt continues to be loud and demanding. A: pt took his medications with a great deal of encouragement. This pt needs a great deal of patience from staff when taking his medications. Had environmental personnel clean up his room. RN changed his bed. R: pt took all of his medications.  RN will continue to monitor and Q 15 min ck's continue.

## 2012-03-26 NOTE — Clinical Social Work Note (Signed)
[facility] Group Notes: (Clinical Social Work)   03/26/2012      Type of Therapy:  Group Therapy   Participation Level:  Did Not Attend    Damico Partin Grossman-Orr, LCSW 03/26/2012, 12:47 PM     

## 2012-03-26 NOTE — Progress Notes (Signed)
Patient ID: David Lang, male   DOB: [birth year], 62 y.o.   MRN: [id number] [facility] MD Progress Note  03/26/2012 9:02 AM David Lang  MRN:  [id number]  Subjective: Patient seen and chart reviewed.  He remains very irritable agitated and angry.  He continues to speak very loud and she'll resistance to take medication.  He has limited participation in group.  He stays to his room most of the time.  His ADLs remains poor.  He sleeping on and off.  When asked about his symptoms he start cursing and yelling.    Diagnosis:  Schizophrenia paranoid type                      Hypertension                      DM type 2  ADL's:  Impaired  Sleep: fair   Appetite:  Fair  Suicidal Ideation:  Unable to assess;"I won't answer that" Homicidal Ideation:  Unable to assess;"I won't answer that" AEB (as evidenced by):   Psychiatric Specialty Exam: Review of Systems  Unable to perform ROS Constitutional: Negative.   HENT: Negative.   Eyes: Negative.   Respiratory: Negative.   Cardiovascular: Negative.   Gastrointestinal: Negative.   Genitourinary: Negative.   Musculoskeletal: Negative.   Skin: Negative.   Neurological: Negative.   Endo/Heme/Allergies: Negative.   Psychiatric/Behavioral: Positive for hallucinations.       Agitated, irritable and paranoid.    Blood pressure 116/71, pulse 62, temperature 98.1 F (36.7 C), temperature source Oral, resp. rate 20, height 5' 9.5" (1.765 m), weight 80.74 kg (178 lb), SpO2 98.00%.Body mass index is 25.92 kg/(m^2).  General Appearance: fairly groomed  Patent attorney::  Poor  Speech:  Garbled and loud  Volume:  Increased  Mood:  less Irritable  Affect:  Full range  Thought Process:  Disorganized  Orientation:  Other:  unable to assess.  Thought Content:  Delusions and Hallucinations: talking to self  Suicidal Thoughts:  No  Homicidal Thoughts:  No  Memory:  unable to assess.  Judgement:  Poor  Insight:  Lacking  Psychomotor Activity:  Increased   Concentration:  Poor  Recall:  Poor  Akathisia:  No  Handed:  Right  AIMS (if indicated):     Assets: minimal Sister is in [place]  Sleep:  Number of Hours: 4.75   Current Medications: Current Facility-Administered Medications  Medication Dose Route Frequency Provider Last Rate Last Dose  . acetaminophen (TYLENOL) tablet 650 mg  650 mg Oral Q6H PRN Shuvon Rankin, NP      . alum & mag hydroxide-simeth (MAALOX/MYLANTA) 200-200-20 MG/5ML suspension 30 mL  30 mL Oral Q4H PRN Shuvon Rankin, NP      . amLODipine (NORVASC) tablet 10 mg  10 mg Oral Daily Shuvon Rankin, NP   10 mg at 03/12/12 0818  . aspirin EC tablet 325 mg  325 mg Oral Daily Sanjuana Kava, NP   325 mg at 03/12/12 0810  . atenolol (TENORMIN) tablet 100 mg  100 mg Oral Daily Shuvon Rankin, NP   100 mg at 03/12/12 0826  . benztropine (COGENTIN) tablet 1 mg  1 mg Oral BID Verne Spurr, PA-C   1 mg at 03/12/12 1620  . calcium carbonate (TUMS - dosed in mg elemental calcium) chewable tablet 200 mg of elemental calcium  1 tablet Oral Q4H PRN Kerry Hough, PA      . diphenhydrAMINE (BENADRYL) capsule  50 mg  50 mg Oral Q6H PRN Verne Spurr, PA-C       Or  . diphenhydrAMINE (BENADRYL) injection 50 mg  50 mg Intramuscular Q6H PRN Verne Spurr, PA-C      . diphenhydrAMINE (BENADRYL) injection 50 mg  50 mg Intramuscular Once Mojeed Akintayo      . famotidine (PEPCID) tablet 20 mg  20 mg Oral Q12H PRN Kerry Hough, PA   20 mg at 03/09/12 2312  . haloperidol (HALDOL) tablet 15 mg  15 mg Oral BID Verne Spurr, PA-C   15 mg at 03/12/12 1620   Or  . haloperidol lactate (HALDOL) injection 15 mg  15 mg Intramuscular BID Verne Spurr, PA-C      . haloperidol (HALDOL) tablet 5 mg  5 mg Oral Q6H PRN Mojeed Akintayo      . hydrALAZINE (APRESOLINE) tablet 10 mg  10 mg Oral Q6H PRN Shuvon Rankin, NP   10 mg at 03/09/12 1038  . LORazepam (ATIVAN) tablet 2 mg  2 mg Oral Q6H PRN Mojeed Akintayo   2 mg at 03/12/12 0051  . magnesium hydroxide  (MILK OF MAGNESIA) suspension 30 mL  30 mL Oral Daily PRN Shuvon Rankin, NP      . menthol-cetylpyridinium (CEPACOL) lozenge 3 mg  1 lozenge Oral PRN Shuvon Rankin, NP      . Oxcarbazepine (TRILEPTAL) tablet 300 mg  300 mg Oral BID Mojeed Akintayo   300 mg at 03/12/12 1611    Lab Results:  Results for orders placed during the hospital encounter of 03/04/12 (from the past 48 hour(s))  GLUCOSE, CAPILLARY     Status: Abnormal   Collection Time    03/25/12  9:24 PM      Result Value Range   Glucose-Capillary 145 (*) 70 - 99 mg/dL    Physical Findings: AIMS: Facial and Oral Movements Muscles of Facial Expression: None, normal Lips and Perioral Area: None, normal Jaw: None, normal Tongue: None, normal,Extremity Movements Upper (arms, wrists, hands, fingers): None, normal Lower (legs, knees, ankles, toes): None, normal, Trunk Movements Neck, shoulders, hips: None, normal, Overall Severity Severity of abnormal movements (highest score from questions above): None, normal Incapacitation due to abnormal movements: None, normal Patient's awareness of abnormal movements (rate only patient's report): No Awareness, Dental Status Current problems with teeth and/or dentures?: Yes Does patient usually wear dentures?: No  CIWA:    COWS:     Treatment Plan Summary: Daily contact with patient to assess and evaluate symptoms and progress in treatment Medication management  Plan: 1. Will continue patient on current medication regimen 2. Will ask male tech to assist patient with shower or bath today if possible. 3. Encourage patient to participates in unit activities and other milieu. 4. Continue the Haldol to 30mg  per day, ie 15mg  po BID for delusions 5.Continue Trileptal 900mg  po BID for mood stabilization 5. Will get daily weights to monitor for decreased oral intake. Medical Decision Making Problem Points:  Established problem, stable/improving (1), New problem, with additional work-up planned  (4), Review of last therapy session (1) and Review of psycho-social stressors (1) Data Points:  Order Aims Assessment (2) Review of medication regiment & side effects (2) Review of new medications or change in dosage (2)  I certify that inpatient services furnished can reasonably be expected to improve the patient's condition.   03/26/2012, 9:02 AM

## 2012-03-26 NOTE — Progress Notes (Signed)
Patient ID: David Lang, male   DOB: January 27, 1950, 62 y.o.   MRN: [id number] Psychoeducational Group Note  Date:  03/26/2012 Time:0930am  Group Topic/Focus:  Identifying Needs:   The focus of this group is to help patients identify their personal needs that have been historically problematic and identify healthy behaviors to address their needs.  Participation Level:  Did Not Attend  Participation Quality:    Affect:   Cognitive:  Insight: Engagement in Group:  Additional Comments: inventory group-unable to attend   [name] 03/26/2012,10:03 AM

## 2012-03-27 LAB — GLUCOSE, CAPILLARY
Glucose-Capillary: 120 mg/dL — ABNORMAL HIGH (ref 70–99)
Glucose-Capillary: 137 mg/dL — ABNORMAL HIGH (ref 70–99)

## 2012-03-27 NOTE — Progress Notes (Signed)
Patient ID: David Lang, male   DOB: 1950-03-21, 62 y.o.   MRN: [id number] [facility] MD Progress Note  03/27/2012 9:22 AM David Lang  MRN:  [id number]  Subjective: Patient seen and chart reviewed.  Patient is on one-to-one for followup precaution.  He continued to requires excessive reminders for his medication.  He is loud irritable angry and does not engage in conversation.  His his speech is pressured .  He has limited participation in group.  He stays to his room most of the time.  His ADLs remains poor.  He sleeping on and off.  When asked about his symptoms he start cursing and yelling.    Diagnosis:  Schizophrenia paranoid type                      Hypertension                      DM type 2  ADL's:  Impaired  Sleep: fair   Appetite:  Fair  Suicidal Ideation:  Unable to assess;"I won't answer that" Homicidal Ideation:  Unable to assess;"I won't answer that" AEB (as evidenced by):   Psychiatric Specialty Exam: Review of Systems  Unable to perform ROS Constitutional: Negative.   HENT: Negative.   Eyes: Negative.   Respiratory: Negative.   Cardiovascular: Negative.   Gastrointestinal: Negative.   Genitourinary: Negative.   Musculoskeletal: Negative.   Skin: Negative.   Neurological: Negative.   Endo/Heme/Allergies: Negative.   Psychiatric/Behavioral: Positive for hallucinations.       Agitated, irritable and paranoid.    Blood pressure 140/82, pulse 82, temperature 96.6 F (35.9 C), temperature source Oral, resp. rate 17, height 5' 9.5" (1.765 m), weight 80.74 kg (178 lb), SpO2 98.00%.Body mass index is 25.92 kg/(m^2).  General Appearance: fairly groomed  Patent attorney::  Poor  Speech:  Garbled and loud  Volume:  Increased  Mood:  less Irritable  Affect:  Full range  Thought Process:  Disorganized  Orientation:  Other:  unable to assess.  Thought Content:  Delusions and Hallucinations: talking to self  Suicidal Thoughts:  No  Homicidal Thoughts:  No  Memory:  unable to  assess.  Judgement:  Poor  Insight:  Lacking  Psychomotor Activity:  Increased  Concentration:  Poor  Recall:  Poor  Akathisia:  No  Handed:  Right  AIMS (if indicated):     Assets: minimal Sister is in [place]  Sleep:  Number of Hours: 4.25   Current Medications: Current Facility-Administered Medications  Medication Dose Route Frequency Provider Last Rate Last Dose  . acetaminophen (TYLENOL) tablet 650 mg  650 mg Oral Q6H PRN Shuvon Rankin, NP      . alum & mag hydroxide-simeth (MAALOX/MYLANTA) 200-200-20 MG/5ML suspension 30 mL  30 mL Oral Q4H PRN Shuvon Rankin, NP      . amLODipine (NORVASC) tablet 10 mg  10 mg Oral Daily Shuvon Rankin, NP   10 mg at 03/12/12 0818  . aspirin EC tablet 325 mg  325 mg Oral Daily Sanjuana Kava, NP   325 mg at 03/12/12 0810  . atenolol (TENORMIN) tablet 100 mg  100 mg Oral Daily Shuvon Rankin, NP   100 mg at 03/12/12 0826  . benztropine (COGENTIN) tablet 1 mg  1 mg Oral BID Verne Spurr, PA-C   1 mg at 03/12/12 1620  . calcium carbonate (TUMS - dosed in mg elemental calcium) chewable tablet 200 mg of elemental calcium  1 tablet  Oral Q4H PRN Kerry Hough, PA      . diphenhydrAMINE (BENADRYL) capsule 50 mg  50 mg Oral Q6H PRN Verne Spurr, PA-C       Or  . diphenhydrAMINE (BENADRYL) injection 50 mg  50 mg Intramuscular Q6H PRN Verne Spurr, PA-C      . diphenhydrAMINE (BENADRYL) injection 50 mg  50 mg Intramuscular Once Mojeed Akintayo      . famotidine (PEPCID) tablet 20 mg  20 mg Oral Q12H PRN Kerry Hough, PA   20 mg at 03/09/12 2312  . haloperidol (HALDOL) tablet 15 mg  15 mg Oral BID Verne Spurr, PA-C   15 mg at 03/12/12 1620   Or  . haloperidol lactate (HALDOL) injection 15 mg  15 mg Intramuscular BID Verne Spurr, PA-C      . haloperidol (HALDOL) tablet 5 mg  5 mg Oral Q6H PRN Mojeed Akintayo      . hydrALAZINE (APRESOLINE) tablet 10 mg  10 mg Oral Q6H PRN Shuvon Rankin, NP   10 mg at 03/09/12 1038  . LORazepam (ATIVAN) tablet 2 mg   2 mg Oral Q6H PRN Mojeed Akintayo   2 mg at 03/12/12 0051  . magnesium hydroxide (MILK OF MAGNESIA) suspension 30 mL  30 mL Oral Daily PRN Shuvon Rankin, NP      . menthol-cetylpyridinium (CEPACOL) lozenge 3 mg  1 lozenge Oral PRN Shuvon Rankin, NP      . Oxcarbazepine (TRILEPTAL) tablet 300 mg  300 mg Oral BID Mojeed Akintayo   300 mg at 03/12/12 1611    Lab Results:  Results for orders placed during the hospital encounter of 03/04/12 (from the past 48 hour(s))  GLUCOSE, CAPILLARY     Status: Abnormal   Collection Time    03/25/12  9:24 PM      Result Value Range   Glucose-Capillary 145 (*) 70 - 99 mg/dL  GLUCOSE, CAPILLARY     Status: Abnormal   Collection Time    03/26/12 11:02 AM      Result Value Range   Glucose-Capillary 123 (*) 70 - 99 mg/dL  GLUCOSE, CAPILLARY     Status: Abnormal   Collection Time    03/27/12  8:33 AM      Result Value Range   Glucose-Capillary 120 (*) 70 - 99 mg/dL    Physical Findings: AIMS: Facial and Oral Movements Muscles of Facial Expression: None, normal Lips and Perioral Area: None, normal Jaw: None, normal Tongue: None, normal,Extremity Movements Upper (arms, wrists, hands, fingers): None, normal Lower (legs, knees, ankles, toes): None, normal, Trunk Movements Neck, shoulders, hips: None, normal, Overall Severity Severity of abnormal movements (highest score from questions above): None, normal Incapacitation due to abnormal movements: None, normal Patient's awareness of abnormal movements (rate only patient's report): No Awareness, Dental Status Current problems with teeth and/or dentures?: Yes Does patient usually wear dentures?: No  CIWA:    COWS:     Treatment Plan Summary: Daily contact with patient to assess and evaluate symptoms and progress in treatment Medication management  Plan: 1. Will continue patient on current medication regimen 2. Will ask male tech to assist patient with shower or bath today if possible. 3. Encourage  patient to participates in unit activities and other milieu. 4. Continue the Haldol to 30mg  per day, ie 15mg  po BID for delusions 5.Continue Trileptal 900mg  po BID for mood stabilization 5. Will get daily weights to monitor for decreased oral intake. Continue one to one for fall  precaution. Medical Decision Making Problem Points:  Established problem, stable/improving (1), New problem, with additional work-up planned (4), Review of last therapy session (1) and Review of psycho-social stressors (1) Data Points:  Order Aims Assessment (2) Review of medication regiment & side effects (2) Review of new medications or change in dosage (2)  I certify that inpatient services furnished can reasonably be expected to improve the patient's condition.   03/27/2012, 9:22 AM

## 2012-03-27 NOTE — Clinical Social Work Note (Signed)
[facility] Group Notes: (Clinical Social Work)   03/27/2012      Type of Therapy:  Group Therapy   Participation Level:  Did Not Attend    Ambrose Mantle, LCSW 03/27/2012, 12:30 PM

## 2012-03-27 NOTE — Progress Notes (Signed)
D: Pt climbed out of bed repeatedly during shift, was unable to stand and had to sit on floor until staff arrived multiple times.  Pt denies having fallen on any of these occasions; physical assessment shows no sign of bruising or injury.  1:1 Constant Observation order obtained from Molli Knock NP @0326  for safety/fall prevention.  Initial nursing note is on observation board.  Pt irritable, argumentative, and demanding during shift.  Refused 1700 and 2100 CBGs.  Thought content disorganized with moderate confusion at times; paranoid delusions evident.  However, Pt denies SI/HI, AVH, and acute pain and contracted for safety on unit. Complained of acid reflux.  A: Much support provided- verbal and physical assists.  PRNs: Ativan 2mg  PO @2003 , Maalox 30cc PO @ 2217.  All medications administered according to med orders and POC.  Q15 minute safety checks maintained as per unit protocol until 1:1 instituted; frequent nursing checks conducted as well.  R: Pt currently sleeping with sitter at bedside.  Safety maintained, now with 1:1 constant observation in place. Dion Saucier RN

## 2012-03-27 NOTE — Progress Notes (Signed)
03/27/2012  0600     1:1 Constant Observation Nursing Note  Pt continues on 1:1 constant observation for safety/fall prevention.  Currently is resting, sleeping on and off, sitter at bedside.  Safety maintained. Dion Saucier RN

## 2012-03-27 NOTE — Progress Notes (Signed)
Patient ID: David Lang, male   DOB: July 17, 1950, 62 y.o.   MRN: [id number] Psychoeducational Group Note  Date:  03/27/2012 Time:  0930am  Group Topic/Focus:  Making Healthy Choices:   The focus of this group is to help patients identify negative/unhealthy choices they were using prior to admission and identify positive/healthier coping strategies to replace them upon discharge.  Participation Level:  Did Not Attend  Participation Quality:    Affect:   Cognitive: Insight:  Engagement in Group:  Additional Comments:  Inventory group unable to attend  [name] 03/27/2012,10:07 AM

## 2012-03-27 NOTE — Progress Notes (Signed)
Patient ID: David Lang, male   DOB: 1950-12-21, 62 y.o.   MRN: [id number] 03-27-2012@ 1800 nursing 1:1 note: D: pt behaviors have not changed much throughout the day. He is taking his medication with encouragement. He continues to be loud.  He ate 100% of his dinner and drank 240 cc of fluids. He urinated x 1. A:  Staff continues to encourage and support this patient. R: he remains weak and confused. He lacks any insight into his situation. Staff was unable to obtain his daily wt per order.  1:1 continues.

## 2012-03-27 NOTE — Progress Notes (Addendum)
Patient ID: David Lang, male   DOB: 06-12-1950, 63 y.o.   MRN: [id number] 03-27-12 @ 1400 nursing 1:1 note: pt was ambulated to the dayroom today. He didn't stay long but staff wanted him out of the bed. He didn't come to group. A:He  took his medications with a great deal of encouragement.  Staff continues to work diligently with this patient. He ate 60% of his lunch and has taken in 860 cc of fluid.   He was taken to the phone room and spoke with his sister by phone. He has been toileted. R: he is weak, still disorganized but their may be mild improvement. He is still loud. Staff was unable to obtain a daily wt. 1:1 continues.

## 2012-03-27 NOTE — Progress Notes (Signed)
Patient ID: David Lang, male   DOB: Dec 16, 1950, 62 y.o.   MRN: [id number] 03-27-12 @1000   nursing 1:1 note: D: pt did not eat his breakfast. He has been agitated and yelling this am. He urinated x1.  A: staff encouraged pt to ambulate to the day room and to take am medications.  R: pt has calmed down since taking am medications. He did ambulate down the hall way. The 1:1 continues and the order was renewed by rn today.

## 2012-03-27 NOTE — Progress Notes (Signed)
Psychoeducational Group Note  Date:  03/27/2012 Time:  0945 am  Group Topic/Focus:  Making Healthy Choices:   The focus of this group is to help patients identify negative/unhealthy choices they were using prior to admission and identify positive/healthier coping strategies to replace them upon discharge.  Participation Level:  Did Not Attend   Lang, David J 03/27/2012, 10:29 AM 

## 2012-03-28 LAB — GLUCOSE, CAPILLARY: Glucose-Capillary: 100 mg/dL — ABNORMAL HIGH (ref 70–99)

## 2012-03-28 MED ORDER — CLONAZEPAM 0.5 MG PO TABS
0.5000 mg | ORAL_TABLET | Freq: Two times a day (BID) | ORAL | Status: DC
  Administered 2012-03-28 – 2012-03-29 (×2): 0.5 mg via ORAL
  Filled 2012-03-28 (×2): qty 1

## 2012-03-28 NOTE — Progress Notes (Signed)
Adult Psychoeducational Group Note  Date:  03/28/2012 Time:  11:00am  Group Topic/Focus:  Wellness Toolbox:   The focus of this group is to discuss various aspects of wellness, balancing those aspects and exploring ways to increase the ability to experience wellness.  Patients will create a wellness toolbox for use upon discharge.  Participation Level:  Did Not Attend  Participation Quality:    Affect:    Cognitive:    Insight:   Engagement in Group:    Modes of Intervention:    Additional Comments:  Pt has been in the bed all day, pt has no been feeling well.  David Lang M 03/28/2012, 12:25 PM

## 2012-03-28 NOTE — Progress Notes (Signed)
03/27/2012 2200  1:1 Constant Observation Nursing Note  D: Pt continues on 1:1 constant observation with sitter at bedside for safety/fall prevention.  Pt has tried to get out of bed when half-asleep but was directable.  Remains restless, irritable, but less argumentative and demanding than is usual.  A: 1:1 constant observation continued, sitter at bedside.  R: Safety maintained. Dion Saucier RN

## 2012-03-28 NOTE — Progress Notes (Signed)
Patient ID: David Lang, male   DOB: February 16, 1950, 62 y.o.   MRN: [id number] [facility] MD Progress Note  03/27/2012 9:22 AM David Lang  MRN:  [id number]  Subjective: "I can't answer that ..." David Lang states when asked how he is doing. He states that he is "not a regular guy, he is special." Objective: Patient seen and chart reviewed.  Patient is on one-to-one for fall precaution. He is unstable on his feet.  His speech is slower today.  He is not ranting as usual, and he is not as talkative as he normally is.  Diagnosis:  Schizophrenia paranoid type                      Hypertension                      DM type 2  ADL's:  Impaired  Sleep: fair   Appetite:  Fair  Suicidal Ideation:  Unable to assess;"I won't answer that" Homicidal Ideation:  Unable to assess;"I won't answer that" AEB (as evidenced by):   Psychiatric Specialty Exam: Review of Systems  Unable to perform ROS Constitutional: Negative.   HENT: Negative.   Eyes: Negative.   Respiratory: Negative.   Cardiovascular: Negative.   Gastrointestinal: Negative.   Genitourinary: Negative.   Musculoskeletal: Negative.   Skin: Negative.   Neurological: Negative.   Endo/Heme/Allergies: Negative.   Psychiatric/Behavioral: Positive for hallucinations.       Blood pressure 140/82, pulse 82, temperature 96.6 F (35.9 C), temperature source Oral, resp. rate 17, height 5' 9.5" (1.765 m), weight 80.74 kg (178 lb), SpO2 98.00%.Body mass index is 25.92 kg/(m^2).  General Appearance: fairly groomed  Patent attorney::  Poor  Speech:  Slower and less spontaneous  Volume:  Increased  Mood:  depressed  Affect:  Full range  Thought Process:  Disorganized  Orientation:  Other:  unable to assess.  Thought Content:  Delusions and Hallucinations: talking to self  Suicidal Thoughts:  No  Homicidal Thoughts:  No  Memory:  unable to assess.  Judgement:  Poor  Insight:  Lacking  Psychomotor Activity:  Increased  Concentration:  Poor  Recall:  Poor   Akathisia:  No  Handed:  Right  AIMS (if indicated):     Assets: minimal Sister is in [place]  Sleep:  Number of Hours: 4.25   Current Medications: Current Facility-Administered Medications  Medication Dose Route Frequency David Lang Last Rate Last Dose  . acetaminophen (TYLENOL) tablet 650 mg  650 mg Oral Q6H PRN Shuvon Rankin, NP      . alum & mag hydroxide-simeth (MAALOX/MYLANTA) 200-200-20 MG/5ML suspension 30 mL  30 mL Oral Q4H PRN Shuvon Rankin, NP      . amLODipine (NORVASC) tablet 10 mg  10 mg Oral Daily Shuvon Rankin, NP   10 mg at 03/12/12 0818  . aspirin EC tablet 325 mg  325 mg Oral Daily Sanjuana Kava, NP   325 mg at 03/12/12 0810  . atenolol (TENORMIN) tablet 100 mg  100 mg Oral Daily Shuvon Rankin, NP   100 mg at 03/12/12 0826  . benztropine (COGENTIN) tablet 1 mg  1 mg Oral BID Verne Spurr, PA-C   1 mg at 03/12/12 1620  . calcium carbonate (TUMS - dosed in mg elemental calcium) chewable tablet 200 mg of elemental calcium  1 tablet Oral Q4H PRN Kerry Hough, PA      . diphenhydrAMINE (BENADRYL) capsule 50 mg  50 mg Oral  Q6H PRN Verne Spurr, PA-C       Or  . diphenhydrAMINE (BENADRYL) injection 50 mg  50 mg Intramuscular Q6H PRN Verne Spurr, PA-C      . diphenhydrAMINE (BENADRYL) injection 50 mg  50 mg Intramuscular Once Mojeed Akintayo      . famotidine (PEPCID) tablet 20 mg  20 mg Oral Q12H PRN Kerry Hough, PA   20 mg at 03/09/12 2312  . haloperidol (HALDOL) tablet 15 mg  15 mg Oral BID Verne Spurr, PA-C   15 mg at 03/12/12 1620   Or  . haloperidol lactate (HALDOL) injection 15 mg  15 mg Intramuscular BID Verne Spurr, PA-C      . haloperidol (HALDOL) tablet 5 mg  5 mg Oral Q6H PRN Mojeed Akintayo      . hydrALAZINE (APRESOLINE) tablet 10 mg  10 mg Oral Q6H PRN Shuvon Rankin, NP   10 mg at 03/09/12 1038  . LORazepam (ATIVAN) tablet 2 mg  2 mg Oral Q6H PRN Mojeed Akintayo   2 mg at 03/12/12 0051  . magnesium hydroxide (MILK OF MAGNESIA) suspension 30 mL   30 mL Oral Daily PRN Shuvon Rankin, NP      . menthol-cetylpyridinium (CEPACOL) lozenge 3 mg  1 lozenge Oral PRN Shuvon Rankin, NP      . Oxcarbazepine (TRILEPTAL) tablet 300 mg  300 mg Oral BID Mojeed Akintayo   300 mg at 03/12/12 1611    Lab Results:  Results for orders placed during the hospital encounter of 03/04/12 (from the past 48 hour(s))  GLUCOSE, CAPILLARY     Status: Abnormal   Collection Time    03/25/12  9:24 PM      Result Value Range   Glucose-Capillary 145 (*) 70 - 99 mg/dL  GLUCOSE, CAPILLARY     Status: Abnormal   Collection Time    03/26/12 11:02 AM      Result Value Range   Glucose-Capillary 123 (*) 70 - 99 mg/dL  GLUCOSE, CAPILLARY     Status: Abnormal   Collection Time    03/27/12  8:33 AM      Result Value Range   Glucose-Capillary 120 (*) 70 - 99 mg/dL    Physical Findings: AIMS: Facial and Oral Movements Muscles of Facial Expression: None, normal Lips and Perioral Area: None, normal Jaw: None, normal Tongue: None, normal,Extremity Movements Upper (arms, wrists, hands, fingers): None, normal Lower (legs, knees, ankles, toes): None, normal, Trunk Movements Neck, shoulders, hips: None, normal, Overall Severity Severity of abnormal movements (highest score from questions above): None, normal Incapacitation due to abnormal movements: None, normal Patient's awareness of abnormal movements (rate only patient's report): No Awareness, Dental Status Current problems with teeth and/or dentures?: Yes Does patient usually wear dentures?: No  CIWA:    COWS:     Treatment Plan Summary: Daily contact with patient to assess and evaluate symptoms and progress in treatment Medication management  Plan: 1. Will continue patient on current medication regimen 2. Will ask male tech to assist patient with shower or bath today if possible. 3. Encourage patient to participates in unit activities and other milieu. 4. Continue the Haldol to 30mg  per day, ie 15mg  po BID  for delusions 5.Continue Trileptal 900mg  po BID for mood stabilization 5. Will get daily weights to monitor for decreased oral intake. 6. Continue one to one for fall precaution. 7. Will decrease the klonopin from TID to BID, and possibly to prn if needed. Medical Decision Making Problem Points:  Established problem, stable/improving (1), New problem, with additional work-up planned (4), Review of last therapy session (1) and Review of psycho-social stressors (1) Data Points:  Order Aims Assessment (2) Review of medication regiment & side effects (2) Review of new medications or change in dosage (2)  I certify that inpatient services furnished can reasonably be expected to improve the patient's condition.   Rona Ravens. Mashburn RPAC 1:42 PM 03/28/2012

## 2012-03-28 NOTE — Treatment Plan (Signed)
  Interdisciplinary Treatment Plan Update   Date Reviewed:  03/28/2012  Time Reviewed:  3:26 PM  Progress in Treatment:   Attending groups: No Participating in groups: No Taking medication as prescribed: Yes  Tolerating medication: Yes Family/Significant other contact made: Yes  Patient understands diagnosis: Yes  Discussing patient identified problems/goals with staff: Yes Medical problems stabilized or resolved: Yes Denies suicidal/homicidal ideation: Yes Patient has not harmed self or others: Yes  For review of initial/current patient goals, please see plan of care.  Estimated Length of Stay:  4-5 days  Reason for Continuation of Hospitalization: Medication stabilization Other; describe Disorganized thinking  New Problems/Goals identified:  N/A  Discharge Plan or Barriers:  Fred's sister visited on Friday.  We talked about Merlyn Albert going to an ALF from the hospital, at which point [facility] stated he would get on a plane and fly to another country before going to an ALF.  Additional Comments: Merlyn Albert continues to be irritable, confused and resistant to showering and dressing.  This may be his new baseline.  He is to be visited tomorrow by an ALF, and we will proceed as if he is going to one from here despite his his statements to the contrary.  CSW to continue coordination with family.  Attendees:  Signature: Thedore Mins, MD 03/28/2012 3:26 PM   Signature: Richelle Ito, LCSW 03/28/2012 3:26 PM  Signature: Verne Spurr, PA 03/28/2012 3:26 PM  Signature:  03/28/2012 3:26 PM  Signature: Joslyn Devon, RN 03/28/2012 3:26 PM  Signature:  03/28/2012 3:26 PM  Signature:   03/28/2012 3:26 PM  Signature:    Signature:    Signature:    Signature:    Signature:    Signature:      Scribe for Treatment Team:   Richelle Ito, LCSW  03/28/2012 3:26 PM

## 2012-03-28 NOTE — Progress Notes (Addendum)
0900    Patient had been sitting in chair in his room, then moved to his bed, and back to his chair to take his morning medications.  Patient talking very loud, difficult to understand.  Took medications one at a time with cup of water with each medication.  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  Stated he does not like to take medications.  Patient very unsteady when not sitting.   MHT's help patient transfer from bed to chair.  Patient wanted night light turned out, thought it was a "fire" in his room.  1:1 present at all times.  Patient became irritable/agitated when taking medications.  Respirations even and unlabored.  No signs/symptoms of pain/discomfort noted.  Patient remains safe and is presently resting in bed.  1:1 continues for safety.   A:  Medications administered per MD order.  Support and encouragement given throughout day.  1:1 time spent with patient. R: Refused treatment groups this morning.  1:1 continues for safety.  Safety maintained.   1200  Patient continues to rest in bed, occasionally opens eyes.  1:1 continues for safety.  No sign of pain or discomfort noted on patient's face or body movements.  Respirations even and unlabored.  1:1 continues for safety. 1300  Patient continues resting in bed with 1:1 present.  Has voided once this morning, 200 cc urine in urinal.  Refused lunch.  1:1 continues.  Will offer lunch again when patient wakes.  Respirations even and unlabored.  No signs/symptoms of pain/discomfort noted on patient's face or body movements.  1:1 continues for safety.

## 2012-03-28 NOTE — Clinical Social Work Note (Signed)
  Type of Therapy: Process Group Therapy  Participation Level:  Did Not Attend    Summary of Progress/Problems: Today's group addressed the issue of overcoming obstacles.  Patients were asked to identify their biggest obstacle post d/c that stands in the way of their on-going success, and then problem solve as to how to manage this.       David Lang 03/28/2012   3:24 PM

## 2012-03-28 NOTE — Progress Notes (Signed)
03/28/2012  0600   1:1 Constant Observation Nursing Note  Pt continues on 1:1 constant observation with sitter at bedside for safety/fall prevention.  Pt alternated between restlessness and sleep during the past hour.  In no apparent distress at this time.  Safety maintained. Dion Saucier RN

## 2012-03-28 NOTE — Progress Notes (Signed)
[facility] LCSW Aftercare Discharge Planning Group Note  03/28/2012 12:21 PM  Participation Quality:  Did not attend    Ida Rogue 03/28/2012, 12:21 PM

## 2012-03-28 NOTE — Progress Notes (Signed)
Recreation Therapy Notes   Date: 03.03.2014        Time: 9:30am Location: [address] Day Room      Group Topic/Focus: Wellness  Participation Level: Did not attend  [employer], LRT/CTRS    Jearl Klinefelter 03/28/2012 12:11 PM

## 2012-03-28 NOTE — Progress Notes (Addendum)
03/28/2012  0500   1:1 Constant Observation Nursing Note  Pt restless and loud for some time and eventually fell asleep; respirations noted, regular and unlabored.  Continues on 1:1 constant observation for safety/fall prevention with sitter at bedside.  Pt has been redirectable and cooperative during night hours when awake.  Voided in urinal at 0345.  Attempted to get out of bed several times but followed sitter instructions to remain in bed or allowed sitter to assist him back to comfortable position in bed.  In no apparent distress at this time.  Safety maintained. Dion Saucier RN

## 2012-03-29 LAB — GLUCOSE, CAPILLARY: Glucose-Capillary: 113 mg/dL — ABNORMAL HIGH (ref 70–99)

## 2012-03-29 MED ORDER — HALOPERIDOL 5 MG PO TABS
10.0000 mg | ORAL_TABLET | Freq: Two times a day (BID) | ORAL | Status: DC
  Administered 2012-03-29: 10 mg via ORAL
  Filled 2012-03-29 (×4): qty 2

## 2012-03-29 MED ORDER — TRIHEXYPHENIDYL HCL 5 MG PO TABS
5.0000 mg | ORAL_TABLET | Freq: Two times a day (BID) | ORAL | Status: DC
  Administered 2012-03-29: 5 mg via ORAL
  Filled 2012-03-29 (×6): qty 1

## 2012-03-29 MED ORDER — OXCARBAZEPINE 150 MG PO TABS
450.0000 mg | ORAL_TABLET | Freq: Two times a day (BID) | ORAL | Status: DC
  Administered 2012-03-29: 450 mg via ORAL
  Filled 2012-03-29 (×6): qty 3

## 2012-03-29 MED ORDER — CLONAZEPAM 0.5 MG PO TABS
0.2500 mg | ORAL_TABLET | Freq: Two times a day (BID) | ORAL | Status: DC
  Administered 2012-03-29: 0.25 mg via ORAL
  Filled 2012-03-29 (×2): qty 1

## 2012-03-29 NOTE — Progress Notes (Signed)
Pt remains on 1:1 for safety d/t a fall last week.  He still c/o acid reflux and meds are given as ordered.  He denies SI/HI/AV.  He seems more confused tonight than he was last week, but he is also calmer and cooperative with staff.  Support/encouragement given.  See 1:1 progress notes in the shadow chart.

## 2012-03-29 NOTE — Progress Notes (Signed)
Date: 03/29/2012  Time: 8:50 PM  Group Topic/Focus:  Wrap-Up Group: The focus of this group is to help patients review their daily goal of treatment and discuss progress on daily workbooks.  Participation Level: Active  Participation Quality: Appropriate, Sharing and Supportive  Affect: Appropriate  Cognitive: Appropriate  Insight: Appropriate  Engagement in Group: Engaged and Supportive  Modes of Intervention: Education and Support  Additional Comments: Pt stated that goals that were set was achieved.  Lang, David M  03/29/2012, 8:50 PM  

## 2012-03-29 NOTE — Clinical Social Work Note (Signed)
[facility] LCSW Group Therapy  03/29/2012 , 1:50 PM   Type of Therapy:  Group Therapy  Participation Level:  Did not attend    Summary of Progress/Problems: Today's group focused on the term Diagnosis.  Participants were asked to define the term, and then pronounce whether it is a negative, positive or neutral term.  Daryel Gerald B 03/29/2012 , 1:50 PM

## 2012-03-29 NOTE — Progress Notes (Signed)
[facility] LCSW Aftercare Discharge Planning Group Note  03/29/2012 10:30 AM  Participation Quality:  Did not attend    Ida Rogue 03/29/2012, 10:30 AM

## 2012-03-29 NOTE — Progress Notes (Signed)
Patient ID: David Lang, male   DOB: 30-Jul-1950, 62 y.o.   MRN: [id number] [facility] MD Progress Note  03/29/2012 11:00 AM David Lang  MRN:  [id number]  Subjective:Patient is interviewed laying in bed and states that he is not in the mood to talk to me today. However, he is observed to be less paranoid, labile, angry or agitated. He is on 1:1 observation for fall risk. Hi family member visited him few days ago and they are planning to place him in an assisted living facility.  Diagnosis:  Schizophrenia paranoid type                      Hypertension                      DM type 2  ADL's:  Impaired  Sleep: fair   Appetite:  Fair  Suicidal Ideation:  denies Homicidal Ideation:  denies AEB (as evidenced by):   Psychiatric Specialty Exam: Review of Systems  Unable to perform ROS Constitutional: Negative.   HENT: Negative.   Eyes: Negative.   Respiratory: Negative.   Cardiovascular: Negative.   Gastrointestinal: Negative.   Genitourinary: Negative.   Musculoskeletal: Negative.   Skin: Negative.   Neurological: Negative.   Endo/Heme/Allergies: Negative.   Psychiatric/Behavioral: Positive for hallucinations.       Agitated    Blood pressure 148/76, pulse 65, temperature 97.8 F (36.6 C), temperature source Oral, resp. rate 18, height 5' 9.5" (1.765 m), weight 80.74 kg (178 lb), SpO2 98.00%.Body mass index is 25.92 kg/(m^2).  General Appearance: fairly groomed  Patent attorney:: minimal  Speech:   loud  Volume:  Increased  Mood:  less Irritable  Affect:  Full range  Thought Process:  Disorganized  Orientation:  unable to assess.  Thought Content:  Delusions and Hallucinations: talking to self  Suicidal Thoughts:  No  Homicidal Thoughts:  No  Memory:  unable to assess.  Judgement:  Poor  Insight:  Lacking  Psychomotor Activity:  Increased  Concentration:  Poor  Recall:  Poor  Akathisia:  No  Handed:  Right  AIMS (if indicated):     Assets: minimal Sister is in [place]   Sleep:  Number of Hours: 5.25   Current Medications: Current Facility-Administered Medications  Medication Dose Route Frequency Provider Last Rate Last Dose  . acetaminophen (TYLENOL) tablet 650 mg  650 mg Oral Q6H PRN Shuvon Rankin, NP      . alum & mag hydroxide-simeth (MAALOX/MYLANTA) 200-200-20 MG/5ML suspension 30 mL  30 mL Oral Q4H PRN Shuvon Rankin, NP      . amLODipine (NORVASC) tablet 10 mg  10 mg Oral Daily Shuvon Rankin, NP   10 mg at 03/12/12 0818  . aspirin EC tablet 325 mg  325 mg Oral Daily Sanjuana Kava, NP   325 mg at 03/12/12 0810  . atenolol (TENORMIN) tablet 100 mg  100 mg Oral Daily Shuvon Rankin, NP   100 mg at 03/12/12 0826  . benztropine (COGENTIN) tablet 1 mg  1 mg Oral BID Verne Spurr, PA-C   1 mg at 03/12/12 1620  . calcium carbonate (TUMS - dosed in mg elemental calcium) chewable tablet 200 mg of elemental calcium  1 tablet Oral Q4H PRN Kerry Hough, PA      . diphenhydrAMINE (BENADRYL) capsule 50 mg  50 mg Oral Q6H PRN Verne Spurr, PA-C       Or  . diphenhydrAMINE (BENADRYL) injection 50 mg  50 mg Intramuscular Q6H PRN Verne Spurr, PA-C      . diphenhydrAMINE (BENADRYL) injection 50 mg  50 mg Intramuscular Once Mojeed Akintayo      . famotidine (PEPCID) tablet 20 mg  20 mg Oral Q12H PRN Kerry Hough, PA   20 mg at 03/09/12 2312  . haloperidol (HALDOL) tablet 15 mg  15 mg Oral BID Verne Spurr, PA-C   15 mg at 03/12/12 1620   Or  . haloperidol lactate (HALDOL) injection 15 mg  15 mg Intramuscular BID Verne Spurr, PA-C      . haloperidol (HALDOL) tablet 5 mg  5 mg Oral Q6H PRN Mojeed Akintayo      . hydrALAZINE (APRESOLINE) tablet 10 mg  10 mg Oral Q6H PRN Shuvon Rankin, NP   10 mg at 03/09/12 1038  . LORazepam (ATIVAN) tablet 2 mg  2 mg Oral Q6H PRN Mojeed Akintayo   2 mg at 03/12/12 0051  . magnesium hydroxide (MILK OF MAGNESIA) suspension 30 mL  30 mL Oral Daily PRN Shuvon Rankin, NP      . menthol-cetylpyridinium (CEPACOL) lozenge 3 mg  1  lozenge Oral PRN Shuvon Rankin, NP      . Oxcarbazepine (TRILEPTAL) tablet 300 mg  300 mg Oral BID Mojeed Akintayo   300 mg at 03/12/12 1611    Lab Results:  Results for orders placed during the hospital encounter of 03/04/12 (from the past 48 hour(s))  GLUCOSE, CAPILLARY     Status: None   Collection Time    03/27/12 11:43 AM      Result Value Range   Glucose-Capillary 99  70 - 99 mg/dL  GLUCOSE, CAPILLARY     Status: Abnormal   Collection Time    03/27/12  4:55 PM      Result Value Range   Glucose-Capillary 137 (*) 70 - 99 mg/dL  GLUCOSE, CAPILLARY     Status: Abnormal   Collection Time    03/28/12 11:40 AM      Result Value Range   Glucose-Capillary 121 (*) 70 - 99 mg/dL   Comment 1 Documented in Chart     Comment 2 Notify RN    GLUCOSE, CAPILLARY     Status: Abnormal   Collection Time    03/28/12  5:04 PM      Result Value Range   Glucose-Capillary 100 (*) 70 - 99 mg/dL   Comment 1 Documented in Chart     Comment 2 Notify RN      Physical Findings: AIMS: Facial and Oral Movements Muscles of Facial Expression: None, normal Lips and Perioral Area: None, normal Jaw: None, normal Tongue: None, normal,Extremity Movements Upper (arms, wrists, hands, fingers): None, normal Lower (legs, knees, ankles, toes): Minimal (weak, unable to stand by himself; 1:1 present), Trunk Movements Neck, shoulders, hips: None, normal, Overall Severity Severity of abnormal movements (highest score from questions above): None, normal Incapacitation due to abnormal movements: None, normal Patient's awareness of abnormal movements (rate only patient's report): Aware, no distress, Dental Status Current problems with teeth and/or dentures?: No Does patient usually wear dentures?: No  CIWA:  CIWA-Ar Total: 0 COWS:  COWS Total Score: 2  Treatment Plan Summary: Daily contact with patient to assess and evaluate symptoms and progress in treatment Medication management  Plan: 1. Will continue  patient on current medication regimen 2. Will ask male tech to assist patient with shower or bath today if possible. 3. Encourage patient to participates in unit activities and other milieu.  4. Decrease Haldol to 10mg  po BID for delusions 5. Decrease Trileptal to 450mg  po BID for mood stabilization 5. Continue 1:1 observation for safety. Medical Decision Making Problem Points:  Established problem, stable/improving (1), New problem, with additional work-up planned (4), Review of last therapy session (1) and Review of psycho-social stressors (1) Data Points:  Order Aims Assessment (2) Review of medication regiment & side effects (2) Review of new medications or change in dosage (2)  I certify that inpatient services furnished can reasonably be expected to improve the patient's condition.  Mojeed Akintayo,MD 03/29/2012, 11:00 AM

## 2012-03-29 NOTE — Progress Notes (Signed)
Observation note: Pt in bed awake with sitter at bedside. Pt denies pain and show no s/s of distress. Pt irritable. Pt continues to need redirecting by staff and minimal assist with ADLs. 1:1 observation for safety maintained until d/c'd. Pt is safe.

## 2012-03-29 NOTE — Progress Notes (Signed)
Observation note: Pt sitting in chair with sitter present. Pt continues to receive minimal assist with ADLs. Pt reported, "I will blow up [place]".  Pt continues to be irritable and labile. Pt continues to express feelings of not wanting to go to an assistant living facility.   1:1 observation maintained until d/c'd. Pt remains safe.

## 2012-03-29 NOTE — Progress Notes (Signed)
1:1 observation note: pt in bed sleeping with sitter at bedside. Pt resp are even and unlabored. Pt do not appear to be in distress.  Pt compliant with taking meds this morning. Pt need redirecting when taking meds. Pt presents with mild confusion,      Pt reoriented to place, time and situation. Pt presents with poor hygiene and body odor. Pt refuses to shower and refuses to change his clothing.  Pt needs minimal assist with ADLs. Pt presents with disorganized thoughts. Pt refused morning CBG and wt.  1:1 observation maintained until d/c'd. Pt reoriented by staff. Medications administered as ordered per MD. Verbal support given. Pt remains safe.

## 2012-03-30 ENCOUNTER — Encounter (HOSPITAL_COMMUNITY): Payer: Self-pay | Admitting: *Deleted

## 2012-03-30 ENCOUNTER — Inpatient Hospital Stay (HOSPITAL_COMMUNITY): Payer: No Typology Code available for payment source

## 2012-03-30 ENCOUNTER — Encounter (HOSPITAL_COMMUNITY): Payer: Self-pay | Admitting: Family Medicine

## 2012-03-30 ENCOUNTER — Encounter (HOSPITAL_COMMUNITY)
Admission: AD | Disposition: A | Discharge status: Other Acute Inpatient Hospital | Payer: Self-pay | Source: Intra-hospital | Attending: Psychiatry

## 2012-03-30 ENCOUNTER — Inpatient Hospital Stay (HOSPITAL_COMMUNITY)
Admission: AD | Admit: 2012-03-30 | Discharge: 2012-04-18 | DRG: 469 | Disposition: A | Discharge status: Skilled Nursing Facility | Payer: Medicaid Other | Source: Other Acute Inpatient Hospital | Attending: Internal Medicine | Admitting: Internal Medicine

## 2012-03-30 DIAGNOSIS — D649 Anemia, unspecified: Secondary | ICD-10-CM

## 2012-03-30 DIAGNOSIS — S72019A Unspecified intracapsular fracture of unspecified femur, initial encounter for closed fracture: Secondary | ICD-10-CM

## 2012-03-30 DIAGNOSIS — Z91199 Patient's noncompliance with other medical treatment and regimen due to unspecified reason: Secondary | ICD-10-CM

## 2012-03-30 DIAGNOSIS — S72009A Fracture of unspecified part of neck of unspecified femur, initial encounter for closed fracture: Secondary | ICD-10-CM

## 2012-03-30 DIAGNOSIS — E119 Type 2 diabetes mellitus without complications: Secondary | ICD-10-CM | POA: Diagnosis present

## 2012-03-30 DIAGNOSIS — E1169 Type 2 diabetes mellitus with other specified complication: Secondary | ICD-10-CM | POA: Diagnosis present

## 2012-03-30 DIAGNOSIS — F209 Schizophrenia, unspecified: Secondary | ICD-10-CM | POA: Diagnosis present

## 2012-03-30 DIAGNOSIS — F200 Paranoid schizophrenia: Secondary | ICD-10-CM

## 2012-03-30 DIAGNOSIS — W19XXXA Unspecified fall, initial encounter: Secondary | ICD-10-CM | POA: Diagnosis present

## 2012-03-30 DIAGNOSIS — D509 Iron deficiency anemia, unspecified: Secondary | ICD-10-CM | POA: Diagnosis present

## 2012-03-30 DIAGNOSIS — S72033A Displaced midcervical fracture of unspecified femur, initial encounter for closed fracture: Principal | ICD-10-CM | POA: Diagnosis present

## 2012-03-30 DIAGNOSIS — S0180XA Unspecified open wound of other part of head, initial encounter: Secondary | ICD-10-CM | POA: Diagnosis present

## 2012-03-30 DIAGNOSIS — D72829 Elevated white blood cell count, unspecified: Secondary | ICD-10-CM | POA: Diagnosis not present

## 2012-03-30 DIAGNOSIS — J189 Pneumonia, unspecified organism: Secondary | ICD-10-CM

## 2012-03-30 DIAGNOSIS — IMO0002 Reserved for concepts with insufficient information to code with codable children: Secondary | ICD-10-CM | POA: Diagnosis present

## 2012-03-30 DIAGNOSIS — E875 Hyperkalemia: Secondary | ICD-10-CM | POA: Diagnosis present

## 2012-03-30 DIAGNOSIS — M25559 Pain in unspecified hip: Secondary | ICD-10-CM

## 2012-03-30 DIAGNOSIS — I10 Essential (primary) hypertension: Secondary | ICD-10-CM

## 2012-03-30 DIAGNOSIS — I129 Hypertensive chronic kidney disease with stage 1 through stage 4 chronic kidney disease, or unspecified chronic kidney disease: Secondary | ICD-10-CM | POA: Diagnosis present

## 2012-03-30 DIAGNOSIS — N183 Chronic kidney disease, stage 3 unspecified: Secondary | ICD-10-CM | POA: Diagnosis present

## 2012-03-30 HISTORY — DX: Fracture of unspecified part of neck of unspecified femur, initial encounter for closed fracture: S72.009A

## 2012-03-30 LAB — GLUCOSE, CAPILLARY: Glucose-Capillary: 132 mg/dL — ABNORMAL HIGH (ref 70–99)

## 2012-03-30 SURGERY — HEMIARTHROPLASTY, HIP, DIRECT ANTERIOR APPROACH, FOR FRACTURE
Anesthesia: General | Laterality: Left

## 2012-03-30 MED ORDER — HALOPERIDOL 5 MG PO TABS
10.0000 mg | ORAL_TABLET | Freq: Two times a day (BID) | ORAL | Status: DC
  Administered 2012-03-30 – 2012-04-18 (×34): 10 mg via ORAL
  Filled 2012-03-30 (×50): qty 2

## 2012-03-30 MED ORDER — INSULIN ASPART 100 UNIT/ML ~~LOC~~ SOLN: 0.0000 [IU] | Freq: Every day | SUBCUTANEOUS | Status: DC

## 2012-03-30 MED ORDER — MORPHINE SULFATE 2 MG/ML IJ SOLN: 0.5000 mg | INTRAMUSCULAR | Status: DC | PRN

## 2012-03-30 MED ORDER — ACETAMINOPHEN 325 MG PO TABS: 650.0000 mg | ORAL_TABLET | Freq: Four times a day (QID) | ORAL | Status: DC | PRN

## 2012-03-30 MED ORDER — MORPHINE SULFATE 4 MG/ML IJ SOLN: 4.0000 mg | INTRAMUSCULAR | Status: DC | PRN

## 2012-03-30 MED ORDER — TRIHEXYPHENIDYL HCL 5 MG PO TABS
5.0000 mg | ORAL_TABLET | Freq: Two times a day (BID) | ORAL | Status: DC
  Administered 2012-03-30 – 2012-04-18 (×28): 5 mg via ORAL
  Filled 2012-03-30 (×41): qty 1

## 2012-03-30 MED ORDER — DOCUSATE SODIUM 100 MG PO CAPS
100.0000 mg | ORAL_CAPSULE | Freq: Two times a day (BID) | ORAL | Status: DC
  Administered 2012-03-31 – 2012-04-18 (×31): 100 mg via ORAL
  Filled 2012-03-30 (×39): qty 1

## 2012-03-30 MED ORDER — HYDROCODONE-ACETAMINOPHEN 5-325 MG PO TABS
1.0000 | ORAL_TABLET | Freq: Four times a day (QID) | ORAL | Status: DC | PRN
  Administered 2012-03-30 – 2012-03-31 (×3): 2 via ORAL
  Filled 2012-03-30 (×3): qty 2

## 2012-03-30 MED ORDER — CLONAZEPAM 0.5 MG PO TABS
0.2500 mg | ORAL_TABLET | Freq: Two times a day (BID) | ORAL | Status: DC
  Administered 2012-03-30: 22:00:00 via ORAL
  Administered 2012-03-31 – 2012-04-18 (×32): 0.25 mg via ORAL
  Filled 2012-03-30 (×11): qty 1
  Filled 2012-03-30: qty 2
  Filled 2012-03-30 (×23): qty 1

## 2012-03-30 MED ORDER — OXCARBAZEPINE 300 MG PO TABS
450.0000 mg | ORAL_TABLET | Freq: Two times a day (BID) | ORAL | Status: DC
  Administered 2012-03-30 – 2012-04-14 (×28): 450 mg via ORAL
  Administered 2012-04-14: 300 mg via ORAL
  Administered 2012-04-15 – 2012-04-18 (×7): 450 mg via ORAL
  Filled 2012-03-30 (×39): qty 1

## 2012-03-30 MED ORDER — ATENOLOL 100 MG PO TABS
100.0000 mg | ORAL_TABLET | Freq: Every day | ORAL | Status: DC
  Administered 2012-03-31 – 2012-04-18 (×19): 100 mg via ORAL
  Filled 2012-03-30 (×24): qty 1

## 2012-03-30 MED ORDER — INSULIN ASPART 100 UNIT/ML ~~LOC~~ SOLN
0.0000 [IU] | Freq: Three times a day (TID) | SUBCUTANEOUS | Status: DC
  Administered 2012-03-30 – 2012-03-31 (×2): 2 [IU] via SUBCUTANEOUS
  Administered 2012-04-01 (×2): 3 [IU] via SUBCUTANEOUS
  Administered 2012-04-02: 2 [IU] via SUBCUTANEOUS

## 2012-03-30 MED ORDER — HALOPERIDOL 5 MG PO TABS
5.0000 mg | ORAL_TABLET | Freq: Four times a day (QID) | ORAL | Status: DC | PRN
  Administered 2012-03-31 – 2012-04-04 (×4): 5 mg via ORAL
  Filled 2012-03-30 (×5): qty 1

## 2012-03-30 MED ORDER — LORAZEPAM 1 MG PO TABS
2.0000 mg | ORAL_TABLET | Freq: Four times a day (QID) | ORAL | Status: DC | PRN
  Administered 2012-03-30 – 2012-04-17 (×8): 2 mg via ORAL
  Filled 2012-03-30 (×9): qty 2

## 2012-03-30 MED ORDER — AMLODIPINE BESYLATE 10 MG PO TABS
10.0000 mg | ORAL_TABLET | Freq: Every day | ORAL | Status: DC
  Administered 2012-03-31 – 2012-04-02 (×3): 10 mg via ORAL
  Filled 2012-03-30 (×6): qty 1

## 2012-03-30 NOTE — H&P (Signed)
[facility] History and Physical  David Lang XBM:[id number] DOB: November 18, 1950 DOA: 03/04/2012  Referring physician: er PCP: Default, Provider, MD  Specialists: ortho  Chief Complaint: fall and hip pain  HPI: David Lang is a 62 y.o. male  Who was admitted at [facility] by the teaching service on 2/1 for AKI.  He was then transferred to [facility] for schizophrenia.  Per chart review: He was apparently being treated by his sister, David Lang, who is an MD- pediatrician who does not live in [place], with Haldol 10mg  qam and 20mg  qhs. Per nursing, in discussing the pt with his sister, she states that he was diagnosed with a mood disorder as a teenager in [place] and was hospitalized a few times there. He moved to the [place] in the 80s and was hospitalized at [facility] in 2010 and was placed on the Haldol at that time. Per pt's pharmacy (number provided by the sister), [name] has not had any medications filled since January, 2013.  Patient was transferred to the ER at [facility] after patient fell from his wheelchair at [facility].  Patient fell forward out of his wheelchair. He sustained a laceration to his left lateral eyebrow. Patient is complaining of left hip pain. Patient has history of schizophrenia, and is a poor historian. He reports he fell "due to the weapons of the mind".    In the ER, he was found to have a broken hip.  He was seen by Dr. [name] who states the patient needs a hemi hip.  Patient was deemed by psych not to have capacity to make medical decisions- he was initially against surgery but is now agreeable.  Family is also wanting surgery done.    Review of Systems: unable to do full ROS due to mental illness    Past Medical History  Diagnosis Date  . Hypertension   . Diabetes mellitus without complication   . Mental disorder   . Schizophrenia    History reviewed. No pertinent past surgical history. Social History:  reports that he has never smoked. He does not have any  smokeless tobacco history on file. He reports that he does not drink alcohol or use illicit drugs. From [facility] before this admission- lived home alone   No Known Allergies  Family Hx: +DM  Prior to Admission medications   Medication Sig Start Date End Date Taking? Authorizing Provider  aspirin 325 MG EC tablet Take 325 mg by mouth daily.   Yes Historical Provider, MD   Physical Exam: Filed Vitals:   03/29/12 1735 03/30/12 0300 03/30/12 0330 03/30/12 0926  BP: 150/82 139/73 173/88 173/82  Pulse: 61 68 67 104  Temp:   97.7 F (36.5 C) 98.7 F (37.1 C)  TempSrc:   Oral Oral  Resp:   16 22  Height:      SpO2:   93% 99%     General:  Paranoid, tangential thinking  Eyes: wnl- large laceration above left eye  ENT: wnl  Neck: no   Cardiovascular: rrr  Respiratory: clear, no wheezing  Abdomen: +BS,s oft, NT.ND  Skin: lesion as above, no rash  Musculoskeletal: moves all 4 extremitites  Psychiatric: tangential thinking,   Neurologic: no focal deficit  Labs on Admission:  Basic Metabolic Panel: No results found for this basename: NA, K, CL, CO2, GLUCOSE, BUN, CREATININE, CALCIUM, MG, PHOS,  in the last 168 hours Liver Function Tests: No results found for this basename: AST, ALT, ALKPHOS, BILITOT, PROT, ALBUMIN,  in the  last 168 hours No results found for this basename: LIPASE, AMYLASE,  in the last 168 hours No results found for this basename: AMMONIA,  in the last 168 hours CBC: No results found for this basename: WBC, NEUTROABS, HGB, HCT, MCV, PLT,  in the last 168 hours Cardiac Enzymes: No results found for this basename: CKTOTAL, CKMB, CKMBINDEX, TROPONINI,  in the last 168 hours  BNP (last 3 results) No results found for this basename: PROBNP,  in the last 8760 hours CBG:  Recent Labs Lab 03/27/12 1655 03/28/12 1140 03/28/12 1704 03/29/12 1721 03/30/12 0925  GLUCAP 137* 121* 100* 113* 150*    Radiological Exams on Admission: Dg Hip Complete  Left  03/30/2012  s*RADIOLOGY REPORT*  Clinical Data: Status post fall; left hip pain.  LEFT HIP - COMPLETE 2+ VIEW  Comparison: None.  Findings: There is a mildly displaced subcapital fracture involving the left femoral neck, with mild rotation of the femoral head.  The left femoral head remains seated at the acetabulum.  The right hip joint is unremarkable in appearance.  The sacroiliac joints are unremarkable in appearance.  The visualized bowel gas pattern is grossly unremarkable.  IMPRESSION: Mildly displaced subcapital fracture involving the left femoral neck.   Original Report Authenticated By: Tonia Ghent, M.D.    Ct Head Wo Contrast  03/30/2012  *RADIOLOGY REPORT*  Clinical Data:  Status post fall; laceration above the left eye. Concern for head or cervical spine injury.  CT HEAD WITHOUT CONTRAST AND CT CERVICAL SPINE WITHOUT CONTRAST  Technique:  Multidetector CT imaging of the head and cervical spine was performed following the standard protocol without intravenous contrast.  Multiplanar CT image reconstructions of the cervical spine were also generated.  Comparison: None  CT HEAD  Findings: There is no evidence of acute infarction, mass lesion, or intra- or extra-axial hemorrhage on CT.  Prominence of the sulci suggests mild cortical volume loss. Relatively diffuse periventricular and subcortical white matter change likely reflects small vessel ischemic microangiopathy. Cerebellar atrophy is noted.  A small chronic lacunar infarct is noted at the anterior right thalamus.  Chronic ischemic change is seen at the external capsule bilaterally.  The brainstem and fourth ventricle are within normal limits.  The cerebral hemispheres demonstrate grossly normal gray-white differentiation.  No mass effect or midline shift is seen.   There is no evidence of fracture; visualized osseous structures are unremarkable in appearance.  The orbits are within normal limits.  The paranasal sinuses and mastoid air cells  are well- aerated.  The known soft tissue laceration is not well characterized on CT.  IMPRESSION:  1.  No evidence of traumatic intracranial injury or fracture. 2.  Mild cortical volume loss and diffuse small vessel ischemic microangiopathy. 3.  Small chronic infarct at the anterior right thalamus, and chronic ischemic change at the external capsule bilaterally.  CT CERVICAL SPINE  Findings: There is no evidence of fracture or subluxation. Vertebral bodies demonstrate normal height and alignment. Intervertebral disc spaces are preserved.  Small anterior disc osteophyte complexes are noted along the cervical spine. Prevertebral soft tissues are within normal limits.  The visualized neural foramina are grossly unremarkable.  The thyroid gland is unremarkable in appearance.  Diffuse interstitial prominence is noted at the lung apices, with small bilateral pleural effusions, concerning for pulmonary edema.  Mild calcification is noted along the left vertebral artery at the level of C2.  IMPRESSION:  1.  No evidence of fracture or subluxation along the cervical spine. 2.  Diffuse interstitial prominence of the lung apices, with small bilateral pleural effusions, concerning for pulmonary edema.   Original Report Authenticated By: Tonia Ghent, M.D.    Ct Cervical Spine Wo Contrast  03/30/2012  *RADIOLOGY REPORT*  Clinical Data:  Status post fall; laceration above the left eye. Concern for head or cervical spine injury.  CT HEAD WITHOUT CONTRAST AND CT CERVICAL SPINE WITHOUT CONTRAST  Technique:  Multidetector CT imaging of the head and cervical spine was performed following the standard protocol without intravenous contrast.  Multiplanar CT image reconstructions of the cervical spine were also generated.  Comparison: None  CT HEAD  Findings: There is no evidence of acute infarction, mass lesion, or intra- or extra-axial hemorrhage on CT.  Prominence of the sulci suggests mild cortical volume loss. Relatively diffuse  periventricular and subcortical white matter change likely reflects small vessel ischemic microangiopathy. Cerebellar atrophy is noted.  A small chronic lacunar infarct is noted at the anterior right thalamus.  Chronic ischemic change is seen at the external capsule bilaterally.  The brainstem and fourth ventricle are within normal limits.  The cerebral hemispheres demonstrate grossly normal gray-white differentiation.  No mass effect or midline shift is seen.   There is no evidence of fracture; visualized osseous structures are unremarkable in appearance.  The orbits are within normal limits.  The paranasal sinuses and mastoid air cells are well- aerated.  The known soft tissue laceration is not well characterized on CT.  IMPRESSION:  1.  No evidence of traumatic intracranial injury or fracture. 2.  Mild cortical volume loss and diffuse small vessel ischemic microangiopathy. 3.  Small chronic infarct at the anterior right thalamus, and chronic ischemic change at the external capsule bilaterally.  CT CERVICAL SPINE  Findings: There is no evidence of fracture or subluxation. Vertebral bodies demonstrate normal height and alignment. Intervertebral disc spaces are preserved.  Small anterior disc osteophyte complexes are noted along the cervical spine. Prevertebral soft tissues are within normal limits.  The visualized neural foramina are grossly unremarkable.  The thyroid gland is unremarkable in appearance.  Diffuse interstitial prominence is noted at the lung apices, with small bilateral pleural effusions, concerning for pulmonary edema.  Mild calcification is noted along the left vertebral artery at the level of C2.  IMPRESSION:  1.  No evidence of fracture or subluxation along the cervical spine. 2.  Diffuse interstitial prominence of the lung apices, with small bilateral pleural effusions, concerning for pulmonary edema.   Original Report Authenticated By: Tonia Ghent, M.D.    Dg Chest Port 1 View  03/30/2012   *RADIOLOGY REPORT*  Clinical Data: Preoperative chest radiograph.  PORTABLE CHEST - 1 VIEW  Comparison: Chest radiograph performed 02/26/2012  Findings: The lungs are well-aerated.  Vascular congestion is noted.  Bilateral central and bibasilar airspace opacities may reflect mild pulmonary edema.  There is no pleural effusion or pneumothorax.  The cardiomediastinal silhouette is borderline normal in size.  No acute osseous abnormalities are seen.  IMPRESSION: Vascular congestion noted.  Bilateral central and bibasilar airspace opacities may reflect mild pulmonary edema.  This may be transient, due to the patient's recent fall.   Original Report Authenticated By: Tonia Ghent, M.D.       Assessment/Plan Principal Problem:   Schizophrenia, paranoid type Active Problems:   Non compliance w medication regimen   Incontinence   Type II or unspecified type diabetes mellitus without mention of complication, uncontrolled   1. Hip fracture: L femoral neck- ortho has seen and  plans for surgery later this PM, patient does not have capacity and family wishes to procede with surgery, will need PT/OT consult- ? Back to [facility] vs SNF 2. Schizophrenia- continue medications at [facility] And ask psych to follow in hospital, sitter for now 3. DM- SSI  psych  Code Status: full Family Communication: patient at bedside Disposition Plan: ?  Time spent: 75  Marlin Canary [facility] Pager [phone]  If 7PM-7AM, please contact night-coverage [email or web address] Password [facility] 03/30/2012, 10:41 AM

## 2012-03-30 NOTE — ED Notes (Addendum)
Transported to xray--Kraven Calk Link Snuffer, MSN RN

## 2012-03-30 NOTE — ED Notes (Signed)
Attempt number 2 at calling report, nurse states he is busy and I will call back in 10 mins.

## 2012-03-30 NOTE — Discharge Summary (Signed)
**Note De-Identified Glenda Spelman Obfuscation** Physician Discharge Summary Note  Patient:  David Lang is an 62 y.o., male MRN:  [id number] DOB:  [birth year] Patient phone:  [phone] (home)  Patient address:   [address] [place] [address],   Date of Admission:  03/04/2012 Date of Discharge: 03/30/2012  Reason for Admission:  Psychosis  Discharge Diagnoses: ROS Axis Diagnosis:   AXIS I:  Schizophrenia paranoid type, with exacerbation, Medical non-compliance AXIS II:  Deferred AXIS III:   Past Medical History  Diagnosis Date  . Hypertension   . Diabetes mellitus without complication   . Mental disorder   . Schizophrenia    AXIS IV:  housing problems, other psychosocial or environmental problems, problems related to social environment, problems with access to health care services and problems with primary support group AXIS V:  31-40 impairment in reality testing  Level of Care:  [facility] Course:  Merlyn Albert was admitted to [facility] after several days in the medical unit following his admission for AKI due to being malnourished, psychotic, and non-compliant with his medication. During his initial medical stay he refused labs and continued to be loud and uncooperative.  After being transferred to [facility] for further stabilization and crisis management he continued to refuse medications and remained uncooperative.       A second medical opinion was completed and a force medication order was put in place.  Medications included Haldol titrated upward to 15mg  IM BID which is what his sister, an MD in [place] reported was his previous dosage.  He was also placed on Tegratol for mood stabilization and titrated to 600mg  po BID.  Fred responded well to the medication and became more coherent and able to respond appropriately. With the forced medication orders labs were obtained and he continued to do well.      His family was consulted and encouraged to get HCPOA or Guardianship.  The CM was in contact with the family regarding Fred's  options upon discharge.      Fred fell while on the unit and was transferred immediately to [facility] for further evaluation and stabilization.  Consults:  orthopedic surgery  Significant Diagnostic Studies:  None  Discharge Vitals:   Blood pressure 173/82, pulse 104, temperature 98.7 F (37.1 C), temperature source Oral, resp. rate 22, height 5' 9.5" (1.765 m), weight 80.74 kg (178 lb), SpO2 99.00%. Body mass index is 25.92 kg/(m^2). Lab Results:   Results for orders placed during the hospital encounter of 03/04/12 (from the past 72 hour(s))  GLUCOSE, CAPILLARY     Status: None   Collection Time    03/27/12 11:43 AM      Result Value Range   Glucose-Capillary 99  70 - 99 mg/dL  GLUCOSE, CAPILLARY     Status: Abnormal   Collection Time    03/27/12  4:55 PM      Result Value Range   Glucose-Capillary 137 (*) 70 - 99 mg/dL  GLUCOSE, CAPILLARY     Status: Abnormal   Collection Time    03/28/12 11:40 AM      Result Value Range   Glucose-Capillary 121 (*) 70 - 99 mg/dL   Comment 1 Documented in Chart     Comment 2 Notify RN    GLUCOSE, CAPILLARY     Status: Abnormal   Collection Time    03/28/12  5:04 PM      Result Value Range   Glucose-Capillary 100 (*) 70 - 99 mg/dL   Comment 1 Documented in **Note De-Identified  Obfuscation** Chart     Comment 2 Notify RN    GLUCOSE, CAPILLARY     Status: Abnormal   Collection Time    03/29/12  5:21 PM      Result Value Range   Glucose-Capillary 113 (*) 70 - 99 mg/dL  GLUCOSE, CAPILLARY     Status: Abnormal   Collection Time    03/30/12  9:25 AM      Result Value Range   Glucose-Capillary 150 (*) 70 - 99 mg/dL    Physical Findings: AIMS: Facial and Oral Movements Muscles of Facial Expression: None, normal Lips and Perioral Area: None, normal Jaw: None, normal Tongue: None, normal,Extremity Movements Upper (arms, wrists, hands, fingers): None, normal Lower (legs, knees, ankles, toes): Minimal (weak, unable to stand by himself; 1:1 present), Trunk  Movements Neck, shoulders, hips: None, normal, Overall Severity Severity of abnormal movements (highest score from questions above): None, normal Incapacitation due to abnormal movements: None, normal Patient's awareness of abnormal movements (rate only patient's report): Aware, no distress, Dental Status Current problems with teeth and/or dentures?: No Does patient usually wear dentures?: No  CIWA:  CIWA-Ar Total: 0 COWS:  COWS Total Score: 2  Psychiatric Specialty Exam: See Psychiatric Specialty Exam and Suicide Risk Assessment completed by Attending Physician prior to discharge.  Discharge destination:  Other:  Patient was transfered to the medical unit for care prior to pending orthopedic surgery  Is patient on multiple antipsychotic therapies at discharge:  No   Has Patient had three or more failed trials of antipsychotic monotherapy by history:  No  Recommended Plan for Multiple Antipsychotic Therapies: Not applicable     Medication List    ASK your doctor about these medications     Indication   aspirin 325 MG EC tablet  Take 325 mg by mouth daily.          Follow-up recommendations:  Activities: Resume activity as tolerated. Diet: Heart healthy low sodium diet Tests: Follow up testing will be determined by your out patient provider. Comments:    Total Discharge Time:  Greater than 30 minutes.  Signed: MASHBURN,NEIL 03/30/2012, 10:22 AM

## 2012-03-30 NOTE — ED Provider Notes (Addendum)
  Physical Exam  BP 173/82  Pulse 64  Temp(Src) 99 F (37.2 C) (Oral)  Resp 20  Ht 5' 9.5" (1.765 m)  Wt 178 lb (80.74 kg)  BMI 25.92 kg/m2  SpO2 99%  Physical Exam  ED Course  Procedures  MDM 0750:  Patient evaluated, patient states that he was told by the other doctor that he had a broken hip, he said that he saw x-rays, he also continues to talk about the [employer] of the [employer] and how he doesn't have enough weapons to take care of things. The patient is unable to explain what happened if he went without hip fixation, he continues to refuses saying I do not request surgery at this time, he is able to follow commands, he does not appear to be in respiratory distress, vital signs fairly unremarkable. Orthopedics is aware, Dr. [name] will see this AM, Psychiatry Dr. Baron Sane is coming to see pt to eval for competency, and Risk Management has been consulted to return call soon.  9:10 AM:  The patient has been evaluated by the psychiatrist Dr. Baron Sane who initially saw the patient on their presentation to behavioral health in the hospital. She believes that he still does not have the capacity to make his own decisions. I have spoken with risk management, Misty Stanley has recommended after discussing this care with the legal team including Beth Cox that at this point despite the family not having guardianship over the patient that they need to help make that decision.  9:15 AM:  Care was discussed with the sister [name] who lives in [place]. She states that she wants [place] to go forward with a surgical procedure if it is medically appropriate.  Daughter in [place] agrees as well  0930:  Discussed with Dr. Benjamine Mola who will admit - holding orders written.    Vida Roller, MD 03/30/12 [phone]  Donovan Kail from [facility] - [phone]   Dr. Karena Addison - sister in [place] [phone]  All medical questions should be directed towards the patient's sister in [place] specialist who is a physician. Either sister  can answer any question as needed.  Vida Roller, MD 03/30/12 [phone]

## 2012-03-30 NOTE — ED Notes (Signed)
Patient c/o left hip pain. Informed patient that an IV needed to be started to administer the pain medication that was ordered. Patient declined to have IV access initiated. States that he is used to pills. Informed patient that due to his broken hip he was currently NPO, and that meant that he could not eat or drink anything.

## 2012-03-30 NOTE — ED Notes (Signed)
Attempted to call report for pt to 1520, no answer, will call back in 10 mins.

## 2012-03-30 NOTE — Progress Notes (Signed)
Late entry note:  It was noted that while on 1:1 this evening around 2030, pt was unable to bear his weight when trying to transfer from a chair in the dayroom to his wheelchair.  The sitter had to call for assistance to get the pt into the wheelchair.  Later when pt was ready to go to bed, this writer had to assist the sitter to get pt into the bed because he could not bear his weight.  Pt repeatedly said he was not in any pain.  Between the time of going to bed and pt's fall, pt ambulated to the bathroom with minimal help from the sitter.  Then around 0300, the pt fell.  Pt was put on the 1:1 on 3/2 because he had been found in the floor, but could not get up.  He had told staff that he did not fall when he was found on the floor.

## 2012-03-30 NOTE — ED Notes (Signed)
Dr. Julian Reil at bedside discussing plan for admission with patient. Patient states that he does not want to be admitted to the hospital and that "I want to go home." Dr. Julian Reil explained to patient the need for hospital admission due to sustaining a hip fracture when he fell from wheelchair. Patient continues to refuse admission to hospital.

## 2012-03-30 NOTE — ED Provider Notes (Signed)
History     CSN: [id number]  Arrival date & time 03/30/12  0318   First MD Initiated Contact with Patient 03/30/12 [phone]      Chief Complaint  Patient presents with  . Fall  . Facial Laceration    (Consider location/radiation/quality/duration/timing/severity/associated sxs/prior treatment) HPI 62 year old male [facility] with [facility]. Patient fell forward out of his wheelchair. He sustained a laceration to his left lateral eyebrow. Patient is complaining of left hip pain. Patient has history of schizophrenia, and is a poor historian. He reports he fell "due to the weapons of the mind" Past Medical History  Diagnosis Date  . Hypertension   . Diabetes mellitus without complication   . Mental disorder   . Schizophrenia     History reviewed. No pertinent past surgical history.  History reviewed. No pertinent family history.  History  Substance Use Topics  . Smoking status: Never Smoker   . Smokeless tobacco: Not on file  . Alcohol Use: No     Comment: not answering      Review of Systems  Unable to perform ROS: Psychiatric disorder    Allergies  Review of patient's allergies indicates no known allergies.  Home Medications   Current Outpatient Rx  Name  Route  Sig  Dispense  Refill  . aspirin 325 MG EC tablet   Oral   Take 325 mg by mouth daily.           BP 173/88  Pulse 67  Temp(Src) 97.7 F (36.5 C) (Oral)  Resp 16  Ht 5' 9.5" (1.765 m)  Wt 178 lb (80.74 kg)  BMI 25.92 kg/m2  SpO2 93%  Physical Exam  Nursing note and vitals reviewed. Constitutional: He appears well-developed and well-nourished.  Disheveled  HENT:  Head: Normocephalic.  Right Ear: External ear normal.  Left Ear: External ear normal.  Nose: Nose normal.  Mouth/Throat: Oropharynx is clear and moist.  2 cm laceration to lateral eyebrow  Eyes: Conjunctivae and EOM are normal. Pupils are equal, round, and reactive to light.  Neck: Normal range  of motion. Neck supple. No JVD present. No tracheal deviation present. No thyromegaly present.  Cardiovascular: Normal rate, regular rhythm, normal heart sounds and intact distal pulses.  Exam reveals no gallop and no friction rub.   No murmur heard. Pulmonary/Chest: Effort normal and breath sounds normal. No stridor. No respiratory distress. He has no wheezes. He has no rales. He exhibits no tenderness.  Abdominal: Soft. Bowel sounds are normal. He exhibits no distension and no mass. There is no tenderness. There is no rebound and no guarding.  Musculoskeletal: He exhibits edema.  Lymphadenopathy:    He has no cervical adenopathy.  Neurological: He is alert.  Skin: Skin is warm and dry. No rash noted. No erythema. No pallor.    ED Course  Procedures (including critical care time)  Labs Reviewed  GLUCOSE, CAPILLARY - Abnormal; Notable for the following:    Glucose-Capillary 142 (*)    All other components within normal limits  COMPREHENSIVE METABOLIC PANEL - Abnormal; Notable for the following:    Sodium 132 (*)    Glucose, Bld 143 (*)    Albumin 3.1 (*)    GFR calc non Af Amer 55 (*)    GFR calc Af Amer 64 (*)    All other components within normal limits  CBC WITH DIFFERENTIAL - Abnormal; Notable for the following:    WBC 11.2 (*)    RBC 4.16 (*)  Hemoglobin 12.1 (*)    HCT 35.9 (*)    Platelets 404 (*)    All other components within normal limits  HEMOGLOBIN A1C - Abnormal; Notable for the following:    Hemoglobin A1C 8.8 (*)    Mean Plasma Glucose 206 (*)    All other components within normal limits  LIPID PANEL - Abnormal; Notable for the following:    Triglycerides 271 (*)    HDL 28 (*)    VLDL 54 (*)    LDL Cholesterol 117 (*)    All other components within normal limits  T3, FREE - Abnormal; Notable for the following:    T3, Free 2.2 (*)    All other components within normal limits  URINALYSIS, ROUTINE W REFLEX MICROSCOPIC - Abnormal; Notable for the following:     Glucose, UA 100 (*)    Protein, ur 100 (*)    All other components within normal limits  GLUCOSE, CAPILLARY - Abnormal; Notable for the following:    Glucose-Capillary 116 (*)    All other components within normal limits  GLUCOSE, CAPILLARY - Abnormal; Notable for the following:    Glucose-Capillary 145 (*)    All other components within normal limits  GLUCOSE, CAPILLARY - Abnormal; Notable for the following:    Glucose-Capillary 123 (*)    All other components within normal limits  GLUCOSE, CAPILLARY - Abnormal; Notable for the following:    Glucose-Capillary 120 (*)    All other components within normal limits  GLUCOSE, CAPILLARY - Abnormal; Notable for the following:    Glucose-Capillary 137 (*)    All other components within normal limits  GLUCOSE, CAPILLARY - Abnormal; Notable for the following:    Glucose-Capillary 121 (*)    All other components within normal limits  GLUCOSE, CAPILLARY - Abnormal; Notable for the following:    Glucose-Capillary 100 (*)    All other components within normal limits  GLUCOSE, CAPILLARY - Abnormal; Notable for the following:    Glucose-Capillary 113 (*)    All other components within normal limits  GC/CHLAMYDIA PROBE AMP  TSH  T4, FREE  RPR  HEPATITIS PANEL, ACUTE  HIV ANTIBODY (ROUTINE TESTING)  FOLATE  VITAMIN B12  MAGNESIUM  PHOSPHORUS  PROTIME-INR  URINE MICROSCOPIC-ADD ON  GLUCOSE, CAPILLARY   Dg Hip Complete Left  03/30/2012  s*RADIOLOGY REPORT*  Clinical Data: Status post fall; left hip pain.  LEFT HIP - COMPLETE 2+ VIEW  Comparison: None.  Findings: There is a mildly displaced subcapital fracture involving the left femoral neck, with mild rotation of the femoral head.  The left femoral head remains seated at the acetabulum.  The right hip joint is unremarkable in appearance.  The sacroiliac joints are unremarkable in appearance.  The visualized bowel gas pattern is grossly unremarkable.  IMPRESSION: Mildly displaced subcapital  fracture involving the left femoral neck.   Original Report Authenticated By: Tonia Ghent, M.D.    Ct Head Wo Contrast  03/30/2012  *RADIOLOGY REPORT*  Clinical Data:  Status post fall; laceration above the left eye. Concern for head or cervical spine injury.  CT HEAD WITHOUT CONTRAST AND CT CERVICAL SPINE WITHOUT CONTRAST  Technique:  Multidetector CT imaging of the head and cervical spine was performed following the standard protocol without intravenous contrast.  Multiplanar CT image reconstructions of the cervical spine were also generated.  Comparison: None  CT HEAD  Findings: There is no evidence of acute infarction, mass lesion, or intra- or extra-axial hemorrhage on CT.  Prominence  of the sulci suggests mild cortical volume loss. Relatively diffuse periventricular and subcortical white matter change likely reflects small vessel ischemic microangiopathy. Cerebellar atrophy is noted.  A small chronic lacunar infarct is noted at the anterior right thalamus.  Chronic ischemic change is seen at the external capsule bilaterally.  The brainstem and fourth ventricle are within normal limits.  The cerebral hemispheres demonstrate grossly normal gray-white differentiation.  No mass effect or midline shift is seen.   There is no evidence of fracture; visualized osseous structures are unremarkable in appearance.  The orbits are within normal limits.  The paranasal sinuses and mastoid air cells are well- aerated.  The known soft tissue laceration is not well characterized on CT.  IMPRESSION:  1.  No evidence of traumatic intracranial injury or fracture. 2.  Mild cortical volume loss and diffuse small vessel ischemic microangiopathy. 3.  Small chronic infarct at the anterior right thalamus, and chronic ischemic change at the external capsule bilaterally.  CT CERVICAL SPINE  Findings: There is no evidence of fracture or subluxation. Vertebral bodies demonstrate normal height and alignment. Intervertebral disc spaces  are preserved.  Small anterior disc osteophyte complexes are noted along the cervical spine. Prevertebral soft tissues are within normal limits.  The visualized neural foramina are grossly unremarkable.  The thyroid gland is unremarkable in appearance.  Diffuse interstitial prominence is noted at the lung apices, with small bilateral pleural effusions, concerning for pulmonary edema.  Mild calcification is noted along the left vertebral artery at the level of C2.  IMPRESSION:  1.  No evidence of fracture or subluxation along the cervical spine. 2.  Diffuse interstitial prominence of the lung apices, with small bilateral pleural effusions, concerning for pulmonary edema.   Original Report Authenticated By: Tonia Ghent, M.D.    Ct Cervical Spine Wo Contrast  03/30/2012  *RADIOLOGY REPORT*  Clinical Data:  Status post fall; laceration above the left eye. Concern for head or cervical spine injury.  CT HEAD WITHOUT CONTRAST AND CT CERVICAL SPINE WITHOUT CONTRAST  Technique:  Multidetector CT imaging of the head and cervical spine was performed following the standard protocol without intravenous contrast.  Multiplanar CT image reconstructions of the cervical spine were also generated.  Comparison: None  CT HEAD  Findings: There is no evidence of acute infarction, mass lesion, or intra- or extra-axial hemorrhage on CT.  Prominence of the sulci suggests mild cortical volume loss. Relatively diffuse periventricular and subcortical white matter change likely reflects small vessel ischemic microangiopathy. Cerebellar atrophy is noted.  A small chronic lacunar infarct is noted at the anterior right thalamus.  Chronic ischemic change is seen at the external capsule bilaterally.  The brainstem and fourth ventricle are within normal limits.  The cerebral hemispheres demonstrate grossly normal gray-white differentiation.  No mass effect or midline shift is seen.   There is no evidence of fracture; visualized osseous structures  are unremarkable in appearance.  The orbits are within normal limits.  The paranasal sinuses and mastoid air cells are well- aerated.  The known soft tissue laceration is not well characterized on CT.  IMPRESSION:  1.  No evidence of traumatic intracranial injury or fracture. 2.  Mild cortical volume loss and diffuse small vessel ischemic microangiopathy. 3.  Small chronic infarct at the anterior right thalamus, and chronic ischemic change at the external capsule bilaterally.  CT CERVICAL SPINE  Findings: There is no evidence of fracture or subluxation. Vertebral bodies demonstrate normal height and alignment. Intervertebral disc spaces are  preserved.  Small anterior disc osteophyte complexes are noted along the cervical spine. Prevertebral soft tissues are within normal limits.  The visualized neural foramina are grossly unremarkable.  The thyroid gland is unremarkable in appearance.  Diffuse interstitial prominence is noted at the lung apices, with small bilateral pleural effusions, concerning for pulmonary edema.  Mild calcification is noted along the left vertebral artery at the level of C2.  IMPRESSION:  1.  No evidence of fracture or subluxation along the cervical spine. 2.  Diffuse interstitial prominence of the lung apices, with small bilateral pleural effusions, concerning for pulmonary edema.   Original Report Authenticated By: Tonia Ghent, M.D.    Dg Chest Port 1 View  03/30/2012  *RADIOLOGY REPORT*  Clinical Data: Preoperative chest radiograph.  PORTABLE CHEST - 1 VIEW  Comparison: Chest radiograph performed 02/26/2012  Findings: The lungs are well-aerated.  Vascular congestion is noted.  Bilateral central and bibasilar airspace opacities may reflect mild pulmonary edema.  There is no pleural effusion or pneumothorax.  The cardiomediastinal silhouette is borderline normal in size.  No acute osseous abnormalities are seen.  IMPRESSION: Vascular congestion noted.  Bilateral central and bibasilar  airspace opacities may reflect mild pulmonary edema.  This may be transient, due to the patient's recent fall.   Original Report Authenticated By: Tonia Ghent, M.D.    LACERATION REPAIR Performed by: Olivia Mackie Authorized by: Olivia Mackie Consent: Verbal consent obtained. Risks and benefits: risks, benefits and alternatives were discussed Consent given by: patient Patient identity confirmed: provided demographic data Prepped and Draped in normal sterile fashion Wound explored  Laceration Location: left eyebrow  Laceration Length: 2cm  No Foreign Bodies seen or palpated  Anesthesia: none   Irrigation method: syringe Amount of cleaning: standard  Skin closure: dermabond  Number of sutures: none  Technique: dermabond  Patient tolerance: Patient tolerated the procedure well with no immediate complications.  Pt did wipe the area after the glue had dried slightly, but no disruption in the wound approximation.    1. Schizophrenia, paranoid type   2. Schizophrenia   3. Diabetes mellitus, type 2   4. Hypertension   5. Non compliance w medication regimen   6. Diabetes mellitus without complication   7. Incontinence   8. AKI (acute kidney injury)   9. Hip fracture requiring operative repair, left, closed, initial encounter       MDM  62 year old male status post fall with facial laceration. We'll check CT of head and C-spine for serious injuries, but based on exam I feel this is less likely. Patient is complaining of left hip pain, we'll check x-ray of same. Expect patient will be able to return back to behavioral Hospital after completion of radiology studies. Laceration appears amenable to Dermabond.      6:44 AM Pt is refusing treatment for his hip fracture.  He is currently on IVC paperwork due to his schizophrenia, but per [facility], has been refusing medications and is considered competent?  Discussed with patient's sister, [name] [phone], who wishes  patient to be treated for his hip fracture.  They have been working on guardianship for the patient, but have been unable to thus far.  Will have legal and psychiatry help with disposition.  7:39 AM Care passed to Dr Hyacinth Meeker.  He has spoken with psych PA, to have formal psychiatry consult in the ED as well as discussion with risk management.  Olivia Mackie, MD 03/30/12 [phone]

## 2012-03-30 NOTE — H&P (Signed)
[facility]  History and Physical  David Lang OZH:[id number] DOB: March 06, 1950 DOA: 03/04/2012  Referring physician: er  PCP: David Weide, MD  Specialists: ortho  Chief Complaint: fall and hip pain  HPI: David Lang is a 62 y.o. male  Who was admitted at [facility] by the teaching service on 2/1 for AKI. He was then transferred to [facility] for schizophrenia. Per chart review: He was apparently being treated by his sister, David Lang, who is an MD- pediatrician who does not live in [place], with Haldol 10mg  qam and 20mg  qhs. Per nursing, in discussing the pt with his sister, she states that he was diagnosed with a mood disorder as a teenager in [place] and was hospitalized a few times there. He moved to the [place] in the 80s and was hospitalized at [facility] in 2010 and was placed on the Haldol at that time. Per pt's pharmacy (number provided by the sister), David Lang has not had any medications filled since January, 2013.  Patient was transferred to the ER at [facility] after patient fell from his wheelchair at [facility]. Patient fell forward out of his wheelchair. He sustained a laceration to his left lateral eyebrow. Patient is complaining of left hip pain. Patient has history of schizophrenia, and is a poor historian. He reports he fell "due to the weapons of the mind".  In the ER, he was found to have a broken hip. He was seen by David Lang who states the patient needs a hemi hip. Patient was deemed by psych not to have capacity to make medical decisions- he was initially against surgery but is now agreeable. Family is also wanting surgery done.  Review of Systems: unable to do full ROS due to mental illness  Past Medical History   Diagnosis  Date   .  Hypertension    .  Diabetes mellitus without complication    .  Mental disorder    .  Schizophrenia     History reviewed. No pertinent past surgical history.  Social History: reports that he has never smoked. He does not have any smokeless tobacco  history on file. He reports that he does not drink alcohol or use illicit drugs.  From [facility] before this admission- lived home alone  No Known Allergies  Family Hx: +DM  Prior to Admission medications   Medication  Sig  Start Date  End Date  Taking?  Authorizing David Lang   aspirin 325 MG EC tablet  Take 325 mg by mouth daily.    Yes  Historical David Tory, MD   Physical Exam:  Filed Vitals:    03/29/12 1735  03/30/12 0300  03/30/12 0330  03/30/12 0926   BP:  150/82  139/73  173/88  173/82   Pulse:  61  68  67  104   Temp:    97.7 F (36.5 C)  98.7 F (37.1 C)   TempSrc:    Oral  Oral   Resp:    16  22   Height:       SpO2:    93%  99%    General: Paranoid, tangential thinking  Eyes: wnl- large laceration above left eye  ENT: wnl  Neck: no  Cardiovascular: rrr  Respiratory: clear, no wheezing  Abdomen: +BS,s oft, NT.ND  Skin: lesion as above, no rash  Musculoskeletal: moves all 4 extremitites  Psychiatric: tangential thinking,  Neurologic: no focal deficit Labs on Admission:  Basic Metabolic Panel:  No results found for this basename:  NA, K, CL, CO2, GLUCOSE, BUN, CREATININE, CALCIUM, MG, PHOS, in the last 168 hours  Liver Function Tests:  No results found for this basename: AST, ALT, ALKPHOS, BILITOT, PROT, ALBUMIN, in the last 168 hours  No results found for this basename: LIPASE, AMYLASE, in the last 168 hours  No results found for this basename: AMMONIA, in the last 168 hours  CBC:  No results found for this basename: WBC, NEUTROABS, HGB, HCT, MCV, PLT, in the last 168 hours  Cardiac Enzymes:  No results found for this basename: CKTOTAL, CKMB, CKMBINDEX, TROPONINI, in the last 168 hours  BNP (last 3 results)  No results found for this basename: PROBNP, in the last 8760 hours  CBG:   Recent Labs  Lab  03/27/12 1655  03/28/12 1140  03/28/12 1704  03/29/12 1721  03/30/12 0925   GLUCAP  137*  121*  100*  113*  150*    Radiological Exams on Admission:  Dg Hip  Complete Left  03/30/2012 s*RADIOLOGY REPORT* Clinical Data: Status post fall; left hip pain. LEFT HIP - COMPLETE 2+ VIEW Comparison: None. Findings: There is a mildly displaced subcapital fracture involving the left femoral neck, with mild rotation of the femoral head. The left femoral head remains seated at the acetabulum. The right hip joint is unremarkable in appearance. The sacroiliac joints are unremarkable in appearance. The visualized bowel gas pattern is grossly unremarkable. IMPRESSION: Mildly displaced subcapital fracture involving the left femoral neck. Original Report Authenticated By: Tonia Ghent, M.D.  Ct Head Wo Contrast  03/30/2012 *RADIOLOGY REPORT* Clinical Data: Status post fall; laceration above the left eye. Concern for head or cervical spine injury. CT HEAD WITHOUT CONTRAST AND CT CERVICAL SPINE WITHOUT CONTRAST Technique: Multidetector CT imaging of the head and cervical spine was performed following the standard protocol without intravenous contrast. Multiplanar CT image reconstructions of the cervical spine were also generated. Comparison: None CT HEAD Findings: There is no evidence of acute infarction, mass lesion, or intra- or extra-axial hemorrhage on CT. Prominence of the sulci suggests mild cortical volume loss. Relatively diffuse periventricular and subcortical white matter change likely reflects small vessel ischemic microangiopathy. Cerebellar atrophy is noted. A small chronic lacunar infarct is noted at the anterior right thalamus. Chronic ischemic change is seen at the external capsule bilaterally. The brainstem and fourth ventricle are within normal limits. The cerebral hemispheres demonstrate grossly normal gray-white differentiation. No mass effect or midline shift is seen. There is no evidence of fracture; visualized osseous structures are unremarkable in appearance. The orbits are within normal limits. The paranasal sinuses and mastoid air cells are well- aerated. The known  soft tissue laceration is not well characterized on CT. IMPRESSION: 1. No evidence of traumatic intracranial injury or fracture. 2. Mild cortical volume loss and diffuse small vessel ischemic microangiopathy. 3. Small chronic infarct at the anterior right thalamus, and chronic ischemic change at the external capsule bilaterally. CT CERVICAL SPINE Findings: There is no evidence of fracture or subluxation. Vertebral bodies demonstrate normal height and alignment. Intervertebral disc spaces are preserved. Small anterior disc osteophyte complexes are noted along the cervical spine. Prevertebral soft tissues are within normal limits. The visualized neural foramina are grossly unremarkable. The thyroid gland is unremarkable in appearance. Diffuse interstitial prominence is noted at the lung apices, with small bilateral pleural effusions, concerning for pulmonary edema. Mild calcification is noted along the left vertebral artery at the level of C2. IMPRESSION: 1. No evidence of fracture or subluxation along the cervical spine.  2. Diffuse interstitial prominence of the lung apices, with small bilateral pleural effusions, concerning for pulmonary edema. Original Report Authenticated By: Tonia Ghent, M.D.  Ct Cervical Spine Wo Contrast  03/30/2012 *RADIOLOGY REPORT* Clinical Data: Status post fall; laceration above the left eye. Concern for head or cervical spine injury. CT HEAD WITHOUT CONTRAST AND CT CERVICAL SPINE WITHOUT CONTRAST Technique: Multidetector CT imaging of the head and cervical spine was performed following the standard protocol without intravenous contrast. Multiplanar CT image reconstructions of the cervical spine were also generated. Comparison: None CT HEAD Findings: There is no evidence of acute infarction, mass lesion, or intra- or extra-axial hemorrhage on CT. Prominence of the sulci suggests mild cortical volume loss. Relatively diffuse periventricular and subcortical white matter change likely  reflects small vessel ischemic microangiopathy. Cerebellar atrophy is noted. A small chronic lacunar infarct is noted at the anterior right thalamus. Chronic ischemic change is seen at the external capsule bilaterally. The brainstem and fourth ventricle are within normal limits. The cerebral hemispheres demonstrate grossly normal gray-white differentiation. No mass effect or midline shift is seen. There is no evidence of fracture; visualized osseous structures are unremarkable in appearance. The orbits are within normal limits. The paranasal sinuses and mastoid air cells are well- aerated. The known soft tissue laceration is not well characterized on CT. IMPRESSION: 1. No evidence of traumatic intracranial injury or fracture. 2. Mild cortical volume loss and diffuse small vessel ischemic microangiopathy. 3. Small chronic infarct at the anterior right thalamus, and chronic ischemic change at the external capsule bilaterally. CT CERVICAL SPINE Findings: There is no evidence of fracture or subluxation. Vertebral bodies demonstrate normal height and alignment. Intervertebral disc spaces are preserved. Small anterior disc osteophyte complexes are noted along the cervical spine. Prevertebral soft tissues are within normal limits. The visualized neural foramina are grossly unremarkable. The thyroid gland is unremarkable in appearance. Diffuse interstitial prominence is noted at the lung apices, with small bilateral pleural effusions, concerning for pulmonary edema. Mild calcification is noted along the left vertebral artery at the level of C2. IMPRESSION: 1. No evidence of fracture or subluxation along the cervical spine. 2. Diffuse interstitial prominence of the lung apices, with small bilateral pleural effusions, concerning for pulmonary edema. Original Report Authenticated By: Tonia Ghent, M.D.  Dg Chest Port 1 View  03/30/2012 *RADIOLOGY REPORT* Clinical Data: Preoperative chest radiograph. PORTABLE CHEST - 1 VIEW  Comparison: Chest radiograph performed 02/26/2012 Findings: The lungs are well-aerated. Vascular congestion is noted. Bilateral central and bibasilar airspace opacities may reflect mild pulmonary edema. There is no pleural effusion or pneumothorax. The cardiomediastinal silhouette is borderline normal in size. No acute osseous abnormalities are seen. IMPRESSION: Vascular congestion noted. Bilateral central and bibasilar airspace opacities may reflect mild pulmonary edema. This may be transient, due to the patient's recent fall. Original Report Authenticated By: Tonia Ghent, M.D.   Assessment/Plan  Principal Problem:  Schizophrenia, paranoid type  Active Problems:  Non compliance w medication regimen  Incontinence  Type II or unspecified type diabetes mellitus without mention of complication, uncontrolled   1. Hip fracture: L femoral neck- ortho has seen and plans for surgery later this PM, patient does not have capacity and family wishes to procede with surgery, will need PT/OT consult- ? Back to [facility] vs SNF 2. Schizophrenia- continue medications at [facility] And ask psych to follow in hospital, sitter for now 3. DM- [facility] psych  Ortho  Code Status: full  Family Communication: patient at bedside  Disposition Plan: ?  Time spent: 5  Marlin Canary  [facility]  Pager [phone]  If 7PM-7AM, please contact night-coverage  [email or web address]  Password [facility]  03/30/2012, 10:41 AM

## 2012-03-30 NOTE — Consult Note (Signed)
Patient Identification:  David Lang Date of Evaluation:  03/30/2012 Reason for Consult:  Pt. Larey Seat, has broken hip; refuses treatment  Referring Provider: Dr.Mitchell,ED, Verne Spurr PA  History of Present Illness:Pt fell during transfer from  Wheelchair, broke his hip and was complaining of pain. He was transferred to [facility] ED for evaluation and scheduled surgery.  Pt refuses surgery. Psychiatric consultation is requested.    Past Psychiatric History: Pt was found several months ago in his home when there was a Tour manager.  Pt was brought to [facility] ED where renal problems were discovered.  He had refused many tests and treatment.  Psychiatric Consultation at that time had determined he did not have capacity.  He was transferred to [facility] for his psychiatric treatment  For Schizophrenia paranoid type.  He has been there until he fell last night.  While at [facility] he has been under IVC.  He was under forced medication for his benefit. 03/07/12 for not having the capacity to understand need for treatment.   Remote history revealed he had been given medication and was monitored by phone by his sister who had medications mailed to him.  She called him daily to remind him of taking his medication.  He stopped answering the phone about a year ago.  He had not filled any prescription for that period of time.   Past Medical History:     Past Medical History  Diagnosis Date  . Hypertension   . Diabetes mellitus without complication   . Mental disorder   . Schizophrenia       History reviewed. No pertinent past surgical history.  Allergies: No Known Allergies  Current Medications:  Prior to Admission medications   Medication Sig Start Date End Date Taking? Authorizing Provider  aspirin 325 MG EC tablet Take 325 mg by mouth daily.   Yes Historical Provider, MD    Social History:    reports that he has never smoked. He does not have any smokeless tobacco history on file. He reports that he does not drink  alcohol or use illicit drugs.   Family History:    History reviewed. No pertinent family history.  Mental Status Examination/Evaluation: Objective:  Appearance: Disheveled and pt has very long gray hair,, bushy moustache and beard  Eye Contact::  Good  Speech:  Clear and Coherent and loud and gutteral, difficult to understand  Volume:  Increased  Mood:    Affect:  Blunt and Non-Congruent  Thought Process:  Disorganized, Loose and unable to indicate he understands the severity of his injury and need of repair  Orientation:  Other:  Pt is oriented to self only  Thought Content:  Paranoid Ideation and despite explanations, he continues to claim he needs to consult with the higher authorities before giving an opinion  Suicidal Thoughts:  No  Homicidal Thoughts:  No  Judgement:  Impaired  Insight:  Lacking   DIAGNOSIS:   AXIS I  Schizophrenia, paranoid type  AXIS II  Deferred  AXIS III See medical notes.  AXIS IV housing problems, other psychosocial or environmental problems, problems related to social environment, problems with primary support group and primary family sister in [place] and sister in [place] are concerned but have not completed request to obtain power of attorney for medical care  AXIS V 21-30 behavior considerably influenced by delusions or hallucinations OR serious impairment in judgment, communication OR inability to function in almost all areas   Assessment/Plan:  Discussed with Dr. Hyacinth Meeker, ED; Tessa Lerner  PA-[facility], reviewed IVC and forced medication documents faxed from [facility].  Pt is in exam. rm #10 [facility] ED,  Ophelia Charter is present.  Pt is lying on gurney but is restless, pulling at blanket.  He is awake.  He says the date is 18.and is unable to state month, day; then corrects date to 2014.  He thinks he is at home and gives' home address'.  He is able to state his sisters' names.  He appears oblivious to where he is and why he was brought to the ED.  He only asks for a drink.   He is told that he has a broken hip.  A bluish swelling of his Left Eyebrow, periorbital ridge is noted.  Pt makes no mention of facial swelling or any pain in L hip.  He is told he has a broken him.  He denies this information.  He is asked if he gives consent to repair the hip fracture - so he may walk again.  He loudly protests and says he will only speak to the higher authorities about it.  It is explained that the doctors have determined there is a fracture of his Left Hip and it needs to be repaired so he may walk again.  He fails to grasps the seriousness of his situation and the consequences of denying surgery.  He repeatedly says he will only consult with the higher authorities, not identifying whom he will speak with.  He is assured the doctors have determined his hip/leg has a fracture that needs to be repaired.  He declines to talk any more.  Pt is unable to communicate any understanding of the necessity for surgery to repair broken hip nor the consequences, more importantly if he continues to refuse treatment.  RECOMMENDATION:  1.  Pt does not have capacity to comprehend the emergency and need for surgical repair of left broken hip 2.  Communicated findings with Dr. Hyacinth Meeker; obtained faxed documents from [facility] IVC, forced medication. 3.  Reviewed sources of sisters' contact numbers in [facility] chart with Dr. Hyacinth Meeker. Suggest contacting them. 4.  No further psyhciatric needs at this time.  MD Psychiatrist signs off.  PHYLLIS [name] MD 03/30/2012 9:28 AM

## 2012-03-30 NOTE — Progress Notes (Signed)
Pt continues on 1:1 observation for safety.  Pt seems weaker and more unsteady this evening.  See progress notes in shadow chart.

## 2012-03-30 NOTE — Progress Notes (Signed)
Patient ID: David Lang, male   DOB: 27-Jul-1950, 62 y.o.   MRN: [id number]   S: Notified by nursing tech who is observing Pt on one to one, that he fell after loosing his balance once he stood up from his wheel chair. Patient was lying on his left side with noticeable active bleeding coming from above his left eye and the patient holding his right hip. There is no report of loss of consciousness and the patient was alert and orientated at baseline. Pt does endorse some pain involving his right hip, but denies HA, CP, SOB sx.   O: V/S BP 139/73, P 68  Gen: 61 y/o male moderate distress  Integument: Warm and dry, approximate 4 - 5 CM laceration above Left eye with active bleeding and soft tissue swelling.  Pulm: CTA  Cardio: audible S1, S2 without M/G/R  Neuro: CN 2- 12 grossly intact  A/P:  1) Suspected mechanical fall with subsequent laceration above left eye along with likely contusion involving left hip r/o FX of left hip and  Cervical spine. 911 notified, EMS promptly transported the Patient with cervical- collar too [facility] ED for further medical mgmt.

## 2012-03-30 NOTE — Consult Note (Signed)
Reason for Consult:   Left hip fracture Referring Physician:    EDP  Kapil Petropoulos is an 62 y.o. male  Who has some psych issues who fell related it sounds like due to a delusional episode. Was admitted to medicine for hip fracture pathway.  Declining treatment currently.   Past Medical History  Diagnosis Date  . Hypertension   . Diabetes mellitus without complication   . Mental disorder   . Schizophrenia     History reviewed. No pertinent past surgical history.  History reviewed. No pertinent family history.  Social History:  reports that he has never smoked. He does not have any smokeless tobacco history on file. He reports that he does not drink alcohol or use illicit drugs.  Allergies: No Known Allergies  Medications: I have reviewed the patient's current medications.  Results for orders placed during the hospital encounter of 03/04/12 (from the past 48 hour(s))  GLUCOSE, CAPILLARY     Status: Abnormal   Collection Time    03/28/12 11:40 AM      Result Value Range   Glucose-Capillary 121 (*) 70 - 99 mg/dL   Comment 1 Documented in Chart     Comment 2 Notify RN    GLUCOSE, CAPILLARY     Status: Abnormal   Collection Time    03/28/12  5:04 PM      Result Value Range   Glucose-Capillary 100 (*) 70 - 99 mg/dL   Comment 1 Documented in Chart     Comment 2 Notify RN    GLUCOSE, CAPILLARY     Status: Abnormal   Collection Time    03/29/12  5:21 PM      Result Value Range   Glucose-Capillary 113 (*) 70 - 99 mg/dL    Dg Hip Complete Left  03/30/2012  s*RADIOLOGY REPORT*  Clinical Data: Status post fall; left hip pain.  LEFT HIP - COMPLETE 2+ VIEW  Comparison: None.  Findings: There is a mildly displaced subcapital fracture involving the left femoral neck, with mild rotation of the femoral head.  The left femoral head remains seated at the acetabulum.  The right hip joint is unremarkable in appearance.  The sacroiliac joints are unremarkable in appearance.  The visualized bowel  gas pattern is grossly unremarkable.  IMPRESSION: Mildly displaced subcapital fracture involving the left femoral neck.   Original Report Authenticated By: Tonia Ghent, M.D.    Ct Head Wo Contrast  03/30/2012  *RADIOLOGY REPORT*  Clinical Data:  Status post fall; laceration above the left eye. Concern for head or cervical spine injury.  CT HEAD WITHOUT CONTRAST AND CT CERVICAL SPINE WITHOUT CONTRAST  Technique:  Multidetector CT imaging of the head and cervical spine was performed following the standard protocol without intravenous contrast.  Multiplanar CT image reconstructions of the cervical spine were also generated.  Comparison: None  CT HEAD  Findings: There is no evidence of acute infarction, mass lesion, or intra- or extra-axial hemorrhage on CT.  Prominence of the sulci suggests mild cortical volume loss. Relatively diffuse periventricular and subcortical white matter change likely reflects small vessel ischemic microangiopathy. Cerebellar atrophy is noted.  A small chronic lacunar infarct is noted at the anterior right thalamus.  Chronic ischemic change is seen at the external capsule bilaterally.  The brainstem and fourth ventricle are within normal limits.  The cerebral hemispheres demonstrate grossly normal gray-white differentiation.  No mass effect or midline shift is seen.   There is no evidence of fracture; visualized osseous  structures are unremarkable in appearance.  The orbits are within normal limits.  The paranasal sinuses and mastoid air cells are well- aerated.  The known soft tissue laceration is not well characterized on CT.  IMPRESSION:  1.  No evidence of traumatic intracranial injury or fracture. 2.  Mild cortical volume loss and diffuse small vessel ischemic microangiopathy. 3.  Small chronic infarct at the anterior right thalamus, and chronic ischemic change at the external capsule bilaterally.  CT CERVICAL SPINE  Findings: There is no evidence of fracture or subluxation. Vertebral  bodies demonstrate normal height and alignment. Intervertebral disc spaces are preserved.  Small anterior disc osteophyte complexes are noted along the cervical spine. Prevertebral soft tissues are within normal limits.  The visualized neural foramina are grossly unremarkable.  The thyroid gland is unremarkable in appearance.  Diffuse interstitial prominence is noted at the lung apices, with small bilateral pleural effusions, concerning for pulmonary edema.  Mild calcification is noted along the left vertebral artery at the level of C2.  IMPRESSION:  1.  No evidence of fracture or subluxation along the cervical spine. 2.  Diffuse interstitial prominence of the lung apices, with small bilateral pleural effusions, concerning for pulmonary edema.   Original Report Authenticated By: Tonia Ghent, M.D.    Ct Cervical Spine Wo Contrast  03/30/2012  *RADIOLOGY REPORT*  Clinical Data:  Status post fall; laceration above the left eye. Concern for head or cervical spine injury.  CT HEAD WITHOUT CONTRAST AND CT CERVICAL SPINE WITHOUT CONTRAST  Technique:  Multidetector CT imaging of the head and cervical spine was performed following the standard protocol without intravenous contrast.  Multiplanar CT image reconstructions of the cervical spine were also generated.  Comparison: None  CT HEAD  Findings: There is no evidence of acute infarction, mass lesion, or intra- or extra-axial hemorrhage on CT.  Prominence of the sulci suggests mild cortical volume loss. Relatively diffuse periventricular and subcortical white matter change likely reflects small vessel ischemic microangiopathy. Cerebellar atrophy is noted.  A small chronic lacunar infarct is noted at the anterior right thalamus.  Chronic ischemic change is seen at the external capsule bilaterally.  The brainstem and fourth ventricle are within normal limits.  The cerebral hemispheres demonstrate grossly normal gray-white differentiation.  No mass effect or midline shift  is seen.   There is no evidence of fracture; visualized osseous structures are unremarkable in appearance.  The orbits are within normal limits.  The paranasal sinuses and mastoid air cells are well- aerated.  The known soft tissue laceration is not well characterized on CT.  IMPRESSION:  1.  No evidence of traumatic intracranial injury or fracture. 2.  Mild cortical volume loss and diffuse small vessel ischemic microangiopathy. 3.  Small chronic infarct at the anterior right thalamus, and chronic ischemic change at the external capsule bilaterally.  CT CERVICAL SPINE  Findings: There is no evidence of fracture or subluxation. Vertebral bodies demonstrate normal height and alignment. Intervertebral disc spaces are preserved.  Small anterior disc osteophyte complexes are noted along the cervical spine. Prevertebral soft tissues are within normal limits.  The visualized neural foramina are grossly unremarkable.  The thyroid gland is unremarkable in appearance.  Diffuse interstitial prominence is noted at the lung apices, with small bilateral pleural effusions, concerning for pulmonary edema.  Mild calcification is noted along the left vertebral artery at the level of C2.  IMPRESSION:  1.  No evidence of fracture or subluxation along the cervical spine. 2.  Diffuse  interstitial prominence of the lung apices, with small bilateral pleural effusions, concerning for pulmonary edema.   Original Report Authenticated By: Tonia Ghent, M.D.    Dg Chest Port 1 View  03/30/2012  *RADIOLOGY REPORT*  Clinical Data: Preoperative chest radiograph.  PORTABLE CHEST - 1 VIEW  Comparison: Chest radiograph performed 02/26/2012  Findings: The lungs are well-aerated.  Vascular congestion is noted.  Bilateral central and bibasilar airspace opacities may reflect mild pulmonary edema.  There is no pleural effusion or pneumothorax.  The cardiomediastinal silhouette is borderline normal in size.  No acute osseous abnormalities are seen.   IMPRESSION: Vascular congestion noted.  Bilateral central and bibasilar airspace opacities may reflect mild pulmonary edema.  This may be transient, due to the patient's recent fall.   Original Report Authenticated By: Tonia Ghent, M.D.     @ROS @ Blood pressure 173/88, pulse 67, temperature 97.7 F (36.5 C), temperature source Oral, resp. rate 16, height 5' 9.5" (1.765 m), weight 80.74 kg (178 lb), SpO2 93.00%.  PHYSICAL EXAM:   Neurologically intact ABD soft Sensation intact distally Intact pulses distally Dorsiflexion/Plantar flexion intact Compartment soft  Left leg is SE Other three ext move well  ASSESSMENT:   Left hip femoral neck fracture  PLAN:   Needs hemi hip to stand and hopefully walk again.   Currently declining treatment.  Will allow all of this to sort out today and I have put him on the schedule for this evening assuming it all works out.  Alternative treatment is several months of bedrest and acceptance of the deformity and chronic pain.   Armari Fussell G 03/30/2012, 8:25 AM

## 2012-03-30 NOTE — Progress Notes (Signed)
Pt confirmed with cm he does not have a local pcp

## 2012-03-30 NOTE — Progress Notes (Signed)
At approx 0256, MHT notified staff that pt had fallen in his room.  Pt is already on 1:1 observation for unsteady gait and high fall risk.  Pt was sitting in a wheelchair with the sitter by his side.  Sitter reported that pt "bolted" from his chair and immediately lost his balance.  He fell forward and hit his head on the floor, causing a gash above his L eye, about 4 cm long.  He was also c/o L hip pain.  A pillow was placed under pt's head for comfort and VS were taken.   Donell Sievert, PA on the unit was at pt's side with staff.  Bleeding was controlled and pt's movement stabilized.  Pt was assessed and EMS called.  ED was notified of pt transfer.  Sitter accompanied pt to the ED.

## 2012-03-30 NOTE — ED Notes (Signed)
Per EMS, patient from [facility] for evaluation of fall. Patient was seated in wheelchair and sustained a fall resulting in laceration above his left eye.

## 2012-03-30 NOTE — ED Notes (Addendum)
Chaplain consulted due to pt requesting to speak with religious and political authorities prior to surgery.  Nursing reports pt has also previously declined IV.    Pt lying in bed, sitter at bedside.  Pt's speech difficult to discern.  Pt spoke with chaplain about religious credentials and his felt need to pray more.  Chaplain provided support with pt and directed pt conversation toward fall and injury.  Pt spoke of curses directed at him from [facility] - especially at his feet (reason for fall?).  Pt had wished to speak with religious authorities as he longed to do "right thing."  Chaplain provided support in pt's fear and worked to orient pt to location in hospital as safe place.    Pt and chaplain spoke of hip surgery as opportunity to be healthier and move forward - reinforced that staff can help him here.  Pt was receptive.  Chaplain also spoke with pt about IV, explaining to as a way of keeping him safer and making sure he has fluid and medication.  Pt expressed that he would have IV if it is necessary.    Chaplain reported to nursing, continued to provide support with pt around fear and reinforce hospital and staff as safe environment.   Pt refused to have undergarments changed by nursing because he was in pain.  At bedside while RN attempted IV.   IV was unsuccessful on first attempt.  Pt became increasingly agitated and did not allow further attempts at IV.  Attempted leave bed and leave hospital stating, "I do not trust that I can receive care here."  Chaplain worked to reinforce hospital as safe setting.  Pt stated "the pain is too great... give me pills or let me suffer death." At bedside as two other RN attempted to convince pt to allow IV.   Pt refuses.

## 2012-03-31 ENCOUNTER — Inpatient Hospital Stay (HOSPITAL_COMMUNITY): Payer: Medicaid Other | Admitting: Certified Registered Nurse Anesthetist

## 2012-03-31 ENCOUNTER — Encounter (HOSPITAL_COMMUNITY)
Admission: AD | Disposition: A | Discharge status: Skilled Nursing Facility | Payer: Self-pay | Source: Other Acute Inpatient Hospital | Attending: Internal Medicine

## 2012-03-31 ENCOUNTER — Encounter (HOSPITAL_COMMUNITY): Payer: Self-pay | Admitting: Certified Registered Nurse Anesthetist

## 2012-03-31 ENCOUNTER — Inpatient Hospital Stay (HOSPITAL_COMMUNITY): Payer: Medicaid Other

## 2012-03-31 DIAGNOSIS — I10 Essential (primary) hypertension: Secondary | ICD-10-CM

## 2012-03-31 DIAGNOSIS — S72019A Unspecified intracapsular fracture of unspecified femur, initial encounter for closed fracture: Secondary | ICD-10-CM

## 2012-03-31 HISTORY — PX: HIP ARTHROPLASTY: SHX981

## 2012-03-31 HISTORY — DX: Unspecified intracapsular fracture of unspecified femur, initial encounter for closed fracture: S72.019A

## 2012-03-31 LAB — CBC
HCT: 31.6 % — ABNORMAL LOW (ref 39.0–52.0)
MCH: 29.7 pg (ref 26.0–34.0)
MCHC: 34.2 g/dL (ref 30.0–36.0)
MCV: 86.8 fL (ref 78.0–100.0)
Platelets: 359 10*3/uL (ref 150–400)
RDW: 13 % (ref 11.5–15.5)
WBC: 9.5 10*3/uL (ref 4.0–10.5)

## 2012-03-31 LAB — BASIC METABOLIC PANEL
BUN: 22 mg/dL (ref 6–23)
Calcium: 8.3 mg/dL — ABNORMAL LOW (ref 8.4–10.5)
Chloride: 100 mEq/L (ref 96–112)
Creatinine, Ser: 1.62 mg/dL — ABNORMAL HIGH (ref 0.50–1.35)
GFR calc Af Amer: 51 mL/min — ABNORMAL LOW (ref >90)

## 2012-03-31 LAB — GLUCOSE, CAPILLARY: Glucose-Capillary: 100 mg/dL — ABNORMAL HIGH (ref 70–99)

## 2012-03-31 SURGERY — HEMIARTHROPLASTY, HIP, DIRECT ANTERIOR APPROACH, FOR FRACTURE
Anesthesia: General | Site: Hip | Laterality: Left | Wound class: Clean

## 2012-03-31 MED ORDER — LACTATED RINGERS IV SOLN
INTRAVENOUS | Status: DC | PRN
  Administered 2012-03-31: 17:00:00 via INTRAVENOUS

## 2012-03-31 MED ORDER — ZOLPIDEM TARTRATE 5 MG PO TABS
5.0000 mg | ORAL_TABLET | Freq: Every evening | ORAL | Status: DC | PRN
  Administered 2012-03-31 – 2012-04-12 (×4): 5 mg via ORAL
  Filled 2012-03-31 (×5): qty 1

## 2012-03-31 MED ORDER — CEFAZOLIN SODIUM-DEXTROSE 2-3 GM-% IV SOLR
2.0000 g | Freq: Four times a day (QID) | INTRAVENOUS | Status: AC
  Administered 2012-03-31 – 2012-04-01 (×2): 2 g via INTRAVENOUS
  Filled 2012-03-31 (×2): qty 50

## 2012-03-31 MED ORDER — ONDANSETRON HCL 4 MG/2ML IJ SOLN
INTRAMUSCULAR | Status: DC | PRN
  Administered 2012-03-31 (×2): 2 mg via INTRAVENOUS

## 2012-03-31 MED ORDER — SUCCINYLCHOLINE CHLORIDE 20 MG/ML IJ SOLN
INTRAMUSCULAR | Status: DC | PRN
  Administered 2012-03-31: 100 mg via INTRAVENOUS

## 2012-03-31 MED ORDER — BISACODYL 5 MG PO TBEC: 5.0000 mg | DELAYED_RELEASE_TABLET | Freq: Every day | ORAL | Status: DC | PRN

## 2012-03-31 MED ORDER — ONDANSETRON HCL 4 MG/2ML IJ SOLN
4.0000 mg | Freq: Four times a day (QID) | INTRAMUSCULAR | Status: DC | PRN
  Administered 2012-04-03: 4 mg via INTRAVENOUS
  Filled 2012-03-31: qty 2

## 2012-03-31 MED ORDER — ALUM & MAG HYDROXIDE-SIMETH 200-200-20 MG/5ML PO SUSP
30.0000 mL | ORAL | Status: DC | PRN
  Administered 2012-04-02: 30 mL via ORAL
  Filled 2012-03-31 (×2): qty 30

## 2012-03-31 MED ORDER — ACETAMINOPHEN 650 MG RE SUPP: 650.0000 mg | Freq: Four times a day (QID) | RECTAL | Status: DC | PRN

## 2012-03-31 MED ORDER — FLEET ENEMA 7-19 GM/118ML RE ENEM: 1.0000 | ENEMA | Freq: Once | RECTAL | Status: AC | PRN

## 2012-03-31 MED ORDER — ONDANSETRON HCL 4 MG PO TABS: 4.0000 mg | ORAL_TABLET | Freq: Four times a day (QID) | ORAL | Status: DC | PRN

## 2012-03-31 MED ORDER — METOCLOPRAMIDE HCL 10 MG PO TABS: 5.0000 mg | ORAL_TABLET | Freq: Three times a day (TID) | ORAL | Status: DC | PRN

## 2012-03-31 MED ORDER — HYDROCODONE-ACETAMINOPHEN 5-325 MG PO TABS
1.0000 | ORAL_TABLET | Freq: Four times a day (QID) | ORAL | Status: DC | PRN
  Administered 2012-04-01 – 2012-04-16 (×16): 2 via ORAL
  Filled 2012-03-31 (×19): qty 2

## 2012-03-31 MED ORDER — SODIUM CHLORIDE 0.9 % IV SOLN
INTRAVENOUS | Status: DC
  Administered 2012-03-31 – 2012-04-01 (×2): via INTRAVENOUS

## 2012-03-31 MED ORDER — MENTHOL 3 MG MT LOZG
1.0000 | LOZENGE | OROMUCOSAL | Status: DC | PRN
  Filled 2012-03-31: qty 9

## 2012-03-31 MED ORDER — PHENYLEPHRINE HCL 10 MG/ML IJ SOLN
INTRAMUSCULAR | Status: DC | PRN
  Administered 2012-03-31 (×2): 80 ug via INTRAVENOUS

## 2012-03-31 MED ORDER — EPHEDRINE SULFATE 50 MG/ML IJ SOLN
INTRAMUSCULAR | Status: DC | PRN
  Administered 2012-03-31: 10 mg via INTRAVENOUS
  Administered 2012-03-31: 5 mg via INTRAVENOUS
  Administered 2012-03-31: 10 mg via INTRAVENOUS
  Administered 2012-03-31: 15 mg via INTRAVENOUS

## 2012-03-31 MED ORDER — SODIUM CHLORIDE 0.9 % IV SOLN: INTRAVENOUS | Status: DC

## 2012-03-31 MED ORDER — NEOSTIGMINE METHYLSULFATE 1 MG/ML IJ SOLN
INTRAMUSCULAR | Status: DC | PRN
  Administered 2012-03-31: 3 mg via INTRAVENOUS

## 2012-03-31 MED ORDER — HYDROMORPHONE HCL PF 1 MG/ML IJ SOLN
0.2500 mg | INTRAMUSCULAR | Status: DC | PRN
  Administered 2012-04-01 – 2012-04-03 (×4): 0.5 mg via INTRAVENOUS
  Filled 2012-03-31 (×4): qty 1

## 2012-03-31 MED ORDER — ASPIRIN EC 325 MG PO TBEC: 325.0000 mg | DELAYED_RELEASE_TABLET | Freq: Two times a day (BID) | ORAL | Status: DC

## 2012-03-31 MED ORDER — PHENOL 1.4 % MT LIQD
1.0000 | OROMUCOSAL | Status: DC | PRN
  Filled 2012-03-31: qty 177

## 2012-03-31 MED ORDER — PROPOFOL 10 MG/ML IV BOLUS
INTRAVENOUS | Status: DC | PRN
  Administered 2012-03-31: 150 mg via INTRAVENOUS

## 2012-03-31 MED ORDER — GLYCOPYRROLATE 0.2 MG/ML IJ SOLN
INTRAMUSCULAR | Status: DC | PRN
  Administered 2012-03-31: 0.4 mg via INTRAVENOUS

## 2012-03-31 MED ORDER — CEFAZOLIN SODIUM-DEXTROSE 2-3 GM-% IV SOLR
2.0000 g | Freq: Once | INTRAVENOUS | Status: AC
  Administered 2012-03-31: 2 g via INTRAVENOUS

## 2012-03-31 MED ORDER — METOCLOPRAMIDE HCL 5 MG/ML IJ SOLN: 5.0000 mg | Freq: Three times a day (TID) | INTRAMUSCULAR | Status: DC | PRN

## 2012-03-31 MED ORDER — STERILE WATER FOR IRRIGATION IR SOLN
Status: DC | PRN
  Administered 2012-03-31: 3000 mL

## 2012-03-31 MED ORDER — OXYCODONE HCL 5 MG PO TABS
5.0000 mg | ORAL_TABLET | ORAL | Status: DC | PRN
  Administered 2012-04-05 – 2012-04-12 (×3): 10 mg via ORAL
  Filled 2012-03-31 (×4): qty 2

## 2012-03-31 MED ORDER — MORPHINE SULFATE 2 MG/ML IJ SOLN
0.5000 mg | INTRAMUSCULAR | Status: DC | PRN
  Administered 2012-04-04: 0.5 mg via INTRAVENOUS
  Filled 2012-03-31: qty 1

## 2012-03-31 MED ORDER — POLYETHYLENE GLYCOL 3350 17 G PO PACK
17.0000 g | PACK | Freq: Every day | ORAL | Status: DC | PRN
  Filled 2012-03-31: qty 1

## 2012-03-31 MED ORDER — ASPIRIN EC 325 MG PO TBEC
325.0000 mg | DELAYED_RELEASE_TABLET | Freq: Two times a day (BID) | ORAL | Status: DC
  Administered 2012-03-31 – 2012-04-18 (×31): 325 mg via ORAL
  Filled 2012-03-31 (×41): qty 1

## 2012-03-31 MED ORDER — FENTANYL CITRATE 0.05 MG/ML IJ SOLN
INTRAMUSCULAR | Status: DC | PRN
  Administered 2012-03-31 (×2): 50 ug via INTRAVENOUS
  Administered 2012-03-31 (×2): 25 ug via INTRAVENOUS
  Administered 2012-03-31: 50 ug via INTRAVENOUS

## 2012-03-31 MED ORDER — ACETAMINOPHEN 325 MG PO TABS
650.0000 mg | ORAL_TABLET | Freq: Four times a day (QID) | ORAL | Status: DC | PRN
  Administered 2012-04-01: 650 mg via ORAL
  Filled 2012-03-31 (×2): qty 2

## 2012-03-31 MED ORDER — 0.9 % SODIUM CHLORIDE (POUR BTL) OPTIME
TOPICAL | Status: DC | PRN
  Administered 2012-03-31: 1000 mL

## 2012-03-31 MED ORDER — LIDOCAINE HCL (CARDIAC) 20 MG/ML IV SOLN
INTRAVENOUS | Status: DC | PRN
  Administered 2012-03-31: 100 mg via INTRAVENOUS

## 2012-03-31 MED ORDER — DOCUSATE SODIUM 100 MG PO CAPS: 100.0000 mg | ORAL_CAPSULE | Freq: Two times a day (BID) | ORAL | Status: DC

## 2012-03-31 MED ORDER — ROCURONIUM BROMIDE 100 MG/10ML IV SOLN
INTRAVENOUS | Status: DC | PRN
  Administered 2012-03-31: 30 mg via INTRAVENOUS

## 2012-03-31 MED ORDER — OXYCODONE-ACETAMINOPHEN 5-325 MG PO TABS: 1.0000 | ORAL_TABLET | ORAL | Status: DC | PRN

## 2012-03-31 SURGICAL SUPPLY — 56 items
BAG ZIPLOCK 12X15 (MISCELLANEOUS) ×2 IMPLANT
BLADE SAW SAG 73X25 THK (BLADE) ×1
BLADE SAW SGTL 73X25 THK (BLADE) ×1 IMPLANT
BRUSH FEMORAL CANAL (MISCELLANEOUS) ×2 IMPLANT
CANISTER SUCTION 2500CC (MISCELLANEOUS) IMPLANT
CEMENT BONE DEPUY (Cement) ×4 IMPLANT
CEMENT RESTRICTOR DEPUY SZ 3 (Cement) ×2 IMPLANT
CLOTH BEACON ORANGE TIMEOUT ST (SAFETY) ×2 IMPLANT
COVER SURGICAL LIGHT HANDLE (MISCELLANEOUS) IMPLANT
DRAPE INCISE IOBAN 66X45 STRL (DRAPES) ×2 IMPLANT
DRAPE ORTHO SPLIT 77X108 STRL (DRAPES) ×2
DRAPE POUCH INSTRU U-SHP 10X18 (DRAPES) ×2 IMPLANT
DRAPE SURG 17X11 SM STRL (DRAPES) ×2 IMPLANT
DRAPE SURG ORHT 6 SPLT 77X108 (DRAPES) ×2 IMPLANT
DRAPE U-SHAPE 47X51 STRL (DRAPES) ×2 IMPLANT
DRSG ADAPTIC 3X8 NADH LF (GAUZE/BANDAGES/DRESSINGS) ×2 IMPLANT
DRSG EMULSION OIL 3X16 NADH (GAUZE/BANDAGES/DRESSINGS) IMPLANT
DRSG MEPILEX BORDER 4X8 (GAUZE/BANDAGES/DRESSINGS) IMPLANT
DRSG PAD ABDOMINAL 8X10 ST (GAUZE/BANDAGES/DRESSINGS) ×2 IMPLANT
DURAPREP 26ML APPLICATOR (WOUND CARE) ×2 IMPLANT
ELECT REM PT RETURN 9FT ADLT (ELECTROSURGICAL) ×2
ELECTRODE REM PT RTRN 9FT ADLT (ELECTROSURGICAL) ×1 IMPLANT
EVACUATOR 1/8 PVC DRAIN (DRAIN) IMPLANT
FACESHIELD LNG OPTICON STERILE (SAFETY) ×8 IMPLANT
GLOVE BIOGEL PI IND STRL 8 (GLOVE) ×1 IMPLANT
GLOVE BIOGEL PI INDICATOR 8 (GLOVE) ×1
GLOVE ORTHO TXT STRL SZ7.5 (GLOVE) ×4 IMPLANT
GLOVE SURG ORTHO 8.0 STRL STRW (GLOVE) ×2 IMPLANT
GOWN STRL REIN XL XLG (GOWN DISPOSABLE) ×8 IMPLANT
HOOD PEEL AWAY FACE SHEILD DIS (HOOD) ×4 IMPLANT
IMMOBILIZER KNEE 20 (SOFTGOODS) ×2
IMMOBILIZER KNEE 20 THIGH 36 (SOFTGOODS) ×1 IMPLANT
KIT BASIN OR (CUSTOM PROCEDURE TRAY) ×2 IMPLANT
MANIFOLD NEPTUNE II (INSTRUMENTS) ×2 IMPLANT
NEEDLE MA TROC 1/2 (NEEDLE) IMPLANT
NEEDLE MA TROC 1/2 CIR (NEEDLE) IMPLANT
NEEDLE MAYO .5 CIRCLE (NEEDLE) ×2 IMPLANT
PACK TOTAL JOINT (CUSTOM PROCEDURE TRAY) ×2 IMPLANT
PASSER SUT SWANSON 36MM LOOP (INSTRUMENTS) ×2 IMPLANT
POSITIONER SURGICAL ARM (MISCELLANEOUS) ×2 IMPLANT
PRESSURIZER FEMORAL UNIV (MISCELLANEOUS) ×2 IMPLANT
SPONGE GAUZE 4X4 12PLY (GAUZE/BANDAGES/DRESSINGS) ×2 IMPLANT
STAPLER VISISTAT 35W (STAPLE) ×2 IMPLANT
STRIP CLOSURE SKIN 1/2X4 (GAUZE/BANDAGES/DRESSINGS) IMPLANT
SUT ETHIBOND NAB CT1 #1 30IN (SUTURE) ×6 IMPLANT
SUT MNCRL AB 4-0 PS2 18 (SUTURE) ×2 IMPLANT
SUT VIC AB 0 CT1 27 (SUTURE) ×1
SUT VIC AB 0 CT1 27XBRD ANTBC (SUTURE) ×1 IMPLANT
SUT VIC AB 1 CT1 27 (SUTURE) ×3
SUT VIC AB 1 CT1 27XBRD ANTBC (SUTURE) ×3 IMPLANT
SUT VIC AB 2-0 CT1 27 (SUTURE) ×3
SUT VIC AB 2-0 CT1 TAPERPNT 27 (SUTURE) ×3 IMPLANT
TAPE CLOTH SURG 6X10 WHT LF (GAUZE/BANDAGES/DRESSINGS) ×2 IMPLANT
TOWEL OR 17X26 10 PK STRL BLUE (TOWEL DISPOSABLE) ×2 IMPLANT
TOWEL OR NON WOVEN STRL DISP B (DISPOSABLE) ×2 IMPLANT
TRAY FOLEY CATH 14FRSI W/METER (CATHETERS) IMPLANT

## 2012-03-31 NOTE — Anesthesia Preprocedure Evaluation (Addendum)
Anesthesia Evaluation  Patient identified by MRN, date of birth, ID band Patient awake    Reviewed: Allergy & Precautions, H&P , NPO status , Patient's Chart, lab work & pertinent test results  Airway Mallampati: II TM Distance: >3 FB Neck ROM: Full    Dental no notable dental hx.    Pulmonary neg pulmonary ROS,  breath sounds clear to auscultation  Pulmonary exam normal       Cardiovascular hypertension, negative cardio ROS  Rhythm:Regular Rate:Normal  ECG reviewed. Prolonged QT. NS ST and TWA   Neuro/Psych PSYCHIATRIC DISORDERS Schizophrenia negative neurological ROS     GI/Hepatic negative GI ROS, Neg liver ROS,   Endo/Other  negative endocrine ROSdiabetes, Type 2  Renal/GU Renal InsufficiencyRenal diseaseCr 1.62  negative genitourinary   Musculoskeletal negative musculoskeletal ROS (+)   Abdominal   Peds negative pediatric ROS (+)  Hematology negative hematology ROS (+)   Anesthesia Other Findings   Reproductive/Obstetrics negative OB ROS                          Anesthesia Physical Anesthesia Plan  ASA: III  Anesthesia Plan: General   Post-op Pain Management:    Induction: Intravenous  Airway Management Planned: Oral ETT  Additional Equipment:   Intra-op Plan:   Post-operative Plan: Extubation in OR  Informed Consent: I have reviewed the patients History and Physical, chart, labs and discussed the procedure including the risks, benefits and alternatives for the proposed anesthesia with the patient or authorized representative who has indicated his/her understanding and acceptance.   Dental advisory given  Plan Discussed with: CRNA and Surgeon  Anesthesia Plan Comments: (Patient has not signed his own consent as he has schizophrenia and impaired judgement. His sister, a physician, has given consent. He does state that he wants his hip repaired.  Apparently noncompliant  with diabetes medicines.)       Anesthesia Quick Evaluation

## 2012-03-31 NOTE — Progress Notes (Signed)
[facility] PROGRESS NOTE  David Lang AVW:[id number] DOB: April 15, 1950 DOA: 03/30/2012 PCP: Default, Provider, MD  Assessment/Plan: Left subcapital femoral neck fracture -Appreciate orthopedics -Surgery planned for today, 03/31/2012 Hypertension  -continue amlodipine -Continue atenolol -Blood pressure controlled  Paranoid schizophrenia -I consulted psychiatry to follow patient -Case discussed with Dr. Ferol Luz today -Patient is to have capacity to make any decisions -Continue Haldol, Klonopin, Trileptal, Artane as per psychiatry recommendations Diabetes mellitus type 2 - Hemoglobin A1c 8.8 on 03/22/2012 -Continue NovoLog sliding scale -Patient was not on any medications during his February admission -Remotely taken metformin Anemia -Repeat serum B12 as this was marginally low on 03/22/2012 -Check iron studies -Check RBC folate     Family Communication:   Pt at beside Disposition Plan:   SNF      Procedures/Studies: Dg Hip Complete Left  03/30/2012  s*RADIOLOGY REPORT*  Clinical Data: Status post fall; left hip pain.  LEFT HIP - COMPLETE 2+ VIEW  Comparison: None.  Findings: There is a mildly displaced subcapital fracture involving the left femoral neck, with mild rotation of the femoral head.  The left femoral head remains seated at the acetabulum.  The right hip joint is unremarkable in appearance.  The sacroiliac joints are unremarkable in appearance.  The visualized bowel gas pattern is grossly unremarkable.  IMPRESSION: Mildly displaced subcapital fracture involving the left femoral neck.   Original Report Authenticated By: Tonia Ghent, M.D.    Ct Head Wo Contrast  03/30/2012  *RADIOLOGY REPORT*  Clinical Data:  Status post fall; laceration above the left eye. Concern for head or cervical spine injury.  CT HEAD WITHOUT CONTRAST AND CT CERVICAL SPINE WITHOUT CONTRAST  Technique:  Multidetector CT imaging of the head and cervical spine was performed following the  standard protocol without intravenous contrast.  Multiplanar CT image reconstructions of the cervical spine were also generated.  Comparison: None  CT HEAD  Findings: There is no evidence of acute infarction, mass lesion, or intra- or extra-axial hemorrhage on CT.  Prominence of the sulci suggests mild cortical volume loss. Relatively diffuse periventricular and subcortical white matter change likely reflects small vessel ischemic microangiopathy. Cerebellar atrophy is noted.  A small chronic lacunar infarct is noted at the anterior right thalamus.  Chronic ischemic change is seen at the external capsule bilaterally.  The brainstem and fourth ventricle are within normal limits.  The cerebral hemispheres demonstrate grossly normal gray-white differentiation.  No mass effect or midline shift is seen.   There is no evidence of fracture; visualized osseous structures are unremarkable in appearance.  The orbits are within normal limits.  The paranasal sinuses and mastoid air cells are well- aerated.  The known soft tissue laceration is not well characterized on CT.  IMPRESSION:  1.  No evidence of traumatic intracranial injury or fracture. 2.  Mild cortical volume loss and diffuse small vessel ischemic microangiopathy. 3.  Small chronic infarct at the anterior right thalamus, and chronic ischemic change at the external capsule bilaterally.  CT CERVICAL SPINE  Findings: There is no evidence of fracture or subluxation. Vertebral bodies demonstrate normal height and alignment. Intervertebral disc spaces are preserved.  Small anterior disc osteophyte complexes are noted along the cervical spine. Prevertebral soft tissues are within normal limits.  The visualized neural foramina are grossly unremarkable.  The thyroid gland is unremarkable in appearance.  Diffuse interstitial prominence is noted at the lung apices, with small bilateral pleural effusions, concerning for pulmonary edema.  Mild calcification is noted along the  left vertebral artery at the level of C2.  IMPRESSION:  1.  No evidence of fracture or subluxation along the cervical spine. 2.  Diffuse interstitial prominence of the lung apices, with small bilateral pleural effusions, concerning for pulmonary edema.   Original Report Authenticated By: Tonia Ghent, M.D.    Ct Cervical Spine Wo Contrast  03/30/2012  *RADIOLOGY REPORT*  Clinical Data:  Status post fall; laceration above the left eye. Concern for head or cervical spine injury.  CT HEAD WITHOUT CONTRAST AND CT CERVICAL SPINE WITHOUT CONTRAST  Technique:  Multidetector CT imaging of the head and cervical spine was performed following the standard protocol without intravenous contrast.  Multiplanar CT image reconstructions of the cervical spine were also generated.  Comparison: None  CT HEAD  Findings: There is no evidence of acute infarction, mass lesion, or intra- or extra-axial hemorrhage on CT.  Prominence of the sulci suggests mild cortical volume loss. Relatively diffuse periventricular and subcortical white matter change likely reflects small vessel ischemic microangiopathy. Cerebellar atrophy is noted.  A small chronic lacunar infarct is noted at the anterior right thalamus.  Chronic ischemic change is seen at the external capsule bilaterally.  The brainstem and fourth ventricle are within normal limits.  The cerebral hemispheres demonstrate grossly normal gray-white differentiation.  No mass effect or midline shift is seen.   There is no evidence of fracture; visualized osseous structures are unremarkable in appearance.  The orbits are within normal limits.  The paranasal sinuses and mastoid air cells are well- aerated.  The known soft tissue laceration is not well characterized on CT.  IMPRESSION:  1.  No evidence of traumatic intracranial injury or fracture. 2.  Mild cortical volume loss and diffuse small vessel ischemic microangiopathy. 3.  Small chronic infarct at the anterior right thalamus, and  chronic ischemic change at the external capsule bilaterally.  CT CERVICAL SPINE  Findings: There is no evidence of fracture or subluxation. Vertebral bodies demonstrate normal height and alignment. Intervertebral disc spaces are preserved.  Small anterior disc osteophyte complexes are noted along the cervical spine. Prevertebral soft tissues are within normal limits.  The visualized neural foramina are grossly unremarkable.  The thyroid gland is unremarkable in appearance.  Diffuse interstitial prominence is noted at the lung apices, with small bilateral pleural effusions, concerning for pulmonary edema.  Mild calcification is noted along the left vertebral artery at the level of C2.  IMPRESSION:  1.  No evidence of fracture or subluxation along the cervical spine. 2.  Diffuse interstitial prominence of the lung apices, with small bilateral pleural effusions, concerning for pulmonary edema.   Original Report Authenticated By: Tonia Ghent, M.D.    Dg Chest Port 1 View  03/30/2012  *RADIOLOGY REPORT*  Clinical Data: Preoperative chest radiograph.  PORTABLE CHEST - 1 VIEW  Comparison: Chest radiograph performed 02/26/2012  Findings: The lungs are well-aerated.  Vascular congestion is noted.  Bilateral central and bibasilar airspace opacities may reflect mild pulmonary edema.  There is no pleural effusion or pneumothorax.  The cardiomediastinal silhouette is borderline normal in size.  No acute osseous abnormalities are seen.  IMPRESSION: Vascular congestion noted.  Bilateral central and bibasilar airspace opacities may reflect mild pulmonary edema.  This may be transient, due to the patient's recent fall.   Original Report Authenticated By: Tonia Ghent, M.D.          Subjective:  patient is drowsy this afternoon. There are no reports of respiratory distress, vomiting, diarrhea, chest pain.  Objective: Filed Vitals:   03/31/12 0605 03/31/12 0800 03/31/12 1109 03/31/12 1325  BP: 121/70   121/73   Pulse: 76   60  Temp: 98 F (36.7 C)   97.8 F (36.6 C)  TempSrc: Oral   Axillary  Resp: 20 20 18 20   SpO2: 97% 96% 96% 97%    Intake/Output Summary (Last 24 hours) at 03/31/12 1336 Last data filed at 03/31/12 0600  Gross per 24 hour  Intake      0 ml  Output    200 ml  Net   -200 ml   Weight change:  Exam:   General:  Pt is awake and stated protopathic stimuli, not in acute distress  HEENT: No icterus, No thrush, [place]/AT  Cardiovascular: RRR, S1/S2, no rubs, no gallops  Respiratory: poor inspiratory effort but clear to auscultation. No wheezes or rhonchi    Movement.  Abdomen: Soft/+BS, non tender, non distended, no guarding  Extremities: trace edema, No lymphangitis, No petechiae, No rashes, no synovitis  Data Reviewed: Basic Metabolic Panel:  Recent Labs Lab 03/31/12 0454  NA 134*  K 4.2  CL 100  CO2 24  GLUCOSE 149*  BUN 22  CREATININE 1.62*  CALCIUM 8.3*   Liver Function Tests: No results found for this basename: AST, ALT, ALKPHOS, BILITOT, PROT, ALBUMIN,  in the last 168 hours No results found for this basename: LIPASE, AMYLASE,  in the last 168 hours No results found for this basename: AMMONIA,  in the last 168 hours CBC:  Recent Labs Lab 03/31/12 0454  WBC 9.5  HGB 10.8*  HCT 31.6*  MCV 86.8  PLT 359   Cardiac Enzymes: No results found for this basename: CKTOTAL, CKMB, CKMBINDEX, TROPONINI,  in the last 168 hours BNP: No components found with this basename: POCBNP,  CBG:  Recent Labs Lab 03/30/12 1703 03/30/12 2359 03/31/12 0412 03/31/12 0713 03/31/12 1105  GLUCAP 132* 170* 131* 139* 129*       Scheduled Meds: . amLODipine  10 mg Oral Daily  . atenolol  100 mg Oral Daily  . clonazePAM  0.25 mg Oral BID  . docusate sodium  100 mg Oral BID  . haloperidol  10 mg Oral BID  . insulin aspart  0-15 Units Subcutaneous TID WC  . insulin aspart  0-5 Units Subcutaneous QHS  . OXcarbazepine  450 mg Oral BID  . trihexyphenidyl  5  mg Oral BID WC   Continuous Infusions:    TAT, DAVID, DO  [facility] Pager [phone]  If 7PM-7AM, please contact night-coverage [email or web address] Password [email or web address] 03/31/2012, 1:36 PM   LOS: 1 day

## 2012-03-31 NOTE — Progress Notes (Signed)
Clinical Social Work Department CLINICAL SOCIAL WORK PSYCHIATRY SERVICE LINE ASSESSMENT 03/31/2012  Patient:  David Lang  Account:  [id number]  Admit Date:  03/30/2012  Clinical Social Worker:  David Lightning, LCSW  Date/Time:  03/31/2012 10:30 AM Referred by:  Physician  Date referred:  03/31/2012 Reason for Referral  Behavioral Health Issues   Presenting Symptoms/Problems (In the person's/family's own words):   Psych consulted to determine capacity and due to patient being admitted from [facility]   Abuse/Neglect/Trauma History (check all that apply)  Denies history   Abuse/Neglect/Trauma Comments:   Psychiatric History (check all that apply)  Inpatient/hospitilization   Psychiatric medications:  Klonopin 0.25mg   Haldol 10mg   Ativan 2mg    Current Mental Health Hospitalizations/Previous Mental Health History:   Patient was diagnosed with paranoid schizophrenia when he was a teenager. Patient originally diagnosed in [place] and received treatment there.   Current David Lang:   None   Place and Date:   N/A   Current Medications:   acetaminophen, haloperidol, HYDROcodone-acetaminophen, LORazepam, morphine injection            . amLODipine  10 mg Oral Daily  . atenolol  100 mg Oral Daily  . clonazePAM  0.25 mg Oral BID  . docusate sodium  100 mg Oral BID  . haloperidol  10 mg Oral BID  . insulin aspart  0-15 Units Subcutaneous TID WC  . insulin aspart  0-5 Units Subcutaneous QHS  . OXcarbazepine  450 mg Oral BID  . trihexyphenidyl  5 mg Oral BID WC   Previous Impatient Admission/Date/Reason:   Patient was admitted from [facility]. Patient had previous stay at [facility] in 12/10. Patient's sister also reported that patient was hospitalized in [place].   Emotional Health / Current Symptoms    Suicide/Self Harm  None reported   Suicide attempt in the past:   No current SI or HI.   Other harmful behavior:   Patient refuses hip surgery. Psych MD determined that patient does not have  capacity to understand situation.   Psychotic/Dissociative Symptoms  Disorganized Speech  Inability to care for self  Other - See comment   Other Psychotic/Dissociative Symptoms:   Patient has been at [facility] for almost 1 month. Patient was unable to live independently prior to admission to [facility]. Patient was admitted to [facility] in Feb 2014 due to poor living conditions. Patient is not compliant with medications.    Attention/Behavioral Symptoms  Other - See comment   Other Attention / Behavioral Symptoms:   Patient drowsy throughout assessment.    Cognitive Impairment  Poor/Impaired Decision-Making   Other Cognitive Impairment:   Patient does not have capacity and therefore makes poor decisions regarding health and refusing treatment.    Mood and Adjustment  Lethargic    Stress, Anxiety, Trauma, Any Recent Loss/Stressor  None reported   Anxiety (frequency):   Phobia (specify):   Compulsive behavior (specify):   Obsessive behavior (specify):   Other:   Substance Abuse/Use  None   SBIRT completed (please refer for detailed history):  N  Self-reported substance use:   Patient denies substance use. Patient has been in [facility] for the past 3 weeks.   Urinary Drug Screen Completed:  N Alcohol level:    Environmental/Housing/Living Arrangement  Stable housing   Who is in the home:   [name]   Emergency contact:  Patient has two support sisters but neither live in [place].   Financial  IPRS   Patient's Strengths and Goals (  patient's own words):   Patient has supportive sisters who assist with ensuring patient is receiving care.   Clinical Social Worker's Interpretive Summary:   CSW received referral due to patient being admitted from [facility]. Psych MD has assessment patient and determined he does not have capacity.    CSW is familiar with patient due to previous admission at [facility] CSW entered room and spoke with patient regarding admission.  Patient is very drowsy and unable to properly participate in assessment. Patient reports he fell at [facility] and had to come to [facility]. RN reports that patient needs surgery but unsure when scheduled.    Depending on patient's mobility after surgery, patient might require SNF placement. If patient is mobile then CSW will assist with psychiatric placement if needed.    CSW staffed case with psych MD and will continue to follow to assist with needs.   Disposition:  Recommend Psych CSW continuing to support while in hospital

## 2012-03-31 NOTE — Progress Notes (Addendum)
Patient transferred to ED after fall from wheel chair in [facility], transferred to 5E on hospital bed, alert and noncompliant to Health Care Team, refused medications, IV, and foley catheter in ED and on unit 5E, patient able to frighten with voice, very agitated, patient sustained left hip Fx and surgery is pending, patient smells of urine and stool after transfer from ED, report given to Nurse stated,' that patient refuse to allow them to clean him of stool and urine', consent form not sign at this time, sitter at bedside, patient also refuse to allow VS to be taken in ED and on arrival to unit, Dr notified of patient's arrival to unit, patient in stable condition at this time, only c/o  pain in left hip.

## 2012-03-31 NOTE — Progress Notes (Signed)
INITIAL NUTRITION ASSESSMENT  DOCUMENTATION CODES Per approved criteria  -Not Applicable   INTERVENTION: - Diet advancement per MD, will add Glucerna shakes when diet advanced - Will continue to monitor   NUTRITION DIAGNOSIS: Inadequate oral intake related to inability to eat as evidenced by NPO.    Goal: Advance diet as tolerated to diabetic diet  Monitor:  Weights, labs, diet advancement  Reason for Assessment: Consult  62 y.o. male  Admitting Dx: Left hip fracture   ASSESSMENT: Pt admitted at [facility] by the teaching service on 2/1 for AKI. He was then transferred to [facility] for schizophrenia. Pt was transferred to [facility] after pt fell from his wheelchair at [facility]. Pt asleep and snoring loudly, discussed pt's dietary history with sister over the phone. She reports pt has not been eating well for 1 week PTA. She reports at home pt sometimes eat, sometimes does not. She states pt has lost a lot of weight in the past 6 months however she did not know how much weight he had lost.   Lab Results  Component Value Date   HGBA1C 8.8* 03/22/2012     Height: Ht Readings from Last 1 Encounters:  03/04/12 5' 9.5" (1.765 m)    Weight: Wt Readings from Last 1 Encounters:  03/03/12 180 lb 3 oz (81.733 kg)    Ideal Body Weight: 160 lb  % Ideal Body Weight: 112  Wt Readings from Last 10 Encounters:  03/03/12 180 lb 3 oz (81.733 kg)    Usual Body Weight: Unable to assess  % Usual Body Weight: Unable to assess  BMI:  26.3 kg/(m^2)  Estimated Nutritional Needs: Kcal: 1900-2050 Protein: 85-100g Fluid: 1.9-2L/day  Skin: Small left facial laceration  Diet Order: NPO  EDUCATION NEEDS: -Education not appropriate at this time   Intake/Output Summary (Last 24 hours) at 03/31/12 1626 Last data filed at 03/31/12 1505  Gross per 24 hour  Intake      0 ml  Output    500 ml  Net   -500 ml    Last BM: 3/2  Labs:   Recent Labs Lab 03/31/12 0454  NA 134*  K 4.2  CL  100  CO2 24  BUN 22  CREATININE 1.62*  CALCIUM 8.3*  GLUCOSE 149*    CBG (last 3)   Recent Labs  03/31/12 0412 03/31/12 0713 03/31/12 1105  GLUCAP 131* 139* 129*    Scheduled Meds: . amLODipine  10 mg Oral Daily  . atenolol  100 mg Oral Daily  . clonazePAM  0.25 mg Oral BID  . docusate sodium  100 mg Oral BID  . haloperidol  10 mg Oral BID  . insulin aspart  0-15 Units Subcutaneous TID WC  . insulin aspart  0-5 Units Subcutaneous QHS  . OXcarbazepine  450 mg Oral BID  . trihexyphenidyl  5 mg Oral BID WC    Continuous Infusions: . sodium chloride      Past Medical History  Diagnosis Date  . Hypertension   . Diabetes mellitus without complication   . Mental disorder   . Schizophrenia     History reviewed. No pertinent past surgical history.   Levon Hedger MS, RD, LDN [phone] Pager [phone] After Hours Pager

## 2012-03-31 NOTE — Progress Notes (Signed)
Had intended to do surgery last night but patient declined.  Spoke with his sister who was understanding.  David Lang has agreed to intervention at this point and actually has catheter in place which he has not removed.  Will put on schedule again at this time.  Dr [name] will be surgeon today and he has been versed in the special circumstances at play here.  Hoping for start time around 4pm today.

## 2012-03-31 NOTE — Anesthesia Postprocedure Evaluation (Signed)
  Anesthesia Post-op Note  Patient: David Lang  Procedure(s) Performed: Procedure(s) (LRB): ARTHROPLASTY BIPOLAR HIP (Left)  Patient Location: PACU  Anesthesia Type: General  Level of Consciousness: awake and alert   Airway and Oxygen Therapy: Patient Spontanous Breathing  Post-op Pain: mild  Post-op Assessment: Post-op Vital signs reviewed, Patient's Cardiovascular Status Stable, Respiratory Function Stable, Patent Airway and No signs of Nausea or vomiting  Last Vitals:  Filed Vitals:   03/31/12 1930  BP: 140/70  Pulse: 75  Temp:   Resp: 12    Post-op Vital Signs: stable   Complications: No apparent anesthesia complications

## 2012-03-31 NOTE — Progress Notes (Addendum)
I was asked by Dr. [name] to take over the care of this patient. He is a 62 year old with schizophrenia who has a left displaced femoral neck fracture after a fall. The recommendation is for left hip hemiarthroplasty to try and restore  his ability  to ambulate and decrease pain. He is not able to consent for himself given his significant psychiatric history, but his sister feels this is the best thing for him and has signed his consent. Given his underlying diagnosis he is more likely to have postoperative complications such as infection or dislocation, however I feel that this is the best thing for the patient at this time.

## 2012-03-31 NOTE — Consult Note (Signed)
Patient Identification:  David Lang Date of Evaluation:  03/31/2012 Reason for Consult: Capacity  Referring Provider: Pt fell during transfer from Wheelchair, broke his hip and was complaining of pain. He was transferred to [facility] ED for evaluation and scheduled surgery. Pt refuses surgery. Psychiatric consultation is requested.  Today, Pt' surgery had been delayed.  Pt's sister's needed to be contacted to obtain their permission to repair their brother's fracture of L femur.   Past Psychiatric History: Pt was found several months ago in his home when there was a Tour manager. Pt was brought to [facility] ED where renal problems were discovered. He had refused many tests and treatment. Psychiatric Consultation at that time had determined he did not have capacity. He was transferred to [facility] for his psychiatric treatment For Schizophrenia paranoid type. He has been there until he fell last night.  While at [facility] he has been under IVC. He was under forced medication for his benefit. 03/07/12 for not having the capacity to understand need for treatment. Remote history revealed he had been given medication and was monitored by phone by his sister who had medications mailed to him. She called him daily to remind him of taking his medication. He stopped answering the phone about a year ago. He had not filled any prescription for that period of time.      Past Medical History  Diagnosis Date  . Hypertension   . Diabetes mellitus without complication   . Mental disorder   . Schizophrenia       History reviewed. No pertinent past surgical history.  Allergies: No Known Allergies  Current Medications:  Prior to Admission medications   Not on File    Social History:    reports that he has never smoked. He has never used smokeless tobacco. He reports that he does not drink alcohol or use illicit drugs.   Family History:    History reviewed. No pertinent family history.  Mental Status  Examination/Evaluation: Objective:  Appearance: Casual and Long gray hair, moustache and beard, undernourished.  L foot slightly outward  Eye Contact::  Good  Speech:  Garbled, Slow and very gutteralm requires careful listening; pace rhythm of speech  Volume:  Increased  Mood:  Easily irritable  Affect:  Congruent  Thought Process:  Disorganized  Orientation:  Other:  Oriented to self, place- cannot appreciate need for surgery  Thought Content:  Delusions and Paranoid Ideation  Suicidal Thoughts:  No  Homicidal Thoughts:  No  Judgement:  Impaired  Insight:  Lacking   DIAGNOSIS:   AXIS I  Schizophrenia, paranoid type  AXIS II  Deferred  AXIS III See medical notes.  AXIS IV housing problems, other psychosocial or environmental problems, problems related to social environment, problems with primary support group and patient's siter in [place] and siter in [place] are concerned but have not completed request to obtain power of attorney for medical care  AXIS V 21-30 behavior considerably influenced by delusions or hallucinations OR serious impairment in judgment, communication OR inability to function in almost all areas   Assessment/Plan:  Discussed with Dr. Arbutus Leas, Psych CSW Pt has been transferred from [facility] ED to 1503.  He is lying in bed giving the RN 3rd degre about each pill he is given.  He takes the pills.  He is less querulous this morning.  Surgery is pending when schedule allows,   RECOMMENDATION:  1.  Continue the antipsychotic regimen as surgery allows 2.  Will follow pt. David Lang  David Hart MD 03/31/2012 4:34 PM

## 2012-03-31 NOTE — Op Note (Signed)
Procedure(s): ARTHROPLASTY BIPOLAR HIP Procedure Note  David Lang male 62 y.o. 03/31/2012  Procedure(s) and Anesthesia Type:    *Left hip hemiarthroplasty  Postoperative diagnosis: Left displaced femoral neck fracture  Surgeon(s) and Role:    * Mable Paris, MD - Primary   Indications:  62 y.o. male s/p fall with left hip fracture. Indicated for surgery to promote early ambulation, pain control and prevent complications of bed rest.     Surgeon: Mable Paris   Assistants: Damita Lack PA-C (Danielle was present and scrubbed throughout the procedure and was essential in positioning, retraction, exposure, and closure)  Anesthesia: General endotracheal anesthesia    Procedure Detail  Left hip hemiarthroplasty  Findings: DePuy cemented [place] basic hemiarthroplasty with a size 5 stem and a size 50 head +0 neck length. Excellent stability  Estimated Blood Loss:  300 mL         Drains: none  Blood Given: none          Specimens: none        Complications:  * No complications entered in OR log *         Disposition: PACU - hemodynamically stable.         Condition: stable    Procedure:  The patient was identified in the preoperative  holding area where I personally marked the operative site after  verifying site side and procedure with the patient. He was taken back  to the operating room where general anesthesia was induced without  Complication. The patient was placed in lateral decubitus position with the  left side up. The left lower extremity was then prepped and draped in standard sterile fashion. The patient did receive IV antibiotics prior to the  incision.   After the appropriate time-out, an approximately 12-cm  incision was made over the posterior third of the greater trochanter.  Dissection was carried down to the fascia which was split longitudinally  in line with the incision. The piriformis was tagged and the external   rotators were then taken down off the posterior greater trochanter in 1  sheath with the posterior capsule. The fracture was exposed. Posterior  capsule and external rotators were split just below the piriformis down  to the level of the acetabulum. Great care was taken to protect the  sciatic nerve. The proximal femoral cut was then made using the cut  guide and the femoral head was then removed and sized and felt to be size 50.  The proximal femur was then prepared by first using an intramedullary  canal finder, a lateralizer and then sequentially broaching from 1 to 5.   The size 50 head with 0 offset neck was then placed and a trial reduction was performed. It was felt  to be excellent in terms of the soft tissue tension. I was able to  bring up to 90 degrees of flexion and 70 degrees internal rotation  without any instability. Leg lengths were felt to be appropriate. The  hip was dislocated. The broach was then removed. The cement restrictor  was then placed and the canal was prepared with pulse lavage with a  brush. The size 5 stem was then cemented into place in approximately 15-20  degrees anteversion. It was held until the cement was hardened and  cool. The size 50 +0 offset head was then placed and impacted. It  was then reduced after ensuring that there was nothing in the  acetabulum. The hip reduced nicely and again it  was taken through the  trial. I was able to flex to 90 degrees, internal rotation to 70  without any instability. Soft tissue tension was excellent and lengths  were felt to be equal. The joint was then copiously irrigated with  normal saline with pulse lavage and then the external rotators were  repaired using Ethibond sutures through the greater trochanter and tied  over a bone bridge. The deep fascia was then closed using #1 Vicryl in  running fashion proximally and distally. The skin was then closed using  2-0 Vicryl in deep dermal layer and staples for skin  closure. Sterile  dressings were then applied including ABDs and tape. The patient was  then placed in an abduction pillow, rolled into supine position and  extubated. He was then transferred to the PACU in stable condition.    POSTOPERATIVE PLAN: The patient will be weightbearing as tolerated on the operative Extremity with posterior hip precautions and will have DVT prophylaxis of aspirin and SCDs.

## 2012-03-31 NOTE — Preoperative (Signed)
Beta Blockers   Reason not to administer Beta Blockers:Not Applicable 

## 2012-03-31 NOTE — Transfer of Care (Signed)
Immediate Anesthesia Transfer of Care Note  Patient: David Lang  Procedure(s) Performed: Procedure(s): ARTHROPLASTY BIPOLAR HIP (Left)  Patient Location: PACU  Anesthesia Type:General  Level of Consciousness: sedated  Airway & Oxygen Therapy: Patient Spontanous Breathing and Patient connected to face mask oxygen  Post-op Assessment: Report given to PACU RN and Post -op Vital signs reviewed and stable  Post vital signs: stable  Complications: No apparent anesthesia complications

## 2012-04-01 ENCOUNTER — Inpatient Hospital Stay (HOSPITAL_COMMUNITY): Payer: Medicaid Other

## 2012-04-01 LAB — CBC WITH DIFFERENTIAL/PLATELET
Basophils Absolute: 0.1 10*3/uL (ref 0.0–0.1)
Basophils Relative: 1 % (ref 0–1)
Eosinophils Absolute: 0.2 10*3/uL (ref 0.0–0.7)
Eosinophils Relative: 2 % (ref 0–5)
Lymphocytes Relative: 21 % (ref 12–46)
MCHC: 32.2 g/dL (ref 30.0–36.0)
MCV: 88.7 fL (ref 78.0–100.0)
Platelets: 347 10*3/uL (ref 150–400)
RDW: 13 % (ref 11.5–15.5)
WBC: 11 10*3/uL — ABNORMAL HIGH (ref 4.0–10.5)

## 2012-04-01 LAB — URINALYSIS, ROUTINE W REFLEX MICROSCOPIC
Glucose, UA: NEGATIVE mg/dL
Nitrite: NEGATIVE
Protein, ur: 100 mg/dL — AB

## 2012-04-01 LAB — BASIC METABOLIC PANEL
CO2: 24 mEq/L (ref 19–32)
Chloride: 98 mEq/L (ref 96–112)
Glucose, Bld: 163 mg/dL — ABNORMAL HIGH (ref 70–99)
Potassium: 4.5 mEq/L (ref 3.5–5.1)
Sodium: 133 mEq/L — ABNORMAL LOW (ref 135–145)

## 2012-04-01 LAB — GLUCOSE, CAPILLARY
Glucose-Capillary: 118 mg/dL — ABNORMAL HIGH (ref 70–99)
Glucose-Capillary: 125 mg/dL — ABNORMAL HIGH (ref 70–99)
Glucose-Capillary: 140 mg/dL — ABNORMAL HIGH (ref 70–99)
Glucose-Capillary: 163 mg/dL — ABNORMAL HIGH (ref 70–99)
Glucose-Capillary: 98 mg/dL (ref 70–99)
Glucose-Capillary: 147 mg/dL — ABNORMAL HIGH (ref 70–99)

## 2012-04-01 LAB — URINE MICROSCOPIC-ADD ON

## 2012-04-01 LAB — CBC
Hemoglobin: 10.7 g/dL — ABNORMAL LOW (ref 13.0–17.0)
MCH: 28.2 pg (ref 26.0–34.0)
Platelets: 357 10*3/uL (ref 150–400)
RBC: 3.8 MIL/uL — ABNORMAL LOW (ref 4.22–5.81)
WBC: 10.3 10*3/uL (ref 4.0–10.5)

## 2012-04-01 MED ORDER — GLUCERNA SHAKE PO LIQD
237.0000 mL | Freq: Three times a day (TID) | ORAL | Status: DC
  Administered 2012-04-01 – 2012-04-06 (×11): 237 mL via ORAL
  Filled 2012-04-01 (×21): qty 237

## 2012-04-01 MED ORDER — SODIUM CHLORIDE 0.9 % IV SOLN
INTRAVENOUS | Status: DC
  Administered 2012-04-01 – 2012-04-02 (×2): via INTRAVENOUS

## 2012-04-01 NOTE — Progress Notes (Signed)
Clinical Social Work  Patient had surgery this morning and is back in room and sleeping. CSW will continue to follow to assist with needs but was unable to complete session with patient this morning. Due to hip fracture, patient will possibly need SNF placement. If patient is mobile, psych CSW can assist with psychiatric placement if needed.  [place], [place] [phone]

## 2012-04-01 NOTE — Progress Notes (Signed)
CSW put in level 2 request. Bethany C. Corcoran MSW, LCSW [phone]

## 2012-04-01 NOTE — Progress Notes (Signed)
Subjective: 1 Day Post-Op Procedure(s) (LRB): ARTHROPLASTY BIPOLAR HIP (Left) Patient is resting comfortably. Activity level:  Can be weightbearing as tolerated with physical therapy. Diet tolerance:  ok Voiding:  ok Patient reports pain as 3 on 0-10 scale.    Objective: Vital signs in last 24 hours: Temp:  [97.3 F (36.3 C)-98.9 F (37.2 C)] 98.5 F (36.9 C) (03/07 0502) Pulse Rate:  [60-84] 84 (03/07 0502) Resp:  [12-20] 18 (03/07 0502) BP: (115-161)/(70-85) 155/85 mmHg (03/07 0502) SpO2:  [92 %-97 %] 92 % (03/07 0502) Weight:  [80.74 kg (178 lb)] 80.74 kg (178 lb) (03/06 1500)  Labs:  Recent Labs  03/31/12 0454 04/01/12 0505  HGB 10.8* 10.7*    Recent Labs  03/31/12 0454 04/01/12 0505  WBC 9.5 10.3  RBC 3.64* 3.80*  HCT 31.6* 33.3*  PLT 359 357    Recent Labs  03/31/12 0454 04/01/12 0505  NA 134* 133*  K 4.2 4.5  CL 100 98  CO2 24 24  BUN 22 23  CREATININE 1.62* 1.64*  GLUCOSE 149* 163*  CALCIUM 8.3* 8.2*   No results found for this basename: LABPT, INR,  in the last 72 hours  Physical Exam:  Neurologically intact ABD soft Neurovascular intact Sensation intact distally Intact pulses distally Dorsiflexion/Plantar flexion intact Incision: dressing C/D/I No cellulitis present Compartment soft  Assessment/Plan:  1 Day Post-Op Procedure(s) (LRB): ARTHROPLASTY BIPOLAR HIP (Left) Advance diet Up with therapyand he can be weightbearing as tolerated left side. Continue ASA 3251 pill twice a day for 2 weeks to prevent DVT. We will change dressing in 1-2 days. Discharge per medical team when stable to SNF versus behavioral health.    CARNAGHI,MICHAEL R 04/01/2012, 9:14 AM

## 2012-04-01 NOTE — Progress Notes (Signed)
OT Cancellation Note  Patient Details Name: Dia Jefferys MRN: [id number] DOB: 24-Feb-1950   Cancelled Treatment:    Reason Eval/Treat Not Completed: Other (comment) Pt is moving slowly with PT today.  Will check back another day.    SPENCER,MARYELLEN 04/01/2012, 11:55 AM Marica Otter, OTR/L [phone] 04/01/2012

## 2012-04-01 NOTE — Progress Notes (Signed)
[facility] PROGRESS NOTE  David Lang ZOX:[id number] DOB: 1950/01/27 DOA: 03/30/2012 PCP: Sireen Halk, MD  Assessment/Plan: Left subcapital femoral neck fracture  -Appreciate orthopedics  -Surgery planned for today, 03/31/2012  Hypertension  -continue amlodipine  -Continue atenolol   Paranoid schizophrenia  -I consulted psychiatry to follow patient  -Case discussed with Dr. Ferol Luz (03/31/12) -Patient does not have capacity to make any decisions  -Continue Haldol, Klonopin, Trileptal, Artane as per psychiatry recommendations  Diabetes mellitus type 2  -CBGs controlled - Hemoglobin A1c 8.8 on 03/22/2012  -Continue NovoLog sliding scale  -Patient was not on any medications during his February admission  -Remotely taken metformin  Anemia  -Repeat serum B12 as this was marginally low on 03/22/2012  -Check iron studies-pending -Check RBC folate-pending  Family Communication:   Pt at beside Disposition Plan:   SNF when able to find suitable place   Procedures:  Left hip hemiarthroplasty 03/31/2012       Procedures/Studies: Dg Hip Complete Left  03/30/2012  s*RADIOLOGY REPORT*  Clinical Data: Status post fall; left hip pain.  LEFT HIP - COMPLETE 2+ VIEW  Comparison: None.  Findings: There is a mildly displaced subcapital fracture involving the left femoral neck, with mild rotation of the femoral head.  The left femoral head remains seated at the acetabulum.  The right hip joint is unremarkable in appearance.  The sacroiliac joints are unremarkable in appearance.  The visualized bowel gas pattern is grossly unremarkable.  IMPRESSION: Mildly displaced subcapital fracture involving the left femoral neck.   Original Report Authenticated By: Tonia Ghent, M.D.    Ct Head Wo Contrast  03/30/2012  *RADIOLOGY REPORT*  Clinical Data:  Status post fall; laceration above the left eye. Concern for head or cervical spine injury.  CT HEAD WITHOUT CONTRAST AND CT CERVICAL SPINE  WITHOUT CONTRAST  Technique:  Multidetector CT imaging of the head and cervical spine was performed following the standard protocol without intravenous contrast.  Multiplanar CT image reconstructions of the cervical spine were also generated.  Comparison: None  CT HEAD  Findings: There is no evidence of acute infarction, mass lesion, or intra- or extra-axial hemorrhage on CT.  Prominence of the sulci suggests mild cortical volume loss. Relatively diffuse periventricular and subcortical white matter change likely reflects small vessel ischemic microangiopathy. Cerebellar atrophy is noted.  A small chronic lacunar infarct is noted at the anterior right thalamus.  Chronic ischemic change is seen at the external capsule bilaterally.  The brainstem and fourth ventricle are within normal limits.  The cerebral hemispheres demonstrate grossly normal gray-white differentiation.  No mass effect or midline shift is seen.   There is no evidence of fracture; visualized osseous structures are unremarkable in appearance.  The orbits are within normal limits.  The paranasal sinuses and mastoid air cells are well- aerated.  The known soft tissue laceration is not well characterized on CT.  IMPRESSION:  1.  No evidence of traumatic intracranial injury or fracture. 2.  Mild cortical volume loss and diffuse small vessel ischemic microangiopathy. 3.  Small chronic infarct at the anterior right thalamus, and chronic ischemic change at the external capsule bilaterally.  CT CERVICAL SPINE  Findings: There is no evidence of fracture or subluxation. Vertebral bodies demonstrate normal height and alignment. Intervertebral disc spaces are preserved.  Small anterior disc osteophyte complexes are noted along the cervical spine. Prevertebral soft tissues are within normal limits.  The visualized neural foramina are grossly unremarkable.  The thyroid gland is unremarkable in appearance.  Diffuse interstitial prominence is noted at the lung apices,  with small bilateral pleural effusions, concerning for pulmonary edema.  Mild calcification is noted along the left vertebral artery at the level of C2.  IMPRESSION:  1.  No evidence of fracture or subluxation along the cervical spine. 2.  Diffuse interstitial prominence of the lung apices, with small bilateral pleural effusions, concerning for pulmonary edema.   Original Report Authenticated By: Tonia Ghent, M.D.    Ct Cervical Spine Wo Contrast  03/30/2012  *RADIOLOGY REPORT*  Clinical Data:  Status post fall; laceration above the left eye. Concern for head or cervical spine injury.  CT HEAD WITHOUT CONTRAST AND CT CERVICAL SPINE WITHOUT CONTRAST  Technique:  Multidetector CT imaging of the head and cervical spine was performed following the standard protocol without intravenous contrast.  Multiplanar CT image reconstructions of the cervical spine were also generated.  Comparison: None  CT HEAD  Findings: There is no evidence of acute infarction, mass lesion, or intra- or extra-axial hemorrhage on CT.  Prominence of the sulci suggests mild cortical volume loss. Relatively diffuse periventricular and subcortical white matter change likely reflects small vessel ischemic microangiopathy. Cerebellar atrophy is noted.  A small chronic lacunar infarct is noted at the anterior right thalamus.  Chronic ischemic change is seen at the external capsule bilaterally.  The brainstem and fourth ventricle are within normal limits.  The cerebral hemispheres demonstrate grossly normal gray-white differentiation.  No mass effect or midline shift is seen.   There is no evidence of fracture; visualized osseous structures are unremarkable in appearance.  The orbits are within normal limits.  The paranasal sinuses and mastoid air cells are well- aerated.  The known soft tissue laceration is not well characterized on CT.  IMPRESSION:  1.  No evidence of traumatic intracranial injury or fracture. 2.  Mild cortical volume loss and  diffuse small vessel ischemic microangiopathy. 3.  Small chronic infarct at the anterior right thalamus, and chronic ischemic change at the external capsule bilaterally.  CT CERVICAL SPINE  Findings: There is no evidence of fracture or subluxation. Vertebral bodies demonstrate normal height and alignment. Intervertebral disc spaces are preserved.  Small anterior disc osteophyte complexes are noted along the cervical spine. Prevertebral soft tissues are within normal limits.  The visualized neural foramina are grossly unremarkable.  The thyroid gland is unremarkable in appearance.  Diffuse interstitial prominence is noted at the lung apices, with small bilateral pleural effusions, concerning for pulmonary edema.  Mild calcification is noted along the left vertebral artery at the level of C2.  IMPRESSION:  1.  No evidence of fracture or subluxation along the cervical spine. 2.  Diffuse interstitial prominence of the lung apices, with small bilateral pleural effusions, concerning for pulmonary edema.   Original Report Authenticated By: Tonia Ghent, M.D.    Dg Pelvis Portable  03/31/2012  *RADIOLOGY REPORT*  Clinical Data: Postop  PORTABLE PELVIS  Comparison: 03/30/2012.  Findings: Portable film demonstrates satisfactory appearance status post left hemiarthroplasty.  IMPRESSION: Satisfactory position and alignment.   Original Report Authenticated By: Davonna Belling, M.D.    Dg Chest Port 1 View  03/30/2012  *RADIOLOGY REPORT*  Clinical Data: Preoperative chest radiograph.  PORTABLE CHEST - 1 VIEW  Comparison: Chest radiograph performed 02/26/2012  Findings: The lungs are well-aerated.  Vascular congestion is noted.  Bilateral central and bibasilar airspace opacities may reflect mild pulmonary edema.  There is no pleural effusion or pneumothorax.  The cardiomediastinal silhouette is borderline normal in  size.  No acute osseous abnormalities are seen.  IMPRESSION: Vascular congestion noted.  Bilateral central and  bibasilar airspace opacities may reflect mild pulmonary edema.  This may be transient, due to the patient's recent fall.   Original Report Authenticated By: Tonia Ghent, M.D.          Subjective: Patient worked with physical therapy today and was cooperative. He is sleepy currently after his medicines, but awakens to voice. No reports of chest pain, respiratory distress, vomiting, diarrhea.  Objective: Filed Vitals:   03/31/12 2316 04/01/12 0014 04/01/12 0400 04/01/12 0502  BP:  158/73  155/85  Pulse:  73  84  Temp:  97.3 F (36.3 C)  98.5 F (36.9 C)  TempSrc:  Axillary  Oral  Resp: 18 18 18 18   Height:      Weight:      SpO2: 94% 93% 93% 92%    Intake/Output Summary (Last 24 hours) at 04/01/12 1420 Last data filed at 04/01/12 0500  Gross per 24 hour  Intake   1800 ml  Output    980 ml  Net    820 ml   Weight change:  Exam:   General:  Pt is alert, follows commands appropriately, not in acute distress  HEENT: No icterus, No thrush, /AT  Cardiovascular: RRR, S1/S2, no rubs, no gallops  Respiratory: CTA bilaterally, no wheezing, no crackles, no rhonchi  Abdomen: Soft/+BS, non tender, non distended, no guarding  Extremities: trace edema, No lymphangitis, No petechiae, No rashes, no synovitis  Data Reviewed: Basic Metabolic Panel:  Recent Labs Lab 03/31/12 0454 04/01/12 0505  NA 134* 133*  K 4.2 4.5  CL 100 98  CO2 24 24  GLUCOSE 149* 163*  BUN 22 23  CREATININE 1.62* 1.64*  CALCIUM 8.3* 8.2*   Liver Function Tests: No results found for this basename: AST, ALT, ALKPHOS, BILITOT, PROT, ALBUMIN,  in the last 168 hours No results found for this basename: LIPASE, AMYLASE,  in the last 168 hours No results found for this basename: AMMONIA,  in the last 168 hours CBC:  Recent Labs Lab 03/31/12 0454 04/01/12 0505  WBC 9.5 10.3  HGB 10.8* 10.7*  HCT 31.6* 33.3*  MCV 86.8 87.6  PLT 359 357   Cardiac Enzymes: No results found for this basename:  CKTOTAL, CKMB, CKMBINDEX, TROPONINI,  in the last 168 hours BNP: No components found with this basename: POCBNP,  CBG:  Recent Labs Lab 03/31/12 1729 03/31/12 1931 03/31/12 2307 04/01/12 0751 04/01/12 1114  GLUCAP 100* 118* 125* 140* 163*       Scheduled Meds: . amLODipine  10 mg Oral Daily  . aspirin EC  325 mg Oral BID  . atenolol  100 mg Oral Daily  . clonazePAM  0.25 mg Oral BID  . docusate sodium  100 mg Oral BID  . feeding supplement  237 mL Oral TID BM  . haloperidol  10 mg Oral BID  . insulin aspart  0-15 Units Subcutaneous TID WC  . insulin aspart  0-5 Units Subcutaneous QHS  . OXcarbazepine  450 mg Oral BID  . trihexyphenidyl  5 mg Oral BID WC   Continuous Infusions: . sodium chloride       TAT, DAVID, DO  [facility] Pager [phone]  If 7PM-7AM, please contact night-coverage [email or web address] Password [email or web address] 04/01/2012, 2:20 PM   LOS: 2 days

## 2012-04-01 NOTE — Progress Notes (Signed)
MD paged - Pt oral temp 102. Tylenol administered. Foley removed 1500 today pt has yet to void. Bladder Scan shows 233 ml.

## 2012-04-01 NOTE — Progress Notes (Signed)
Dr. [name] is aware of pt temp 102 and inability to void.  Incision is clean with minimal red drainage- no oozing or malodor noted.  In and out cath performed.  Dr. [name] suggested encouraging incentive spirometry and coughing and deep breathing.  With patient receiving scheduled Haldol, pt remains somewhat difficult to arouse. Dr. [name] suggest decreasing or discontinuing scheduled Haldol so patient is able to breath more effectively.

## 2012-04-01 NOTE — Progress Notes (Signed)
UR completed 

## 2012-04-01 NOTE — Evaluation (Signed)
Physical Therapy Evaluation Patient Details Name: David Lang MRN: [id number] DOB: 1950-05-19 Today's Date: 04/01/2012 Time: 1016-1030 PT Time Calculation (min): 14 min  PT Assessment / Plan / Recommendation Clinical Impression  Pt admitted after fall during w/c transfer at [facility] sustaining hip fx and now currently s/p L hip hemiarthroplasty.  Pt with limited evaluation due to cognition (?pain meds vs psych hx), lethargy and pain.  Pt would benefit from acute PT services in order to improve independence with transfers and ambulation to prepare pt for d/c to next venue.    PT Assessment  Patient needs continued PT services    Follow Up Recommendations  Supervision/Assistance - 24 hour;SNF    Does the patient have the potential to tolerate intense rehabilitation      Barriers to Discharge        Equipment Recommendations  Rolling walker with 5" wheels;Wheelchair (measurements PT)    Recommendations for Other Services     Frequency Min 5X/week    Precautions / Restrictions Precautions Precautions: Posterior Hip;Fall Restrictions Weight Bearing Restrictions: No Other Position/Activity Restrictions: WBAT L LE   Pertinent Vitals/Pain Premedicated per sitter, pain with L LE movement      Mobility  Bed Mobility Bed Mobility: Supine to Sit;Sit to Supine Supine to Sit: 1: +2 Total assist Supine to Sit: Patient Percentage: 0% Sit to Supine: 1: +2 Total assist Sit to Supine: Patient Percentage: 10% Details for Bed Mobility Assistance: verbal cues for technique, pt not assisting as much after pain trying to move L LE so required +2, also with poor cognition today Transfers Transfers: Sit to Stand;Stand to Sit Sit to Stand: 1: +2 Total assist;From bed;From elevated surface Sit to Stand: Patient Percentage: 30% Stand to Sit: 1: +2 Total assist;To bed;To elevated surface Stand to Sit: Patient Percentage: 30% Details for Transfer Assistance: kept hands on RW due to poor cognition,  increased assist for weakness and pain, pt with increased knee flexion with standing Ambulation/Gait Ambulation/Gait Assistance: Not tested (comment) (not safe to ambulate today due to lethargy and cognition)    Exercises     PT Diagnosis: Acute pain;Difficulty walking  PT Problem List: Decreased strength;Decreased mobility;Decreased balance;Decreased knowledge of precautions;Decreased safety awareness;Decreased knowledge of use of DME;Pain PT Treatment Interventions: DME instruction;Gait training;Functional mobility training;Therapeutic activities;Therapeutic exercise;Patient/family education   PT Goals Acute Rehab PT Goals PT Goal Formulation: Patient unable to participate in goal setting Time For Goal Achievement: 04/08/12 Potential to Achieve Goals: Good Pt will go Supine/Side to Sit: with supervision PT Goal: Supine/Side to Sit - Progress: Goal set today Pt will go Sit to Supine/Side: with supervision PT Goal: Sit to Supine/Side - Progress: Goal set today Pt will go Sit to Stand: with supervision PT Goal: Sit to Stand - Progress: Goal set today Pt will go Stand to Sit: with supervision PT Goal: Stand to Sit - Progress: Goal set today Pt will Ambulate: 51 - 150 feet;with min assist;with rolling walker PT Goal: Ambulate - Progress: Goal set today Pt will Perform Home Exercise Program: with supervision, verbal cues required/provided PT Goal: Perform Home Exercise Program - Progress: Goal set today  Visit Information  Last PT Received On: 04/01/12 Assistance Needed: +2    Subjective Data  Subjective: I need to pay you.   Prior Functioning  Home Living Additional Comments: Pt admitted from [facility] and will need SNF upon d/c. Prior Function Comments: unable to obtain as pt with poor cognition Communication Communication: Other (comment) (difficult to understand speech)  Cognition  Cognition Overall Cognitive Status: Impaired Area of Impairment: Safety/judgement;Following  commands;Awareness of deficits Arousal/Alertness: Lethargic Orientation Level: Disoriented to;Place;Time;Situation Behavior During Session: Lethargic Following Commands: Follows one step commands inconsistently Safety/Judgement: Decreased awareness of need for assistance;Decreased awareness of safety precautions;Decreased safety judgement for tasks assessed Cognition - Other Comments: pt premedicated and also trying to get OOB prior to meds per sitter    Extremity/Trunk Assessment Right Lower Extremity Assessment RLE ROM/Strength/Tone: Unable to fully assess;Due to impaired cognition RLE ROM/Strength/Tone Deficits: able to perform active knee extension and PF/DF so at least 3/5 Left Lower Extremity Assessment LLE ROM/Strength/Tone: Unable to fully assess;Due to precautions;Due to impaired cognition LLE ROM/Strength/Tone Deficits: able to perform active knee extension and PF/DF so at least 3/5   Balance    End of Session PT - End of Session Activity Tolerance: Patient limited by fatigue;Patient limited by pain;Other (comment) Patient left: in bed;with call bell/phone within reach;Other (comment) (with sitter) Nurse Communication: Weight bearing status;Precautions (sitter aware and left note on board)  GP     LEMYRE,KATHrine E 04/01/2012, 12:51 PM Zenovia Jarred, PT, DPT 04/01/2012 Pager: [phone]

## 2012-04-02 LAB — CBC
MCH: 28.5 pg (ref 26.0–34.0)
MCHC: 32.5 g/dL (ref 30.0–36.0)
Platelets: 331 10*3/uL (ref 150–400)

## 2012-04-02 LAB — GLUCOSE, CAPILLARY: Glucose-Capillary: 96 mg/dL (ref 70–99)

## 2012-04-02 LAB — BASIC METABOLIC PANEL
Calcium: 7.8 mg/dL — ABNORMAL LOW (ref 8.4–10.5)
GFR calc non Af Amer: 34 mL/min — ABNORMAL LOW (ref >90)
Sodium: 134 mEq/L — ABNORMAL LOW (ref 135–145)

## 2012-04-02 MED ORDER — PIPERACILLIN-TAZOBACTAM 3.375 G IVPB
3.3750 g | Freq: Three times a day (TID) | INTRAVENOUS | Status: DC
  Administered 2012-04-02 – 2012-04-04 (×6): 3.375 g via INTRAVENOUS
  Filled 2012-04-02 (×8): qty 50

## 2012-04-02 MED ORDER — VANCOMYCIN HCL IN DEXTROSE 1-5 GM/200ML-% IV SOLN
1000.0000 mg | INTRAVENOUS | Status: DC
  Administered 2012-04-02 – 2012-04-03 (×2): 1000 mg via INTRAVENOUS
  Filled 2012-04-02 (×3): qty 200

## 2012-04-02 MED ORDER — PIPERACILLIN-TAZOBACTAM 3.375 G IVPB 30 MIN
3.3750 g | Freq: Three times a day (TID) | INTRAVENOUS | Status: DC
  Filled 2012-04-02 (×2): qty 50

## 2012-04-02 MED ORDER — SODIUM CHLORIDE 0.9 % IV SOLN
INTRAVENOUS | Status: DC
  Administered 2012-04-02 – 2012-04-03 (×2): via INTRAVENOUS

## 2012-04-02 NOTE — Progress Notes (Signed)
Physical Therapy Treatment Patient Details Name: David Lang MRN: [id number] DOB: 06/22/1950 Today's Date: 04/02/2012 Time: 1478-2956 PT Time Calculation (min): 28 min  PT Assessment / Plan / Recommendation Comments on Treatment Session  Pt stood at EOB x 2 with +2 assist.  Ambulation not attempted 2* assist level required for sit to stand and pt's current cognitive status    Follow Up Recommendations  Supervision/Assistance - 24 hour;SNF     Does the patient have the potential to tolerate intense rehabilitation     Barriers to Discharge        Equipment Recommendations  Rolling walker with 5" wheels;Wheelchair (measurements PT)    Recommendations for Other Services    Frequency Min 5X/week   Plan Discharge plan remains appropriate    Precautions / Restrictions Precautions Precautions: Posterior Hip;Fall Precaution Booklet Issued: Yes (comment) Precaution Comments: Sign posted in room Restrictions Weight Bearing Restrictions: No Other Position/Activity Restrictions: WBAT   Pertinent Vitals/Pain Pt states it "does not hurt too bad" with there ex; premedicated    Mobility  Bed Mobility Bed Mobility: Supine to Sit;Sit to Supine Supine to Sit: 1: +2 Total assist Supine to Sit: Patient Percentage: 20% Sit to Supine: 1: +2 Total assist Sit to Supine: Patient Percentage: 10% Details for Bed Mobility Assistance: cues for sequence, and use of R LE to self assist Transfers Transfers: Sit to Stand;Stand to Sit Sit to Stand: 1: +2 Total assist;From bed;From elevated surface Sit to Stand: Patient Percentage: 40% Stand to Sit: 1: +2 Total assist;To bed;To elevated surface Stand to Sit: Patient Percentage: 30% Details for Transfer Assistance: Cues for use of UEs to self assist. difficulty attaining erect posture with full knee ext Ambulation/Gait Ambulation/Gait Assistance: Not tested (comment)    Exercises Total Joint Exercises Ankle Circles/Pumps: AAROM;15  reps;Supine;Both Heel Slides: AAROM;10 reps;Supine;Left Hip ABduction/ADduction: AAROM;Left;10 reps;Supine   PT Diagnosis:    PT Problem List:   PT Treatment Interventions:     PT Goals Acute Rehab PT Goals PT Goal Formulation: Patient unable to participate in goal setting Time For Goal Achievement: 04/08/12 Potential to Achieve Goals: Good Pt will go Supine/Side to Sit: with supervision PT Goal: Supine/Side to Sit - Progress: Progressing toward goal Pt will go Sit to Supine/Side: with supervision PT Goal: Sit to Supine/Side - Progress: Progressing toward goal Pt will go Sit to Stand: with supervision PT Goal: Sit to Stand - Progress: Progressing toward goal Pt will go Stand to Sit: with supervision PT Goal: Stand to Sit - Progress: Progressing toward goal Pt will Ambulate: 51 - 150 feet;with min assist;with rolling walker PT Goal: Ambulate - Progress: Not progressing Pt will Perform Home Exercise Program: with supervision, verbal cues required/provided PT Goal: Perform Home Exercise Program - Progress: Progressing toward goal  Visit Information  Last PT Received On: 04/02/12 Assistance Needed: +2    Subjective Data  Subjective: I want to walk Patient Stated Goal: I want to walk   Cognition  Cognition Overall Cognitive Status: Impaired Area of Impairment: Safety/judgement;Following commands;Awareness of deficits Arousal/Alertness: Lethargic Orientation Level: Disoriented to;Place;Time;Situation Behavior During Session: Lethargic Following Commands: Follows one step commands inconsistently Safety/Judgement: Decreased awareness of need for assistance;Decreased awareness of safety precautions;Decreased safety judgement for tasks assessed    Balance     End of Session PT - End of Session Equipment Utilized During Treatment: Gait belt Activity Tolerance: Patient limited by fatigue;Patient limited by pain Patient left: in bed;with call bell/phone within reach;Other  (comment) Nurse Communication: Weight bearing status;Precautions  GP     BRADSHAW,HUNTER 04/02/2012, 5:13 PM

## 2012-04-02 NOTE — Progress Notes (Signed)
[facility] PROGRESS NOTE  Isack Lavalley MVH:[id number] DOB: 09-Jun-1950 DOA: 03/30/2012 PCP: Default, Provider, MD  Assessment/Plan: Fever/leukocytosis -Temperature 102.67F,  04/01/2012 evening -WBC increased 11.8 (04/02/2012) -Blood cultures already obtained -Urinalysis does not suggest UTI -Chest x-ray shows increasing right lower lobe opacity suggesting pneumonia -Start Zosyn and vancomycin and follow cultures Somnolence -Likely due to the new infectious process -Hold Haldol and Klonopin HCAP/Hypoxemia -Blood cultures are obtained -Empiric vancomycin and Zosyn Left subcapital femoral neck fracture  -Appreciate orthopedics  -Left hip hemiarthroplasty, 03/31/2012  Hyperkalemia -IV fluids, normal saline -Follow BMP Renal insufficiency -Renal ultrasound -Patient had serum creatinine 1.71 on 02/26/2012 -Check urine sodium, urine creatinine -Foley catheter placed again for urinary retention, bladder scan only showed 233cc -Foley catheter was discontinued earlier in the day 04/01/2012 Hypertension  -Hold on amlodipine for now blood pressure is a little soft this morning -Continue atenolol  Paranoid schizophrenia  -I consulted psychiatry to follow patient  -Case discussed with Dr. Ferol Luz (03/31/12)  -Patient does not have capacity to make any decisions  -Continue Haldol, Klonopin, Trileptal, Artane as per psychiatry recommendations  Diabetes mellitus type 2  -CBGs controlled  - Hemoglobin A1c 8.8 on 03/22/2012  -Continue NovoLog sliding scale  -Patient was not on any medications during his February admission  -Remotely taken metformin  Anemia  -Repeat serum B12 as this was marginally low on 03/22/2012  -Check iron studies-pending  -Check RBC folate-pending  Family Communication: Pt at beside  Disposition Plan: SNF when able to find suitable place    Antibiotics:  Vancomycin/Zosyn 04/02/2012>>>    Procedures/Studies: Dg Hip Complete Left  03/30/2012  s*RADIOLOGY  REPORT*  Clinical Data: Status post fall; left hip pain.  LEFT HIP - COMPLETE 2+ VIEW  Comparison: None.  Findings: There is a mildly displaced subcapital fracture involving the left femoral neck, with mild rotation of the femoral head.  The left femoral head remains seated at the acetabulum.  The right hip joint is unremarkable in appearance.  The sacroiliac joints are unremarkable in appearance.  The visualized bowel gas pattern is grossly unremarkable.  IMPRESSION: Mildly displaced subcapital fracture involving the left femoral neck.   Original Report Authenticated By: Tonia Ghent, M.D.    Ct Head Wo Contrast  03/30/2012  *RADIOLOGY REPORT*  Clinical Data:  Status post fall; laceration above the left eye. Concern for head or cervical spine injury.  CT HEAD WITHOUT CONTRAST AND CT CERVICAL SPINE WITHOUT CONTRAST  Technique:  Multidetector CT imaging of the head and cervical spine was performed following the standard protocol without intravenous contrast.  Multiplanar CT image reconstructions of the cervical spine were also generated.  Comparison: None  CT HEAD  Findings: There is no evidence of acute infarction, mass lesion, or intra- or extra-axial hemorrhage on CT.  Prominence of the sulci suggests mild cortical volume loss. Relatively diffuse periventricular and subcortical white matter change likely reflects small vessel ischemic microangiopathy. Cerebellar atrophy is noted.  A small chronic lacunar infarct is noted at the anterior right thalamus.  Chronic ischemic change is seen at the external capsule bilaterally.  The brainstem and fourth ventricle are within normal limits.  The cerebral hemispheres demonstrate grossly normal gray-white differentiation.  No mass effect or midline shift is seen.   There is no evidence of fracture; visualized osseous structures are unremarkable in appearance.  The orbits are within normal limits.  The paranasal sinuses and mastoid air cells are well- aerated.  The known  soft tissue laceration is not well characterized on  CT.  IMPRESSION:  1.  No evidence of traumatic intracranial injury or fracture. 2.  Mild cortical volume loss and diffuse small vessel ischemic microangiopathy. 3.  Small chronic infarct at the anterior right thalamus, and chronic ischemic change at the external capsule bilaterally.  CT CERVICAL SPINE  Findings: There is no evidence of fracture or subluxation. Vertebral bodies demonstrate normal height and alignment. Intervertebral disc spaces are preserved.  Small anterior disc osteophyte complexes are noted along the cervical spine. Prevertebral soft tissues are within normal limits.  The visualized neural foramina are grossly unremarkable.  The thyroid gland is unremarkable in appearance.  Diffuse interstitial prominence is noted at the lung apices, with small bilateral pleural effusions, concerning for pulmonary edema.  Mild calcification is noted along the left vertebral artery at the level of C2.  IMPRESSION:  1.  No evidence of fracture or subluxation along the cervical spine. 2.  Diffuse interstitial prominence of the lung apices, with small bilateral pleural effusions, concerning for pulmonary edema.   Original Report Authenticated By: Tonia Ghent, M.D.    Ct Cervical Spine Wo Contrast  03/30/2012  *RADIOLOGY REPORT*  Clinical Data:  Status post fall; laceration above the left eye. Concern for head or cervical spine injury.  CT HEAD WITHOUT CONTRAST AND CT CERVICAL SPINE WITHOUT CONTRAST  Technique:  Multidetector CT imaging of the head and cervical spine was performed following the standard protocol without intravenous contrast.  Multiplanar CT image reconstructions of the cervical spine were also generated.  Comparison: None  CT HEAD  Findings: There is no evidence of acute infarction, mass lesion, or intra- or extra-axial hemorrhage on CT.  Prominence of the sulci suggests mild cortical volume loss. Relatively diffuse periventricular and subcortical  white matter change likely reflects small vessel ischemic microangiopathy. Cerebellar atrophy is noted.  A small chronic lacunar infarct is noted at the anterior right thalamus.  Chronic ischemic change is seen at the external capsule bilaterally.  The brainstem and fourth ventricle are within normal limits.  The cerebral hemispheres demonstrate grossly normal gray-white differentiation.  No mass effect or midline shift is seen.   There is no evidence of fracture; visualized osseous structures are unremarkable in appearance.  The orbits are within normal limits.  The paranasal sinuses and mastoid air cells are well- aerated.  The known soft tissue laceration is not well characterized on CT.  IMPRESSION:  1.  No evidence of traumatic intracranial injury or fracture. 2.  Mild cortical volume loss and diffuse small vessel ischemic microangiopathy. 3.  Small chronic infarct at the anterior right thalamus, and chronic ischemic change at the external capsule bilaterally.  CT CERVICAL SPINE  Findings: There is no evidence of fracture or subluxation. Vertebral bodies demonstrate normal height and alignment. Intervertebral disc spaces are preserved.  Small anterior disc osteophyte complexes are noted along the cervical spine. Prevertebral soft tissues are within normal limits.  The visualized neural foramina are grossly unremarkable.  The thyroid gland is unremarkable in appearance.  Diffuse interstitial prominence is noted at the lung apices, with small bilateral pleural effusions, concerning for pulmonary edema.  Mild calcification is noted along the left vertebral artery at the level of C2.  IMPRESSION:  1.  No evidence of fracture or subluxation along the cervical spine. 2.  Diffuse interstitial prominence of the lung apices, with small bilateral pleural effusions, concerning for pulmonary edema.   Original Report Authenticated By: Tonia Ghent, M.D.    Dg Pelvis Portable  03/31/2012  *RADIOLOGY REPORT*  Clinical  Data: Postop  PORTABLE PELVIS  Comparison: 03/30/2012.  Findings: Portable film demonstrates satisfactory appearance status post left hemiarthroplasty.  IMPRESSION: Satisfactory position and alignment.   Original Report Authenticated By: Davonna Belling, M.D.    Dg Chest Port 1 View  04/01/2012  *RADIOLOGY REPORT*  Clinical Data: Fever.  PORTABLE CHEST - 1 VIEW  Comparison: Chest radiograph performed 03/30/2012  Findings: The lungs are mildly hypoexpanded.  Vascular congestion is noted, with mild increased interstitial markings, raise concern for mild pulmonary edema.  This is grossly stable from the prior study.  There is no evidence of pleural effusion or pneumothorax.  The cardiomediastinal silhouette is borderline normal in size.  No acute osseous abnormalities are seen.  IMPRESSION: Lungs mildly hypoexpanded.  Vascular congestion, with mildly increased interstitial markings, raising concern for mild pulmonary edema.  This appears relatively stable from the recent prior study.   Original Report Authenticated By: Tonia Ghent, M.D.    Dg Chest Port 1 View  03/30/2012  *RADIOLOGY REPORT*  Clinical Data: Preoperative chest radiograph.  PORTABLE CHEST - 1 VIEW  Comparison: Chest radiograph performed 02/26/2012  Findings: The lungs are well-aerated.  Vascular congestion is noted.  Bilateral central and bibasilar airspace opacities may reflect mild pulmonary edema.  There is no pleural effusion or pneumothorax.  The cardiomediastinal silhouette is borderline normal in size.  No acute osseous abnormalities are seen.  IMPRESSION: Vascular congestion noted.  Bilateral central and bibasilar airspace opacities may reflect mild pulmonary edema.  This may be transient, due to the patient's recent fall.   Original Report Authenticated By: Tonia Ghent, M.D.          Subjective: Patient was more somnolent last night and this morning. However he arouses easily. He is more awake this morning. Total was held for last  night and this morning. Patient denies any chest pain or shortness of breath. No reports of vomiting, respiratory distress, diarrhea. Patient is pleasantly confused.  Objective: Filed Vitals:   04/02/12 0350 04/02/12 0649 04/02/12 0800 04/02/12 1059  BP:  133/75  115/63  Pulse:  69  66  Temp:  98.6 F (37 C)  98.3 F (36.8 C)  TempSrc:  Oral  Oral  Resp: 18 18 14 16   Height:      Weight:      SpO2: 94% 96%  95%    Intake/Output Summary (Last 24 hours) at 04/02/12 1138 Last data filed at 04/02/12 0800  Gross per 24 hour  Intake 1746.67 ml  Output    425 ml  Net 1321.67 ml   Weight change:  Exam:   General:  Pt is alert, follows commands appropriately but only intermittently, not in acute distress  HEENT: No icterus, No thrush,  Coker/AT  Cardiovascular: RRR, S1/S2, no rubs, no gallops  Respiratory: Diminished breath sounds at the bases with bibasilar crackles. No wheezes or rhonchi. Good air movement.  Abdomen: Soft/+BS, non tender, non distended, no guarding  Extremities: No edema, No lymphangitis, No petechiae, No rashes, no synovitis; left hip incision without any drainage, necrosis. Minimal erythema at the incision site.  Data Reviewed: Basic Metabolic Panel:  Recent Labs Lab 03/31/12 0454 04/01/12 0505 04/02/12 0530  NA 134* 133* 134*  K 4.2 4.5 5.4*  CL 100 98 102  CO2 24 24 21   GLUCOSE 149* 163* 111*  BUN 22 23 26*  CREATININE 1.62* 1.64* 1.99*  CALCIUM 8.3* 8.2* 7.8*   Liver Function Tests: No results found for this basename: AST, ALT,  ALKPHOS, BILITOT, PROT, ALBUMIN,  in the last 168 hours No results found for this basename: LIPASE, AMYLASE,  in the last 168 hours No results found for this basename: AMMONIA,  in the last 168 hours CBC:  Recent Labs Lab 03/31/12 0454 04/01/12 0505 04/01/12 2315 04/02/12 0530  WBC 9.5 10.3 11.0* 11.8*  NEUTROABS  --   --  6.8  --   HGB 10.8* 10.7* 9.1* 9.3*  HCT 31.6* 33.3* 28.3* 28.6*  MCV 86.8 87.6 88.7  87.7  PLT 359 357 347 331   Cardiac Enzymes: No results found for this basename: CKTOTAL, CKMB, CKMBINDEX, TROPONINI,  in the last 168 hours BNP: No components found with this basename: POCBNP,  CBG:  Recent Labs Lab 04/01/12 1114 04/01/12 1646 04/01/12 2054 04/02/12 0730 04/02/12 1115  GLUCAP 163* 98 147* 96 122*    No results found for this or any previous visit (from the past 240 hour(s)).   Scheduled Meds: . aspirin EC  325 mg Oral BID  . atenolol  100 mg Oral Daily  . clonazePAM  0.25 mg Oral BID  . docusate sodium  100 mg Oral BID  . feeding supplement  237 mL Oral TID BM  . haloperidol  10 mg Oral BID  . insulin aspart  0-15 Units Subcutaneous TID WC  . insulin aspart  0-5 Units Subcutaneous QHS  . OXcarbazepine  450 mg Oral BID  . trihexyphenidyl  5 mg Oral BID WC   Continuous Infusions: . sodium chloride       TAT, DAVID, DO  [facility] Pager [phone]  If 7PM-7AM, please contact night-coverage [email or web address] Password [facility] 04/02/2012, 11:38 AM   LOS: 3 days

## 2012-04-02 NOTE — Progress Notes (Signed)
PATIENT ID: David Lang   2 Days Post-Op Procedure(s) (LRB): ARTHROPLASTY BIPOLAR HIP (Left)  Subjective: The patient has been somnolent and is maintained on scheduled Haldol. He had a fever overnight.  Objective:  Filed Vitals:   04/02/12 1059  BP: 115/63  Pulse: 66  Temp: 98.3 F (36.8 C)  Resp: 16     Examination of the left hip shows the dressing to be clean dry and intact. He is somewhat somnolent still. Minimally interactive.  Labs:   Recent Labs  03/31/12 0454 04/01/12 0505 04/01/12 2315 04/02/12 0530  HGB 10.8* 10.7* 9.1* 9.3*   Recent Labs  04/01/12 2315 04/02/12 0530  WBC 11.0* 11.8*  RBC 3.19* 3.26*  HCT 28.3* 28.6*  PLT 347 331   Recent Labs  04/01/12 0505 04/02/12 0530  NA 133* 134*  K 4.5 5.4*  CL 98 102  CO2 24 21  BUN 23 26*  CREATININE 1.64* 1.99*  GLUCOSE 163* 111*  CALCIUM 8.2* 7.8*    Assessment and Plan: Status post left hip hemiarthroplasty with fever postoperative day #2 Fever most likely do to atelectasis, secondary to his somnolence and inability to perform adequate pulmonary toilet. Every effort should be made to minimize sedating medications, understanding that this is a balance to control his psychiatric symptoms. Encourage pulmonary toilet. Continue weightbearing as tolerated with posterior hip precautions.  VTE proph: Aspirin twice daily and SCDs

## 2012-04-02 NOTE — Progress Notes (Signed)
ANTIBIOTIC CONSULT NOTE - INITIAL  Pharmacy Consult for vancomycin Indication: HAP  No Known Allergies  Patient Measurements: Height: 5' 9.5" (176.5 cm) Weight: 178 lb (80.74 kg) IBW/kg (Calculated) : 71.85  Vital Signs: Temp: 98.3 F (36.8 C) (03/08 1059) Temp src: Oral (03/08 1059) BP: 115/63 mmHg (03/08 1059) Pulse Rate: 66 (03/08 1059) Intake/Output from previous day: 03/07 0701 - 03/08 0700 In: 1626.7 [P.O.:390; I.V.:1236.7] Out: 425 [Urine:425] Intake/Output from this shift: Total I/O In: 360 [P.O.:360] Out: -   Labs:  Recent Labs  03/31/12 0454 04/01/12 0505 04/01/12 2315 04/02/12 0530  WBC 9.5 10.3 11.0* 11.8*  HGB 10.8* 10.7* 9.1* 9.3*  PLT 359 357 347 331  CREATININE 1.62* 1.64*  --  1.99*   Estimated Creatinine Clearance: 39.1 ml/min (by C-G formula based on Cr of 1.99). No results found for this basename: VANCOTROUGH, VANCOPEAK, VANCORANDOM, GENTTROUGH, GENTPEAK, GENTRANDOM, TOBRATROUGH, TOBRAPEAK, TOBRARND, AMIKACINPEAK, AMIKACINTROU, AMIKACIN,  in the last 72 hours   Microbiology: No results found for this or any previous visit (from the past 720 hour(s)).  Medical History: Past Medical History  Diagnosis Date  . Hypertension   . Diabetes mellitus without complication   . Mental disorder   . Schizophrenia     Medications:  Scheduled:  . aspirin EC  325 mg Oral BID  . atenolol  100 mg Oral Daily  . clonazePAM  0.25 mg Oral BID  . docusate sodium  100 mg Oral BID  . feeding supplement  237 mL Oral TID BM  . haloperidol  10 mg Oral BID  . insulin aspart  0-15 Units Subcutaneous TID WC  . insulin aspart  0-5 Units Subcutaneous QHS  . OXcarbazepine  450 mg Oral BID  . piperacillin-tazobactam (ZOSYN)  IV  3.375 g Intravenous Q8H  . trihexyphenidyl  5 mg Oral BID WC  . [DISCONTINUED] amLODipine  10 mg Oral Daily  . [DISCONTINUED] piperacillin-tazobactam  3.375 g Intravenous Q8H   Infusions:  . sodium chloride 75 mL/hr at 04/02/12 1148  .  [DISCONTINUED] sodium chloride 100 mL/hr at 04/02/12 1191   Assessment:  62 yo male admitted 3/5 with broken hip after fall s/p L hip hemiathroplasty on 3/6  Now with suspected HAP, fever to start vancomycin/Zosyn  CrCl 39 ml/min  Tmax 102 3/7 PM  Goal of Therapy:  Vancomycin trough level 15-20 mcg/ml  Plan:  1) Vancomycin 1g IV q24 based on current CrCl and weight 2) Vanc trough at steady state prn   Hessie Knows, PharmD, BCPS Pager [phone] 04/02/2012 12:13 PM

## 2012-04-03 ENCOUNTER — Inpatient Hospital Stay (HOSPITAL_COMMUNITY): Payer: Medicaid Other

## 2012-04-03 LAB — BASIC METABOLIC PANEL
BUN: 24 mg/dL — ABNORMAL HIGH (ref 6–23)
Calcium: 8.4 mg/dL (ref 8.4–10.5)
Chloride: 97 mEq/L (ref 96–112)
Creatinine, Ser: 1.64 mg/dL — ABNORMAL HIGH (ref 0.50–1.35)
GFR calc Af Amer: 50 mL/min — ABNORMAL LOW (ref >90)

## 2012-04-03 LAB — GLUCOSE, CAPILLARY
Glucose-Capillary: 107 mg/dL — ABNORMAL HIGH (ref 70–99)
Glucose-Capillary: 117 mg/dL — ABNORMAL HIGH (ref 70–99)

## 2012-04-03 LAB — CBC
HCT: 32 % — ABNORMAL LOW (ref 39.0–52.0)
MCH: 28.5 pg (ref 26.0–34.0)
MCV: 88.6 fL (ref 78.0–100.0)
Platelets: 435 10*3/uL — ABNORMAL HIGH (ref 150–400)
RDW: 12.7 % (ref 11.5–15.5)
WBC: 12.8 10*3/uL — ABNORMAL HIGH (ref 4.0–10.5)

## 2012-04-03 LAB — URINE CULTURE

## 2012-04-03 LAB — MAGNESIUM: Magnesium: 2.2 mg/dL (ref 1.5–2.5)

## 2012-04-03 MED ORDER — HYDRALAZINE HCL 20 MG/ML IJ SOLN
5.0000 mg | Freq: Four times a day (QID) | INTRAMUSCULAR | Status: DC | PRN
  Administered 2012-04-03 – 2012-04-04 (×2): 5 mg via INTRAVENOUS
  Filled 2012-04-03 (×2): qty 1

## 2012-04-03 NOTE — Evaluation (Signed)
Occupational Therapy Evaluation Patient Details Name: David Lang MRN: [id number] DOB: 10-07-1950 Today's Date: 04/03/2012 Time: 7846-9629 OT Time Calculation (min): 22 min  OT Assessment / Plan / Recommendation Clinical Impression  This 62 yo male admitted s/p fall from W/C at [facility] with resultant left hip fracture and now s/p LTHA (posterior). Pt from [place] (moved to [place] in the 1980's), diagnosed with mood disorder there and since coming to states he has been diagnosed with schizophrenia. Pt presents to acute OT confused and with increased need for A. Will benefit from acute OT with follow up OT at SNF.     OT Assessment  Patient needs continued OT Services    Follow Up Recommendations  SNF    Barriers to Discharge Decreased caregiver support    Equipment Recommendations  3 in 1 bedside comode       Frequency  Min 2X/week    Precautions / Restrictions Precautions Precautions: Posterior Hip;Fall Restrictions Weight Bearing Restrictions: No Other Position/Activity Restrictions: WBAT       ADL  Eating/Feeding: Simulated;Set up;Supervision/safety Where Assessed - Eating/Feeding: Bed level Grooming: Simulated;Supervision/safety;Set up Where Assessed - Grooming: Unsupported sitting Upper Body Bathing: Simulated;Set up;Supervision/safety Where Assessed - Upper Body Bathing: Unsupported sitting Lower Body Bathing: Simulated;Maximal assistance (with +2 sit to stand) Where Assessed - Lower Body Bathing: Unsupported sitting Upper Body Dressing: Simulated;Maximal assistance Where Assessed - Upper Body Dressing: Supported sitting Lower Body Dressing: Simulated;+1 Total assistance (with +2 sit to stand ) Where Assessed - Lower Body Dressing: Unsupported sitting Transfers/Ambulation Related to ADLs: Did not attempt due to no +2 A    OT Diagnosis: Generalized weakness;Cognitive deficits  OT Problem List: Decreased strength;Impaired balance (sitting and/or standing);Decreased  cognition;Decreased safety awareness;Decreased knowledge of use of DME or AE;Decreased knowledge of precautions OT Treatment Interventions: Self-care/ADL training;DME and/or AE instruction;Therapeutic activities;Cognitive remediation/compensation;Patient/family education;Balance training   OT Goals Acute Rehab OT Goals OT Goal Formulation: Patient unable to participate in goal setting Time For Goal Achievement: 04/17/12 Potential to Achieve Goals: Fair ADL Goals Pt Will Perform Grooming: Standing at sink;Unsupported (min guard A, at least one task) ADL Goal: Grooming - Progress: Goal set today Pt Will Transfer to Toilet: with min assist;Stand pivot transfer;Ambulation;3-in-1;Maintaining hip precautions ADL Goal: Toilet Transfer - Progress: Goal set today Pt Will Perform Toileting - Clothing Manipulation: with min assist;Standing ADL Goal: Toileting - Clothing Manipulation - Progress: Goal set today Pt Will Perform Toileting - Hygiene: with min assist;Sit to stand from 3-in-1/toilet ADL Goal: Toileting - Hygiene - Progress: Goal set today Miscellaneous OT Goals Miscellaneous OT Goal #1: Pt will be min A to get to EOB for BADLs and transfers OT Goal: Miscellaneous Goal #1 - Progress: Goal set today Miscellaneous OT Goal #2: Pt will be mIn A sit to stand from bed for transfers OT Goal: Miscellaneous Goal #2 - Progress: Goal set today  Visit Information  Last OT Received On: 04/03/12 Assistance Needed: +2 (If OOB)    Subjective Data  Subjective: "Give me some water and 2 aspirins for blood clots" Nurse was in room and told him that he had already had them this AM, however pt did not remember and thus did not believe him due to patient's confusion   Prior Functioning     Home Living Additional Comments: Pt admitted from [facility] and will need SNF upon d/c. Prior Function Comments: unable to obtain due to pt's decreased cognition Communication Communication:  (difficult to understand  speech due to accent and gruff voice)  Cognition  Cognition Overall Cognitive Status: Impaired Area of Impairment: Safety/judgement;Following commands;Memory Arousal/Alertness: Awake/alert Behavior During Session: Lethargic Memory: Decreased recall of precautions Memory Deficits: Could not remember that he had already had his aspirin Following Commands: Follows one step commands inconsistently;Follows one step commands with increased time    Extremity/Trunk Assessment Right Upper Extremity Assessment RUE ROM/Strength/Tone: Within functional levels Left Upper Extremity Assessment LUE ROM/Strength/Tone: Within functional levels     Mobility Bed Mobility Bed Mobility: Supine to Sit;Sitting - Scoot to Edge of Bed Supine to Sit: 2: Max assist;HOB elevated Sitting - Scoot to [place] of Bed: 1: +1 Total assist (use of bed pad) Sit to Supine: Patient Percentage: 10% Details for Bed Mobility Assistance: Over with legs first to the left hand side of the bed, then up with trunk           End of Session OT - End of Session Activity Tolerance: Treatment limited secondary to agitation Patient left: in bed;with bed alarm set;with call bell/phone within reach Nurse Communication:  (Nurse in room assisting me with pt back to supine)       Evette Georges [phone] 04/03/2012, 4:08 PM

## 2012-04-03 NOTE — Progress Notes (Signed)
PATIENT ID: David Lang   3 Days Post-Op Procedure(s) (LRB): ARTHROPLASTY BIPOLAR HIP (Left)  Subjective: Mental status much improved today. Much more alert and awake. Low-grade temperature overnight.  Objective:  Filed Vitals:   04/03/12 0744  BP: 179/84  Pulse: 82  Temp:   Resp:      Left hip dressing changed. Incision clean dry and intact. Distally neurovascularly intact. Knee immobilizer intact.  Labs:   Recent Labs  04/01/12 0505 04/01/12 2315 04/02/12 0530 04/03/12 0506  HGB 10.7* 9.1* 9.3* 10.3*   Recent Labs  04/02/12 0530 04/03/12 0506  WBC 11.8* 12.8*  RBC 3.26* 3.61*  HCT 28.6* 32.0*  PLT 331 435*   Recent Labs  04/02/12 0530 04/03/12 0506  NA 134* 132*  K 5.4* 4.5  CL 102 97  CO2 21 23  BUN 26* 24*  CREATININE 1.99* 1.64*  GLUCOSE 111* 132*  CALCIUM 7.8* 8.4    Assessment and Plan: Postoperative day 3 status post left hip hemiarthroplasty for fracture Encourage incentive spirometry and up out of bed with physical therapy when able. Weightbearing as tolerated with posterior hip precautions.  VTE proph: Aspirin twice daily with SCDs.

## 2012-04-03 NOTE — Progress Notes (Signed)
[facility] PROGRESS NOTE  David Lang WJX:[id number] DOB: [birth year] DOA: 03/30/2012 PCP: Maitland Lesiak, MD  Assessment/Plan: Fever/leukocytosis  -Temperature 102.42F, 04/01/2012 evening  -Temperature trending down since the antibiotics -WBC increased 11.8 (04/02/2012)  -Blood cultures already obtained--neg to date -Urinalysis does not suggest UTI  -Chest x-ray shows increasing right lower lobe opacity suggesting pneumonia  -Start Zosyn and vancomycin and follow cultures  Somnolence  -Likely due to the new infectious process  -Hold Haldol and Klonopin  -More alert since starting antibiotics for infectious process -Haldol and Klonopin have been restarted HCAP/Hypoxemia  -Blood cultures are obtained  -Empiric vancomycin and Zosyn  Left subcapital femoral neck fracture  -Appreciate orthopedics  -Left hip hemiarthroplasty, 03/31/2012  Hyperkalemia  -IV fluids, normal saline  -Follow BMP  Renal insufficiency  -Renal ultrasound--result pending  -Patient had serum creatinine 1.71 on 02/26/2012  -Check urine sodium, urine creatinine  -Foley catheter placed again for urinary retention, bladder scan only showed 233cc  -Foley catheter was discontinued earlier in the day 04/01/2012  Hypertension  -Hold on amlodipine for now blood pressure is a little soft this morning  -Continue atenolol  Paranoid schizophrenia  -I consulted psychiatry to follow patient  -Case discussed with Dr. Ferol Luz (03/31/12)  -Patient does not have capacity to make any decisions  -Continue Haldol, Klonopin, Trileptal, Artane as per psychiatry recommendations  Diabetes mellitus type 2  -CBGs controlled  - Hemoglobin A1c 8.8 on 03/22/2012  -Continue NovoLog sliding scale  -Patient was not on any medications during his February admission  -Remotely taken metformin  Anemia  -Repeat serum B12 as this was marginally low on 03/22/2012  -Check iron studies-pending  -Check RBC folate-pending  Family  Communication: Pt at beside  Disposition Plan: SNF when able to find suitable place  Antibiotics:  Vancomycin/Zosyn 04/02/2012>>>        Procedures/Studies: Dg Hip Complete Left  03/30/2012  s*RADIOLOGY REPORT*  Clinical Data: Status post fall; left hip pain.  LEFT HIP - COMPLETE 2+ VIEW  Comparison: None.  Findings: There is a mildly displaced subcapital fracture involving the left femoral neck, with mild rotation of the femoral head.  The left femoral head remains seated at the acetabulum.  The right hip joint is unremarkable in appearance.  The sacroiliac joints are unremarkable in appearance.  The visualized bowel gas pattern is grossly unremarkable.  IMPRESSION: Mildly displaced subcapital fracture involving the left femoral neck.   Original Report Authenticated By: Tonia Ghent, M.D.    Ct Head Wo Contrast  03/30/2012  *RADIOLOGY REPORT*  Clinical Data:  Status post fall; laceration above the left eye. Concern for head or cervical spine injury.  CT HEAD WITHOUT CONTRAST AND CT CERVICAL SPINE WITHOUT CONTRAST  Technique:  Multidetector CT imaging of the head and cervical spine was performed following the standard protocol without intravenous contrast.  Multiplanar CT image reconstructions of the cervical spine were also generated.  Comparison: None  CT HEAD  Findings: There is no evidence of acute infarction, mass lesion, or intra- or extra-axial hemorrhage on CT.  Prominence of the sulci suggests mild cortical volume loss. Relatively diffuse periventricular and subcortical white matter change likely reflects small vessel ischemic microangiopathy. Cerebellar atrophy is noted.  A small chronic lacunar infarct is noted at the anterior right thalamus.  Chronic ischemic change is seen at the external capsule bilaterally.  The brainstem and fourth ventricle are within normal limits.  The cerebral hemispheres demonstrate grossly normal gray-white differentiation.  No mass effect or midline shift is  seen.   There is no evidence of fracture; visualized osseous structures are unremarkable in appearance.  The orbits are within normal limits.  The paranasal sinuses and mastoid air cells are well- aerated.  The known soft tissue laceration is not well characterized on CT.  IMPRESSION:  1.  No evidence of traumatic intracranial injury or fracture. 2.  Mild cortical volume loss and diffuse small vessel ischemic microangiopathy. 3.  Small chronic infarct at the anterior right thalamus, and chronic ischemic change at the external capsule bilaterally.  CT CERVICAL SPINE  Findings: There is no evidence of fracture or subluxation. Vertebral bodies demonstrate normal height and alignment. Intervertebral disc spaces are preserved.  Small anterior disc osteophyte complexes are noted along the cervical spine. Prevertebral soft tissues are within normal limits.  The visualized neural foramina are grossly unremarkable.  The thyroid gland is unremarkable in appearance.  Diffuse interstitial prominence is noted at the lung apices, with small bilateral pleural effusions, concerning for pulmonary edema.  Mild calcification is noted along the left vertebral artery at the level of C2.  IMPRESSION:  1.  No evidence of fracture or subluxation along the cervical spine. 2.  Diffuse interstitial prominence of the lung apices, with small bilateral pleural effusions, concerning for pulmonary edema.   Original Report Authenticated By: Tonia Ghent, M.D.    Ct Cervical Spine Wo Contrast  03/30/2012  *RADIOLOGY REPORT*  Clinical Data:  Status post fall; laceration above the left eye. Concern for head or cervical spine injury.  CT HEAD WITHOUT CONTRAST AND CT CERVICAL SPINE WITHOUT CONTRAST  Technique:  Multidetector CT imaging of the head and cervical spine was performed following the standard protocol without intravenous contrast.  Multiplanar CT image reconstructions of the cervical spine were also generated.  Comparison: None  CT HEAD   Findings: There is no evidence of acute infarction, mass lesion, or intra- or extra-axial hemorrhage on CT.  Prominence of the sulci suggests mild cortical volume loss. Relatively diffuse periventricular and subcortical white matter change likely reflects small vessel ischemic microangiopathy. Cerebellar atrophy is noted.  A small chronic lacunar infarct is noted at the anterior right thalamus.  Chronic ischemic change is seen at the external capsule bilaterally.  The brainstem and fourth ventricle are within normal limits.  The cerebral hemispheres demonstrate grossly normal gray-white differentiation.  No mass effect or midline shift is seen.   There is no evidence of fracture; visualized osseous structures are unremarkable in appearance.  The orbits are within normal limits.  The paranasal sinuses and mastoid air cells are well- aerated.  The known soft tissue laceration is not well characterized on CT.  IMPRESSION:  1.  No evidence of traumatic intracranial injury or fracture. 2.  Mild cortical volume loss and diffuse small vessel ischemic microangiopathy. 3.  Small chronic infarct at the anterior right thalamus, and chronic ischemic change at the external capsule bilaterally.  CT CERVICAL SPINE  Findings: There is no evidence of fracture or subluxation. Vertebral bodies demonstrate normal height and alignment. Intervertebral disc spaces are preserved.  Small anterior disc osteophyte complexes are noted along the cervical spine. Prevertebral soft tissues are within normal limits.  The visualized neural foramina are grossly unremarkable.  The thyroid gland is unremarkable in appearance.  Diffuse interstitial prominence is noted at the lung apices, with small bilateral pleural effusions, concerning for pulmonary edema.  Mild calcification is noted along the left vertebral artery at the level of C2.  IMPRESSION:  1.  No evidence of  fracture or subluxation along the cervical spine. 2.  Diffuse interstitial  prominence of the lung apices, with small bilateral pleural effusions, concerning for pulmonary edema.   Original Report Authenticated By: Tonia Ghent, M.D.    Dg Pelvis Portable  03/31/2012  *RADIOLOGY REPORT*  Clinical Data: Postop  PORTABLE PELVIS  Comparison: 03/30/2012.  Findings: Portable film demonstrates satisfactory appearance status post left hemiarthroplasty.  IMPRESSION: Satisfactory position and alignment.   Original Report Authenticated By: Davonna Belling, M.D.    Dg Chest Port 1 View  04/01/2012  *RADIOLOGY REPORT*  Clinical Data: Fever.  PORTABLE CHEST - 1 VIEW  Comparison: Chest radiograph performed 03/30/2012  Findings: The lungs are mildly hypoexpanded.  Vascular congestion is noted, with mild increased interstitial markings, raise concern for mild pulmonary edema.  This is grossly stable from the prior study.  There is no evidence of pleural effusion or pneumothorax.  The cardiomediastinal silhouette is borderline normal in size.  No acute osseous abnormalities are seen.  IMPRESSION: Lungs mildly hypoexpanded.  Vascular congestion, with mildly increased interstitial markings, raising concern for mild pulmonary edema.  This appears relatively stable from the recent prior study.   Original Report Authenticated By: Tonia Ghent, M.D.    Dg Chest Port 1 View  03/30/2012  *RADIOLOGY REPORT*  Clinical Data: Preoperative chest radiograph.  PORTABLE CHEST - 1 VIEW  Comparison: Chest radiograph performed 02/26/2012  Findings: The lungs are well-aerated.  Vascular congestion is noted.  Bilateral central and bibasilar airspace opacities may reflect mild pulmonary edema.  There is no pleural effusion or pneumothorax.  The cardiomediastinal silhouette is borderline normal in size.  No acute osseous abnormalities are seen.  IMPRESSION: Vascular congestion noted.  Bilateral central and bibasilar airspace opacities may reflect mild pulmonary edema.  This may be transient, due to the patient's recent fall.    Original Report Authenticated By: Tonia Ghent, M.D.          Subjective: Patient is more alert today. He denies any chest pain, shortness breath, abdominal pain, headache. The one episode of vomiting today. 2 large stools today. No diarrhea noted to  Objective: Filed Vitals:   04/03/12 0600 04/03/12 0658 04/03/12 0744 04/03/12 1425  BP: 173/79 173/77 179/84 166/77  Pulse: 84 83 82 77  Temp: 100.2 F (37.9 C) 99.8 F (37.7 C)  98.7 F (37.1 C)  TempSrc: Oral Oral  Oral  Resp: 20     Height:      Weight:      SpO2: 96%  93% 98%    Intake/Output Summary (Last 24 hours) at 04/03/12 1614 Last data filed at 04/03/12 1300  Gross per 24 hour  Intake    360 ml  Output   1975 ml  Net  -1615 ml   Weight change:  Exam:   General:  Pt is alert, follows commands appropriately, not in acute distress  HEENT: No icterus, No thrush,[place]/AT  Cardiovascular: RRR, S1/S2, no rubs, no gallops  Respiratory: Ms. breath sounds at bases. Bibasilar crackles, right greater than left. No wheezes or rhonchi.  Abdomen: Soft/+BS, non tender, non distended, no guarding  Extremities: trace edema, No lymphangitis, No petechiae, No rashes, no synovitis  Data Reviewed: Basic Metabolic Panel:  Recent Labs Lab 03/31/12 0454 04/01/12 0505 04/02/12 0530 04/03/12 0506  NA 134* 133* 134* 132*  K 4.2 4.5 5.4* 4.5  CL 100 98 102 97  CO2 24 24 21 23   GLUCOSE 149* 163* 111* 132*  BUN 22 23 26* 24*  CREATININE 1.62* 1.64* 1.99* 1.64*  CALCIUM 8.3* 8.2* 7.8* 8.4  MG  --   --   --  2.2  PHOS  --   --   --  3.3   Liver Function Tests: No results found for this basename: AST, ALT, ALKPHOS, BILITOT, PROT, ALBUMIN,  in the last 168 hours No results found for this basename: LIPASE, AMYLASE,  in the last 168 hours No results found for this basename: AMMONIA,  in the last 168 hours CBC:  Recent Labs Lab 03/31/12 0454 04/01/12 0505 04/01/12 2315 04/02/12 0530 04/03/12 0506  WBC 9.5 10.3  11.0* 11.8* 12.8*  NEUTROABS  --   --  6.8  --   --   HGB 10.8* 10.7* 9.1* 9.3* 10.3*  HCT 31.6* 33.3* 28.3* 28.6* 32.0*  MCV 86.8 87.6 88.7 87.7 88.6  PLT 359 357 347 331 435*   Cardiac Enzymes: No results found for this basename: CKTOTAL, CKMB, CKMBINDEX, TROPONINI,  in the last 168 hours BNP: No components found with this basename: POCBNP,  CBG:  Recent Labs Lab 04/02/12 1115 04/02/12 1655 04/02/12 2112 04/03/12 0744 04/03/12 1110  GLUCAP 122* 117* 126* 107* 124*    Recent Results (from the past 240 hour(s))  URINE CULTURE     Status: None   Collection Time    04/01/12 11:09 PM      Result Value Range Status   Specimen Description URINE, CATHETERIZED   Final   Special Requests NONE   Final   Culture  Setup Time 04/02/2012 03:05   Final   [place] Count NO GROWTH   Final   Culture NO GROWTH   Final   Report Status 04/03/2012 FINAL   Final     Scheduled Meds: . aspirin EC  325 mg Oral BID  . atenolol  100 mg Oral Daily  . clonazePAM  0.25 mg Oral BID  . docusate sodium  100 mg Oral BID  . feeding supplement  237 mL Oral TID BM  . haloperidol  10 mg Oral BID  . insulin aspart  0-15 Units Subcutaneous TID WC  . insulin aspart  0-5 Units Subcutaneous QHS  . OXcarbazepine  450 mg Oral BID  . piperacillin-tazobactam (ZOSYN)  IV  3.375 g Intravenous Q8H  . trihexyphenidyl  5 mg Oral BID WC  . vancomycin  1,000 mg Intravenous Q24H   Continuous Infusions: . sodium chloride 75 mL/hr at 04/03/12 1309     TAT, DAVID, DO  [facility] Pager [phone]  If 7PM-7AM, please contact night-coverage [email or web address] Password [email or web address] 04/03/2012, 4:14 PM   LOS: 4 days

## 2012-04-04 ENCOUNTER — Encounter (HOSPITAL_COMMUNITY): Payer: Self-pay | Admitting: Orthopedic Surgery

## 2012-04-04 LAB — CBC
MCHC: 32.5 g/dL (ref 30.0–36.0)
Platelets: 490 10*3/uL — ABNORMAL HIGH (ref 150–400)
RDW: 12.7 % (ref 11.5–15.5)

## 2012-04-04 LAB — VITAMIN B12: Vitamin B-12: 276 pg/mL (ref 211–911)

## 2012-04-04 LAB — BASIC METABOLIC PANEL
BUN: 21 mg/dL (ref 6–23)
GFR calc Af Amer: 62 mL/min — ABNORMAL LOW (ref >90)
GFR calc non Af Amer: 53 mL/min — ABNORMAL LOW (ref >90)
Potassium: 4.2 mEq/L (ref 3.5–5.1)
Sodium: 135 mEq/L (ref 135–145)

## 2012-04-04 LAB — IRON AND TIBC
Saturation Ratios: 6 % — ABNORMAL LOW (ref 20–55)
TIBC: 194 ug/dL — ABNORMAL LOW (ref 215–435)

## 2012-04-04 LAB — FERRITIN: Ferritin: 198 ng/mL (ref 22–322)

## 2012-04-04 MED ORDER — VITAMIN B-12 1000 MCG PO TABS
1000.0000 ug | ORAL_TABLET | Freq: Every day | ORAL | Status: DC
  Administered 2012-04-05 – 2012-04-18 (×11): 1000 ug via ORAL
  Filled 2012-04-04 (×15): qty 1

## 2012-04-04 MED ORDER — HALOPERIDOL 5 MG PO TABS
5.0000 mg | ORAL_TABLET | Freq: Once | ORAL | Status: DC
  Filled 2012-04-04: qty 1

## 2012-04-04 MED ORDER — HALOPERIDOL LACTATE 5 MG/ML IJ SOLN
5.0000 mg | Freq: Four times a day (QID) | INTRAMUSCULAR | Status: DC | PRN
  Administered 2012-04-05 – 2012-04-06 (×2): 5 mg via INTRAMUSCULAR
  Filled 2012-04-04 (×3): qty 1

## 2012-04-04 MED ORDER — VANCOMYCIN HCL 1000 MG IV SOLR
750.0000 mg | Freq: Two times a day (BID) | INTRAVENOUS | Status: DC
  Filled 2012-04-04 (×5): qty 750

## 2012-04-04 MED ORDER — LEVOFLOXACIN 750 MG PO TABS
750.0000 mg | ORAL_TABLET | Freq: Every day | ORAL | Status: DC
  Administered 2012-04-05 – 2012-04-10 (×6): 750 mg via ORAL
  Filled 2012-04-04 (×7): qty 1

## 2012-04-04 MED ORDER — FERROUS SULFATE 325 (65 FE) MG PO TABS
325.0000 mg | ORAL_TABLET | Freq: Two times a day (BID) | ORAL | Status: DC
  Administered 2012-04-05 – 2012-04-18 (×21): 325 mg via ORAL
  Filled 2012-04-04 (×29): qty 1

## 2012-04-04 MED ORDER — PANTOPRAZOLE SODIUM 40 MG PO TBEC
40.0000 mg | DELAYED_RELEASE_TABLET | Freq: Every day | ORAL | Status: DC
  Administered 2012-04-04 – 2012-04-18 (×15): 40 mg via ORAL
  Filled 2012-04-04 (×16): qty 1

## 2012-04-04 MED ORDER — AMLODIPINE BESYLATE 5 MG PO TABS
5.0000 mg | ORAL_TABLET | Freq: Every day | ORAL | Status: DC
  Administered 2012-04-05 – 2012-04-06 (×2): 5 mg via ORAL
  Filled 2012-04-04 (×4): qty 1

## 2012-04-04 NOTE — Progress Notes (Signed)
PATIENT ID: David Lang   4 Days Post-Op Procedure(s) (LRB): ARTHROPLASTY BIPOLAR HIP (Left)  Subjective: Conversing in short sentences, awake and alert. Denies pain.   Objective:  Filed Vitals:   04/04/12 0600  BP: 185/87  Pulse: 80  Temp: 99.1 F (37.3 C)  Resp: 18     Awake, alert, converses in short phrases L LE incision appears benign, staples intact Some ecchymosis superior to incision Mild swelling, no erythema, warmth Knee immobilizer intact  Labs:   Recent Labs  04/01/12 2315 04/02/12 0530 04/03/12 0506 04/04/12 0445  HGB 9.1* 9.3* 10.3* 10.2*   Recent Labs  04/03/12 0506 04/04/12 0445  WBC 12.8* 12.7*  RBC 3.61* 3.58*  HCT 32.0* 31.4*  PLT 435* 490*   Recent Labs  04/03/12 0506 04/04/12 0445  NA 132* 135  K 4.5 4.2  CL 97 100  CO2 23 25  BUN 24* 21  CREATININE 1.64* 1.38*  GLUCOSE 132* 128*  CALCIUM 8.4 8.3*    Assessment and Plan: P/o day 4 left hemiarthroplasty for fracture WBAT with posterior hip precautions, maintain knee immobilizer Temperature now WNL after abx Encourage out of bed with PT when able Plan to d/c to SNF when medically stable per medicine team  VTE proph: SCDs, ASA 325mg  BID

## 2012-04-04 NOTE — Progress Notes (Signed)
04-04-12   14:45     IV Team Note;   Called earlier to restart iv; pt needing Haldol, per RN ;   Pt attimately refusing iv restart;  2 techs, as well as pt's RN at bedside ;   RN has spoken to MD;    Will hold iv restart at this time as pt is quite agitated.    Barkley Bruns RN [facility], IV Team

## 2012-04-04 NOTE — Progress Notes (Signed)
Received report from nursing staff that patient put out his IV earlier today. Patient has become increasingly belligerent and interfering with medical therapy today. Although patient is not combative, he continues to admit her with medical therapy. The patient refuses restart his intravenous access. In addition he refuses oral medications. Patient becomes angry and belligerent when trying to reason with him. I spoke with psychiatry, Dr. Mickeal Skinner. I expressed concerns regarding continued medical therapy as well as for psychiatric therapy. Currently the patient is refusing further psychiatric medications. In addition, psychiatry does not feel that patient is an appropriate candidate to go back to behavioral health at this time. Dr. Ferol Luz referred me to be forced medication protocol.  Dr. Ferol Luz was not familiar with the protocol.  I asked Dr. Ferol Luz to please come assess patient regarding the patient's psychiatric condition and needs.  I also spoke with the Ethics committee representatitve who was not unfamiliar with the forced medication protocol.  I also contacted risk management and discussed the situation.    DTat

## 2012-04-04 NOTE — Progress Notes (Signed)
[facility] PROGRESS NOTE  David Lang ZOX:[id number] DOB: 1950-08-16 DOA: 03/30/2012 PCP: Default, Provider, MD  Assessment/Plan: Fever/leukocytosis  -Temperature 102.68F, 04/01/2012 evening  -Temperature trending down since the antibiotics  -now afebrile for past 24 hours (3/9-3/10) -WBC increased 11.8 (04/02/2012) but stable -Blood cultures already obtained--neg to date  -Urinalysis does not suggest UTI  -Chest x-ray shows increasing right lower lobe opacity suggesting pneumonia  -Start Zosyn and vancomycin (04/02/12) -Patient was increasingly agitated and belligerent today and pulled out his IV--> start patient on levofloxacin and discontinued IV antibiotics and observe Somnolence  -Resolved-->Patient was increasingly agitated and belligerent today and pulled out his IV and refuses other medications and medical therapy -Likely due to the new infectious process-->HCAP -Hold Haldol and Klonopin  -More alert since starting antibiotics for infectious process  -Haldol and Klonopin have been restarted, but the patient has been refusing oral medications HCAP/Hypoxemia  -Blood cultures are obtained--negative to date -Empiric vancomycin and Zosyn--> switch to Levaquin as discussed above (04/04/2012) Left subcapital femoral neck fracture  -Appreciate orthopedics  -Left hip hemiarthroplasty, 03/31/2012  Hyperkalemia  -IV fluids, normal saline  -Follow BMP  Renal insufficiency  -Renal ultrasound--result pending  -Patient had serum creatinine 1.71 on 02/26/2012  -It appears as the baseline maybe between 1.3-1.6 -Foley catheter placed again for urinary retention, bladder scan only showed 233cc-->d/c foley 04/04/12 as the patient was increasingly irritated by the Foley catheter and pulling on his catheter causing hematuria -Foley catheter was discontinued earlier in the day 04/01/2012  Hypertension  -Hold on amlodipine for now blood pressure is a little soft this morning--> restore  amlodipine his blood pressure is increasing (04/05/2038) -Continue atenolol  Paranoid schizophrenia  -I consulted psychiatry to follow patient  -Case discussed with Dr. Ferol Luz regarding patient's increasing agitation, belligerence, and refusal to take medications. I have asked her to reevaluate the patient -Haldol IM for aggitation for now -Patient does not have capacity to make any decisions  -Continue Haldol, Klonopin, Trileptal, Artane as per psychiatry recommendations  Diabetes mellitus type 2  -CBGs controlled--today pt refusing CBGs - Hemoglobin A1c 8.8 on 03/22/2012  -Continue NovoLog sliding scale  -Patient was not on any medications during his February admission  -Remotely taken metformin  Anemia  -Repeat serum B12 as this was marginally low on 03/22/2012--> repeat B12 on 04/01/2012--276 -Patient needs IM B12 supplementation, but due to increasing aggitation and belligerence, will defer to po for now -Check iron studies-iron saturation 6%--> start ferrous sulfate, patient currently has no IV to give ferric gluconate  -Check RBC folate-pending  Family Communication: Pt at beside  Disposition Plan: SNF when able to find suitable place  Antibiotics:  Vancomycin/Zosyn 04/02/2012>>>         Procedures/Studies: Dg Hip Complete Left  03/30/2012  s*RADIOLOGY REPORT*  Clinical Data: Status post fall; left hip pain.  LEFT HIP - COMPLETE 2+ VIEW  Comparison: None.  Findings: There is a mildly displaced subcapital fracture involving the left femoral neck, with mild rotation of the femoral head.  The left femoral head remains seated at the acetabulum.  The right hip joint is unremarkable in appearance.  The sacroiliac joints are unremarkable in appearance.  The visualized bowel gas pattern is grossly unremarkable.  IMPRESSION: Mildly displaced subcapital fracture involving the left femoral neck.   Original Report Authenticated By: Tonia Ghent, M.D.    Ct Head Wo Contrast  03/30/2012   *RADIOLOGY REPORT*  Clinical Data:  Status post fall; laceration above the left eye. Concern for head  or cervical spine injury.  CT HEAD WITHOUT CONTRAST AND CT CERVICAL SPINE WITHOUT CONTRAST  Technique:  Multidetector CT imaging of the head and cervical spine was performed following the standard protocol without intravenous contrast.  Multiplanar CT image reconstructions of the cervical spine were also generated.  Comparison: None  CT HEAD  Findings: There is no evidence of acute infarction, mass lesion, or intra- or extra-axial hemorrhage on CT.  Prominence of the sulci suggests mild cortical volume loss. Relatively diffuse periventricular and subcortical white matter change likely reflects small vessel ischemic microangiopathy. Cerebellar atrophy is noted.  A small chronic lacunar infarct is noted at the anterior right thalamus.  Chronic ischemic change is seen at the external capsule bilaterally.  The brainstem and fourth ventricle are within normal limits.  The cerebral hemispheres demonstrate grossly normal gray-white differentiation.  No mass effect or midline shift is seen.   There is no evidence of fracture; visualized osseous structures are unremarkable in appearance.  The orbits are within normal limits.  The paranasal sinuses and mastoid air cells are well- aerated.  The known soft tissue laceration is not well characterized on CT.  IMPRESSION:  1.  No evidence of traumatic intracranial injury or fracture. 2.  Mild cortical volume loss and diffuse small vessel ischemic microangiopathy. 3.  Small chronic infarct at the anterior right thalamus, and chronic ischemic change at the external capsule bilaterally.  CT CERVICAL SPINE  Findings: There is no evidence of fracture or subluxation. Vertebral bodies demonstrate normal height and alignment. Intervertebral disc spaces are preserved.  Small anterior disc osteophyte complexes are noted along the cervical spine. Prevertebral soft tissues are within normal  limits.  The visualized neural foramina are grossly unremarkable.  The thyroid gland is unremarkable in appearance.  Diffuse interstitial prominence is noted at the lung apices, with small bilateral pleural effusions, concerning for pulmonary edema.  Mild calcification is noted along the left vertebral artery at the level of C2.  IMPRESSION:  1.  No evidence of fracture or subluxation along the cervical spine. 2.  Diffuse interstitial prominence of the lung apices, with small bilateral pleural effusions, concerning for pulmonary edema.   Original Report Authenticated By: Tonia Ghent, M.D.    Ct Cervical Spine Wo Contrast  03/30/2012  *RADIOLOGY REPORT*  Clinical Data:  Status post fall; laceration above the left eye. Concern for head or cervical spine injury.  CT HEAD WITHOUT CONTRAST AND CT CERVICAL SPINE WITHOUT CONTRAST  Technique:  Multidetector CT imaging of the head and cervical spine was performed following the standard protocol without intravenous contrast.  Multiplanar CT image reconstructions of the cervical spine were also generated.  Comparison: None  CT HEAD  Findings: There is no evidence of acute infarction, mass lesion, or intra- or extra-axial hemorrhage on CT.  Prominence of the sulci suggests mild cortical volume loss. Relatively diffuse periventricular and subcortical white matter change likely reflects small vessel ischemic microangiopathy. Cerebellar atrophy is noted.  A small chronic lacunar infarct is noted at the anterior right thalamus.  Chronic ischemic change is seen at the external capsule bilaterally.  The brainstem and fourth ventricle are within normal limits.  The cerebral hemispheres demonstrate grossly normal gray-white differentiation.  No mass effect or midline shift is seen.   There is no evidence of fracture; visualized osseous structures are unremarkable in appearance.  The orbits are within normal limits.  The paranasal sinuses and mastoid air cells are well- aerated.   The known soft tissue laceration is  not well characterized on CT.  IMPRESSION:  1.  No evidence of traumatic intracranial injury or fracture. 2.  Mild cortical volume loss and diffuse small vessel ischemic microangiopathy. 3.  Small chronic infarct at the anterior right thalamus, and chronic ischemic change at the external capsule bilaterally.  CT CERVICAL SPINE  Findings: There is no evidence of fracture or subluxation. Vertebral bodies demonstrate normal height and alignment. Intervertebral disc spaces are preserved.  Small anterior disc osteophyte complexes are noted along the cervical spine. Prevertebral soft tissues are within normal limits.  The visualized neural foramina are grossly unremarkable.  The thyroid gland is unremarkable in appearance.  Diffuse interstitial prominence is noted at the lung apices, with small bilateral pleural effusions, concerning for pulmonary edema.  Mild calcification is noted along the left vertebral artery at the level of C2.  IMPRESSION:  1.  No evidence of fracture or subluxation along the cervical spine. 2.  Diffuse interstitial prominence of the lung apices, with small bilateral pleural effusions, concerning for pulmonary edema.   Original Report Authenticated By: Tonia Ghent, M.D.    Dg Pelvis Portable  03/31/2012  *RADIOLOGY REPORT*  Clinical Data: Postop  PORTABLE PELVIS  Comparison: 03/30/2012.  Findings: Portable film demonstrates satisfactory appearance status post left hemiarthroplasty.  IMPRESSION: Satisfactory position and alignment.   Original Report Authenticated By: Davonna Belling, M.D.    Dg Chest Port 1 View  04/01/2012  *RADIOLOGY REPORT*  Clinical Data: Fever.  PORTABLE CHEST - 1 VIEW  Comparison: Chest radiograph performed 03/30/2012  Findings: The lungs are mildly hypoexpanded.  Vascular congestion is noted, with mild increased interstitial markings, raise concern for mild pulmonary edema.  This is grossly stable from the prior study.  There is no  evidence of pleural effusion or pneumothorax.  The cardiomediastinal silhouette is borderline normal in size.  No acute osseous abnormalities are seen.  IMPRESSION: Lungs mildly hypoexpanded.  Vascular congestion, with mildly increased interstitial markings, raising concern for mild pulmonary edema.  This appears relatively stable from the recent prior study.   Original Report Authenticated By: Tonia Ghent, M.D.    Dg Chest Port 1 View  03/30/2012  *RADIOLOGY REPORT*  Clinical Data: Preoperative chest radiograph.  PORTABLE CHEST - 1 VIEW  Comparison: Chest radiograph performed 02/26/2012  Findings: The lungs are well-aerated.  Vascular congestion is noted.  Bilateral central and bibasilar airspace opacities may reflect mild pulmonary edema.  There is no pleural effusion or pneumothorax.  The cardiomediastinal silhouette is borderline normal in size.  No acute osseous abnormalities are seen.  IMPRESSION: Vascular congestion noted.  Bilateral central and bibasilar airspace opacities may reflect mild pulmonary edema.  This may be transient, due to the patient's recent fall.   Original Report Authenticated By: Tonia Ghent, M.D.          Subjective: Patient has increasing belligerence and agitation today. He is interfering with medical therapy. He refuses placement of a new intravenous access. Portable his IV access today. He denies chest pain, shortness of breath, abdominal pain. There's been no reports of diarrhea. There is one episode of emesis this morning without blood.  Objective: Filed Vitals:   04/04/12 0000 04/04/12 0400 04/04/12 0600 04/04/12 1339  BP:   185/87 155/80  Pulse:   80 65  Temp:   99.1 F (37.3 C) 98.5 F (36.9 C)  TempSrc:   Oral Oral  Resp: 18 18 18 20   Height:      Weight:      SpO2: 96%  97% 93% 94%    Intake/Output Summary (Last 24 hours) at 04/04/12 1925 Last data filed at 04/04/12 1500  Gross per 24 hour  Intake      0 ml  Output   1850 ml  Net  -1850 ml    Weight change:  Exam:   General:  Pt is alert, follows commands appropriately, not in acute distress  HEENT: No icterus, No thrush, [place]/AT  Cardiovascular: RRR, S1/S2, no rubs, no gallops  Respiratory: Basilar crackles left greater than right. No wheezes or rhonchi.  Abdomen: Soft/+BS, non tender, non distended, no guarding  Extremities: trace edema, No lymphangitis, No petechiae, No rashes, no synovitis  Data Reviewed: Basic Metabolic Panel:  Recent Labs Lab 03/31/12 0454 04/01/12 0505 04/02/12 0530 04/03/12 0506 04/04/12 0445  NA 134* 133* 134* 132* 135  K 4.2 4.5 5.4* 4.5 4.2  CL 100 98 102 97 100  CO2 24 24 21 23 25   GLUCOSE 149* 163* 111* 132* 128*  BUN 22 23 26* 24* 21  CREATININE 1.62* 1.64* 1.99* 1.64* 1.38*  CALCIUM 8.3* 8.2* 7.8* 8.4 8.3*  MG  --   --   --  2.2  --   PHOS  --   --   --  3.3  --    Liver Function Tests: No results found for this basename: AST, ALT, ALKPHOS, BILITOT, PROT, ALBUMIN,  in the last 168 hours No results found for this basename: LIPASE, AMYLASE,  in the last 168 hours No results found for this basename: AMMONIA,  in the last 168 hours CBC:  Recent Labs Lab 04/01/12 0505 04/01/12 2315 04/02/12 0530 04/03/12 0506 04/04/12 0445  WBC 10.3 11.0* 11.8* 12.8* 12.7*  NEUTROABS  --  6.8  --   --   --   HGB 10.7* 9.1* 9.3* 10.3* 10.2*  HCT 33.3* 28.3* 28.6* 32.0* 31.4*  MCV 87.6 88.7 87.7 88.6 87.7  PLT 357 347 331 435* 490*   Cardiac Enzymes: No results found for this basename: CKTOTAL, CKMB, CKMBINDEX, TROPONINI,  in the last 168 hours BNP: No components found with this basename: POCBNP,  CBG:  Recent Labs Lab 04/02/12 1655 04/02/12 2112 04/03/12 0744 04/03/12 1110 04/03/12 2125  GLUCAP 117* 126* 107* 124* 117*    Recent Results (from the past 240 hour(s))  URINE CULTURE     Status: None   Collection Time    04/01/12 11:09 PM      Result Value Range Status   Specimen Description URINE, CATHETERIZED   Final    Special Requests NONE   Final   Culture  Setup Time 04/02/2012 03:05   Final   [place]ony Count NO GROWTH   Final   Culture NO GROWTH   Final   Report Status 04/03/2012 FINAL   Final  CULTURE, BLOOD (ROUTINE X 2)     Status: None   Collection Time    04/01/12 11:15 PM      Result Value Range Status   Specimen Description BLOOD RIGHT ANTECUBITAL   Final   Special Requests BOTTLES DRAWN AEROBIC AND ANAEROBIC 4CC   Final   Culture  Setup Time 04/02/2012 03:06   Final   Culture     Final   Value:        BLOOD CULTURE RECEIVED NO GROWTH TO DATE CULTURE WILL BE HELD FOR 5 DAYS BEFORE ISSUING A FINAL NEGATIVE REPORT   Report Status PENDING   Incomplete  CULTURE, BLOOD (ROUTINE X 2)  Status: None   Collection Time    04/01/12 11:24 PM      Result Value Range Status   Specimen Description BLOOD RIGHT HAND   Final   Special Requests BOTTLES DRAWN AEROBIC AND ANAEROBIC 3CC   Final   Culture  Setup Time 04/02/2012 03:06   Final   Culture     Final   Value:        BLOOD CULTURE RECEIVED NO GROWTH TO DATE CULTURE WILL BE HELD FOR 5 DAYS BEFORE ISSUING A FINAL NEGATIVE REPORT   Report Status PENDING   Incomplete     Scheduled Meds: . aspirin EC  325 mg Oral BID  . atenolol  100 mg Oral Daily  . clonazePAM  0.25 mg Oral BID  . docusate sodium  100 mg Oral BID  . feeding supplement  237 mL Oral TID BM  . haloperidol  10 mg Oral BID  . haloperidol  5 mg Oral Once  . insulin aspart  0-15 Units Subcutaneous TID WC  . insulin aspart  0-5 Units Subcutaneous QHS  . levofloxacin  750 mg Oral Daily  . OXcarbazepine  450 mg Oral BID  . pantoprazole  40 mg Oral Daily  . trihexyphenidyl  5 mg Oral BID WC  . vancomycin  750 mg Intravenous Q12H   Continuous Infusions: . sodium chloride 75 mL/hr at 04/03/12 1309     Donyel Castagnola, DO  [facility] Pager [phone]  If 7PM-7AM, please contact night-coverage [email or web address] Password [email or web address] 04/04/2012, 7:25 PM   LOS: 5 days

## 2012-04-04 NOTE — Progress Notes (Signed)
Physical Therapy Treatment Patient Details Name: David Lang MRN: [id number] DOB: [birth year] Today's Date: 04/04/2012 Time: 1308-6578 PT Time Calculation (min): 28 min  PT Assessment / Plan / Recommendation Comments on Treatment Session  Some improvement with mobility this session. Pt still somewhat confused. Continue to recommend SNF.     Follow Up Recommendations  SNF     Does the patient have the potential to tolerate intense rehabilitation     Barriers to Discharge        Equipment Recommendations  Rolling walker with 5" wheels    Recommendations for Other Services    Frequency Min 5X/week   Plan Discharge plan remains appropriate    Precautions / Restrictions Precautions Precautions: Posterior Hip;Fall. Handout posted in room. Precaution Comments: Reviewed hip precautions. Pt will need constant reminders Restrictions Weight Bearing Restrictions: No LLE Weight Bearing: Weight bearing as tolerated   Pertinent Vitals/Pain Pt reports pain with movement L LE-unrated    Mobility  Bed Mobility Bed Mobility: Supine to Sit;Sit to Supine Supine to Sit: 1: +2 Total assist Supine to Sit: Patient Percentage: 40% Sit to Supine: 1: +2 Total assist Sit to Supine: Patient Percentage: 40% Details for Bed Mobility Assistance: Multimodal cues for safety, technique, hand placement, adhrence to precautions. Assist for bil LEs and trunk. Utilized bedpad to assist.  Transfers Transfers: Sit to Stand;Stand to [employer] Transfers Sit to Stand: 1: +2 Total assist;From bed;From elevated surface;From chair/3-in-1 Sit to Stand: Patient Percentage: 60% Stand to Sit: 1: +2 Total assist;To chair/3-in-1;To bed Stand to Sit: Patient Percentage: 60% Details for Transfer Assistance: Multimodal cues for safety, technique, hand placement, L LE positioning. Assist to rise, stabilize control descent, maneuver with RW.  Ambulation/Gait Ambulation/Gait Assistance: 1: +2 Total  assist Ambulation/Gait: Patient Percentage: 60% Ambulation Distance (Feet): 3 Feet Assistive device: Rolling walker Ambulation/Gait Assistance Details: Multimodal cues for safety, technique, sequence, use of UEs to assist with weightbearing. Pt c/o "feeling dizzy". Deferred further ambulation.  Gait Pattern: Step-to pattern;Antalgic;Trunk flexed;Decreased step length - right;Decreased step length - left;Decreased stride length;Decreased stance time - left    Exercises     PT Diagnosis:    PT Problem List:   PT Treatment Interventions:     PT Goals Acute Rehab PT Goals Pt will go Supine/Side to Sit: with supervision PT Goal: Supine/Side to Sit - Progress: Progressing toward goal Pt will go Sit to Supine/Side: with supervision PT Goal: Sit to Supine/Side - Progress: Progressing toward goal Pt will go Sit to Stand: with supervision PT Goal: Sit to Stand - Progress: Progressing toward goal Pt will go Stand to Sit: with supervision PT Goal: Stand to Sit - Progress: Progressing toward goal Pt will Ambulate: 51 - 150 feet;with min assist;with rolling walker PT Goal: Ambulate - Progress: Progressing toward goal  Visit Information  Last PT Received On: 04/04/12 Assistance Needed: +2    Subjective Data  Subjective: Let me approach....no stop walker!!!! Patient Stated Goal: none stated   Cognition  Cognition Overall Cognitive Status: Impaired Area of Impairment: Memory;Safety/judgement;Following commands;Awareness of errors;Problem solving Arousal/Alertness: Awake/alert Orientation Level: Disoriented to;Place;Time;Situation Behavior During Session: Lethargic Memory: Decreased recall of precautions Following Commands: Follows one step commands inconsistently Safety/Judgement: Decreased awareness of safety precautions;Decreased safety judgement for tasks assessed;Decreased awareness of need for assistance    Balance     End of Session PT - End of Session Equipment Utilized During  Treatment: Gait belt Activity Tolerance: Patient limited by fatigue;Patient limited by pain (Limited by dizziness) Patient left: in  bed;with call bell/phone within reach (with KI on L LE)   GP     Rebeca Alert, MPT Pager: [phone]

## 2012-04-04 NOTE — Progress Notes (Signed)
I assumed care of patient at 1630, pt agitated, refusing medications, MD Aware. Pt's sister Lela called, message left per pt's request. Will continue to monitor.

## 2012-04-04 NOTE — Progress Notes (Signed)
Clinical Social Work Progress Note PSYCHIATRY SERVICE LINE 04/04/2012  Patient:  David Lang  Account:  [id number]  Admit Date:  03/30/2012  Clinical Social Worker:  Unk Lightning, LCSW  Date/Time:  04/04/2012 12:00 M  Review of Patient  Overall Medical Condition:   Patient confused and unable to fully participate in assessment.   Participation Level:  Minimal  Participation Quality  Other - See comment   Other Participation Quality:   Patient confused and agitated   Affect  Anxious   Cognitive  Confused   Reaction to Medications/Concerns:   None reported   Modes of Intervention  Support   Summary of Progress/Plan at Discharge   CSW met with patient at bedside. Patient confused and unwilling to talk with CSW. Patient reports that he is at home and needs people to explain why they are in his house. Patient does not understand that he cannot get out of bed and trying to get his walker to leave.    CSW called sister per psych MD request and left a message. CSW will continue to follow. Unit CSW following for SNF placement.

## 2012-04-04 NOTE — Progress Notes (Signed)
ANTIBIOTIC CONSULT NOTE - FOLLOW UP  Pharmacy Consult for Vanc, Zosyn Indication: r/o HAP  No Known Allergies  Patient Measurements: Height: 5' 9.5" (176.5 cm) Weight: 178 lb (80.74 kg) IBW/kg (Calculated) : 71.85  Vital Signs: Temp: 99.1 F (37.3 C) (03/10 0600) Temp src: Oral (03/10 0600) BP: 185/87 mmHg (03/10 0600) Pulse Rate: 80 (03/10 0600) Intake/Output from previous day: 03/09 0701 - 03/10 0700 In: 240 [P.O.:240] Out: 1550 [Urine:1550] Intake/Output from this shift:    Labs:  Recent Labs  04/02/12 0530 04/03/12 0506 04/04/12 0445  WBC 11.8* 12.8* 12.7*  HGB 9.3* 10.3* 10.2*  PLT 331 435* 490*  CREATININE 1.99* 1.64* 1.38*   Estimated Creatinine Clearance: 56.4 ml/min (by C-G formula based on Cr of 1.38). No results found for this basename: VANCOTROUGH, VANCOPEAK, VANCORANDOM, GENTTROUGH, GENTPEAK, GENTRANDOM, TOBRATROUGH, TOBRAPEAK, TOBRARND, AMIKACINPEAK, AMIKACINTROU, AMIKACIN,  in the last 72 hours    Assessment:  61 yom presented 3/5 s/p fall, admitted with L hip fx.  Patient underwent repair 3/6.  CXR negative for PNA on admit but on 3/7 pt found with Tmax of 102, WBC 11.8, MD mentioned opacity on repeat CXR and ordered for broad spectrum abx for HAP coverage.    Today is Day#3 vanc/zosyn.  Pt continues to be afebrile, WBC stable at 12.7K.  Urine cx negative, blood cultures pending.  Renal function improved (Scr. 1.38) for CG and normalized CrCl of 56 ml/min.    Goal of Therapy:  Vancomycin trough level 15-20 mcg/ml Renal adjustment of Zosyn Eradication of infection  Plan:   Change Vancomycin to 750 mg IV q12h  Continue Zosyn 3.375 gm IV q8h extended dosing interval over 4 hours  Pharmacy will f/u   Geoffry Paradise, PharmD, BCPS Pager: [phone] 12:52 PM Pharmacy #: [id number]

## 2012-04-04 NOTE — Progress Notes (Addendum)
Pt agitated for most of the day.  He was very loud and was continuously disturbing other patients on the unit.  Staff were constantly going to his room to try to calm him down.  He refused all cbgs and insulin the entire day.  Director had to come in room in order for NT to get him to check BP.  He did agree to take most of morning medications and a couple of PRN doses of haldol. After midday he refused almost all care.  He asked to take foley out.  Order received and foley removed.  He pulled out IV beginning of shift and refused to have IV nurse put another IV in. Notified MD of situation.  He has placed new orders and placed progress note in chart.
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
## 2012-04-05 ENCOUNTER — Inpatient Hospital Stay (HOSPITAL_COMMUNITY): Payer: Medicaid Other

## 2012-04-05 LAB — GLUCOSE, CAPILLARY
Glucose-Capillary: 88 mg/dL (ref 70–99)
Glucose-Capillary: 140 mg/dL — ABNORMAL HIGH (ref 70–99)

## 2012-04-05 MED ORDER — ARIPIPRAZOLE 5 MG PO TABS
5.0000 mg | ORAL_TABLET | Freq: Every day | ORAL | Status: DC
  Administered 2012-04-05 – 2012-04-18 (×14): 5 mg via ORAL
  Filled 2012-04-05 (×14): qty 1

## 2012-04-05 NOTE — Progress Notes (Signed)
Patient is very agitated, restless, and impulsive. He has been in and out of bed several times in the last two hours. He is talking loudly and demanding that thing be done just as he request. He is disoriented to place. He constantly yells that he has lived here for the past 30 years and what are we "staff" doing here. He refused to have is blood sugar checked and refuse to take any medications insisting that he did not need them and call his sister.

## 2012-04-05 NOTE — Progress Notes (Signed)
PATIENT ID: David Lang   5 Days Post-Op Procedure(s) (LRB): ARTHROPLASTY BIPOLAR HIP (Left)  Subjective: Patient states he has left hip pain. Understands that he refused oral medications and IV. Understands he needs to take medication to help pain. Otherwise no complaints or concerns.   Objective:  Filed Vitals:   04/04/12 2100  BP: 170/75  Pulse: 76  Temp: 98 F (36.7 C)  Resp: 18     Somewhat agitated but conversational L LE with no dressing, staples intact Incision appears benign, no drainage, small area of what seems to be skin irritation superior to wound, no evidence of cellulitis L knee immobilizer readjusted Wiggles toes, nvid  Labs:   Recent Labs  04/03/12 0506 04/04/12 0445  HGB 10.3* 10.2*   Recent Labs  04/03/12 0506 04/04/12 0445  WBC 12.8* 12.7*  RBC 3.61* 3.58*  HCT 32.0* 31.4*  PLT 435* 490*   Recent Labs  04/03/12 0506 04/04/12 0445  NA 132* 135  K 4.5 4.2  CL 97 100  CO2 23 25  BUN 24* 21  CREATININE 1.64* 1.38*  GLUCOSE 132* 128*  CALCIUM 8.4 8.3*    Assessment and Plan: 5 days s/p left hip hemiarthroplasty No evidence of infection but will monitor skin   WBAT Hopefully he will start taking po medication or accepting IV placement and become less agitated, hopefully can get up with PT Plan to d/c to SNF when medically stable per medicine team  VTE proph: SCDs, ASA 325mg  BID

## 2012-04-05 NOTE — Progress Notes (Signed)
Clinical Social Work Department BRIEF PSYCHOSOCIAL ASSESSMENT 04/05/2012  Patient:  Langille,FRED     Account Number:  [id number]     Admit date:  03/30/2012  Clinical Social Worker:  Hattie Perch  Date/Time:  04/05/2012 12:00 M  Referred by:  Physician  Date Referred:  04/05/2012 Referred for  SNF Placement   Other Referral:   Interview type:  Patient Other interview type:   Sister    PSYCHOSOCIAL DATA Living Status:  FACILITY Admitted from facility:  [facility] Level of care:   Primary support name:  Shammas,[name] Primary support relationship to patient:  SIBLING Degree of support available:   poor    CURRENT CONCERNS Current Concerns  Post-Acute Placement   Other Concerns:    SOCIAL WORK ASSESSMENT / PLAN CSW met with patient. Patient does not have capacity to make decisions. Patient is a paranoid schizophrenic and has had delusions and is agitated. CSW spoke with patient's sister. Discussed letter of guarantee, medicaid/medicare application, and limited options of placement. sister is agreeable to any facility where we can find placement.   Assessment/plan status:   Other assessment/ plan:   Information/referral to community resources:    PATIENT'S/FAMILY'S RESPONSE TO PLAN OF CARE: agreeable to any facility that patient can get in to. CSW has faxed patient out and has applied for level 2 passar.

## 2012-04-05 NOTE — Progress Notes (Signed)
Return phone call made to Rupert Stacks from the ethics committee in reference to the patient. General information provides with a plan for David Lang along with Rupert Stacks to meet with the attending physician to discuss patient. The patient's sister's contact information has been given to Rupert Stacks to follow up with regarding the patient. Bertram Savin telephone # is [phone].
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
## 2012-04-05 NOTE — Progress Notes (Signed)
Clinical Social Work  CSW spoke with psych MD who requests that CSW contact sisters. CSW called both sisters and left messages with CSW contact information. CSW spoke with ethics committee who wants to be informed on outcome. CSW will speak with sisters regarding guardianship and if they have started the process. CSW spoke with [facility] regarding second opinion if patient refuses medication. CSW provided charge RN with 2nd opinion form. CSW will continue to follow.  [place], [place] [phone]
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
## 2012-04-05 NOTE — Ethics Note (Signed)
Consult received from Wilma Flavin, MD on 04/04/12 for clarification of goals of care and progression. Spoke with nursing staff, social work, MD and chart reviewed. Health care team indicates that the patient's sisters are aware of the situation and are amenable to assist. Social work indicated a plan to work with family regarding legal guardianship and competency. Recommend to continue monitoring patient safety, dignity and rights protected and plan to involve family for decision making.  Appreciate consult, will continue to follow for assistance as needed.

## 2012-04-05 NOTE — Progress Notes (Signed)
[facility] PROGRESS NOTE  Tacoma Merida JXB:[id number] DOB: Dec 02, 1950 DOA: 03/30/2012 PCP: Default, Provider, MD  Brief History 62 year old male with a history of paranoid schizophrenia who was initially admitted to [facility]) on 03/05/2012 until 03/30/2012. The patient suffered a mechanical fall resulting in a displaced left subcapital femoral neck fracture. The patient was transferred to [facility], and he underwent a left hip hemiarthroplasty on 03/31/2012 performed by Dr. Jones Broom. Postoperatively, the patient did not have any immediate complications. In fact, the patient was quite somnolent for 2 days after his surgery. The patient was noted to have renal insufficiency. The patient was started on intravenous fluids. His initial serum creatinine was 1.64 on the day of admission. However with time, it is likely that the patient has a new baseline serum creatinine between 1.3 and 1.6. The patient was maintained on his Haldol, Klonopin, and Artane as recommended by psychiatry. On the evening of 04/01/2012, the patient spiked a temperature of 102.436F. Workup revealed right lower lobe infiltrate consistent with HAP. The patient was started on empiric vancomycin and Zosyn. Blood cultures were obtained and were negative. After over 48 hours of intravenous antibiotics, the patient became increasingly agitated and pulled out his IV. Fortunately, the patient had defervescence, and he was transitioned to oral Levaquin.  Beginning on 04/04/2012, the patient became increasingly agitated and belligerent. He refused to take his oral medications and other medical interventions. He put out his IV. He refused reinsertion of IV access. The patient continued to be aggressive.  Psychiatry was reconsulted to see the patient. They referred the hospitalist to the forced medication protocol. However, it was unclear the exact protocol and if this could be instituted outside of [facility].  Ethics committee and  risk management were consulted for input.  At this point, the patient has been determined not to have capacity to make any medical decisions. Unfortunately, he cannot be forced to take his medications or undergo any diagnostic studies including EKG, x-rays, CBGs, renal ultrasound, all of which he has refused.  IM Haldol can be given for extreme aggitation. Fortunately, the patient continues to work with physical therapy.  Discussions with the patient's sisters to obtain legal power of attorney /guardianship continue to take place as forced medications cannot be without consent from a legal guardian. The patient has now refused further blood draws. He remains afebrile hemodynamically stable. He will continue Levaquin through 04/08/2012.  Assessment/Plan: Fever/leukocytosis  -Temperature 102.436F, 04/01/2012 evening  -Temperature trending down since the antibiotics  -now afebrile for past 72 hours (3/9-3/10)  -WBC increased 11.8 (04/02/2012) but stable  -Blood cultures already obtained--neg to date  -Urinalysis does not suggest UTI  -Chest x-ray shows increasing right lower lobe opacity suggesting pneumonia  -Started Zosyn and vancomycin (04/02/12); stopped when pt pulled out IV 04/04/12 -Patient was increasingly agitated and belligerent today (3/10)and pulled out his IV--> start patient on levofloxacin and discontinued IV antibiotics and observe  Somnolence-->Aggitation -Resolved-->Patient was increasingly agitated and belligerent today (04/04/12) and pulled out his IV and refuses other medications and medical therapy  -Likely due to the new infectious process-->HCAP  -Hold Haldol and Klonopin  -More alert since starting antibiotics for infectious process  -Haldol and Klonopin have been restarted, but the patient has been refusing oral medications  -Discussed again today with Dr. Perfecto Kingdom abilify 5mg  daily x 2 weeks, then long acting monthly injection if tolerates after 2 weeks -EKGs to monitor  QT interval; however, pt refuses HCAP/Hypoxemia  -Blood cultures are obtained--negative  to date  -Empiric vancomycin and Zosyn--> switch to Levaquin as discussed above (04/04/2012)  -last day for levofloxacin to be on 04/08/12 Left subcapital femoral neck fracture  -Appreciate orthopedics  -Left hip hemiarthroplasty, 03/31/2012  Hyperkalemia  -IV fluids, normal saline-->resolved -Follow BMP  Renal insufficiency  -Renal ultrasound--result pending  -Patient had serum creatinine 1.71 on 02/26/2012  -It appears as the baseline maybe between 1.3-1.6  -Foley catheter placed again for urinary retention, bladder scan only showed 233cc-->d/c foley 04/04/12 as the patient was increasingly irritated by the Foley catheter and pulling on his catheter causing hematuria  -Foley catheter was discontinued earlier in the day 04/01/2012  Hypertension  -Hold on amlodipine for now blood pressure is a little soft this morning--> restore amlodipine his blood pressure is increasing (04/05/2038)  -Continue atenolol  Paranoid schizophrenia  -I consulted psychiatry to follow patient  -Case discussed with Dr. Ferol Luz regarding patient's increasing agitation, belligerence, and refusal to take medications. I have asked her to reevaluate the patient  -Haldol IM for aggitation for now  -Patient does not have capacity to make any decisions  -Continue Haldol, Klonopin, Trileptal, Artane as per psychiatry recommendations  Diabetes mellitus type 2  -CBGs controlled--today pt refusing CBGs  - Hemoglobin A1c 8.8 on 03/22/2012  -Continue NovoLog sliding scale  -Patient was not on any medications during his February admission  -Remotely taken metformin  Anemia  -Repeat serum B12 as this was marginally low on 03/22/2012--> repeat B12 on 04/01/2012--276  -Patient needs IM B12 supplementation, but due to increasing aggitation and belligerence, will defer to po for now  -Check iron studies-iron saturation 6%--> start ferrous  sulfate, patient currently has no IV to give ferric gluconate  -Check RBC folate-pending  Family Communication: Pt at beside  Disposition Plan: SNF when able to find suitable place  Antibiotics:  Vancomycin/Zosyn 04/02/2012>>>04/04/12 Levofloxacin 04/05/12>>>        Procedures/Studies: Dg Hip Complete Left  03/30/2012  s*RADIOLOGY REPORT*  Clinical Data: Status post fall; left hip pain.  LEFT HIP - COMPLETE 2+ VIEW  Comparison: None.  Findings: There is a mildly displaced subcapital fracture involving the left femoral neck, with mild rotation of the femoral head.  The left femoral head remains seated at the acetabulum.  The right hip joint is unremarkable in appearance.  The sacroiliac joints are unremarkable in appearance.  The visualized bowel gas pattern is grossly unremarkable.  IMPRESSION: Mildly displaced subcapital fracture involving the left femoral neck.   Original Report Authenticated By: Tonia Ghent, M.D.    Ct Head Wo Contrast  03/30/2012  *RADIOLOGY REPORT*  Clinical Data:  Status post fall; laceration above the left eye. Concern for head or cervical spine injury.  CT HEAD WITHOUT CONTRAST AND CT CERVICAL SPINE WITHOUT CONTRAST  Technique:  Multidetector CT imaging of the head and cervical spine was performed following the standard protocol without intravenous contrast.  Multiplanar CT image reconstructions of the cervical spine were also generated.  Comparison: None  CT HEAD  Findings: There is no evidence of acute infarction, mass lesion, or intra- or extra-axial hemorrhage on CT.  Prominence of the sulci suggests mild cortical volume loss. Relatively diffuse periventricular and subcortical white matter change likely reflects small vessel ischemic microangiopathy. Cerebellar atrophy is noted.  A small chronic lacunar infarct is noted at the anterior right thalamus.  Chronic ischemic change is seen at the external capsule bilaterally.  The brainstem and fourth ventricle are within  normal limits.  The cerebral hemispheres demonstrate grossly  normal gray-white differentiation.  No mass effect or midline shift is seen.   There is no evidence of fracture; visualized osseous structures are unremarkable in appearance.  The orbits are within normal limits.  The paranasal sinuses and mastoid air cells are well- aerated.  The known soft tissue laceration is not well characterized on CT.  IMPRESSION:  1.  No evidence of traumatic intracranial injury or fracture. 2.  Mild cortical volume loss and diffuse small vessel ischemic microangiopathy. 3.  Small chronic infarct at the anterior right thalamus, and chronic ischemic change at the external capsule bilaterally.  CT CERVICAL SPINE  Findings: There is no evidence of fracture or subluxation. Vertebral bodies demonstrate normal height and alignment. Intervertebral disc spaces are preserved.  Small anterior disc osteophyte complexes are noted along the cervical spine. Prevertebral soft tissues are within normal limits.  The visualized neural foramina are grossly unremarkable.  The thyroid gland is unremarkable in appearance.  Diffuse interstitial prominence is noted at the lung apices, with small bilateral pleural effusions, concerning for pulmonary edema.  Mild calcification is noted along the left vertebral artery at the level of C2.  IMPRESSION:  1.  No evidence of fracture or subluxation along the cervical spine. 2.  Diffuse interstitial prominence of the lung apices, with small bilateral pleural effusions, concerning for pulmonary edema.   Original Report Authenticated By: Tonia Ghent, M.D.    Ct Cervical Spine Wo Contrast  03/30/2012  *RADIOLOGY REPORT*  Clinical Data:  Status post fall; laceration above the left eye. Concern for head or cervical spine injury.  CT HEAD WITHOUT CONTRAST AND CT CERVICAL SPINE WITHOUT CONTRAST  Technique:  Multidetector CT imaging of the head and cervical spine was performed following the standard protocol without  intravenous contrast.  Multiplanar CT image reconstructions of the cervical spine were also generated.  Comparison: None  CT HEAD  Findings: There is no evidence of acute infarction, mass lesion, or intra- or extra-axial hemorrhage on CT.  Prominence of the sulci suggests mild cortical volume loss. Relatively diffuse periventricular and subcortical white matter change likely reflects small vessel ischemic microangiopathy. Cerebellar atrophy is noted.  A small chronic lacunar infarct is noted at the anterior right thalamus.  Chronic ischemic change is seen at the external capsule bilaterally.  The brainstem and fourth ventricle are within normal limits.  The cerebral hemispheres demonstrate grossly normal gray-white differentiation.  No mass effect or midline shift is seen.   There is no evidence of fracture; visualized osseous structures are unremarkable in appearance.  The orbits are within normal limits.  The paranasal sinuses and mastoid air cells are well- aerated.  The known soft tissue laceration is not well characterized on CT.  IMPRESSION:  1.  No evidence of traumatic intracranial injury or fracture. 2.  Mild cortical volume loss and diffuse small vessel ischemic microangiopathy. 3.  Small chronic infarct at the anterior right thalamus, and chronic ischemic change at the external capsule bilaterally.  CT CERVICAL SPINE  Findings: There is no evidence of fracture or subluxation. Vertebral bodies demonstrate normal height and alignment. Intervertebral disc spaces are preserved.  Small anterior disc osteophyte complexes are noted along the cervical spine. Prevertebral soft tissues are within normal limits.  The visualized neural foramina are grossly unremarkable.  The thyroid gland is unremarkable in appearance.  Diffuse interstitial prominence is noted at the lung apices, with small bilateral pleural effusions, concerning for pulmonary edema.  Mild calcification is noted along the left vertebral artery at the  level of C2.  IMPRESSION:  1.  No evidence of fracture or subluxation along the cervical spine. 2.  Diffuse interstitial prominence of the lung apices, with small bilateral pleural effusions, concerning for pulmonary edema.   Original Report Authenticated By: Tonia Ghent, M.D.    Dg Pelvis Portable  03/31/2012  *RADIOLOGY REPORT*  Clinical Data: Postop  PORTABLE PELVIS  Comparison: 03/30/2012.  Findings: Portable film demonstrates satisfactory appearance status post left hemiarthroplasty.  IMPRESSION: Satisfactory position and alignment.   Original Report Authenticated By: Davonna Belling, M.D.    Dg Chest Port 1 View  04/01/2012  *RADIOLOGY REPORT*  Clinical Data: Fever.  PORTABLE CHEST - 1 VIEW  Comparison: Chest radiograph performed 03/30/2012  Findings: The lungs are mildly hypoexpanded.  Vascular congestion is noted, with mild increased interstitial markings, raise concern for mild pulmonary edema.  This is grossly stable from the prior study.  There is no evidence of pleural effusion or pneumothorax.  The cardiomediastinal silhouette is borderline normal in size.  No acute osseous abnormalities are seen.  IMPRESSION: Lungs mildly hypoexpanded.  Vascular congestion, with mildly increased interstitial markings, raising concern for mild pulmonary edema.  This appears relatively stable from the recent prior study.   Original Report Authenticated By: Tonia Ghent, M.D.    Dg Chest Port 1 View  03/30/2012  *RADIOLOGY REPORT*  Clinical Data: Preoperative chest radiograph.  PORTABLE CHEST - 1 VIEW  Comparison: Chest radiograph performed 02/26/2012  Findings: The lungs are well-aerated.  Vascular congestion is noted.  Bilateral central and bibasilar airspace opacities may reflect mild pulmonary edema.  There is no pleural effusion or pneumothorax.  The cardiomediastinal silhouette is borderline normal in size.  No acute osseous abnormalities are seen.  IMPRESSION: Vascular congestion noted.  Bilateral central and  bibasilar airspace opacities may reflect mild pulmonary edema.  This may be transient, due to the patient's recent fall.   Original Report Authenticated By: Tonia Ghent, M.D.          Subjective: Patient continues to refuse diagnostic studies as well as blood draws and CBGs. He remains intermittently agitated. He denies any fevers, chills, chest pain, shortness breath. He complains of pain in his hips. No reports of vomiting or diarrhea today. No abdominal pain.  Objective: Filed Vitals:   04/04/12 0600 04/04/12 1339 04/04/12 2100 04/05/12 1414  BP: 185/87 155/80 170/75 168/83  Pulse: 80 65 76 62  Temp: 99.1 F (37.3 C) 98.5 F (36.9 C) 98 F (36.7 C) 98.1 F (36.7 C)  TempSrc: Oral Oral Oral Oral  Resp: 18 20 18 18   Height:      Weight:      SpO2: 93% 94% 94% 98%    Intake/Output Summary (Last 24 hours) at 04/05/12 1841 Last data filed at 04/05/12 0854  Gross per 24 hour  Intake      0 ml  Output    500 ml  Net   -500 ml   Weight change:  Exam:   General:  Pt is alert, follows commands appropriately, not in acute distress  HEENT: No icterus, No thrush, [place]/AT  Cardiovascular: RRR, S1/S2, no rubs, no gallops  Respiratory: Bibasilar crackles. Left clear to auscultation.  Abdomen: Soft/+BS, non tender, non distended, no guarding  Extremities: 1+ edema, No lymphangitis, No petechiae, No rashes, no synovitis  Data Reviewed: Basic Metabolic Panel:  Recent Labs Lab 03/31/12 0454 04/01/12 0505 04/02/12 0530 04/03/12 0506 04/04/12 0445  NA 134* 133* 134* 132* 135  K 4.2 4.5 5.4*  4.5 4.2  CL 100 98 102 97 100  CO2 24 24 21 23 25   GLUCOSE 149* 163* 111* 132* 128*  BUN 22 23 26* 24* 21  CREATININE 1.62* 1.64* 1.99* 1.64* 1.38*  CALCIUM 8.3* 8.2* 7.8* 8.4 8.3*  MG  --   --   --  2.2  --   PHOS  --   --   --  3.3  --    Liver Function Tests: No results found for this basename: AST, ALT, ALKPHOS, BILITOT, PROT, ALBUMIN,  in the last 168 hours No results  found for this basename: LIPASE, AMYLASE,  in the last 168 hours No results found for this basename: AMMONIA,  in the last 168 hours CBC:  Recent Labs Lab 04/01/12 0505 04/01/12 2315 04/02/12 0530 04/03/12 0506 04/04/12 0445  WBC 10.3 11.0* 11.8* 12.8* 12.7*  NEUTROABS  --  6.8  --   --   --   HGB 10.7* 9.1* 9.3* 10.3* 10.2*  HCT 33.3* 28.3* 28.6* 32.0* 31.4*  MCV 87.6 88.7 87.7 88.6 87.7  PLT 357 347 331 435* 490*   Cardiac Enzymes: No results found for this basename: CKTOTAL, CKMB, CKMBINDEX, TROPONINI,  in the last 168 hours BNP: No components found with this basename: POCBNP,  CBG:  Recent Labs Lab 04/02/12 2112 04/03/12 0744 04/03/12 1110 04/03/12 2125 04/05/12 1711  GLUCAP 126* 107* 124* 117* 88    Recent Results (from the past 240 hour(s))  URINE CULTURE     Status: None   Collection Time    04/01/12 11:09 PM      Result Value Range Status   Specimen Description URINE, CATHETERIZED   Final   Special Requests NONE   Final   Culture  Setup Time 04/02/2012 03:05   Final   Colony Count NO GROWTH   Final   Culture NO GROWTH   Final   Report Status 04/03/2012 FINAL   Final  CULTURE, BLOOD (ROUTINE X 2)     Status: None   Collection Time    04/01/12 11:15 PM      Result Value Range Status   Specimen Description BLOOD RIGHT ANTECUBITAL   Final   Special Requests BOTTLES DRAWN AEROBIC AND ANAEROBIC 4CC   Final   Culture  Setup Time 04/02/2012 03:06   Final   Culture     Final   Value:        BLOOD CULTURE RECEIVED NO GROWTH TO DATE CULTURE WILL BE HELD FOR 5 DAYS BEFORE ISSUING A FINAL NEGATIVE REPORT   Report Status PENDING   Incomplete  CULTURE, BLOOD (ROUTINE X 2)     Status: None   Collection Time    04/01/12 11:24 PM      Result Value Range Status   Specimen Description BLOOD RIGHT HAND   Final   Special Requests BOTTLES DRAWN AEROBIC AND ANAEROBIC 3CC   Final   Culture  Setup Time 04/02/2012 03:06   Final   Culture     Final   Value:        BLOOD  CULTURE RECEIVED NO GROWTH TO DATE CULTURE WILL BE HELD FOR 5 DAYS BEFORE ISSUING A FINAL NEGATIVE REPORT   Report Status PENDING   Incomplete     Scheduled Meds: . amLODipine  5 mg Oral QHS  . ARIPiprazole  5 mg Oral Daily  . aspirin EC  325 mg Oral BID  . atenolol  100 mg Oral Daily  . clonazePAM  0.25 mg  Oral BID  . docusate sodium  100 mg Oral BID  . feeding supplement  237 mL Oral TID BM  . ferrous sulfate  325 mg Oral BID WC  . haloperidol  10 mg Oral BID  . haloperidol  5 mg Oral Once  . insulin aspart  0-15 Units Subcutaneous TID WC  . insulin aspart  0-5 Units Subcutaneous QHS  . levofloxacin  750 mg Oral Daily  . OXcarbazepine  450 mg Oral BID  . pantoprazole  40 mg Oral Daily  . trihexyphenidyl  5 mg Oral BID WC  . vancomycin  750 mg Intravenous Q12H  . vitamin B-12  1,000 mcg Oral Daily   Continuous Infusions: . sodium chloride 75 mL/hr at 04/03/12 1309     TAT, DAVID, DO  [facility] Pager [phone]  If 7PM-7AM, please contact night-coverage [email or web address] Password [facility] 04/05/2012, 6:41 PM   LOS: 6 days

## 2012-04-06 LAB — GLUCOSE, CAPILLARY
Glucose-Capillary: 113 mg/dL — ABNORMAL HIGH (ref 70–99)
Glucose-Capillary: 136 mg/dL — ABNORMAL HIGH (ref 70–99)

## 2012-04-06 LAB — FOLATE RBC: RBC Folate: 655 ng/mL — ABNORMAL HIGH

## 2012-04-06 MED ORDER — LORAZEPAM 2 MG/ML IJ SOLN
1.0000 mg | Freq: Once | INTRAMUSCULAR | Status: AC
  Filled 2012-04-06: qty 1

## 2012-04-06 NOTE — Progress Notes (Signed)
Clinical Social Work  CSW spoke with the Ethics Committee who requested that CSW speak with psych MD to determine if she could determine legal competency and to see if patient is appropriate for IVC. Psych MD reports she can only determine capacity and agreeable to IVC. CSW completed IVC paperwork and faxed to the Magistrate. CSW called the Magistrate who received fax. CSW will await for [employer] to serve patient.  [place], [place] [phone]

## 2012-04-06 NOTE — Progress Notes (Signed)
Clinical Social Work  CSW was asked by psych MD to speak with siblings regarding guardianship. CSW spoke with sister (Mrs. Raiford Noble) who reports that she and sister hired a Clinical research associate and applied for guardianship about 2-3 weeks ago. Sister reports that lawyer is waiting on court date to determine if sister's can gain guardianship. Sister reports that she wants everything done for patient and agreeable to consent to any tests needed.   CSW spoke with ethics committee and updated them on sister's plan. Committee provided CSW with policy regarding forced medication. Committee reports that policy is written for [facility] and that Risk management would have to decide if policy is enforced at [facility]. CSW shared this information during progression with charge RN and MD.   CSW will continue to follow to assist as needed. Unit CSW working on SNF placement and sister is agreeable to SNF at dc.  [place], [place] [phone]
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
## 2012-04-06 NOTE — Discharge Summary (Signed)
Seen and agreed. Mojeed Akintayo, MD 

## 2012-04-06 NOTE — Progress Notes (Signed)
Seen and agreed. Mojeed Akintayo, MD 

## 2012-04-06 NOTE — Progress Notes (Signed)
[facility] PROGRESS NOTE  David Lang JXB:[id number] DOB: 02-Jul-1950 DOA: 03/30/2012 PCP: Malayasia Mirkin, MD   Assessment/Plan: Fever/leukocytosis  -Temperature 102.53F, 04/01/2012 evening, may be post-operative fever versus HAP related. -now afebrile for 4 days  -WBC increased 11.8 (04/02/2012) but stable and patient refusing labs -Blood cultures --neg to date  -Urinalysis does not suggest UTI  -Chest x-ray shows increasing right lower lobe opacity suggesting pneumonia  -Started Zosyn and vancomycin (04/02/12); stopped when pt pulled out IV 04/04/12 -Continue levofloxacin through 3/14  Somnolence with subsequent agitation -Resolved-->Patient was increasingly agitated and belligerent (04/04/12) and pulled out his IV.  He can be convinced to take some medications, however, [facility] does not endorse forced oral medication administration.   -Haldol and Klonopin have been restarted  -Appreciate Dr. Ferol Luz assistance -  Continue abilify 5mg  daily x 2 weeks, then long acting monthly injection if tolerates after 2 weeks -EKGs to monitor QT interval; however, pt refuses  HCAP/Hypoxemia  -Blood cultures are obtained--negative to date  -Empiric vancomycin and Zosyn--> switch to Levaquin as discussed above (04/04/2012)  -last day for levofloxacin to be on 04/08/12  Left subcapital femoral neck fracture  -Appreciate orthopedics  -Left hip hemiarthroplasty, 03/31/2012   CKD stage 3 -  Minimize nephrotoxins -  Renally dose medications if needed. - Renal ultrasound--report pending  - Foley catheter placed again for urinary retention, bladder scan only showed 233cc-->d/c foley 04/04/12 as the patient was increasingly irritated by the Foley catheter and pulling on his catheter causing hematuria  - Foley catheter was discontinued earlier in the day 04/01/2012   Hypertension  - Initially held antihypertensives, however restarted on 3/10 -  BP elevated however patient refusing  some medications.    -Continue atenolol   Paranoid schizophrenia  - Appreciate Dr. Ferol Luz assistance -  Patient now taking some of his oral medications when encouraged -  Cont abilify 5mg  daily as above -  Haldol 10mg  po BID -  Triletpal 450mg  BID -  Artane 5mg  BID -  Clonazepam BID -  Haldol IM prn agitation  Diabetes mellitus type 2  - Hemoglobin A1c 8.8 on 03/22/2012  -Continue NovoLog sliding scale  -Patient was not on any medications during his February admission  -Remotely taken metformin   Anemia due to b12 and iron deficiency -Repeat serum B12 as this was marginally low on 03/22/2012--> repeat B12 on 04/01/2012--276  -Continue oral B12  -continue ferrous sulfate - Needs outpatient GI follow up -Check RBC folate-655  Family Communication: Pt at beside  Disposition Plan: SNF when able to find suitable place    Antibiotics:  Vancomycin/Zosyn 04/02/2012>>>04/04/12 Levofloxacin 04/05/12>>>   Procedures/Studies: Dg Hip Complete Left  03/30/2012  s*RADIOLOGY REPORT*  Clinical Data: Status post fall; left hip pain.  LEFT HIP - COMPLETE 2+ VIEW  Comparison: None.  Findings: There is a mildly displaced subcapital fracture involving the left femoral neck, with mild rotation of the femoral head.  The left femoral head remains seated at the acetabulum.  The right hip joint is unremarkable in appearance.  The sacroiliac joints are unremarkable in appearance.  The visualized bowel gas pattern is grossly unremarkable.  IMPRESSION: Mildly displaced subcapital fracture involving the left femoral neck.   Original Report Authenticated By: Tonia Ghent, M.D.    Ct Head Wo Contrast  03/30/2012  *RADIOLOGY REPORT*  Clinical Data:  Status post fall; laceration above the left eye. Concern for head or cervical spine injury.  CT HEAD WITHOUT CONTRAST AND CT CERVICAL SPINE  WITHOUT CONTRAST  Technique:  Multidetector CT imaging of the head and cervical spine was performed following the standard  protocol without intravenous contrast.  Multiplanar CT image reconstructions of the cervical spine were also generated.  Comparison: None  CT HEAD  Findings: There is no evidence of acute infarction, mass lesion, or intra- or extra-axial hemorrhage on CT.  Prominence of the sulci suggests mild cortical volume loss. Relatively diffuse periventricular and subcortical white matter change likely reflects small vessel ischemic microangiopathy. Cerebellar atrophy is noted.  A small chronic lacunar infarct is noted at the anterior right thalamus.  Chronic ischemic change is seen at the external capsule bilaterally.  The brainstem and fourth ventricle are within normal limits.  The cerebral hemispheres demonstrate grossly normal gray-white differentiation.  No mass effect or midline shift is seen.   There is no evidence of fracture; visualized osseous structures are unremarkable in appearance.  The orbits are within normal limits.  The paranasal sinuses and mastoid air cells are well- aerated.  The known soft tissue laceration is not well characterized on CT.  IMPRESSION:  1.  No evidence of traumatic intracranial injury or fracture. 2.  Mild cortical volume loss and diffuse small vessel ischemic microangiopathy. 3.  Small chronic infarct at the anterior right thalamus, and chronic ischemic change at the external capsule bilaterally.  CT CERVICAL SPINE  Findings: There is no evidence of fracture or subluxation. Vertebral bodies demonstrate normal height and alignment. Intervertebral disc spaces are preserved.  Small anterior disc osteophyte complexes are noted along the cervical spine. Prevertebral soft tissues are within normal limits.  The visualized neural foramina are grossly unremarkable.  The thyroid gland is unremarkable in appearance.  Diffuse interstitial prominence is noted at the lung apices, with small bilateral pleural effusions, concerning for pulmonary edema.  Mild calcification is noted along the left  vertebral artery at the level of C2.  IMPRESSION:  1.  No evidence of fracture or subluxation along the cervical spine. 2.  Diffuse interstitial prominence of the lung apices, with small bilateral pleural effusions, concerning for pulmonary edema.   Original Report Authenticated By: Tonia Ghent, M.D.    Ct Cervical Spine Wo Contrast  03/30/2012  *RADIOLOGY REPORT*  Clinical Data:  Status post fall; laceration above the left eye. Concern for head or cervical spine injury.  CT HEAD WITHOUT CONTRAST AND CT CERVICAL SPINE WITHOUT CONTRAST  Technique:  Multidetector CT imaging of the head and cervical spine was performed following the standard protocol without intravenous contrast.  Multiplanar CT image reconstructions of the cervical spine were also generated.  Comparison: None  CT HEAD  Findings: There is no evidence of acute infarction, mass lesion, or intra- or extra-axial hemorrhage on CT.  Prominence of the sulci suggests mild cortical volume loss. Relatively diffuse periventricular and subcortical white matter change likely reflects small vessel ischemic microangiopathy. Cerebellar atrophy is noted.  A small chronic lacunar infarct is noted at the anterior right thalamus.  Chronic ischemic change is seen at the external capsule bilaterally.  The brainstem and fourth ventricle are within normal limits.  The cerebral hemispheres demonstrate grossly normal gray-white differentiation.  No mass effect or midline shift is seen.   There is no evidence of fracture; visualized osseous structures are unremarkable in appearance.  The orbits are within normal limits.  The paranasal sinuses and mastoid air cells are well- aerated.  The known soft tissue laceration is not well characterized on CT.  IMPRESSION:  1.  No evidence of  traumatic intracranial injury or fracture. 2.  Mild cortical volume loss and diffuse small vessel ischemic microangiopathy. 3.  Small chronic infarct at the anterior right thalamus, and chronic  ischemic change at the external capsule bilaterally.  CT CERVICAL SPINE  Findings: There is no evidence of fracture or subluxation. Vertebral bodies demonstrate normal height and alignment. Intervertebral disc spaces are preserved.  Small anterior disc osteophyte complexes are noted along the cervical spine. Prevertebral soft tissues are within normal limits.  The visualized neural foramina are grossly unremarkable.  The thyroid gland is unremarkable in appearance.  Diffuse interstitial prominence is noted at the lung apices, with small bilateral pleural effusions, concerning for pulmonary edema.  Mild calcification is noted along the left vertebral artery at the level of C2.  IMPRESSION:  1.  No evidence of fracture or subluxation along the cervical spine. 2.  Diffuse interstitial prominence of the lung apices, with small bilateral pleural effusions, concerning for pulmonary edema.   Original Report Authenticated By: Tonia Ghent, M.D.    Dg Pelvis Portable  03/31/2012  *RADIOLOGY REPORT*  Clinical Data: Postop  PORTABLE PELVIS  Comparison: 03/30/2012.  Findings: Portable film demonstrates satisfactory appearance status post left hemiarthroplasty.  IMPRESSION: Satisfactory position and alignment.   Original Report Authenticated By: Davonna Belling, M.D.    Dg Chest Port 1 View  04/01/2012  *RADIOLOGY REPORT*  Clinical Data: Fever.  PORTABLE CHEST - 1 VIEW  Comparison: Chest radiograph performed 03/30/2012  Findings: The lungs are mildly hypoexpanded.  Vascular congestion is noted, with mild increased interstitial markings, raise concern for mild pulmonary edema.  This is grossly stable from the prior study.  There is no evidence of pleural effusion or pneumothorax.  The cardiomediastinal silhouette is borderline normal in size.  No acute osseous abnormalities are seen.  IMPRESSION: Lungs mildly hypoexpanded.  Vascular congestion, with mildly increased interstitial markings, raising concern for mild pulmonary  edema.  This appears relatively stable from the recent prior study.   Original Report Authenticated By: Tonia Ghent, M.D.    Dg Chest Port 1 View  03/30/2012  *RADIOLOGY REPORT*  Clinical Data: Preoperative chest radiograph.  PORTABLE CHEST - 1 VIEW  Comparison: Chest radiograph performed 02/26/2012  Findings: The lungs are well-aerated.  Vascular congestion is noted.  Bilateral central and bibasilar airspace opacities may reflect mild pulmonary edema.  There is no pleural effusion or pneumothorax.  The cardiomediastinal silhouette is borderline normal in size.  No acute osseous abnormalities are seen.  IMPRESSION: Vascular congestion noted.  Bilateral central and bibasilar airspace opacities may reflect mild pulmonary edema.  This may be transient, due to the patient's recent fall.   Original Report Authenticated By: Tonia Ghent, M.D.          Subjective: Patient continues to refuse diagnostic studies as well as blood draws.  He has tolerated some medications and a CBG today. He remains intermittently agitated. He denies any fevers, chills, chest pain, shortness breath. He complains of pain in his left hip. No reports of vomiting or diarrhea today. No abdominal pain.  Perseverates on wanting to be given asylum in another country with housing, food.  Is asking about visas and passports.    Objective: Filed Vitals:   04/04/12 2100 04/05/12 1414 04/05/12 2200 04/06/12 0600  BP: 170/75 168/83 132/65 168/72  Pulse: 76 62 60 72  Temp: 98 F (36.7 C) 98.1 F (36.7 C) 98.5 F (36.9 C) 98.6 F (37 C)  TempSrc: Oral Oral Oral Oral  Resp: 18  18 18 18   Height:      Weight:      SpO2: 94% 98% 98% 100%    Intake/Output Summary (Last 24 hours) at 04/06/12 1408 Last data filed at 04/06/12 0309  Gross per 24 hour  Intake      0 ml  Output    650 ml  Net   -650 ml   Weight change:  Exam:   General:  Pt is alert, follows commands appropriately, not in acute distress  HEENT: No icterus,  No thrush, [place]/AT  Cardiovascular: RRR, S1/S2, no rubs, no gallops  Respiratory: CTAB, no increased WOB  Abdomen: Soft/+BS, non tender, non distended, no guarding  Extremities: 1+ LLE, 2+ RLE edema, No lymphangitis, No petechiae, No rashes, no synovitis.  Staples c/d/i along left hip.  No erythema or induration.    Data Reviewed: Basic Metabolic Panel:  Recent Labs Lab 03/31/12 0454 04/01/12 0505 04/02/12 0530 04/03/12 0506 04/04/12 0445  NA 134* 133* 134* 132* 135  K 4.2 4.5 5.4* 4.5 4.2  CL 100 98 102 97 100  CO2 24 24 21 23 25   GLUCOSE 149* 163* 111* 132* 128*  BUN 22 23 26* 24* 21  CREATININE 1.62* 1.64* 1.99* 1.64* 1.38*  CALCIUM 8.3* 8.2* 7.8* 8.4 8.3*  MG  --   --   --  2.2  --   PHOS  --   --   --  3.3  --    Liver Function Tests: No results found for this basename: AST, ALT, ALKPHOS, BILITOT, PROT, ALBUMIN,  in the last 168 hours No results found for this basename: LIPASE, AMYLASE,  in the last 168 hours No results found for this basename: AMMONIA,  in the last 168 hours CBC:  Recent Labs Lab 04/01/12 0505 04/01/12 2315 04/02/12 0530 04/03/12 0506 04/04/12 0445  WBC 10.3 11.0* 11.8* 12.8* 12.7*  NEUTROABS  --  6.8  --   --   --   HGB 10.7* 9.1* 9.3* 10.3* 10.2*  HCT 33.3* 28.3* 28.6* 32.0* 31.4*  MCV 87.6 88.7 87.7 88.6 87.7  PLT 357 347 331 435* 490*   Cardiac Enzymes: No results found for this basename: CKTOTAL, CKMB, CKMBINDEX, TROPONINI,  in the last 168 hours BNP: No components found with this basename: POCBNP,  CBG:  Recent Labs Lab 04/03/12 2125 04/05/12 1711 04/05/12 2128 04/06/12 0749 04/06/12 1208  GLUCAP 117* 88 140* 113* 136*    Recent Results (from the past 240 hour(s))  URINE CULTURE     Status: None   Collection Time    04/01/12 11:09 PM      Result Value Range Status   Specimen Description URINE, CATHETERIZED   Final   Special Requests NONE   Final   Culture  Setup Time 04/02/2012 03:05   Final   Colony Count NO GROWTH    Final   Culture NO GROWTH   Final   Report Status 04/03/2012 FINAL   Final  CULTURE, BLOOD (ROUTINE X 2)     Status: None   Collection Time    04/01/12 11:15 PM      Result Value Range Status   Specimen Description BLOOD RIGHT ANTECUBITAL   Final   Special Requests BOTTLES DRAWN AEROBIC AND ANAEROBIC 4CC   Final   Culture  Setup Time 04/02/2012 03:06   Final   Culture     Final   Value:        BLOOD CULTURE RECEIVED NO GROWTH TO DATE CULTURE  WILL BE HELD FOR 5 DAYS BEFORE ISSUING A FINAL NEGATIVE REPORT   Report Status PENDING   Incomplete  CULTURE, BLOOD (ROUTINE X 2)     Status: None   Collection Time    04/01/12 11:24 PM      Result Value Range Status   Specimen Description BLOOD RIGHT HAND   Final   Special Requests BOTTLES DRAWN AEROBIC AND ANAEROBIC 3CC   Final   Culture  Setup Time 04/02/2012 03:06   Final   Culture     Final   Value:        BLOOD CULTURE RECEIVED NO GROWTH TO DATE CULTURE WILL BE HELD FOR 5 DAYS BEFORE ISSUING A FINAL NEGATIVE REPORT   Report Status PENDING   Incomplete     Scheduled Meds: . amLODipine  5 mg Oral QHS  . ARIPiprazole  5 mg Oral Daily  . aspirin EC  325 mg Oral BID  . atenolol  100 mg Oral Daily  . clonazePAM  0.25 mg Oral BID  . docusate sodium  100 mg Oral BID  . feeding supplement  237 mL Oral TID BM  . ferrous sulfate  325 mg Oral BID WC  . haloperidol  10 mg Oral BID  . haloperidol  5 mg Oral Once  . insulin aspart  0-15 Units Subcutaneous TID WC  . insulin aspart  0-5 Units Subcutaneous QHS  . levofloxacin  750 mg Oral Daily  . OXcarbazepine  450 mg Oral BID  . pantoprazole  40 mg Oral Daily  . trihexyphenidyl  5 mg Oral BID WC  . vitamin B-12  1,000 mcg Oral Daily   Continuous Infusions:     Renae Fickle, MD  [facility] Pager [phone]  If 7PM-7AM, please contact night-coverage [email or web address] Password [email or web address] 04/06/2012, 2:08 PM   LOS: 7 days

## 2012-04-06 NOTE — Progress Notes (Signed)
PATIENT ID: Merlyn Albert Sorter   6 Days Post-Op Procedure(s) (LRB): ARTHROPLASTY BIPOLAR HIP (Left)  Subjective: No sig pain in hip.  Ambulated in hallway with PT.  Objective:  Filed Vitals:   04/06/12 0600  BP: 168/72  Pulse: 72  Temp: 98.6 F (37 C)  Resp: 18     L hip inc c/d/i NVID No calf TTP or swelling.  Labs:   Recent Labs  04/04/12 0445  HGB 10.2*   Recent Labs  04/04/12 0445  WBC 12.7*  RBC 3.58*  HCT 31.4*  PLT 490*   Recent Labs  04/04/12 0445  NA 135  K 4.2  CL 100  CO2 25  BUN 21  CREATININE 1.38*  GLUCOSE 128*  CALCIUM 8.3*    Assessment and Plan:S/p L hip hemi Cont WBAT, post hip precautions  VTE proph: ASA BID and SCDs.

## 2012-04-07 LAB — GLUCOSE, CAPILLARY
Glucose-Capillary: 73 mg/dL (ref 70–99)
Glucose-Capillary: 60 mg/dL — ABNORMAL LOW (ref 70–99)
Glucose-Capillary: 68 mg/dL — ABNORMAL LOW (ref 70–99)

## 2012-04-07 MED ORDER — AMLODIPINE BESYLATE 10 MG PO TABS
10.0000 mg | ORAL_TABLET | Freq: Every day | ORAL | Status: DC
  Administered 2012-04-08 – 2012-04-18 (×11): 10 mg via ORAL
  Filled 2012-04-07 (×11): qty 1

## 2012-04-07 MED ORDER — GLUCAGON HCL (RDNA) 1 MG IJ SOLR: 1.0000 mg | Freq: Once | INTRAMUSCULAR | Status: AC | PRN

## 2012-04-07 MED ORDER — CALCIUM CARBONATE 1250 (500 CA) MG PO TABS
1.0000 | ORAL_TABLET | Freq: Every day | ORAL | Status: DC
  Administered 2012-04-09 – 2012-04-18 (×9): 500 mg via ORAL
  Filled 2012-04-07 (×13): qty 1

## 2012-04-07 MED ORDER — GLUCAGON HCL (RDNA) 1 MG IJ SOLR: 0.5000 mg | Freq: Once | INTRAMUSCULAR | Status: AC | PRN

## 2012-04-07 MED ORDER — BOOST / RESOURCE BREEZE PO LIQD
1.0000 | Freq: Three times a day (TID) | ORAL | Status: DC
  Administered 2012-04-07 – 2012-04-14 (×8): 1 via ORAL

## 2012-04-07 MED ORDER — ADULT MULTIVITAMIN W/MINERALS CH
1.0000 | ORAL_TABLET | Freq: Every day | ORAL | Status: DC
  Administered 2012-04-07 – 2012-04-18 (×10): 1 via ORAL
  Filled 2012-04-07 (×13): qty 1
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
## 2012-04-07 NOTE — Progress Notes (Signed)
NUTRITION FOLLOW UP  Intervention:   - D/C Glucerna shakes - Resource Breeze TID - Nursing to encourage increased meal intake - If PO intake does not improve, recommend MD discuss enteral nutrition with pt and family - Multivitamin PO 1 tablet daily - Will continue to monitor   Nutrition Dx:   Inadequate oral intake related to inability to eat as evidenced by NPO - no longer appropriate, diet advanced.   New nutrition dx: Inadequate oral intake related to poor appetite, refusing to eat as evidenced by 0-5% meal intake.   Goal:   Advance diet as tolerated to diabetic diet - met  New goal: Pt to consume >50% of meals/supplements.   Monitor:   Weights, labs, intake  Assessment:   POD# 7 left hip hemiarthroplasty. Pt soundly during visit, RN encouraged not to wake. Pt has consumed only 0-5% of meals. Refused Glucerna shake. Requested RN to get him 7 musketeers, 7 mars bars, and 7 hershey bars (which were not provided as pt diabetic). Pt told RN he paid somebody $150 to get these candies for him. (RN reports this did not happen). Pt likes orange juice, will order juice flavored supplement Resource Breeze.   Height: Ht Readings from Last 1 Encounters:  03/31/12 5' 9.5" (1.765 m)    Weight Status:   Wt Readings from Last 1 Encounters:  03/31/12 178 lb (80.74 kg)    Re-estimated needs:  Kcal: 1900-2050 Protein: 85-100g Fluid: 1.9-2L/day  Skin: Small left facial laceration   Diet Order: Carb Control   Intake/Output Summary (Last 24 hours) at 04/07/12 1451 Last data filed at 04/06/12 1500  Gross per 24 hour  Intake      0 ml  Output    400 ml  Net   -400 ml    Last BM: 3/10   Labs:   Recent Labs Lab 04/02/12 0530 04/03/12 0506 04/04/12 0445  NA 134* 132* 135  K 5.4* 4.5 4.2  CL 102 97 100  CO2 21 23 25   BUN 26* 24* 21  CREATININE 1.99* 1.64* 1.38*  CALCIUM 7.8* 8.4 8.3*  MG  --  2.2  --   PHOS  --  3.3  --   GLUCOSE 111* 132* 128*    CBG (last 3)    Recent Labs  04/06/12 2206 04/07/12 0752 04/07/12 1140  GLUCAP 85 85 73    Scheduled Meds: . [START ON 04/08/2012] amLODipine  10 mg Oral Daily  . ARIPiprazole  5 mg Oral Daily  . aspirin EC  325 mg Oral BID  . atenolol  100 mg Oral Daily  . [START ON 04/08/2012] calcium carbonate  1 tablet Oral Q breakfast  . clonazePAM  0.25 mg Oral BID  . docusate sodium  100 mg Oral BID  . feeding supplement  237 mL Oral TID BM  . ferrous sulfate  325 mg Oral BID WC  . haloperidol  10 mg Oral BID  . haloperidol  5 mg Oral Once  . insulin aspart  0-15 Units Subcutaneous TID WC  . insulin aspart  0-5 Units Subcutaneous QHS  . levofloxacin  750 mg Oral Daily  . OXcarbazepine  450 mg Oral BID  . pantoprazole  40 mg Oral Daily  . trihexyphenidyl  5 mg Oral BID WC  . vitamin B-12  1,000 mcg Oral Daily     Levon Hedger MS, RD, LDN [phone] Pager [phone] After Hours Pager

## 2012-04-07 NOTE — Progress Notes (Signed)
Level 2 passar is completed. CSW called and left messages for both [facility] and [facility]. Still not bed offers at this time.  Bethany C. Corcoran MSW, LCSW [phone]

## 2012-04-07 NOTE — Progress Notes (Signed)
Physical Therapy Treatment Patient Details Name: Bharat Antillon MRN: [id number] DOB: Dec 30, 1950 Today's Date: 04/07/2012 Time: 9528-4132 PT Time Calculation (min): 25 min  PT Assessment / Plan / Recommendation Comments on Treatment Session  POD # 7 L bipolor hip 2nd fall/fx.  Pt has a hx of bipolor/schizophrenia and is difficult to reason with to participate and easily gets aggitated. Was unable to get pt to amb as he stated, "I wish to do as little as possible".  Was only able to get pt OOB briefly because he spilled his urinal.  Pt insisted he needed to speak with the President of the [place] before he does anything.     Follow Up Recommendations  SNF     Does the patient have the potential to tolerate intense rehabilitation     Barriers to Discharge        Equipment Recommendations  Rolling walker with 5" wheels    Recommendations for Other Services    Frequency Min 5X/week   Plan Discharge plan remains appropriate    Precautions / Restrictions Precautions Precautions: Posterior Hip;Fall Precaution Comments: Pt unaware of his THP Restrictions Weight Bearing Restrictions: Yes LLE Weight Bearing: Weight bearing as tolerated Other Position/Activity Restrictions: WBAT   Pertinent Vitals/Pain No c/o pain    Mobility  Bed Mobility Bed Mobility: Supine to Sit;Sit to Supine Supine to Sit: 1: +2 Total assist Supine to Sit: Patient Percentage: 40% Sitting - Scoot to Edge of Bed: 1: +1 Total assist Sit to Supine: 1: +2 Total assist Sit to Supine: Patient Percentage: 40% Details for Bed Mobility Assistance: Pt required increased time and MAX encouragement to participate.  It wasn't until he spilled his urinal that he finally agreed to get up to change bed.  Pt was still resistant and unreasonable. Transfers Transfers: Sit to Stand;Stand to Sit Sit to Stand: 1: +2 Total assist;From bed;From elevated surface;From chair/3-in-1 Sit to Stand: Patient Percentage: 60% Stand to Sit:  1: +2 Total assist;To chair/3-in-1;To bed Stand to Sit: Patient Percentage: 60% Details for Transfer Assistance: Multimodal cues for safety, technique, hand placement, L LE positioning. Assist to rise, stabilize control descent, maneuver with RW.  Ambulation/Gait Ambulation/Gait Assistance Details: pt declined to amb stating "I wish to do as little as possible".     PT Goals                                  Progressing slowly with mobility    Visit Information  Last PT Received On: 04/07/12 Assistance Needed: +2    Subjective Data  Subjective: I wish to do as little as possible   Cognition    impaired   Balance     End of Session PT - End of Session Equipment Utilized During Treatment: Gait belt Activity Tolerance: Patient limited by fatigue;Patient limited by pain Patient left: in bed;with call bell/phone within reach   Felecia Shelling  PTA [facility]  Acute  Rehab Pager      [phone]

## 2012-04-07 NOTE — Progress Notes (Signed)
Hypoglycemic Event  CBG: 60  Treatment: 15 GM carbohydrate snack  Symptoms: None  Follow-up CBG: Time:1900 CBG Result:68 Possible Reasons for Event: Inadequate meal intake  Comments/MD notified: Dr  Quenten Raven, [facility]re[facility]  Remember to initiate Hypoglycemia Order Set & complete

## 2012-04-07 NOTE — Progress Notes (Signed)
PATIENT ID: David Lang   7 Days Post-Op Procedure(s) (LRB): ARTHROPLASTY BIPOLAR HIP (Left)  Subjective: Answers questions this am. No sig pain in hip. Ambulated in hallway with PT. No other complaints or concerns.  Objective:  Filed Vitals:   04/07/12 0700  BP: 161/77  Pulse: 72  Temp: 98.6 F (37 C)  Resp: 18     L hip incision benign, no swelling, erythema, warmth NVID  Labs:  No results found for this basename: HGB,  in the last 72 hoursNo results found for this basename: WBC, RBC, HCT, PLT,  in the last 72 hoursNo results found for this basename: NA, K, CL, CO2, BUN, CREATININE, GLUCOSE, CALCIUM,  in the last 72 hours  Assessment and Plan: 7 days s/p left hip hemiarthroplasty WBAT,posterior hip precautions, continue knee immobilizer when not with PT Cont PT  VTE proph: ASA BID, SCDs

## 2012-04-07 NOTE — Progress Notes (Signed)
Urine noted in the floor 2 different times today.  Pt asking about getting him food from a store.  He will not eat anything here and refuses to drink the supplements

## 2012-04-07 NOTE — Progress Notes (Signed)
[facility] PROGRESS NOTE  David Lang ZOX:[id number] DOB: May 03, 1950 DOA: 03/30/2012 PCP: Default, Provider, MD   Assessment/Plan:  Paranoid schizophrenia with mild agitation, resolving.   - Appreciate Dr. Ferol Luz assistance -  IVC paperwork completed 3/12.   -  Patient taking some of his oral medications when encouraged -  Cont abilify 5mg  daily as above -  Haldol 10mg  po BID -  Triletpal 450mg  BID -  Artane 5mg  BID -  Clonazepam BID -  Haldol IM prn agitation  HCAP/Hypoxemia  -Blood cultures are obtained--negative to date  -Empiric vancomycin and Zosyn--> switch to Levaquin as discussed above (04/04/2012)  -last day for oral levofloxacin to be on 04/08/12  Left subcapital femoral neck fracture s/p Left hip hemiarthroplasty, 03/31/2012, healing well -Appreciate orthopedics  - Continue PT  -  Weight bearing at tolerated LLE  CKD stage 3 -  Minimize nephrotoxins -  Renally dose medications if needed.  Hypertension BP mildly elevated - Initially held antihypertensives, however restarted on 3/10 -  BP elevated however patient refusing some medications.    -  Continue atenolol and increase norvasc -  Will defer ACE/ARB as patient has not allowed lab draws and may not be able to monitor potassium and kidney function after initiating.  Diabetes mellitus type 2  - Hemoglobin A1c 8.8 on 03/22/2012  -Continue NovoLog sliding scale  -Patient was not on any medications during his February admission   Anemia due to b12 and iron deficiency -Repeat serum B12 as this was marginally low on 03/22/2012--> repeat B12 on 04/01/2012--276  -Continue oral B12  -continue ferrous sulfate - Needs outpatient GI follow up -Check RBC folate-655  Patient requesting that calcium be restarted.  DIET:  Diabetic ACCESS:  None IVF:  OFF PROPH:  SCDs  CODE: [place] Family Communication: Pt at beside  Disposition Plan:  Medically ready for transfer to SNF once bed available.      Antibiotics:  Vancomycin/Zosyn 04/02/2012>>>04/04/12 Levofloxacin 04/05/12>>> 3/14   Procedures/Studies: Dg Hip Complete Left  03/30/2012  s*RADIOLOGY REPORT*  Clinical Data: Status post fall; left hip pain.  LEFT HIP - COMPLETE 2+ VIEW  Comparison: None.  Findings: There is a mildly displaced subcapital fracture involving the left femoral neck, with mild rotation of the femoral head.  The left femoral head remains seated at the acetabulum.  The right hip joint is unremarkable in appearance.  The sacroiliac joints are unremarkable in appearance.  The visualized bowel gas pattern is grossly unremarkable.  IMPRESSION: Mildly displaced subcapital fracture involving the left femoral neck.   Original Report Authenticated By: Tonia Ghent, M.D.    Ct Head Wo Contrast  03/30/2012  *RADIOLOGY REPORT*  Clinical Data:  Status post fall; laceration above the left eye. Concern for head or cervical spine injury.  CT HEAD WITHOUT CONTRAST AND CT CERVICAL SPINE WITHOUT CONTRAST  Technique:  Multidetector CT imaging of the head and cervical spine was performed following the standard protocol without intravenous contrast.  Multiplanar CT image reconstructions of the cervical spine were also generated.  Comparison: None  CT HEAD  Findings: There is no evidence of acute infarction, mass lesion, or intra- or extra-axial hemorrhage on CT.  Prominence of the sulci suggests mild cortical volume loss. Relatively diffuse periventricular and subcortical white matter change likely reflects small vessel ischemic microangiopathy. Cerebellar atrophy is noted.  A small chronic lacunar infarct is noted at the anterior right thalamus.  Chronic ischemic change is seen at the external capsule bilaterally.  The brainstem and  fourth ventricle are within normal limits.  The cerebral hemispheres demonstrate grossly normal gray-white differentiation.  No mass effect or midline shift is seen.   There is no evidence of fracture; visualized  osseous structures are unremarkable in appearance.  The orbits are within normal limits.  The paranasal sinuses and mastoid air cells are well- aerated.  The known soft tissue laceration is not well characterized on CT.  IMPRESSION:  1.  No evidence of traumatic intracranial injury or fracture. 2.  Mild cortical volume loss and diffuse small vessel ischemic microangiopathy. 3.  Small chronic infarct at the anterior right thalamus, and chronic ischemic change at the external capsule bilaterally.  CT CERVICAL SPINE  Findings: There is no evidence of fracture or subluxation. Vertebral bodies demonstrate normal height and alignment. Intervertebral disc spaces are preserved.  Small anterior disc osteophyte complexes are noted along the cervical spine. Prevertebral soft tissues are within normal limits.  The visualized neural foramina are grossly unremarkable.  The thyroid gland is unremarkable in appearance.  Diffuse interstitial prominence is noted at the lung apices, with small bilateral pleural effusions, concerning for pulmonary edema.  Mild calcification is noted along the left vertebral artery at the level of C2.  IMPRESSION:  1.  No evidence of fracture or subluxation along the cervical spine. 2.  Diffuse interstitial prominence of the lung apices, with small bilateral pleural effusions, concerning for pulmonary edema.   Original Report Authenticated By: Tonia Ghent, M.D.    Ct Cervical Spine Wo Contrast  03/30/2012  *RADIOLOGY REPORT*  Clinical Data:  Status post fall; laceration above the left eye. Concern for head or cervical spine injury.  CT HEAD WITHOUT CONTRAST AND CT CERVICAL SPINE WITHOUT CONTRAST  Technique:  Multidetector CT imaging of the head and cervical spine was performed following the standard protocol without intravenous contrast.  Multiplanar CT image reconstructions of the cervical spine were also generated.  Comparison: None  CT HEAD  Findings: There is no evidence of acute infarction,  mass lesion, or intra- or extra-axial hemorrhage on CT.  Prominence of the sulci suggests mild cortical volume loss. Relatively diffuse periventricular and subcortical white matter change likely reflects small vessel ischemic microangiopathy. Cerebellar atrophy is noted.  A small chronic lacunar infarct is noted at the anterior right thalamus.  Chronic ischemic change is seen at the external capsule bilaterally.  The brainstem and fourth ventricle are within normal limits.  The cerebral hemispheres demonstrate grossly normal gray-white differentiation.  No mass effect or midline shift is seen.   There is no evidence of fracture; visualized osseous structures are unremarkable in appearance.  The orbits are within normal limits.  The paranasal sinuses and mastoid air cells are well- aerated.  The known soft tissue laceration is not well characterized on CT.  IMPRESSION:  1.  No evidence of traumatic intracranial injury or fracture. 2.  Mild cortical volume loss and diffuse small vessel ischemic microangiopathy. 3.  Small chronic infarct at the anterior right thalamus, and chronic ischemic change at the external capsule bilaterally.  CT CERVICAL SPINE  Findings: There is no evidence of fracture or subluxation. Vertebral bodies demonstrate normal height and alignment. Intervertebral disc spaces are preserved.  Small anterior disc osteophyte complexes are noted along the cervical spine. Prevertebral soft tissues are within normal limits.  The visualized neural foramina are grossly unremarkable.  The thyroid gland is unremarkable in appearance.  Diffuse interstitial prominence is noted at the lung apices, with small bilateral pleural effusions, concerning for pulmonary  edema.  Mild calcification is noted along the left vertebral artery at the level of C2.  IMPRESSION:  1.  No evidence of fracture or subluxation along the cervical spine. 2.  Diffuse interstitial prominence of the lung apices, with small bilateral pleural  effusions, concerning for pulmonary edema.   Original Report Authenticated By: Tonia Ghent, M.D.    Dg Pelvis Portable  03/31/2012  *RADIOLOGY REPORT*  Clinical Data: Postop  PORTABLE PELVIS  Comparison: 03/30/2012.  Findings: Portable film demonstrates satisfactory appearance status post left hemiarthroplasty.  IMPRESSION: Satisfactory position and alignment.   Original Report Authenticated By: Davonna Belling, M.D.    Dg Chest Port 1 View  04/01/2012  *RADIOLOGY REPORT*  Clinical Data: Fever.  PORTABLE CHEST - 1 VIEW  Comparison: Chest radiograph performed 03/30/2012  Findings: The lungs are mildly hypoexpanded.  Vascular congestion is noted, with mild increased interstitial markings, raise concern for mild pulmonary edema.  This is grossly stable from the prior study.  There is no evidence of pleural effusion or pneumothorax.  The cardiomediastinal silhouette is borderline normal in size.  No acute osseous abnormalities are seen.  IMPRESSION: Lungs mildly hypoexpanded.  Vascular congestion, with mildly increased interstitial markings, raising concern for mild pulmonary edema.  This appears relatively stable from the recent prior study.   Original Report Authenticated By: Tonia Ghent, M.D.    Dg Chest Port 1 View  03/30/2012  *RADIOLOGY REPORT*  Clinical Data: Preoperative chest radiograph.  PORTABLE CHEST - 1 VIEW  Comparison: Chest radiograph performed 02/26/2012  Findings: The lungs are well-aerated.  Vascular congestion is noted.  Bilateral central and bibasilar airspace opacities may reflect mild pulmonary edema.  There is no pleural effusion or pneumothorax.  The cardiomediastinal silhouette is borderline normal in size.  No acute osseous abnormalities are seen.  IMPRESSION: Vascular congestion noted.  Bilateral central and bibasilar airspace opacities may reflect mild pulmonary edema.  This may be transient, due to the patient's recent fall.   Original Report Authenticated By: Tonia Ghent, M.D.         Subjective: Denies fevers, chills, chest pain, shortness breath.  Improving pain in his left hip.  Denies N/V/C/D.  Asks that I restart his calcium.    Objective: Filed Vitals:   04/06/12 0600 04/06/12 1606 04/06/12 2200 04/07/12 0700  BP: 168/72 98/62 189/81 161/77  Pulse: 72 61 70 72  Temp: 98.6 F (37 C) 97.8 F (36.6 C) 98.4 F (36.9 C) 98.6 F (37 C)  TempSrc: Oral Oral Oral Oral  Resp: 18 18 18 18   Height:      Weight:      SpO2: 100% 98% 96% 98%    Intake/Output Summary (Last 24 hours) at 04/07/12 1054 Last data filed at 04/06/12 1500  Gross per 24 hour  Intake      0 ml  Output    400 ml  Net   -400 ml   Weight change:  Exam:   General:  Pt is alert, no acute distress  HEENT: No icterus, No thrush, [place]/AT  Cardiovascular: RRR, S1/S2, no rubs, no gallops  Respiratory: CTAB, no increased WOB  Abdomen: Soft/+BS, non tender, non distended, no guarding  Extremities: 1+ bilateral LEE, Staples c/d/i along left hip.  No erythema or induration.    Data Reviewed: Basic Metabolic Panel:  Recent Labs Lab 04/01/12 0505 04/02/12 0530 04/03/12 0506 04/04/12 0445  NA 133* 134* 132* 135  K 4.5 5.4* 4.5 4.2  CL 98 102 97 100  CO2  24 21 23 25   GLUCOSE 163* 111* 132* 128*  BUN 23 26* 24* 21  CREATININE 1.64* 1.99* 1.64* 1.38*  CALCIUM 8.2* 7.8* 8.4 8.3*  MG  --   --  2.2  --   PHOS  --   --  3.3  --    Liver Function Tests: No results found for this basename: AST, ALT, ALKPHOS, BILITOT, PROT, ALBUMIN,  in the last 168 hours No results found for this basename: LIPASE, AMYLASE,  in the last 168 hours No results found for this basename: AMMONIA,  in the last 168 hours CBC:  Recent Labs Lab 04/01/12 0505 04/01/12 2315 04/02/12 0530 04/03/12 0506 04/04/12 0445  WBC 10.3 11.0* 11.8* 12.8* 12.7*  NEUTROABS  --  6.8  --   --   --   HGB 10.7* 9.1* 9.3* 10.3* 10.2*  HCT 33.3* 28.3* 28.6* 32.0* 31.4*  MCV 87.6 88.7 87.7 88.6 87.7  PLT 357 347 331  435* 490*   Cardiac Enzymes: No results found for this basename: CKTOTAL, CKMB, CKMBINDEX, TROPONINI,  in the last 168 hours BNP: No components found with this basename: POCBNP,  CBG:  Recent Labs Lab 04/06/12 0749 04/06/12 1208 04/06/12 1649 04/06/12 2206 04/07/12 0752  GLUCAP 113* 136* 78 85 85    Recent Results (from the past 240 hour(s))  URINE CULTURE     Status: None   Collection Time    04/01/12 11:09 PM      Result Value Range Status   Specimen Description URINE, CATHETERIZED   Final   Special Requests NONE   Final   Culture  Setup Time 04/02/2012 03:05   Final   Colony Count NO GROWTH   Final   Culture NO GROWTH   Final   Report Status 04/03/2012 FINAL   Final  CULTURE, BLOOD (ROUTINE X 2)     Status: None   Collection Time    04/01/12 11:15 PM      Result Value Range Status   Specimen Description BLOOD RIGHT ANTECUBITAL   Final   Special Requests BOTTLES DRAWN AEROBIC AND ANAEROBIC 4CC   Final   Culture  Setup Time 04/02/2012 03:06   Final   Culture     Final   Value:        BLOOD CULTURE RECEIVED NO GROWTH TO DATE CULTURE WILL BE HELD FOR 5 DAYS BEFORE ISSUING A FINAL NEGATIVE REPORT   Report Status PENDING   Incomplete  CULTURE, BLOOD (ROUTINE X 2)     Status: None   Collection Time    04/01/12 11:24 PM      Result Value Range Status   Specimen Description BLOOD RIGHT HAND   Final   Special Requests BOTTLES DRAWN AEROBIC AND ANAEROBIC 3CC   Final   Culture  Setup Time 04/02/2012 03:06   Final   Culture     Final   Value:        BLOOD CULTURE RECEIVED NO GROWTH TO DATE CULTURE WILL BE HELD FOR 5 DAYS BEFORE ISSUING A FINAL NEGATIVE REPORT   Report Status PENDING   Incomplete     Scheduled Meds: . amLODipine  5 mg Oral QHS  . ARIPiprazole  5 mg Oral Daily  . aspirin EC  325 mg Oral BID  . atenolol  100 mg Oral Daily  . clonazePAM  0.25 mg Oral BID  . docusate sodium  100 mg Oral BID  . feeding supplement  237 mL Oral TID BM  .  ferrous sulfate  325  mg Oral BID WC  . haloperidol  10 mg Oral BID  . haloperidol  5 mg Oral Once  . insulin aspart  0-15 Units Subcutaneous TID WC  . insulin aspart  0-5 Units Subcutaneous QHS  . levofloxacin  750 mg Oral Daily  . OXcarbazepine  450 mg Oral BID  . pantoprazole  40 mg Oral Daily  . trihexyphenidyl  5 mg Oral BID WC  . vitamin B-12  1,000 mcg Oral Daily   Continuous Infusions:     Renae Fickle, MD  [facility] Pager [phone]  If 7PM-7AM, please contact night-coverage [email or web address] Password [facility] 04/07/2012, 10:54 AM   LOS: 8 days
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
## 2012-04-08 LAB — CULTURE, BLOOD (ROUTINE X 2): Culture: NO GROWTH

## 2012-04-08 LAB — GLUCOSE, CAPILLARY: Glucose-Capillary: 101 mg/dL — ABNORMAL HIGH (ref 70–99)

## 2012-04-08 MED ORDER — HEPARIN SODIUM (PORCINE) 5000 UNIT/ML IJ SOLN
5000.0000 [IU] | Freq: Three times a day (TID) | INTRAMUSCULAR | Status: DC
  Administered 2012-04-08: 5000 [IU] via SUBCUTANEOUS
  Filled 2012-04-08 (×33): qty 1

## 2012-04-08 MED ORDER — INSULIN ASPART 100 UNIT/ML ~~LOC~~ SOLN
0.0000 [IU] | Freq: Three times a day (TID) | SUBCUTANEOUS | Status: DC
  Administered 2012-04-11: 2 [IU] via SUBCUTANEOUS
  Administered 2012-04-11: 1 [IU] via SUBCUTANEOUS
  Administered 2012-04-14: 2 [IU] via SUBCUTANEOUS
  Administered 2012-04-15: 1 [IU] via SUBCUTANEOUS
  Administered 2012-04-16: 2 [IU] via SUBCUTANEOUS

## 2012-04-08 MED ORDER — CLONIDINE HCL 0.1 MG/24HR TD PTWK
0.1000 mg | MEDICATED_PATCH | TRANSDERMAL | Status: DC
  Administered 2012-04-08: 0.1 mg via TRANSDERMAL
  Filled 2012-04-08: qty 1

## 2012-04-08 MED ORDER — HYDRALAZINE HCL 25 MG PO TABS
25.0000 mg | ORAL_TABLET | Freq: Three times a day (TID) | ORAL | Status: DC
  Filled 2012-04-08 (×3): qty 1

## 2012-04-08 NOTE — Progress Notes (Signed)
CSW continues to expand snf search. Still no offers at this time.  Bethany C. Corcoran MSW, LCSW [phone]

## 2012-04-08 NOTE — Progress Notes (Addendum)
[facility] PROGRESS NOTE  David Lang ZOX:[id number] DOB: 01/25/1951 DOA: 03/30/2012 PCP: Brei Pociask, MD   Assessment/Plan:  Paranoid schizophrenia with worsening confusion, paranoia and disorganized thinking making medical care difficult and patient unsafe.   -  IVC paperwork completed 3/12.   -  Patient taking oral medications when encouraged but still refusing to eat -  Cont abilify 5mg  daily as above -  Haldol 10mg  po BID -  Triletpal 450mg  BID -  Artane 5mg  BID -  Clonazepam BID -  Haldol IM prn agitation  Refusal to eat:  Appears to be paranoid about his food being contaminated -  Liberalized diet.  Nursing staff to bring him whatever he wants to eat -  Supplements -  Nutrition brought prepackaged foods   Hypoglycemia, likely from not eating in many days -  Given candy yesterday and improved -  Ordered glucagon prn, but patient high risk for more hypoglycemic episodes because he refuses to eat, refuses to have [place] check his fingersticks regularly, and refuses IV placement and labs.   -  Patient would not tolerate NG tube placement as he did not tolerate IV placement and has ripped out his IVs.    HCAP/Hypoxemia  -  Today is last day for oral levofloxacin 7/7.  Left subcapital femoral neck fracture s/p Left hip hemiarthroplasty, 03/31/2012.  Incision looks well, but patient not getting out of bed with PT.   -  Weight bearing at tolerated LLE per ortho  CKD stage 3 -  Minimize nephrotoxins -  Renally dose medications if needed.  Hypertension BP still elevated - Initially held antihypertensives, however restarted on 3/10  -  Continue atenolol and norvasc -  Add clonidine patch -  Will defer ACE/ARB as patient has not allowed lab draws and may not be able to monitor potassium and kidney function after initiating.  Diabetes mellitus type 2  - Hemoglobin A1c 8.8 on 03/22/2012  -Continue NovoLog sliding scale  -Patient was not on any medications during his  February admission   Anemia due to b12 and iron deficiency -Repeat serum B12 as this was marginally low on 03/22/2012--> repeat B12 on 04/01/2012--276  -Continue oral B12  -continue ferrous sulfate - Needs outpatient GI follow up -Check RBC folate-655  DIET:  Regular ACCESS:  None IVF:  OFF PROPH:  SCDs and add heparin as patient essentially bedbound.    CODELeighton Ruff Family Communication: Pt alone  Disposition Plan:  Sisters obtaining guardianship.  Spoke with sister, [name], [phone], who states it is okay to perform fingersticks and do tasks to protect his life against his will.  If procedures involve needles, she recommends against explaining the procedure to him prior to performing it.  Also spoke with representatives from Ethics committee who agreed with performing medically necessary procedures against patient's will if family gave permission.    Antibiotics:  Vancomycin/Zosyn 04/02/2012>>>04/04/12 Levofloxacin 04/05/12>>> 3/14   Procedures/Studies: Dg Hip Complete Left  03/30/2012  s*RADIOLOGY REPORT*  Clinical Data: Status post fall; left hip pain.  LEFT HIP - COMPLETE 2+ VIEW  Comparison: None.  Findings: There is a mildly displaced subcapital fracture involving the left femoral neck, with mild rotation of the femoral head.  The left femoral head remains seated at the acetabulum.  The right hip joint is unremarkable in appearance.  The sacroiliac joints are unremarkable in appearance.  The visualized bowel gas pattern is grossly unremarkable.  IMPRESSION: Mildly displaced subcapital fracture involving the left femoral neck.   Original  Report Authenticated By: Tonia Ghent, M.D.    Ct Head Wo Contrast  03/30/2012  *RADIOLOGY REPORT*  Clinical Data:  Status post fall; laceration above the left eye. Concern for head or cervical spine injury.  CT HEAD WITHOUT CONTRAST AND CT CERVICAL SPINE WITHOUT CONTRAST  Technique:  Multidetector CT imaging of the head and cervical spine  was performed following the standard protocol without intravenous contrast.  Multiplanar CT image reconstructions of the cervical spine were also generated.  Comparison: None  CT HEAD  Findings: There is no evidence of acute infarction, mass lesion, or intra- or extra-axial hemorrhage on CT.  Prominence of the sulci suggests mild cortical volume loss. Relatively diffuse periventricular and subcortical white matter change likely reflects small vessel ischemic microangiopathy. Cerebellar atrophy is noted.  A small chronic lacunar infarct is noted at the anterior right thalamus.  Chronic ischemic change is seen at the external capsule bilaterally.  The brainstem and fourth ventricle are within normal limits.  The cerebral hemispheres demonstrate grossly normal gray-white differentiation.  No mass effect or midline shift is seen.   There is no evidence of fracture; visualized osseous structures are unremarkable in appearance.  The orbits are within normal limits.  The paranasal sinuses and mastoid air cells are well- aerated.  The known soft tissue laceration is not well characterized on CT.  IMPRESSION:  1.  No evidence of traumatic intracranial injury or fracture. 2.  Mild cortical volume loss and diffuse small vessel ischemic microangiopathy. 3.  Small chronic infarct at the anterior right thalamus, and chronic ischemic change at the external capsule bilaterally.  CT CERVICAL SPINE  Findings: There is no evidence of fracture or subluxation. Vertebral bodies demonstrate normal height and alignment. Intervertebral disc spaces are preserved.  Small anterior disc osteophyte complexes are noted along the cervical spine. Prevertebral soft tissues are within normal limits.  The visualized neural foramina are grossly unremarkable.  The thyroid gland is unremarkable in appearance.  Diffuse interstitial prominence is noted at the lung apices, with small bilateral pleural effusions, concerning for pulmonary edema.  Mild  calcification is noted along the left vertebral artery at the level of C2.  IMPRESSION:  1.  No evidence of fracture or subluxation along the cervical spine. 2.  Diffuse interstitial prominence of the lung apices, with small bilateral pleural effusions, concerning for pulmonary edema.   Original Report Authenticated By: Tonia Ghent, M.D.    Ct Cervical Spine Wo Contrast  03/30/2012  *RADIOLOGY REPORT*  Clinical Data:  Status post fall; laceration above the left eye. Concern for head or cervical spine injury.  CT HEAD WITHOUT CONTRAST AND CT CERVICAL SPINE WITHOUT CONTRAST  Technique:  Multidetector CT imaging of the head and cervical spine was performed following the standard protocol without intravenous contrast.  Multiplanar CT image reconstructions of the cervical spine were also generated.  Comparison: None  CT HEAD  Findings: There is no evidence of acute infarction, mass lesion, or intra- or extra-axial hemorrhage on CT.  Prominence of the sulci suggests mild cortical volume loss. Relatively diffuse periventricular and subcortical white matter change likely reflects small vessel ischemic microangiopathy. Cerebellar atrophy is noted.  A small chronic lacunar infarct is noted at the anterior right thalamus.  Chronic ischemic change is seen at the external capsule bilaterally.  The brainstem and fourth ventricle are within normal limits.  The cerebral hemispheres demonstrate grossly normal gray-white differentiation.  No mass effect or midline shift is seen.   There is no evidence of  fracture; visualized osseous structures are unremarkable in appearance.  The orbits are within normal limits.  The paranasal sinuses and mastoid air cells are well- aerated.  The known soft tissue laceration is not well characterized on CT.  IMPRESSION:  1.  No evidence of traumatic intracranial injury or fracture. 2.  Mild cortical volume loss and diffuse small vessel ischemic microangiopathy. 3.  Small chronic infarct at the  anterior right thalamus, and chronic ischemic change at the external capsule bilaterally.  CT CERVICAL SPINE  Findings: There is no evidence of fracture or subluxation. Vertebral bodies demonstrate normal height and alignment. Intervertebral disc spaces are preserved.  Small anterior disc osteophyte complexes are noted along the cervical spine. Prevertebral soft tissues are within normal limits.  The visualized neural foramina are grossly unremarkable.  The thyroid gland is unremarkable in appearance.  Diffuse interstitial prominence is noted at the lung apices, with small bilateral pleural effusions, concerning for pulmonary edema.  Mild calcification is noted along the left vertebral artery at the level of C2.  IMPRESSION:  1.  No evidence of fracture or subluxation along the cervical spine. 2.  Diffuse interstitial prominence of the lung apices, with small bilateral pleural effusions, concerning for pulmonary edema.   Original Report Authenticated By: Tonia Ghent, M.D.    Dg Pelvis Portable  03/31/2012  *RADIOLOGY REPORT*  Clinical Data: Postop  PORTABLE PELVIS  Comparison: 03/30/2012.  Findings: Portable film demonstrates satisfactory appearance status post left hemiarthroplasty.  IMPRESSION: Satisfactory position and alignment.   Original Report Authenticated By: Davonna Belling, M.D.    Dg Chest Port 1 View  04/01/2012  *RADIOLOGY REPORT*  Clinical Data: Fever.  PORTABLE CHEST - 1 VIEW  Comparison: Chest radiograph performed 03/30/2012  Findings: The lungs are mildly hypoexpanded.  Vascular congestion is noted, with mild increased interstitial markings, raise concern for mild pulmonary edema.  This is grossly stable from the prior study.  There is no evidence of pleural effusion or pneumothorax.  The cardiomediastinal silhouette is borderline normal in size.  No acute osseous abnormalities are seen.  IMPRESSION: Lungs mildly hypoexpanded.  Vascular congestion, with mildly increased interstitial markings,  raising concern for mild pulmonary edema.  This appears relatively stable from the recent prior study.   Original Report Authenticated By: Tonia Ghent, M.D.    Dg Chest Port 1 View  03/30/2012  *RADIOLOGY REPORT*  Clinical Data: Preoperative chest radiograph.  PORTABLE CHEST - 1 VIEW  Comparison: Chest radiograph performed 02/26/2012  Findings: The lungs are well-aerated.  Vascular congestion is noted.  Bilateral central and bibasilar airspace opacities may reflect mild pulmonary edema.  There is no pleural effusion or pneumothorax.  The cardiomediastinal silhouette is borderline normal in size.  No acute osseous abnormalities are seen.  IMPRESSION: Vascular congestion noted.  Bilateral central and bibasilar airspace opacities may reflect mild pulmonary edema.  This may be transient, due to the patient's recent fall.   Original Report Authenticated By: Tonia Ghent, M.D.        Subjective: Perseverating about food being contaminated.  Tries one bite and then stops eating.    Objective: Filed Vitals:   04/07/12 2200 04/08/12 0600 04/08/12 0757 04/08/12 1016  BP: 154/92 179/103 167/88 175/87  Pulse: 69 75 73 71  Temp: 97.7 F (36.5 C) 97.5 F (36.4 C)    TempSrc: Oral Oral    Resp: 20 20    Height:      Weight:      SpO2: 91% 95%  Intake/Output Summary (Last 24 hours) at 04/08/12 1223 Last data filed at 04/07/12 1430  Gross per 24 hour  Intake      0 ml  Output    400 ml  Net   -400 ml   Weight change:  Exam:   General:  Pt is alert, no acute distress, sitting up in bed today.   HEENT: No icterus, No thrush, [place]/AT  Cardiovascular: RRR, S1/S2, no rubs, no gallops  Respiratory: CTAB, no increased WOB  Abdomen: Soft/+BS, non tender, non distended, no guarding  Extremities: 1+ LLE edema.  Staples c/d/i along left hip.  No erythema or induration.    Data Reviewed: Basic Metabolic Panel:  Recent Labs Lab 04/02/12 0530 04/03/12 0506 04/04/12 0445  NA 134* 132*  135  K 5.4* 4.5 4.2  CL 102 97 100  CO2 21 23 25   GLUCOSE 111* 132* 128*  BUN 26* 24* 21  CREATININE 1.99* 1.64* 1.38*  CALCIUM 7.8* 8.4 8.3*  MG  --  2.2  --   PHOS  --  3.3  --    Liver Function Tests: No results found for this basename: AST, ALT, ALKPHOS, BILITOT, PROT, ALBUMIN,  in the last 168 hours No results found for this basename: LIPASE, AMYLASE,  in the last 168 hours No results found for this basename: AMMONIA,  in the last 168 hours CBC:  Recent Labs Lab 04/01/12 2315 04/02/12 0530 04/03/12 0506 04/04/12 0445  WBC 11.0* 11.8* 12.8* 12.7*  NEUTROABS 6.8  --   --   --   HGB 9.1* 9.3* 10.3* 10.2*  HCT 28.3* 28.6* 32.0* 31.4*  MCV 88.7 87.7 88.6 87.7  PLT 347 331 435* 490*   Cardiac Enzymes: No results found for this basename: CKTOTAL, CKMB, CKMBINDEX, TROPONINI,  in the last 168 hours BNP: No components found with this basename: POCBNP,  CBG:  Recent Labs Lab 04/07/12 1817 04/07/12 1901 04/07/12 2151 04/08/12 0740 04/08/12 1116  GLUCAP 60* 68* 91 96 108*    Recent Results (from the past 240 hour(s))  URINE CULTURE     Status: None   Collection Time    04/01/12 11:09 PM      Result Value Range Status   Specimen Description URINE, CATHETERIZED   Final   Special Requests NONE   Final   Culture  Setup Time 04/02/2012 03:05   Final   Colony Count NO GROWTH   Final   Culture NO GROWTH   Final   Report Status 04/03/2012 FINAL   Final  CULTURE, BLOOD (ROUTINE X 2)     Status: None   Collection Time    04/01/12 11:15 PM      Result Value Range Status   Specimen Description BLOOD RIGHT ANTECUBITAL   Final   Special Requests BOTTLES DRAWN AEROBIC AND ANAEROBIC 4CC   Final   Culture  Setup Time 04/02/2012 03:06   Final   Culture NO GROWTH 5 DAYS   Final   Report Status 04/08/2012 FINAL   Final  CULTURE, BLOOD (ROUTINE X 2)     Status: None   Collection Time    04/01/12 11:24 PM      Result Value Range Status   Specimen Description BLOOD RIGHT HAND    Final   Special Requests BOTTLES DRAWN AEROBIC AND ANAEROBIC 3CC   Final   Culture  Setup Time 04/02/2012 03:06   Final   Culture NO GROWTH 5 DAYS   Final   Report Status 04/08/2012 FINAL  Final     Scheduled Meds: . amLODipine  10 mg Oral Daily  . ARIPiprazole  5 mg Oral Daily  . aspirin EC  325 mg Oral BID  . atenolol  100 mg Oral Daily  . calcium carbonate  1 tablet Oral Q breakfast  . clonazePAM  0.25 mg Oral BID  . cloNIDine  0.1 mg Transdermal Weekly  . docusate sodium  100 mg Oral BID  . feeding supplement  1 Container Oral TID BM  . ferrous sulfate  325 mg Oral BID WC  . haloperidol  10 mg Oral BID  . haloperidol  5 mg Oral Once  . insulin aspart  0-9 Units Subcutaneous TID WC  . levofloxacin  750 mg Oral Daily  . multivitamin with minerals  1 tablet Oral Daily  . OXcarbazepine  450 mg Oral BID  . pantoprazole  40 mg Oral Daily  . trihexyphenidyl  5 mg Oral BID WC  . vitamin B-12  1,000 mcg Oral Daily   Continuous Infusions:     Renae Fickle, MD  [facility] Pager [phone]  If 7PM-7AM, please contact night-coverage [email or web address] Password [facility] 04/08/2012, 12:23 PM   LOS: 9 days

## 2012-04-08 NOTE — Progress Notes (Signed)
Clinical Social Work Progress Note PSYCHIATRY SERVICE LINE 04/08/2012  Patient:  David Lang  Account:  [id number]  Admit Date:  03/30/2012  Clinical Social Worker:  Unk Lightning, LCSW  Date/Time:  04/08/2012 10:30 AM  Review of Patient  Overall Medical Condition:   Patient continues to wait for SNF placement but is medically stable for dc.   Participation Level:  Minimal  Participation Quality  Other - See comment   Other Participation Quality:   Patient fixated on leg immobilizer and struggles with participating in session.   Affect  Anxious   Cognitive  Alert   Reaction to Medications/Concerns:   None reported   Modes of Intervention  Support   Summary of Progress/Plan at Discharge   CSW met with patient at bedside. Patient laying in bed and talking loudly about his leg. Patient does not like immobilizer and wants to take it off so he can get out of bed.    CSW redirected patient to discuss his relationship with his sisters. Patient reports that he has not spoken to his sisters in a long time. Patient reports the only relationship he has is ones that can be had over the phone. Patient reports that he cannot express if he is on good or bad terms with his sisters. Patient feels that it is not important to discuss his sisters.    CSW will staff case with psych MD and will continue to follow.
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
## 2012-04-08 NOTE — Progress Notes (Signed)
Nutrition Brief Note  Per conversation with MD, pt with paranoia about contaminated foods and refusing to eat. Ordered pt meals/snacks throughout the weekend that are packaged and brought up boxed meals, soups, unsweetened/low carbohydrate snacks, that were all placed in pt's room above the sink for RN to give to pt. Discussed with RN. Will continue to monitor.    Levon Hedger MS, RD, LDN [phone] Pager [phone] After Hours Pager

## 2012-04-08 NOTE — Progress Notes (Signed)
PATIENT ID: David Lang   8 Days Post-Op Procedure(s) (LRB): ARTHROPLASTY BIPOLAR HIP (Left)  Subjective: Patient not engaging in conversation today. Per nursing has been refusing IV, PT and PO meds. Saw nurse administering po meds in room this am and he was tolerating it fairly well.  Objective:  Filed Vitals:   04/08/12 1016  BP: 175/87  Pulse: 71  Temp:   Resp:      L hip incision benign, no swelling, erythema, warmth NVID L knee immobilizer in place  Labs:  No results found for this basename: HGB,  in the last 72 hoursNo results found for this basename: WBC, RBC, HCT, PLT,  in the last 72 hoursNo results found for this basename: NA, K, CL, CO2, BUN, CREATININE, GLUCOSE, CALCIUM,  in the last 72 hours  Assessment and Plan: 8 days s/p left hip hemi WBAT, post hip precautions, continue knee immobilizer when not with PT Continue PT  VTE proph:  ASA 325mg  BID, SCDs

## 2012-04-08 NOTE — Progress Notes (Signed)
PT Cancellation Note  ___Treatment cancelled today due to medical issues with patient which prohibited therapy  ___ Treatment cancelled today due to patient receiving procedure or test   ___ Treatment cancelled today due to patient's refusal to participate   _X_ Treatment cancelled today due to pt's uncooperativeness and cognitive deficits.   Felecia Shelling  PTA [facility]  Acute  Rehab Pager      [phone]

## 2012-04-09 LAB — GLUCOSE, CAPILLARY: Glucose-Capillary: 84 mg/dL (ref 70–99)

## 2012-04-09 NOTE — Progress Notes (Signed)
9 Days Post-Op L Hip hemi arthroplasty  Subjective: Patient complains of moderate px in L hip with movement, no additional information can be obtained from the pt this AM he [place] a [facility] [place]. Per nursing still frequently refusing, PT and PO meds. Pt will not keep on any bandage and continues to remove his L knee immobilizer continually.   Objective:  BP 145/73  Pulse 71  Temp(Src) 97.5 F (36.4 C) (Oral)  Resp 18  Ht 5' 9.5" (1.765 m)  Wt 80.74 kg (178 lb)  BMI 25.92 kg/m2  SpO2 94%  L hip incision benign, no swelling, erythema, warmth, staples remain in place, no bandage applied NVID  L knee immobilizer has been removed  Labs: Pt has been refusing blood draws, no labs to review  Assessment and Plan:   -9 days s/p left hip hemi, incision appears benign despite non-compliance with bandage and care complicated    by pts psych history  -WBAT, post hip precautions, continue knee immobilizer when not with PT    -Please watch pt and ensure knee immobilizer maintained at all times when not with PT, can tape  -Continue PT VTE proph: ASA 325mg  BID, SCDs  -Ortho D/C pending PT compliance

## 2012-04-09 NOTE — Progress Notes (Signed)
Patient up to chair with 2 assist and walker.  Patient moves well after getting up to standing position.  Patient talking about security organizations and wanting to wear black pants, black shirts, and black hat.
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
## 2012-04-09 NOTE — Consult Note (Signed)
Reason for Consult: capacity Referring Physician: unknown  David Lang is an 62 y.o. male.  HPI:   History of Present Illness:62 year old male with a history of paranoid schizophrenia who was initially admitted to [facility]) on 03/05/2012 until 03/30/2012. The patient suffered a mechanical fall resulting in a displaced left subcapital femoral neck fracture.  He is  refusing all medication, refuse to participate in Pt rehabilitation and refuse to wear the leg brace for transfer from bed-chair.  Talked with is his care take today. Per her pt was not taking his meds at home either at times. Agitated at times now. Poor historian.  Past Medical History  Diagnosis Date  . Hypertension   . Diabetes mellitus without complication   . Mental disorder   . Schizophrenia     Past Surgical History  Procedure Laterality Date  . Hip arthroplasty Left 03/31/2012    Procedure: ARTHROPLASTY BIPOLAR HIP;  Surgeon: Mable Paris, MD;  Location: [facility]S;  Service: Orthopedics;  Laterality: Left;    History reviewed. No pertinent family history.  Social History:  reports that he has never smoked. He has never used smokeless tobacco. He reports that he does not drink alcohol or use illicit drugs.  Allergies: No Known Allergies  Medications: I have reviewed the patient's current medications.  Results for orders placed during the hospital encounter of 03/30/12 (from the past 48 hour(s))  GLUCOSE, CAPILLARY     Status: None   Collection Time    04/07/12  9:51 PM      Result Value Range   Glucose-Capillary 91  70 - 99 mg/dL  GLUCOSE, CAPILLARY     Status: None   Collection Time    04/08/12  7:40 AM      Result Value Range   Glucose-Capillary 96  70 - 99 mg/dL   Comment 1 Notify RN    GLUCOSE, CAPILLARY     Status: Abnormal   Collection Time    04/08/12 11:16 AM      Result Value Range   Glucose-Capillary 108 (*) 70 - 99 mg/dL   Comment 1 Notify RN    GLUCOSE,  CAPILLARY     Status: Abnormal   Collection Time    04/08/12  4:29 PM      Result Value Range   Glucose-Capillary 101 (*) 70 - 99 mg/dL   Comment 1 Notify RN    GLUCOSE, CAPILLARY     Status: Abnormal   Collection Time    04/08/12 10:08 PM      Result Value Range   Glucose-Capillary 109 (*) 70 - 99 mg/dL   Comment 1 Notify RN    GLUCOSE, CAPILLARY     Status: None   Collection Time    04/09/12  1:54 PM      Result Value Range   Glucose-Capillary 84  70 - 99 mg/dL    No results found.  ROS Blood pressure 176/97, pulse 77, temperature 97.5 F (36.4 C), temperature source Axillary, resp. rate 18, height 5' 9.5" (1.765 m), weight 80.74 kg (178 lb), SpO2 97.00%. Physical Exam   Mental Status Examination/Evaluation: Objective:  Appearance: Casual, Disheveled and ungroomed  Eye Contact::  Good  Speech:  Clear and Coherent, Slow and very gutteral  Volume:  can hear down the hallway  Mood:  parnanoid   Affect:  Blunt and Congruent  Thought Process:  Negative, Disorganized and Irrelevant  Orientation:  Other:  Pt is oriented to self and sisters  Thought Content:  Paranoid Ideation and refuses to accept treatment and safety precautions  Suicidal Thoughts:  No  Homicidal Thoughts:  No  Judgement:  Impaired  Insight:  Lacking   DIAGNOSIS:   AXIS I   Schizophrenia, paranoid type  AXIS II  Deferred  AXIS III See medical notes.  AXIS IV educational problems, other psychosocial or environmental problems, problems related to legal system/crime and Pt's paranoia continues to confront every effort to provide restorative and maintain functional mobility  AXIS V 41-50 serious symptoms    RECOMMENDATION:   1.  Pt does not have capacity to comprehend the benefits of intended treament.  2.  Will continue to encourage to take his meds. Pt was receptive to halodol in the past. i will recommend to tell him the name of halodol before giving him.   3.  Will follow      Wonda Cerise 04/09/2012, 9:19 PM

## 2012-04-10 NOTE — Plan of Care (Signed)
Problem: Phase II Progression Outcomes Goal: IV changed to normal saline lock Outcome: Not Applicable Date Met:  04/10/12 No IV access Goal: Obtain order to discontinue catheter if appropriate Outcome: Not Applicable Date Met:  04/10/12 No catheter

## 2012-04-10 NOTE — Progress Notes (Signed)
Patient had an uneventful night. He refused both doses of Heparin 5000mg .

## 2012-04-10 NOTE — Progress Notes (Signed)
Patient looks comfortable  BP 161/86  Pulse 70  Temp(Src) 97.8 F (36.6 C) (Axillary)  Resp 18  Ht 5' 9.5" (1.765 m)  Wt 80.74 kg (178 lb)  BMI 25.92 kg/m2  SpO2 96%   L hip incision benign, no swelling, erythema, warmth, staples remain in place, no bandage applied  NVID  L knee immobilizer has been removed   Assessment and Plan:  -10 days s/p left hip hemi, incision appears benign despite non-compliance with bandage and care complicated by pts psych history  -WBAT, post hip precautions, continue knee immobilizer when not with PT  -Please watch pt and ensure knee immobilizer maintained at all times when not with PT, can tape  -Continue PT VTE proph: ASA 325mg  BID, SCDs  -OK to d/c from ortho standpoint

## 2012-04-10 NOTE — Progress Notes (Signed)
Occupational Therapy Treatment Patient Details Name: David Lang MRN: [id number] DOB: January 30, 1950 Today's Date: 04/10/2012 Time: 4098-1191 OT Time Calculation (min): 20 min  OT Assessment / Plan / Recommendation Comments on Treatment Session Pt still with limited participation in therapy.  Was able to transfer to EOB and walk a small distance of 10 ft to bedside chair, but needed max coaxing and physical assist to participate.  Pt with decreased awareness, unable to recall hip precautions and currently not wearing KI.    Follow Up Recommendations  SNF       Equipment Recommendations  3 in 1 bedside comode       Frequency Min 2X/week   Plan Discharge plan remains appropriate    Precautions / Restrictions Precautions Precautions: Posterior Hip;Fall Precaution Comments: Pt unaware of his THP Restrictions Weight Bearing Restrictions: No LLE Weight Bearing: Weight bearing as tolerated   Pertinent Vitals/Pain Pt with no facial expression of pain and did not state pain    ADL  Eating/Feeding: Performed;Set up;Other (comment) (to drink from a cup) Toilet Transfer: Simulated;Moderate assistance Toilet Transfer Method: Other (comment) (ambulate with RW) Toilet Transfer Equipment: Other (comment) (To bedside chair, pt declined need to toilet.) Toileting - Clothing Manipulation and Hygiene: Simulated;Moderate assistance Where Assessed - Toileting Clothing Manipulation and Hygiene: Other (comment) (sit to stand from the EOB) Equipment Used: Rolling walker Transfers/Ambulation Related to ADLs: Pt is overall mod assist for mobility with use of RW.  Needs max demonstrational cueing to initiate sit to stand and for mobility however. ADL Comments: Pt needed max encouragement with max demonstrational cueing to participate in therapy.  Pt overall required total assist +2 (pt 50% for all bed mobility,  sit to stand, and transfers.  Still with decreased awareness of situation or his hip precautions.        OT Goals ADL Goals ADL Goal: Toilet Transfer - Progress: Progressing toward goals ADL Goal: Toileting - Clothing Manipulation - Progress: Progressing toward goals Miscellaneous OT Goals OT Goal: Miscellaneous Goal #1 - Progress: Progressing toward goals OT Goal: Miscellaneous Goal #2 - Progress: Progressing toward goals  Visit Information  Last OT Received On: 04/10/12 Assistance Needed: +2    Subjective Data  Subjective: I want to talk to the president of the hospital.      Cognition  Cognition Overall Cognitive Status: Impaired Area of Impairment: Memory;Safety/judgement;Following commands;Awareness of errors;Problem solving Arousal/Alertness: Awake/alert Orientation Level: Disoriented to;Place;Time;Situation Behavior During Session: Lethargic Memory: Decreased recall of precautions Safety/Judgement: Decreased awareness of safety precautions    Mobility  Bed Mobility Bed Mobility: Supine to Sit Supine to Sit: 1: +2 Total assist Supine to Sit: Patient Percentage: 50% Transfers Transfers: Sit to Stand Sit to Stand: 1: +2 Total assist;With upper extremity assist;From bed Sit to Stand: Patient Percentage: 50% Stand to Sit: 1: +2 Total assist;With upper extremity assist;To bed Stand to Sit: Patient Percentage: 50% Details for Transfer Assistance: Pt needs max instructional cueing to initiate and perform all sit to stand or functional transfers.       Balance Balance Balance Assessed: Yes Dynamic Standing Balance Dynamic Standing - Balance Support: Right upper extremity supported;Left upper extremity supported Dynamic Standing - Level of Assistance: 3: Mod assist   End of Session OT - End of Session Equipment Utilized During Treatment: Gait belt Activity Tolerance: Treatment limited secondary to agitation Patient left: in chair;with call bell/phone within reach;with nursing in room Nurse Communication: Mobility status     David Lang OTR/L Pager number  [phone] 04/10/2012, 2:16 PM

## 2012-04-10 NOTE — Progress Notes (Signed)
Was able to apply knee immobilizer when patient returned to bed.  After wearing for approximately an hour patient began yelling for it to come off, when I told him he needed to wear this for his leg he stated it was strangulating him.  I assured him that this is not possible as it will not come any higher than the mid thigh however patient continued to yell and insist it be removed.

## 2012-04-10 NOTE — Progress Notes (Signed)
Physical Therapy Treatment Patient Details Name: David Lang MRN: [id number] DOB: 06/21/1950 Today's Date: 04/10/2012 Time: 4098-1191 PT Time Calculation (min): 20 min  PT Assessment / Plan / Recommendation Comments on Treatment Session  Pt continues to have limited participation in therapy and requires physical assist to initiate all mobility.      Follow Up Recommendations  SNF     Does the patient have the potential to tolerate intense rehabilitation     Barriers to Discharge        Equipment Recommendations  Rolling walker with 5" wheels    Recommendations for Other Services    Frequency Min 5X/week   Plan Discharge plan remains appropriate    Precautions / Restrictions Precautions Precautions: Posterior Hip;Fall Precaution Booklet Issued: Yes (comment) Precaution Comments: Sign in room, pt unaware of THP Restrictions Weight Bearing Restrictions: No LLE Weight Bearing: Weight bearing as tolerated   Pertinent Vitals/Pain Pt unable to state number of pain    Mobility  Bed Mobility Bed Mobility: Supine to Sit Supine to Sit: 1: +2 Total assist Supine to Sit: Patient Percentage: 50% Details for Bed Mobility Assistance: Pt requires increased time and MAX encouragement to participate with therapy.  PT/OT provided physical assist to initiate mobility to EOB.  Requires assist for BLEs and trunk to get into sitting.  Transfers Transfers: Sit to Stand;Stand to Sit Sit to Stand: 1: +2 Total assist;With upper extremity assist;From bed Sit to Stand: Patient Percentage: 50% Stand to Sit: 1: +2 Total assist;With upper extremity assist;To bed Stand to Sit: Patient Percentage: 50% Details for Transfer Assistance: Pt requires physical assist to initiate standing with MAX cues for participation and technique.  Ambulation/Gait Ambulation/Gait Assistance: 3: Mod assist Ambulation Distance (Feet): 10 Feet Assistive device: Rolling walker Ambulation/Gait Assistance Details: MAX verbal  and manual cues for continued ambulation and to negotiate RW properly.  Gait Pattern: Step-to pattern;Antalgic;Trunk flexed;Decreased step length - right;Decreased step length - left;Decreased stride length;Decreased stance time - left Gait velocity: decreased    Exercises     PT Diagnosis:    PT Problem List:   PT Treatment Interventions:     PT Goals Acute Rehab PT Goals PT Goal Formulation: Patient unable to participate in goal setting Time For Goal Achievement: 04/08/12 Potential to Achieve Goals: Good Pt will go Supine/Side to Sit: with supervision PT Goal: Supine/Side to Sit - Progress: Progressing toward goal Pt will go Sit to Stand: with supervision PT Goal: Sit to Stand - Progress: Progressing toward goal Pt will go Stand to Sit: with supervision PT Goal: Stand to Sit - Progress: Progressing toward goal Pt will Ambulate: 51 - 150 feet;with min assist;with rolling walker PT Goal: Ambulate - Progress: Progressing toward goal  Visit Information  Last PT Received On: 04/10/12 Assistance Needed: +2    Subjective Data  Subjective: Please, I beg of you.  Patient Stated Goal: none stated   Cognition  Cognition Overall Cognitive Status: Impaired Area of Impairment: Memory;Safety/judgement;Following commands;Awareness of errors;Problem solving Arousal/Alertness: Awake/alert Orientation Level: Disoriented to;Place;Time;Situation Behavior During Session: Lethargic Memory: Decreased recall of precautions Following Commands: Follows one step commands inconsistently Safety/Judgement: Decreased awareness of safety precautions Awareness of Errors: Assistance required to identify errors made;Assistance required to correct errors made    Balance  Balance Balance Assessed: Yes Dynamic Standing Balance Dynamic Standing - Balance Support: Right upper extremity supported;Left upper extremity supported Dynamic Standing - Level of Assistance: 3: Mod assist  End of Session PT - End  of Session Equipment Utilized  During Treatment: Gait belt Activity Tolerance: Patient limited by fatigue;Patient limited by pain Patient left: in chair;with call bell/phone within reach;with chair alarm set Nurse Communication: Mobility status   GP     Vista Deck 04/10/2012, 2:44 PM

## 2012-04-10 NOTE — Progress Notes (Signed)
[facility] PROGRESS NOTE  David Lang AVW:[id number] DOB: 15-Dec-1950 DOA: 03/30/2012 PCP: Default, Provider, MD   Assessment/Plan:  Paranoid schizophrenia with confusion, paranoia and disorganized thinking.  Patient taking oral medications when encouraged but still refusing to eat more than a few bites.   -  IVC paperwork completed 3/12.   -  Cont abilify 5mg  daily -  Haldol 10mg  po BID -  Triletpal 450mg  BID -  Artane 5mg  BID -  Clonazepam BID -  Haldol IM prn agitation  Refusal to eat:  Paranoid about his food being contaminated.  Ate some pita bread and cheese yesterday -  Family to bring foods from home if possible.    Hypoglycemia, likely from not eating in many days, BG low normal.  Resolved -  Pt eating small amounts and fingerstick is stable -  Cont glucagon prn  HCAP/Hypoxemia, resolved.  Completed levofloxacin 3/14.  Left subcapital femoral neck fracture s/p Left hip hemiarthroplasty, 03/31/2012.  Patient states that we are planning to shock him through his socks and the leg brace with high voltage electricity, so he refuses to wear the brace.  Also, he has not been getting out of bed.   -  WBAT LLE -  Nursing to get patient OOB qshift at minimum - need to find some pants. -  Please KEEP brace on.  TAPE brace on if necessary.    CKD stage 3 -  Minimize nephrotoxins -  Renally dose medications if needed.  Hypertension BP still elevated but overall trending down on clonidine patch. -  Continue atenolol and norvasc -  Continue clonidine patch -  Will defer ACE/ARB as patient has not allowed lab draws and may not be able to monitor potassium and kidney function after initiating.  Diabetes mellitus type 2  - Hemoglobin A1c 8.8 on 03/22/2012  -Continue NovoLog sliding scale  -Patient was not on any medications during his February admission   Anemia due to b12 and iron deficiency -Repeat serum B12 as this was marginally low on 03/22/2012--> repeat B12 on  04/01/2012--276  -Continue oral B12  -continue ferrous sulfate - Needs outpatient GI follow up -Check RBC folate-655  DIET:  Regular ACCESS:  None IVF:  OFF PROPH:  SCDs and heparin  CODE: Full code Family Communication: Pt alone.  Updated sister again today.   Disposition Plan:  Sisters obtaining guardianship.  Spoke with sister, [name], [phone], who states it is okay to perform fingersticks and do tasks to protect his life against his will.  If procedures involve needles, she recommends against explaining the procedure to him prior to performing it.  Also spoke with representatives from Ethics committee who agreed with performing medically necessary procedures against patient's will if family gave permission.    Antibiotics:  Vancomycin/Zosyn 04/02/2012>>>04/04/12 Levofloxacin 04/05/12>>> 3/16   Procedures/Studies: Dg Hip Complete Left  03/30/2012  s*RADIOLOGY REPORT*  Clinical Data: Status post fall; left hip pain.  LEFT HIP - COMPLETE 2+ VIEW  Comparison: None.  Findings: There is a mildly displaced subcapital fracture involving the left femoral neck, with mild rotation of the femoral head.  The left femoral head remains seated at the acetabulum.  The right hip joint is unremarkable in appearance.  The sacroiliac joints are unremarkable in appearance.  The visualized bowel gas pattern is grossly unremarkable.  IMPRESSION: Mildly displaced subcapital fracture involving the left femoral neck.   Original Report Authenticated By: Tonia Ghent, M.D.    Ct Head Wo Contrast  03/30/2012  *RADIOLOGY  REPORT*  Clinical Data:  Status post fall; laceration above the left eye. Concern for head or cervical spine injury.  CT HEAD WITHOUT CONTRAST AND CT CERVICAL SPINE WITHOUT CONTRAST  Technique:  Multidetector CT imaging of the head and cervical spine was performed following the standard protocol without intravenous contrast.  Multiplanar CT image reconstructions of the cervical spine were  also generated.  Comparison: None  CT HEAD  Findings: There is no evidence of acute infarction, mass lesion, or intra- or extra-axial hemorrhage on CT.  Prominence of the sulci suggests mild cortical volume loss. Relatively diffuse periventricular and subcortical white matter change likely reflects small vessel ischemic microangiopathy. Cerebellar atrophy is noted.  A small chronic lacunar infarct is noted at the anterior right thalamus.  Chronic ischemic change is seen at the external capsule bilaterally.  The brainstem and fourth ventricle are within normal limits.  The cerebral hemispheres demonstrate grossly normal gray-white differentiation.  No mass effect or midline shift is seen.   There is no evidence of fracture; visualized osseous structures are unremarkable in appearance.  The orbits are within normal limits.  The paranasal sinuses and mastoid air cells are well- aerated.  The known soft tissue laceration is not well characterized on CT.  IMPRESSION:  1.  No evidence of traumatic intracranial injury or fracture. 2.  Mild cortical volume loss and diffuse small vessel ischemic microangiopathy. 3.  Small chronic infarct at the anterior right thalamus, and chronic ischemic change at the external capsule bilaterally.  CT CERVICAL SPINE  Findings: There is no evidence of fracture or subluxation. Vertebral bodies demonstrate normal height and alignment. Intervertebral disc spaces are preserved.  Small anterior disc osteophyte complexes are noted along the cervical spine. Prevertebral soft tissues are within normal limits.  The visualized neural foramina are grossly unremarkable.  The thyroid gland is unremarkable in appearance.  Diffuse interstitial prominence is noted at the lung apices, with small bilateral pleural effusions, concerning for pulmonary edema.  Mild calcification is noted along the left vertebral artery at the level of C2.  IMPRESSION:  1.  No evidence of fracture or subluxation along the  cervical spine. 2.  Diffuse interstitial prominence of the lung apices, with small bilateral pleural effusions, concerning for pulmonary edema.   Original Report Authenticated By: Tonia Ghent, M.D.    Ct Cervical Spine Wo Contrast  03/30/2012  *RADIOLOGY REPORT*  Clinical Data:  Status post fall; laceration above the left eye. Concern for head or cervical spine injury.  CT HEAD WITHOUT CONTRAST AND CT CERVICAL SPINE WITHOUT CONTRAST  Technique:  Multidetector CT imaging of the head and cervical spine was performed following the standard protocol without intravenous contrast.  Multiplanar CT image reconstructions of the cervical spine were also generated.  Comparison: None  CT HEAD  Findings: There is no evidence of acute infarction, mass lesion, or intra- or extra-axial hemorrhage on CT.  Prominence of the sulci suggests mild cortical volume loss. Relatively diffuse periventricular and subcortical white matter change likely reflects small vessel ischemic microangiopathy. Cerebellar atrophy is noted.  A small chronic lacunar infarct is noted at the anterior right thalamus.  Chronic ischemic change is seen at the external capsule bilaterally.  The brainstem and fourth ventricle are within normal limits.  The cerebral hemispheres demonstrate grossly normal gray-white differentiation.  No mass effect or midline shift is seen.   There is no evidence of fracture; visualized osseous structures are unremarkable in appearance.  The orbits are within normal limits.  The  paranasal sinuses and mastoid air cells are well- aerated.  The known soft tissue laceration is not well characterized on CT.  IMPRESSION:  1.  No evidence of traumatic intracranial injury or fracture. 2.  Mild cortical volume loss and diffuse small vessel ischemic microangiopathy. 3.  Small chronic infarct at the anterior right thalamus, and chronic ischemic change at the external capsule bilaterally.  CT CERVICAL SPINE  Findings: There is no evidence of  fracture or subluxation. Vertebral bodies demonstrate normal height and alignment. Intervertebral disc spaces are preserved.  Small anterior disc osteophyte complexes are noted along the cervical spine. Prevertebral soft tissues are within normal limits.  The visualized neural foramina are grossly unremarkable.  The thyroid gland is unremarkable in appearance.  Diffuse interstitial prominence is noted at the lung apices, with small bilateral pleural effusions, concerning for pulmonary edema.  Mild calcification is noted along the left vertebral artery at the level of C2.  IMPRESSION:  1.  No evidence of fracture or subluxation along the cervical spine. 2.  Diffuse interstitial prominence of the lung apices, with small bilateral pleural effusions, concerning for pulmonary edema.   Original Report Authenticated By: Tonia Ghent, M.D.    Dg Pelvis Portable  03/31/2012  *RADIOLOGY REPORT*  Clinical Data: Postop  PORTABLE PELVIS  Comparison: 03/30/2012.  Findings: Portable film demonstrates satisfactory appearance status post left hemiarthroplasty.  IMPRESSION: Satisfactory position and alignment.   Original Report Authenticated By: Davonna Belling, M.D.    Dg Chest Port 1 View  04/01/2012  *RADIOLOGY REPORT*  Clinical Data: Fever.  PORTABLE CHEST - 1 VIEW  Comparison: Chest radiograph performed 03/30/2012  Findings: The lungs are mildly hypoexpanded.  Vascular congestion is noted, with mild increased interstitial markings, raise concern for mild pulmonary edema.  This is grossly stable from the prior study.  There is no evidence of pleural effusion or pneumothorax.  The cardiomediastinal silhouette is borderline normal in size.  No acute osseous abnormalities are seen.  IMPRESSION: Lungs mildly hypoexpanded.  Vascular congestion, with mildly increased interstitial markings, raising concern for mild pulmonary edema.  This appears relatively stable from the recent prior study.   Original Report Authenticated By: Tonia Ghent, M.D.    Dg Chest Port 1 View  03/30/2012  *RADIOLOGY REPORT*  Clinical Data: Preoperative chest radiograph.  PORTABLE CHEST - 1 VIEW  Comparison: Chest radiograph performed 02/26/2012  Findings: The lungs are well-aerated.  Vascular congestion is noted.  Bilateral central and bibasilar airspace opacities may reflect mild pulmonary edema.  There is no pleural effusion or pneumothorax.  The cardiomediastinal silhouette is borderline normal in size.  No acute osseous abnormalities are seen.  IMPRESSION: Vascular congestion noted.  Bilateral central and bibasilar airspace opacities may reflect mild pulmonary edema.  This may be transient, due to the patient's recent fall.   Original Report Authenticated By: Tonia Ghent, M.D.        Subjective: Patient continues to refuse the leg brace because he says it is going to shock him with high voltage or strangle him.  He has not eaten more than a few bites today, however, he was out of bed some yesterday and is sitting in chair today.    Objective: Filed Vitals:   04/09/12 1402 04/09/12 2105 04/10/12 0500 04/10/12 1512  BP: 176/97 162/73 161/86 109/68  Pulse: 77 67 70 69  Temp: 97.5 F (36.4 C) 97.8 F (36.6 C) 97.8 F (36.6 C) 97.7 F (36.5 C)  TempSrc: Axillary Axillary Axillary Axillary  Resp:  18 18 18 18   Height:      Weight:      SpO2: 97% 96% 96% 98%   No intake or output data in the 24 hours ending 04/10/12 1548 Weight change:  Exam:   General:  Pt is alert, no acute distress, sitting in chair  HEENT: No icterus, MMM, [place]/AT  Cardiovascular: RRR, S1/S2, no rubs, no gallops  Respiratory: CTAB, no increased WOB  Abdomen: Soft/+BS, non tender, non distended, no guarding  Extremities: 2+ LLE edema.  Staples c/d/i along left hip.  No erythema or induration.  Moving left leg spontaneously.    Data Reviewed: Basic Metabolic Panel:  Recent Labs Lab 04/04/12 0445  NA 135  K 4.2  CL 100  CO2 25  GLUCOSE 128*  BUN 21   CREATININE 1.38*  CALCIUM 8.3*   Liver Function Tests: No results found for this basename: AST, ALT, ALKPHOS, BILITOT, PROT, ALBUMIN,  in the last 168 hours No results found for this basename: LIPASE, AMYLASE,  in the last 168 hours No results found for this basename: AMMONIA,  in the last 168 hours CBC:  Recent Labs Lab 04/04/12 0445  WBC 12.7*  HGB 10.2*  HCT 31.4*  MCV 87.7  PLT 490*   Cardiac Enzymes: No results found for this basename: CKTOTAL, CKMB, CKMBINDEX, TROPONINI,  in the last 168 hours BNP: No components found with this basename: POCBNP,  CBG:  Recent Labs Lab 04/08/12 2208 04/09/12 1354 04/09/12 2132 04/10/12 0725 04/10/12 1504  GLUCAP 109* 84 156* 95 174*    Recent Results (from the past 240 hour(s))  URINE CULTURE     Status: None   Collection Time    04/01/12 11:09 PM      Result Value Range Status   Specimen Description URINE, CATHETERIZED   Final   Special Requests NONE   Final   Culture  Setup Time 04/02/2012 03:05   Final   Colony Count NO GROWTH   Final   Culture NO GROWTH   Final   Report Status 04/03/2012 FINAL   Final  CULTURE, BLOOD (ROUTINE X 2)     Status: None   Collection Time    04/01/12 11:15 PM      Result Value Range Status   Specimen Description BLOOD RIGHT ANTECUBITAL   Final   Special Requests BOTTLES DRAWN AEROBIC AND ANAEROBIC 4CC   Final   Culture  Setup Time 04/02/2012 03:06   Final   Culture NO GROWTH 5 DAYS   Final   Report Status 04/08/2012 FINAL   Final  CULTURE, BLOOD (ROUTINE X 2)     Status: None   Collection Time    04/01/12 11:24 PM      Result Value Range Status   Specimen Description BLOOD RIGHT HAND   Final   Special Requests BOTTLES DRAWN AEROBIC AND ANAEROBIC 3CC   Final   Culture  Setup Time 04/02/2012 03:06   Final   Culture NO GROWTH 5 DAYS   Final   Report Status 04/08/2012 FINAL   Final     Scheduled Meds: . amLODipine  10 mg Oral Daily  . ARIPiprazole  5 mg Oral Daily  . aspirin EC  325  mg Oral BID  . atenolol  100 mg Oral Daily  . calcium carbonate  1 tablet Oral Q breakfast  . clonazePAM  0.25 mg Oral BID  . cloNIDine  0.1 mg Transdermal Weekly  . docusate sodium  100 mg Oral BID  .  feeding supplement  1 Container Oral TID BM  . ferrous sulfate  325 mg Oral BID WC  . haloperidol  10 mg Oral BID  . haloperidol  5 mg Oral Once  . heparin subcutaneous  5,000 Units Subcutaneous Q8H  . insulin aspart  0-9 Units Subcutaneous TID WC  . levofloxacin  750 mg Oral Daily  . multivitamin with minerals  1 tablet Oral Daily  . OXcarbazepine  450 mg Oral BID  . pantoprazole  40 mg Oral Daily  . trihexyphenidyl  5 mg Oral BID WC  . vitamin B-12  1,000 mcg Oral Daily   Continuous Infusions:     Renae Fickle, MD  [facility] Pager [phone]  If 7PM-7AM, please contact night-coverage [email or web address] Password [email or web address] 04/10/2012, 3:48 PM   LOS: 11 days

## 2012-04-11 LAB — GLUCOSE, CAPILLARY
Glucose-Capillary: 184 mg/dL — ABNORMAL HIGH (ref 70–99)
Glucose-Capillary: 130 mg/dL — ABNORMAL HIGH (ref 70–99)

## 2012-04-11 NOTE — Progress Notes (Signed)
Nutrition Brief Note  - Noted pt continues to eat poorly. Family brought in some food yesterday which pt consumed 100% of. Met with pt, who was asleep, however noted ethnic boxed foods and pitas in bags on bedside table. Discussed intake with MD who recommended calorie count - placed order and notified RN. Will continue to monitor and analyze calorie count.   Levon Hedger MS, RD, LDN [phone] Pager [phone] After Hours Pager

## 2012-04-11 NOTE — Progress Notes (Signed)
PT Cancellation Note  ___Treatment cancelled today due to medical issues with patient which prohibited therapy  ___ Treatment cancelled today due to patient receiving procedure or test   _X_ Treatment cancelled today due to patient's refusal to participate ........"I wish to rest"......attempted positive reinforcement several times however became increasingly agitated.  ___ Treatment cancelled today due to  Felecia Shelling  PTA [facility]  Acute  Rehab Pager      [phone]

## 2012-04-11 NOTE — Progress Notes (Signed)
CSW faxed updated info to [facility] at [facility]. Pending acceptance.  See Beharry C. Hayes Czaja MSW, LCSW [phone]

## 2012-04-11 NOTE — Progress Notes (Signed)
[facility] PROGRESS NOTE  David Lang ZOX:[id number] DOB: 1951/01/01 DOA: 03/30/2012 PCP: Default, Provider, MD   Assessment/Plan:  Paranoid schizophrenia with confusion, paranoia and disorganized thinking.  Starting to eat some.     -  IVC paperwork completed 3/12 and 3/17 -  Cont abilify 5mg  daily -  Haldol 10mg  po BID -  Triletpal 450mg  BID -  Artane 5mg  BID -  Clonazepam BID -  Haldol IM prn agitation  Refusal to eat, improving:   Ate a small amount yesterday -  Calorie count -  Weekly weights -  Family to bring foods from home if possible.   -  Appreciate nutrition assistance.  Hypoglycemia, likely from not eating in many days, BG low normal.  Resolved -  Pt eating small amounts and fingerstick is stable -  Cont glucagon prn  HCAP/Hypoxemia, resolved.  Completed levofloxacin 3/14.  Left subcapital femoral neck fracture s/p Left hip hemiarthroplasty, 03/31/2012.  Continues to refuse the brace and shouts that it is strangling him continuously until the brace is taken off.   -  WBAT LLE -  OOB qshift -  Please KEEP brace on while in bed.  TAPE brace on if necessary.    CKD stage 3 -  Minimize nephrotoxins -  Renally dose medications if needed.  Hypertension BP trending down on clonidine patch. -  Continue atenolol and norvasc -  Continue clonidine patch  Diabetes mellitus type 2  - Hemoglobin A1c 8.8 on 03/22/2012  -Continue NovoLog sliding scale   Anemia due to b12 and iron deficiency -Repeat serum B12 as this was marginally low on 03/22/2012--> repeat B12 on 04/01/2012--276  -Continue oral B12  -continue ferrous sulfate - Needs outpatient GI follow up -Check RBC folate-655  DIET:  Regular ACCESS:  None IVF:  OFF PROPH:  SCDs and heparin  CODE: Full code Family Communication: Pt alone.  Attempted to call sister today, but did not pick up phone.   Disposition Plan:   Previously spoke with sister, [name], [phone], who states it is okay to  perform fingersticks and do tasks to protect his life against his will.  If procedures involve needles, she recommends against explaining the procedure to him prior to performing it.  Also spoke with representatives from Ethics committee who agreed with performing medically necessary procedures against patient's will if family gave permission.  Awaiting sister's guardianship and placement in SNF vs. Psychiatric facility if he continues to refuse PT/OT.   Antibiotics:  Vancomycin/Zosyn 04/02/2012>>>04/04/12 Levofloxacin 04/05/12>>> 3/16   Procedures/Studies: Dg Hip Complete Left  03/30/2012  s*RADIOLOGY REPORT*  Clinical Data: Status post fall; left hip pain.  LEFT HIP - COMPLETE 2+ VIEW  Comparison: None.  Findings: There is a mildly displaced subcapital fracture involving the left femoral neck, with mild rotation of the femoral head.  The left femoral head remains seated at the acetabulum.  The right hip joint is unremarkable in appearance.  The sacroiliac joints are unremarkable in appearance.  The visualized bowel gas pattern is grossly unremarkable.  IMPRESSION: Mildly displaced subcapital fracture involving the left femoral neck.   Original Report Authenticated By: Tonia Ghent, M.D.    Ct Head Wo Contrast  03/30/2012  *RADIOLOGY REPORT*  Clinical Data:  Status post fall; laceration above the left eye. Concern for head or cervical spine injury.  CT HEAD WITHOUT CONTRAST AND CT CERVICAL SPINE WITHOUT CONTRAST  Technique:  Multidetector CT imaging of the head and cervical spine was performed following the standard protocol without  intravenous contrast.  Multiplanar CT image reconstructions of the cervical spine were also generated.  Comparison: None  CT HEAD  Findings: There is no evidence of acute infarction, mass lesion, or intra- or extra-axial hemorrhage on CT.  Prominence of the sulci suggests mild cortical volume loss. Relatively diffuse periventricular and subcortical white matter change likely  reflects small vessel ischemic microangiopathy. Cerebellar atrophy is noted.  A small chronic lacunar infarct is noted at the anterior right thalamus.  Chronic ischemic change is seen at the external capsule bilaterally.  The brainstem and fourth ventricle are within normal limits.  The cerebral hemispheres demonstrate grossly normal gray-white differentiation.  No mass effect or midline shift is seen.   There is no evidence of fracture; visualized osseous structures are unremarkable in appearance.  The orbits are within normal limits.  The paranasal sinuses and mastoid air cells are well- aerated.  The known soft tissue laceration is not well characterized on CT.  IMPRESSION:  1.  No evidence of traumatic intracranial injury or fracture. 2.  Mild cortical volume loss and diffuse small vessel ischemic microangiopathy. 3.  Small chronic infarct at the anterior right thalamus, and chronic ischemic change at the external capsule bilaterally.  CT CERVICAL SPINE  Findings: There is no evidence of fracture or subluxation. Vertebral bodies demonstrate normal height and alignment. Intervertebral disc spaces are preserved.  Small anterior disc osteophyte complexes are noted along the cervical spine. Prevertebral soft tissues are within normal limits.  The visualized neural foramina are grossly unremarkable.  The thyroid gland is unremarkable in appearance.  Diffuse interstitial prominence is noted at the lung apices, with small bilateral pleural effusions, concerning for pulmonary edema.  Mild calcification is noted along the left vertebral artery at the level of C2.  IMPRESSION:  1.  No evidence of fracture or subluxation along the cervical spine. 2.  Diffuse interstitial prominence of the lung apices, with small bilateral pleural effusions, concerning for pulmonary edema.   Original Report Authenticated By: Tonia Ghent, M.D.    Ct Cervical Spine Wo Contrast  03/30/2012  *RADIOLOGY REPORT*  Clinical Data:  Status post  fall; laceration above the left eye. Concern for head or cervical spine injury.  CT HEAD WITHOUT CONTRAST AND CT CERVICAL SPINE WITHOUT CONTRAST  Technique:  Multidetector CT imaging of the head and cervical spine was performed following the standard protocol without intravenous contrast.  Multiplanar CT image reconstructions of the cervical spine were also generated.  Comparison: None  CT HEAD  Findings: There is no evidence of acute infarction, mass lesion, or intra- or extra-axial hemorrhage on CT.  Prominence of the sulci suggests mild cortical volume loss. Relatively diffuse periventricular and subcortical white matter change likely reflects small vessel ischemic microangiopathy. Cerebellar atrophy is noted.  A small chronic lacunar infarct is noted at the anterior right thalamus.  Chronic ischemic change is seen at the external capsule bilaterally.  The brainstem and fourth ventricle are within normal limits.  The cerebral hemispheres demonstrate grossly normal gray-white differentiation.  No mass effect or midline shift is seen.   There is no evidence of fracture; visualized osseous structures are unremarkable in appearance.  The orbits are within normal limits.  The paranasal sinuses and mastoid air cells are well- aerated.  The known soft tissue laceration is not well characterized on CT.  IMPRESSION:  1.  No evidence of traumatic intracranial injury or fracture. 2.  Mild cortical volume loss and diffuse small vessel ischemic microangiopathy. 3.  Small chronic  infarct at the anterior right thalamus, and chronic ischemic change at the external capsule bilaterally.  CT CERVICAL SPINE  Findings: There is no evidence of fracture or subluxation. Vertebral bodies demonstrate normal height and alignment. Intervertebral disc spaces are preserved.  Small anterior disc osteophyte complexes are noted along the cervical spine. Prevertebral soft tissues are within normal limits.  The visualized neural foramina are  grossly unremarkable.  The thyroid gland is unremarkable in appearance.  Diffuse interstitial prominence is noted at the lung apices, with small bilateral pleural effusions, concerning for pulmonary edema.  Mild calcification is noted along the left vertebral artery at the level of C2.  IMPRESSION:  1.  No evidence of fracture or subluxation along the cervical spine. 2.  Diffuse interstitial prominence of the lung apices, with small bilateral pleural effusions, concerning for pulmonary edema.   Original Report Authenticated By: Tonia Ghent, M.D.    Dg Pelvis Portable  03/31/2012  *RADIOLOGY REPORT*  Clinical Data: Postop  PORTABLE PELVIS  Comparison: 03/30/2012.  Findings: Portable film demonstrates satisfactory appearance status post left hemiarthroplasty.  IMPRESSION: Satisfactory position and alignment.   Original Report Authenticated By: Davonna Belling, M.D.    Dg Chest Port 1 View  04/01/2012  *RADIOLOGY REPORT*  Clinical Data: Fever.  PORTABLE CHEST - 1 VIEW  Comparison: Chest radiograph performed 03/30/2012  Findings: The lungs are mildly hypoexpanded.  Vascular congestion is noted, with mild increased interstitial markings, raise concern for mild pulmonary edema.  This is grossly stable from the prior study.  There is no evidence of pleural effusion or pneumothorax.  The cardiomediastinal silhouette is borderline normal in size.  No acute osseous abnormalities are seen.  IMPRESSION: Lungs mildly hypoexpanded.  Vascular congestion, with mildly increased interstitial markings, raising concern for mild pulmonary edema.  This appears relatively stable from the recent prior study.   Original Report Authenticated By: Tonia Ghent, M.D.    Dg Chest Port 1 View  03/30/2012  *RADIOLOGY REPORT*  Clinical Data: Preoperative chest radiograph.  PORTABLE CHEST - 1 VIEW  Comparison: Chest radiograph performed 02/26/2012  Findings: The lungs are well-aerated.  Vascular congestion is noted.  Bilateral central and  bibasilar airspace opacities may reflect mild pulmonary edema.  There is no pleural effusion or pneumothorax.  The cardiomediastinal silhouette is borderline normal in size.  No acute osseous abnormalities are seen.  IMPRESSION: Vascular congestion noted.  Bilateral central and bibasilar airspace opacities may reflect mild pulmonary edema.  This may be transient, due to the patient's recent fall.   Original Report Authenticated By: Tonia Ghent, M.D.     Subjective:  Caregiver brought him fast food today which he is eating well.  Denies pain, SOB, N/V/D/C.  States he cannot find his glasses.    Objective: Filed Vitals:   04/10/12 0500 04/10/12 1512 04/10/12 2200 04/11/12 0600  BP: 161/86 109/68 127/69 162/83  Pulse: 70 69 66 78  Temp: 97.8 F (36.6 C) 97.7 F (36.5 C) 98 F (36.7 C) 98.2 F (36.8 C)  TempSrc: Axillary Axillary Oral Oral  Resp: 18 18 20 20   Height:      Weight:      SpO2: 96% 98% 94% 97%    Intake/Output Summary (Last 24 hours) at 04/11/12 1459 Last data filed at 04/11/12 0900  Gross per 24 hour  Intake    240 ml  Output    300 ml  Net    -60 ml   Weight change:  Exam:   General:  Pt  is alert, no acute distress, lying in bed  HEENT: No icterus, MMM, [place]/AT  Cardiovascular: RRR, S1/S2, no rubs, no gallops  Respiratory: CTAB, no increased WOB  Abdomen: Soft/+BS, non tender, non distended, no guarding  Extremities: 2+ LLE edema.  Moves both legs spontaneously.    Data Reviewed: Basic Metabolic Panel: No results found for this basename: NA, K, CL, CO2, GLUCOSE, BUN, CREATININE, CALCIUM, MG, PHOS,  in the last 168 hours Liver Function Tests: No results found for this basename: AST, ALT, ALKPHOS, BILITOT, PROT, ALBUMIN,  in the last 168 hours No results found for this basename: LIPASE, AMYLASE,  in the last 168 hours No results found for this basename: AMMONIA,  in the last 168 hours CBC: No results found for this basename: WBC, NEUTROABS, HGB, HCT,  MCV, PLT,  in the last 168 hours Cardiac Enzymes: No results found for this basename: CKTOTAL, CKMB, CKMBINDEX, TROPONINI,  in the last 168 hours BNP: No components found with this basename: POCBNP,  CBG:  Recent Labs Lab 04/09/12 1354 04/09/12 2132 04/10/12 0725 04/10/12 1504 04/10/12 2155  GLUCAP 84 156* 95 174* 131*    Recent Results (from the past 240 hour(s))  URINE CULTURE     Status: None   Collection Time    04/01/12 11:09 PM      Result Value Range Status   Specimen Description URINE, CATHETERIZED   Final   Special Requests NONE   Final   Culture  Setup Time 04/02/2012 03:05   Final   Colony Count NO GROWTH   Final   Culture NO GROWTH   Final   Report Status 04/03/2012 FINAL   Final  CULTURE, BLOOD (ROUTINE X 2)     Status: None   Collection Time    04/01/12 11:15 PM      Result Value Range Status   Specimen Description BLOOD RIGHT ANTECUBITAL   Final   Special Requests BOTTLES DRAWN AEROBIC AND ANAEROBIC 4CC   Final   Culture  Setup Time 04/02/2012 03:06   Final   Culture NO GROWTH 5 DAYS   Final   Report Status 04/08/2012 FINAL   Final  CULTURE, BLOOD (ROUTINE X 2)     Status: None   Collection Time    04/01/12 11:24 PM      Result Value Range Status   Specimen Description BLOOD RIGHT HAND   Final   Special Requests BOTTLES DRAWN AEROBIC AND ANAEROBIC 3CC   Final   Culture  Setup Time 04/02/2012 03:06   Final   Culture NO GROWTH 5 DAYS   Final   Report Status 04/08/2012 FINAL   Final     Scheduled Meds: . amLODipine  10 mg Oral Daily  . ARIPiprazole  5 mg Oral Daily  . aspirin EC  325 mg Oral BID  . atenolol  100 mg Oral Daily  . calcium carbonate  1 tablet Oral Q breakfast  . clonazePAM  0.25 mg Oral BID  . cloNIDine  0.1 mg Transdermal Weekly  . docusate sodium  100 mg Oral BID  . feeding supplement  1 Container Oral TID BM  . ferrous sulfate  325 mg Oral BID WC  . haloperidol  10 mg Oral BID  . haloperidol  5 mg Oral Once  . heparin  subcutaneous  5,000 Units Subcutaneous Q8H  . insulin aspart  0-9 Units Subcutaneous TID WC  . multivitamin with minerals  1 tablet Oral Daily  . OXcarbazepine  450 mg Oral  BID  . pantoprazole  40 mg Oral Daily  . trihexyphenidyl  5 mg Oral BID WC  . vitamin B-12  1,000 mcg Oral Daily   Continuous Infusions:     Renae Fickle, MD  [facility] Pager [phone]  If 7PM-7AM, please contact night-coverage [email or web address] Password [email or web address] 04/11/2012, 2:59 PM   LOS: 12 days

## 2012-04-11 NOTE — Consult Note (Signed)
Patient Identification:  David Lang Date of Evaluation:  04/11/2012 Reason for Consult: Schizophrenia, Paranoid; capacity  Referring Provider: Dr. Malachi Bonds  History of Present Illness:David Lang is a 62 yo man with history of childhood mental illness.  He was diagnosed with Schizophrenia paranoid type after he came to the [place]. From [place].  He has a sister in [place], and MD who is working on [employer] and another sister in [place]  He has been oppositional delusional and paranoid about his care, first at [facility], then [facility] and now [facility].  He demands to know every pill, insists staff go to the store for him but refuses to work with treatment plans after he received corrective surgery for a femoral fracture.   He is very loud but difficult to understand.  He worked with PT today but he has been refusing which means his ability to ambulate will be impaired. This places him at risk for contractures and decubiti.  Past Psychiatric History: Very vague history of having mental illness in [place].   He was treated in the [place] and sister tried to supervise his adherence to medication but at long distance he refused medication for the past year.    Past Medical History:     Past Medical History  Diagnosis Date  . Hypertension   . Diabetes mellitus without complication   . Mental disorder   . Schizophrenia        Past Surgical History  Procedure Laterality Date  . Hip arthroplasty Left 03/31/2012    Procedure: ARTHROPLASTY BIPOLAR HIP;  Surgeon: Mable Paris, MD;  Location: [facility];  Service: Orthopedics;  Laterality: Left;    Allergies: No Known Allergies  Current Medications:  Prior to Admission medications   Medication Sig Start Date End Date Taking? Authorizing Provider  aspirin EC 325 MG tablet Take 1 tablet (325 mg total) by mouth 2 (two) times daily. 03/31/12   Jiles Harold, PA-C  oxyCODONE-acetaminophen (ROXICET) 5-325 MG per tablet Take 1-2 tablets by mouth every 4 (four) hours as needed  for pain. 03/31/12   Jiles Harold, PA-C    Social History:    reports that he has never smoked. He has never used smokeless tobacco. He reports that he does not drink alcohol or use illicit drugs.   Family History:    History reviewed. No pertinent family history.  Mental Status Examination/Evaluation: Objective:  Appearance: Disheveled  Eye Contact::  Fair  Speech:  Garbled, Pressured and poor enunciation  Volume:  too loud, heard down the hall. Possibly hard of hearing?  Mood:  Obstinate, paranoid  Affect:  Blunt  Thought Process:  Goal Directed and Irrelevant  Orientation:  Other:  oriented to self and his delusions  Thought Content:  Paranoid Ideation  Suicidal Thoughts:  No  Homicidal Thoughts:  No  Judgement:  Impaired  Insight:  Lacking   DIAGNOSIS:   AXIS I   Schizophrenia, paranoid type   AXIS II  Deferred  AXIS III See medical notes.  AXIS IV educational problems, other psychosocial or environmental problems, problems related to social environment, problems with access to health care services, problems with primary support group and pt not cooperating with PT redhab post op; Sister trying to obtain POA - court date dely.  AXIS V 41-50 serious symptoms impaired reality testing   Assessment/Plan:  Discussed with Psych CSW Pt is obstinate to staff requests, e.g. Simply move up in bed (or be helped) so 'you' can sit up better when head of  bed is raised.  He argues.  Now decision is to move him to SNF.  RECOMMENDATION:  1.  Pt does not have capacity 2.  POA by sisters is delayed; will try to call in am 3.  Concern for pt rehabilitation persists, try to discuss with PT in am  4.  Will follow pt Soila Printup MD 04/11/2012 10:56 PM

## 2012-04-12 LAB — GLUCOSE, CAPILLARY
Glucose-Capillary: 136 mg/dL — ABNORMAL HIGH (ref 70–99)
Glucose-Capillary: 92 mg/dL (ref 70–99)
Glucose-Capillary: 118 mg/dL — ABNORMAL HIGH (ref 70–99)

## 2012-04-12 MED ORDER — CLONIDINE HCL 0.2 MG/24HR TD PTWK
0.2000 mg | MEDICATED_PATCH | TRANSDERMAL | Status: DC
  Filled 2012-04-12: qty 1

## 2012-04-12 NOTE — Consult Note (Signed)
Patient Identification:  David Lang Date of Evaluation:  04/12/2012 Reason for Consult:  Schizophrenia, paranoid  Referring Provider:  Dr. Judie Petit. Short  History of Present Illness:  David Lang is a 62 yo man with history of childhood mental illness. He was diagnosed with Schizophrenia paranoid type after he came to the [place]. From [place]. He has a sister in [place], and MD who is working on [employer] and another sister in [place] He has been oppositional delusional and paranoid about his care, first at [facility], then [facility] and now [facility]..  Pt has been speaking in a very raspy loud voice.  Today he appears to be resting.  He wakens and speaks in a normal tone.   Past Psychiatric History:  There is a Very vague history of having mental illness in [place]. He was treated in the [place] and sister tried to supervise his adherence to medication but at long distance he refused medication for the past year.   Past Medical History:     Past Medical History  Diagnosis Date  . Hypertension   . Diabetes mellitus without complication   . Mental disorder   . Schizophrenia        Past Surgical History  Procedure Laterality Date  . Hip arthroplasty Left 03/31/2012    Procedure: ARTHROPLASTY BIPOLAR HIP;  Surgeon: Mable Paris, MD;  Location: [facility];  Service: Orthopedics;  Laterality: Left;    Allergies: No Known Allergies  Current Medications:  Prior to Admission medications   Medication Sig Start Date End Date Taking? Authorizing Provider  aspirin EC 325 MG tablet Take 1 tablet (325 mg total) by mouth 2 (two) times daily. 03/31/12   Jiles Harold, PA-C  oxyCODONE-acetaminophen (ROXICET) 5-325 MG per tablet Take 1-2 tablets by mouth every 4 (four) hours as needed for pain. 03/31/12   Jiles Harold, PA-C    Social History:    reports that he has never smoked. He has never used smokeless tobacco. He reports that he does not drink alcohol or use illicit drugs.   Family History:    History reviewed. No  pertinent family history.  Mental Status Examination/Evaluation: Objective:  Appearance: Casual, Disheveled and beard has been trimmed.; calm  Eye Contact::  Fair  Speech:  Clear and Coherent and moe understandable  Volume:  Normal  Mood:  Distrusting sisters  Affect:  Blunt  Thought Process:  Tangential  Orientation:  Other:  Pt is aware of hospital, not sure if he recognizes surgery - refuses to cooperated with OT and instrustions.   Thought Content:  Paranoid Ideation  Suicidal Thoughts:  No  Homicidal Thoughts:  No  Judgement:  Impaired  Insight:  Lacking   DIAGNOSIS:   AXIS I   Schizophrenia, paranoid type   AXIS II  Deferred  AXIS III See medical notes.  AXIS IV economic problems, housing problems, other psychosocial or environmental problems, problems related to social environment, problems with primary support group and Power of Attorney still pending; would like sister in [place], [place] to visit  AXIS V 41-50 serious symptoms   Assessment/Plan: Dr. Judie Petit. Short,  Psych CSW Pt is in a calm moment where he can speak calmly about his family.  All description of incidents are laden with references to fraud and deceit.  He claims his sisters are taking his possessions.   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   He had no angry affect, just talked about it in narrative  style.  He would stop at times, appear to dose, then waken and speak again.      This is the calmest affect and mood witnessed in this man since first encounter.  \   He continues to move his (knees), legs contrary to Surgeon's instructions. RECOMMENDATION:  1.  Pt lacks capacity, continue IVC 2.  Paranoid thoughts persists 3.  Pt's incision has been healing well; urge PT as instructed.  4.  Encourage nutritional intake.  5.  Will follow * NB  Call has been made to Sister in [place]. Return call expected.  Mickeal Skinner MD 04/12/2012 8:02 PM

## 2012-04-12 NOTE — Progress Notes (Signed)
Calorie Count Note  Intervention:  - Nursing to continue to encourage increased meal and supplement intake - RD to continue to monitor and analyze calorie count  48 hour calorie count ordered.  Diet: Regular Supplements: Resource Breeze TID  3/17 Breakfast: 105 calories, 1g protein Lunch: 0% Dinner: 925 calories, 21g protein  Supplements: 0% intake  Total intake: 1030 kcal (49% of minimum estimated needs)  22g protein (26% of minimum estimated needs)    Levon Hedger MS, RD, LDN [phone] Pager [phone] After Hours Pager

## 2012-04-12 NOTE — Progress Notes (Signed)
Clinical Social Work  Patient's sister returned CSW phone call. Sister reports no court date has been set for patient yet. Sister reports that family remains involved with care and has hired someone to check on patient throughout hospital stay and to bring him food. Sister is trying to make plans to come and visit patient as well. Sister has CSW contact information and is agreeable to call CSW with any updates regarding guardianship.  [place], [place] [phone]

## 2012-04-12 NOTE — Progress Notes (Signed)
Clinical Social Work  Patient was discussed during [employer]. Patient's IVC paperwork expire on 04/13/12. CSW staffed case with psych MD who recommends IVC paperwork be renewed. CSW called patient's sister (Mrs. Raiford Noble) and left a message to follow up on status of guardianship. CSW will continue to follow to assist and unit CSW is working on SNF placement.  [place], [place] [phone]

## 2012-04-12 NOTE — Progress Notes (Signed)
PATIENT ID: David Lang   12 Days Post-Op Procedure(s) (LRB): ARTHROPLASTY BIPOLAR HIP (Left)  Subjective: No c/o L hip pain, ambulated with PT today.   Objective:  Filed Vitals:   04/12/12 1300  BP: 162/73  Pulse: 68  Temp: 97.8 F (36.6 C)  Resp: 18     L hip incision healed, no erythema or drainage Leg lengths =  No calf TTP or swelling.  Labs:  No results found for this basename: HGB,  in the last 72 hoursNo results found for this basename: WBC, RBC, HCT, PLT,  in the last 72 hoursNo results found for this basename: NA, K, CL, CO2, BUN, CREATININE, GLUCOSE, CALCIUM,  in the last 72 hours  Assessment and Plan: POD 12 s/p L hip hemi WBAT, starting to mobilize better Knee immobilizer when in bed Staples out   VTE proph: ECASA BID, SCDs

## 2012-04-12 NOTE — Progress Notes (Signed)
Physical Therapy Treatment Patient Details Name: David Lang MRN: [id number] DOB: 10-16-1950 Today's Date: 04/12/2012 Time: 5621-3086 PT Time Calculation (min): 12 min  PT Assessment / Plan / Recommendation Comments on Treatment Session  Pt ambulated in hallway today however declined increasing distance and continues to require assist for standing and ambulation.    Follow Up Recommendations  SNF     Does the patient have the potential to tolerate intense rehabilitation     Barriers to Discharge        Equipment Recommendations  Rolling walker with 5" wheels    Recommendations for Other Services    Frequency     Plan Discharge plan remains appropriate    Precautions / Restrictions Precautions Precautions: Posterior Hip;Fall Precaution Comments: Sign in room, pt unaware of THP Restrictions LLE Weight Bearing: Weight bearing as tolerated   Pertinent Vitals/Pain n/a    Mobility  Bed Mobility Bed Mobility: Sit to Supine Sit to Supine: 5: Supervision;HOB flat Details for Bed Mobility Assistance: pt sitting EOB trying to get up on his own with bed alarm ringing upon entering room Transfers Transfers: Sit to Stand;Stand to Sit Sit to Stand: 1: +2 Total assist;From elevated surface;With upper extremity assist;From bed Sit to Stand: Patient Percentage: 50% Stand to Sit: 1: +2 Total assist;With upper extremity assist;To bed Stand to Sit: Patient Percentage: 50% Details for Transfer Assistance: verbal cues for safe technique, pt required encouragement but eventually followed commands Ambulation/Gait Ambulation/Gait Assistance: 1: +2 Total assist Ambulation/Gait: Patient Percentage: 60% Ambulation Distance (Feet): 30 Feet Assistive device: Rolling walker Ambulation/Gait Assistance Details: verbal cues for safe use of RW and turning, encouraged pt to increase distance however he declined Gait Pattern: Step-through pattern;Decreased stride length;Trunk flexed;Decreased stance  time - left Gait velocity: decreased    Exercises     PT Diagnosis:    PT Problem List:   PT Treatment Interventions:     PT Goals Acute Rehab PT Goals PT Goal Formulation: Patient unable to participate in goal setting Time For Goal Achievement: 04/26/12 Potential to Achieve Goals: Good Pt will go Sit to Stand: with supervision PT Goal: Sit to Stand - Progress: Goal set today Pt will go Stand to Sit: with supervision PT Goal: Stand to Sit - Progress: Goal set today Pt will Ambulate: 51 - 150 feet;with min assist;with rolling walker PT Goal: Ambulate - Progress: Goal set today Pt will Perform Home Exercise Program: with supervision, verbal cues required/provided PT Goal: Perform Home Exercise Program - Progress: Goal set today  Visit Information  Last PT Received On: 04/12/12 Assistance Needed: +2    Subjective Data  Subjective: I want bean burrito.   Cognition  Cognition Overall Cognitive Status: Impaired Area of Impairment: Memory;Safety/judgement;Awareness of errors;Problem solving Arousal/Alertness: Awake/alert Memory: Decreased recall of precautions Following Commands: Follows one step commands consistently Safety/Judgement: Decreased awareness of safety precautions;Decreased safety judgement for tasks assessed Awareness of Errors: Assistance required to identify errors made;Assistance required to correct errors made Cognition - Other Comments: pt trying to get OOB with bed alarm going off upon arriving to room    Balance     End of Session PT - End of Session Equipment Utilized During Treatment: Gait belt Activity Tolerance: Patient limited by fatigue Patient left: in bed;with call bell/phone within reach;with bed alarm set   GP     Merida Alcantar,KATHrine E 04/12/2012, 3:43 PM Zenovia Jarred, PT, DPT 04/12/2012 Pager: [phone]

## 2012-04-12 NOTE — Progress Notes (Signed)
[facility] PROGRESS NOTE  David Lang QMV:[id number] DOB: 11-04-1950 DOA: 03/30/2012 PCP: Default, Provider, MD   Assessment/Plan:  63 yo M with paranoid schizophrenia who presented from [facility] with fall causing left hip fracture requiring left hip arthoplasty on 3/6.  His course has been complicated by his paranoid schizophrenia which has caused him to refuse medications, refuse food, refuse his brace, refuse physical therapy.  He had an episode of hypoglycemia due to not eating.  Ethics committee was involved to determine how much care we were able to provide, such as forcing fingersticks, blood draws, etc, however, he is now taking his medications, getting out of bed intermittently with assistance, and eating a little bit more than before.  See dispo section for more information.    Paranoid schizophrenia with confusion, paranoia and disorganized thinking.  Starting to eat some.     -  IVC paperwork completed -  Cont abilify 5mg  daily -  Haldol 10mg  po BID -  Triletpal 450mg  BID -  Artane 5mg  BID -  Clonazepam BID -  Haldol IM prn agitation  Refusal to eat, improving:   Ate a small amount yesterday -  Calorie count continues -  Weekly weights -  Family to bring foods from home if possible.   -  Appreciate nutrition assistance.  Hypoglycemia, likely from not eating in many days, BG low normal.  Resolved -  Pt eating small amounts and fingerstick is stable -  Cont glucagon prn  HCAP/Hypoxemia, resolved.  Completed levofloxacin 3/14.  Left subcapital femoral neck fracture s/p Left hip hemiarthroplasty, 03/31/2012.  Continues to refuse the brace and shouts that it is strangling him continuously until the brace is taken off.   -  WBAT LLE -  OOB qshift -  Please KEEP brace on while in bed.  TAPE brace on if necessary.   -  Staples taken out today  CKD stage 3 -  Minimize nephrotoxins -  Renally dose medications if needed.  Hypertension BP still elevated -  Continue atenolol  and norvasc -  Increase clonidine patch, expect to see change in BP on 3/21  Diabetes mellitus type 2  - Hemoglobin A1c 8.8 on 03/22/2012  -Continue NovoLog sliding scale   Anemia due to b12 and iron deficiency -Repeat serum B12 as this was marginally low on 03/22/2012--> repeat B12 on 04/01/2012--276  -Continue oral B12  -continue ferrous sulfate - Needs outpatient GI follow up -Check RBC folate-655  DIET:  Regular ACCESS:  None IVF:  OFF PROPH:  SCDs and heparin  CODE: Full code Family Communication: Pt alone.  Called Dr. [name] [phone], his sister, with updates.  Please call her daily with information about his care.  Disposition Plan:  Sister, [name], is a pediatrician who lives in [place] and has been caring for her brother distantly.  She has been prescribing medications to him which were started at [facility] many years ago because he refused to see any doctors.  She has sent paperwork to a local lawyer who is helping her obtain guardianship of her brother.  I wrote a letter to him stating the patient does not have capacity.  We are awaiting a SNF bed for PT/OT.  Ethics committee has been involved because he previously was refusing food, medication, and therapies.    Antibiotics:  Vancomycin/Zosyn 04/02/2012>>>04/04/12 Levofloxacin 04/05/12>>> 3/16   Procedures/Studies: Dg Hip Complete Left  03/30/2012  s*RADIOLOGY REPORT*  Clinical Data: Status post fall; left hip pain.  LEFT HIP -  COMPLETE 2+ VIEW  Comparison: None.  Findings: There is a mildly displaced subcapital fracture involving the left femoral neck, with mild rotation of the femoral head.  The left femoral head remains seated at the acetabulum.  The right hip joint is unremarkable in appearance.  The sacroiliac joints are unremarkable in appearance.  The visualized bowel gas pattern is grossly unremarkable.  IMPRESSION: Mildly displaced subcapital fracture involving the left femoral neck.   Original Report  Authenticated By: Tonia Ghent, M.D.    Ct Head Wo Contrast  03/30/2012  *RADIOLOGY REPORT*  Clinical Data:  Status post fall; laceration above the left eye. Concern for head or cervical spine injury.  CT HEAD WITHOUT CONTRAST AND CT CERVICAL SPINE WITHOUT CONTRAST  Technique:  Multidetector CT imaging of the head and cervical spine was performed following the standard protocol without intravenous contrast.  Multiplanar CT image reconstructions of the cervical spine were also generated.  Comparison: None  CT HEAD  Findings: There is no evidence of acute infarction, mass lesion, or intra- or extra-axial hemorrhage on CT.  Prominence of the sulci suggests mild cortical volume loss. Relatively diffuse periventricular and subcortical white matter change likely reflects small vessel ischemic microangiopathy. Cerebellar atrophy is noted.  A small chronic lacunar infarct is noted at the anterior right thalamus.  Chronic ischemic change is seen at the external capsule bilaterally.  The brainstem and fourth ventricle are within normal limits.  The cerebral hemispheres demonstrate grossly normal gray-white differentiation.  No mass effect or midline shift is seen.   There is no evidence of fracture; visualized osseous structures are unremarkable in appearance.  The orbits are within normal limits.  The paranasal sinuses and mastoid air cells are well- aerated.  The known soft tissue laceration is not well characterized on CT.  IMPRESSION:  1.  No evidence of traumatic intracranial injury or fracture. 2.  Mild cortical volume loss and diffuse small vessel ischemic microangiopathy. 3.  Small chronic infarct at the anterior right thalamus, and chronic ischemic change at the external capsule bilaterally.  CT CERVICAL SPINE  Findings: There is no evidence of fracture or subluxation. Vertebral bodies demonstrate normal height and alignment. Intervertebral disc spaces are preserved.  Small anterior disc osteophyte complexes are  noted along the cervical spine. Prevertebral soft tissues are within normal limits.  The visualized neural foramina are grossly unremarkable.  The thyroid gland is unremarkable in appearance.  Diffuse interstitial prominence is noted at the lung apices, with small bilateral pleural effusions, concerning for pulmonary edema.  Mild calcification is noted along the left vertebral artery at the level of C2.  IMPRESSION:  1.  No evidence of fracture or subluxation along the cervical spine. 2.  Diffuse interstitial prominence of the lung apices, with small bilateral pleural effusions, concerning for pulmonary edema.   Original Report Authenticated By: Tonia Ghent, M.D.    Ct Cervical Spine Wo Contrast  03/30/2012  *RADIOLOGY REPORT*  Clinical Data:  Status post fall; laceration above the left eye. Concern for head or cervical spine injury.  CT HEAD WITHOUT CONTRAST AND CT CERVICAL SPINE WITHOUT CONTRAST  Technique:  Multidetector CT imaging of the head and cervical spine was performed following the standard protocol without intravenous contrast.  Multiplanar CT image reconstructions of the cervical spine were also generated.  Comparison: None  CT HEAD  Findings: There is no evidence of acute infarction, mass lesion, or intra- or extra-axial hemorrhage on CT.  Prominence of the sulci suggests mild cortical volume loss.  Relatively diffuse periventricular and subcortical white matter change likely reflects small vessel ischemic microangiopathy. Cerebellar atrophy is noted.  A small chronic lacunar infarct is noted at the anterior right thalamus.  Chronic ischemic change is seen at the external capsule bilaterally.  The brainstem and fourth ventricle are within normal limits.  The cerebral hemispheres demonstrate grossly normal gray-white differentiation.  No mass effect or midline shift is seen.   There is no evidence of fracture; visualized osseous structures are unremarkable in appearance.  The orbits are within normal  limits.  The paranasal sinuses and mastoid air cells are well- aerated.  The known soft tissue laceration is not well characterized on CT.  IMPRESSION:  1.  No evidence of traumatic intracranial injury or fracture. 2.  Mild cortical volume loss and diffuse small vessel ischemic microangiopathy. 3.  Small chronic infarct at the anterior right thalamus, and chronic ischemic change at the external capsule bilaterally.  CT CERVICAL SPINE  Findings: There is no evidence of fracture or subluxation. Vertebral bodies demonstrate normal height and alignment. Intervertebral disc spaces are preserved.  Small anterior disc osteophyte complexes are noted along the cervical spine. Prevertebral soft tissues are within normal limits.  The visualized neural foramina are grossly unremarkable.  The thyroid gland is unremarkable in appearance.  Diffuse interstitial prominence is noted at the lung apices, with small bilateral pleural effusions, concerning for pulmonary edema.  Mild calcification is noted along the left vertebral artery at the level of C2.  IMPRESSION:  1.  No evidence of fracture or subluxation along the cervical spine. 2.  Diffuse interstitial prominence of the lung apices, with small bilateral pleural effusions, concerning for pulmonary edema.   Original Report Authenticated By: Tonia Ghent, M.D.    Dg Pelvis Portable  03/31/2012  *RADIOLOGY REPORT*  Clinical Data: Postop  PORTABLE PELVIS  Comparison: 03/30/2012.  Findings: Portable film demonstrates satisfactory appearance status post left hemiarthroplasty.  IMPRESSION: Satisfactory position and alignment.   Original Report Authenticated By: Davonna Belling, M.D.    Dg Chest Port 1 View  04/01/2012  *RADIOLOGY REPORT*  Clinical Data: Fever.  PORTABLE CHEST - 1 VIEW  Comparison: Chest radiograph performed 03/30/2012  Findings: The lungs are mildly hypoexpanded.  Vascular congestion is noted, with mild increased interstitial markings, raise concern for mild pulmonary  edema.  This is grossly stable from the prior study.  There is no evidence of pleural effusion or pneumothorax.  The cardiomediastinal silhouette is borderline normal in size.  No acute osseous abnormalities are seen.  IMPRESSION: Lungs mildly hypoexpanded.  Vascular congestion, with mildly increased interstitial markings, raising concern for mild pulmonary edema.  This appears relatively stable from the recent prior study.   Original Report Authenticated By: Tonia Ghent, M.D.    Dg Chest Port 1 View  03/30/2012  *RADIOLOGY REPORT*  Clinical Data: Preoperative chest radiograph.  PORTABLE CHEST - 1 VIEW  Comparison: Chest radiograph performed 02/26/2012  Findings: The lungs are well-aerated.  Vascular congestion is noted.  Bilateral central and bibasilar airspace opacities may reflect mild pulmonary edema.  There is no pleural effusion or pneumothorax.  The cardiomediastinal silhouette is borderline normal in size.  No acute osseous abnormalities are seen.  IMPRESSION: Vascular congestion noted.  Bilateral central and bibasilar airspace opacities may reflect mild pulmonary edema.  This may be transient, due to the patient's recent fall.   Original Report Authenticated By: Tonia Ghent, M.D.     Subjective:  Requesting more fast food, burrito.  Continue .  Denies pain, SOB, N/V/D/C.   Objective: Filed Vitals:   04/10/12 2200 04/11/12 0600 04/11/12 2200 04/12/12 1300  BP: 127/69 162/83 156/86 162/73  Pulse: 66 78 65 68  Temp: 98 F (36.7 C) 98.2 F (36.8 C) 97.8 F (36.6 C) 97.8 F (36.6 C)  TempSrc: Oral Oral Oral Oral  Resp: 20 20 18 18   Height:      Weight:      SpO2: 94% 97% 96% 97%    Intake/Output Summary (Last 24 hours) at 04/12/12 1718 Last data filed at 04/12/12 1642  Gross per 24 hour  Intake    360 ml  Output      1 ml  Net    359 ml   Weight change:  Exam:   General:  Pt is alert, no acute distress, lying in bed  HEENT: No icterus, MMM, Edwards/AT  Cardiovascular: RRR,  S1/S2, no rubs, no gallops  Respiratory: CTAB, no increased WOB  Abdomen: Soft/+BS, non tender, non distended, no guarding  Extremities: 1+ LLE edema.  Moves both legs spontaneously.  Left leg staples intact without erythema, c/d/i  Data Reviewed: Basic Metabolic Panel: No results found for this basename: NA, K, CL, CO2, GLUCOSE, BUN, CREATININE, CALCIUM, MG, PHOS,  in the last 168 hours Liver Function Tests: No results found for this basename: AST, ALT, ALKPHOS, BILITOT, PROT, ALBUMIN,  in the last 168 hours No results found for this basename: LIPASE, AMYLASE,  in the last 168 hours No results found for this basename: AMMONIA,  in the last 168 hours CBC: No results found for this basename: WBC, NEUTROABS, HGB, HCT, MCV, PLT,  in the last 168 hours Cardiac Enzymes: No results found for this basename: CKTOTAL, CKMB, CKMBINDEX, TROPONINI,  in the last 168 hours BNP: No components found with this basename: POCBNP,  CBG:  Recent Labs Lab 04/11/12 1155 04/11/12 1733 04/11/12 2312 04/12/12 1008 04/12/12 1504  GLUCAP 126* 130* 136* 94 92    No results found for this or any previous visit (from the past 240 hour(s)).   Scheduled Meds: . amLODipine  10 mg Oral Daily  . ARIPiprazole  5 mg Oral Daily  . aspirin EC  325 mg Oral BID  . atenolol  100 mg Oral Daily  . calcium carbonate  1 tablet Oral Q breakfast  . clonazePAM  0.25 mg Oral BID  . [START ON 04/15/2012] cloNIDine  0.2 mg Transdermal Weekly  . docusate sodium  100 mg Oral BID  . feeding supplement  1 Container Oral TID BM  . ferrous sulfate  325 mg Oral BID WC  . haloperidol  10 mg Oral BID  . haloperidol  5 mg Oral Once  . heparin subcutaneous  5,000 Units Subcutaneous Q8H  . insulin aspart  0-9 Units Subcutaneous TID WC  . multivitamin with minerals  1 tablet Oral Daily  . OXcarbazepine  450 mg Oral BID  . pantoprazole  40 mg Oral Daily  . trihexyphenidyl  5 mg Oral BID WC  . vitamin B-12  1,000 mcg Oral Daily    Continuous Infusions:     Renae Fickle, MD  [facility] Pager [phone]  If 7PM-7AM, please contact night-coverage [email or web address] Password [email or web address] 04/12/2012, 5:18 PM   LOS: 13 days

## 2012-04-12 NOTE — Progress Notes (Addendum)
Pt is refusing to lay in the bed. Pt wants to sit up. Pt has been educated on pt safety, pt still refuses. Pt still refusing to lay down. Bed alarm set to go off if pt stands. Pt alert and orientated.

## 2012-04-13 DIAGNOSIS — E119 Type 2 diabetes mellitus without complications: Secondary | ICD-10-CM

## 2012-04-13 DIAGNOSIS — N179 Acute kidney failure, unspecified: Secondary | ICD-10-CM

## 2012-04-13 LAB — GLUCOSE, CAPILLARY
Glucose-Capillary: 101 mg/dL — ABNORMAL HIGH (ref 70–99)
Glucose-Capillary: 103 mg/dL — ABNORMAL HIGH (ref 70–99)

## 2012-04-13 NOTE — Progress Notes (Signed)
Clinical Social Work  CSW faxed IVC paperwork to [employer] and called to confirm paperwork was received. Magistrate reports that [employer] will serve patient within 24 hours. CSW will continue to follow.  [place], [place] [phone]

## 2012-04-13 NOTE — Progress Notes (Signed)
Physical Therapy Treatment Patient Details Name: David Lang MRN: [id number] DOB: December 05, 1950 Today's Date: 04/13/2012 Time: 1200-1229 PT Time Calculation (min): 29 min  PT Assessment / Plan / Recommendation Comments on Treatment Session  Pt declined ambulation/use of RW on today. Able to get pt to sit EOB to assist with toileting. Called sister David Lang) at pts request and for sister to encourage pt to participate. Pt remains unaware of hip precautions. Pt becomes agitated and resistive when therapist attemtped to use pillow to protect L hip (especially with pt rolling onto R side )    Follow Up Recommendations  SNF     Does the patient have the potential to tolerate intense rehabilitation     Barriers to Discharge        Equipment Recommendations  Rolling walker with 5" wheels    Recommendations for Other Services    Frequency Min 5X/week   Plan Discharge plan remains appropriate    Precautions / Restrictions Precautions Precautions: Posterior Hip;Fall Precaution Comments: Sign in room, pt unaware of THP Restrictions Weight Bearing Restrictions: No LLE Weight Bearing: Weight bearing as tolerated   Pertinent Vitals/Pain No c/o pain    Mobility  Bed Mobility Bed Mobility: Supine to Sit;Scooting to HOB Supine to Sit: 3: Mod assist Sit to Supine: HOB flat;6: Supervision (Device/Increase time) Scooting to [facility]: 4: Min guard Details for Bed Mobility Assistance: Max encouragement for participation. Assist for trunk to upright and Les off bed. Utilized bedpad. Pt able to return to supine with Supervision/Min guard assist.  Transfers Details for Transfer Assistance: Pt declined to stand with therapy despite encouragment from therapist.  Ambulation/Gait Ambulation/Gait Assistance: Not tested (comment) Ambulation/Gait Assistance Details: Pt declined to participate    Exercises     PT Diagnosis:    PT Problem List:   PT Treatment Interventions:     PT Goals Acute Rehab PT  Goals Pt will go Supine/Side to Sit: with supervision PT Goal: Supine/Side to Sit - Progress: Progressing toward goal Pt will go Sit to Supine/Side: with supervision PT Goal: Sit to Supine/Side - Progress: Progressing toward goal  Visit Information  Last PT Received On: 04/13/12 Assistance Needed: +2    Subjective Data  Subjective: i know my capabilities Patient Stated Goal: none stated   Cognition  Cognition Overall Cognitive Status: Impaired Area of Impairment: Memory;Safety/judgement;Awareness of errors;Awareness of deficits Arousal/Alertness: Awake/alert Orientation Level: Disoriented to;Place;Time;Situation Behavior During Session: [facility] for tasks performed Memory: Decreased recall of precautions Following Commands: Follows one step commands consistently Safety/Judgement: Decreased awareness of safety precautions;Decreased safety judgement for tasks assessed Awareness of Errors: Assistance required to identify errors made;Assistance required to correct errors made    Balance     End of Session PT - End of Session Activity Tolerance:  (Limited by pt declining to participate fully) Patient left: in bed;with call bell/phone within reach;with bed alarm set   GP     Rebeca Alert, MPT Pager: [phone]

## 2012-04-13 NOTE — Progress Notes (Signed)
Calorie Count Note  48 hour calorie count ordered however per MD notes, continue calorie count.   Diet: Regular  Supplements: Resource Breeze TID  Pt with box of dozen untouched [employer] donuts at bedside with a burrito, also untouched, on top of donut box. Pt asleep. RN reports the only thing pt has consumed today has been water and 1 container of milk. MD has been encouraging family to bring in food. Staff continuing to encourage pt to eat. Strongly recommend MD discuss PEG placement as pt continues to refuse to eat adequate amounts of food during admission. Pt may benefit from appetite stimulant, however this will likely take more than a week to take affect. RD to continue to monitor and analyze calorie count.    Levon Hedger MS, RD, LDN [phone] Pager [phone] After Hours Pager

## 2012-04-13 NOTE — Consult Note (Signed)
Patient Identification:  David Lang Date of Evaluation:  04/13/2012 Reason for Consult:  Capacity  Referring Provider:Dr. Cena Benton  History of Present Illness:David Lang is a 62 yo man with history of childhood mental illness. He was diagnosed with Schizophrenia paranoid type after he came to the [place]. From [place]. He has a sister in [place], and MD who is working on [employer] and another sister in [place] He has been oppositional delusional and paranoid about his care, first at [facility], then [facility] and now [facility]. He demands to know every pill, insists staff go to the store for him but refuses to work with treatment plans after he received corrective surgery for a femoral fracture. He is very loud but difficult to understand. He worked with PT today but he has been refusing which means his ability to ambulate will be impaired. This places him at risk for contractures and decubiti.  He has been uncooperative with PT .    Past Psychiatric History:He has a very vague history of having mental illness in [place]. He was treated in the [place] and sister tried to supervise his adherence to medication but at long distance he refused medication for the past year.   Past Medical History:     Past Medical History  Diagnosis Date  . Hypertension   . Diabetes mellitus without complication   . Mental disorder   . Schizophrenia        Past Surgical History  Procedure Laterality Date  . Hip arthroplasty Left 03/31/2012    Procedure: ARTHROPLASTY BIPOLAR HIP;  Surgeon: Mable Paris, MD;  Location: [facility];  Service: Orthopedics;  Laterality: Left;    Allergies: No Known Allergies  Current Medications:  Prior to Admission medications   Medication Sig Start Date End Date Taking? Authorizing Provider  aspirin EC 325 MG tablet Take 1 tablet (325 mg total) by mouth 2 (two) times daily. 03/31/12   Jiles Harold, PA-C  oxyCODONE-acetaminophen (ROXICET) 5-325 MG per tablet Take 1-2 tablets by mouth every 4 (four) hours  as needed for pain. 03/31/12   Jiles Harold, PA-C    Social History:    reports that he has never smoked. He has never used smokeless tobacco. He reports that he does not drink alcohol or use illicit drugs.   Family History:    History reviewed. No pertinent family history.  Mental Status Examination/Evaluation: Objective:  Appearance: Casual and beard and hair have been trimmed  Eye Contact::  Absent  Speech:  NA  Volume:  NA  Mood:    Affect:  Flat  Thought Process:  NA  Orientation:  NA  Thought Content:  NA  Suicidal Thoughts:  No  Homicidal Thoughts:  No  Judgement:  NA  Insight:  NA   DIAGNOSIS:   AXIS I   Schizophrenia, paranoid type,   AXIS II  Deferred  AXIS III See medical notes.  AXIS IV economic problems, housing problems, other psychosocial or environmental problems, problems related to social environment, problems with primary support group and Pt is paranoid and believes he is unable to trust his sisters  AXIS V 31-40 impairment in reality testing   Assessment/Plan:  Discussed with Dr. Cena Benton, Psych CSW,  CSW and MD have discussed pt with sister in [place].  She says the POA is on waiting list for [facility] [employer].  Former caregiver brought in food for pt.  He did not touch it untill ~ 5 pm.  Nutrition is a critical issue now.  He needs  to be encouraged to promote healing and general well being.  He is sleeping and does not waken to calling his name several times.    RECOMMENDATION:  1.  Pt does not have capacity 2.  Continue IVC, renewed. 3.  Please give more attention to eating, discover requests for meals 4.  Continue to encourage PT exercizes to avoid loss of ambulation.  5.  Will continue to follow Brittain Hosie MD 04/13/2012 5:23 PM

## 2012-04-13 NOTE — Progress Notes (Signed)
Notified that patient had fallen in BR while attempting to urinate in toilet. Amy, RN states that she had reset bed alarm, however, no alarm sounded when patient got OOB and up to BR. Upon entering room, patient back in bed. Pt. states he was not hurt but that he had hit the right side of his back. Nursing student who discovered patient on floor states that she thought he may have hit his head. Dr. Cena Benton then came into room. Director and MD assessed patient's head and back and no injury or redness noticed to head or back. VSS. Pt. Stated again that he did not hurt himself. Post fall huddle completed at bedside. No new orders given by MD. Pt's daughter made aware. Reiterated to patient not to get up OOB without assistance. Pt. agreed and laid down to rest. Staff agreed to make certain they listened for the beep when bed alarm being reset.

## 2012-04-13 NOTE — Progress Notes (Signed)
[facility] PROGRESS NOTE  David Lang ZOX:[id number] DOB: [birth year] DOA: 03/30/2012 PCP: Default, Provider, MD From prior progress note: 62 yo M with paranoid schizophrenia who presented from [facility] with fall causing left hip fracture requiring left hip arthoplasty on 3/6. His course has been complicated by his paranoid schizophrenia which has caused him to refuse medications, refuse food, refuse his brace, refuse physical therapy. He had an episode of hypoglycemia due to not eating. Ethics committee was involved to determine how much care we were able to provide, such as forcing fingersticks, blood draws, etc, however, he is now taking his medications, getting out of bed intermittently with assistance, and eating a little bit more than before. See dispo section for more information.   Assessment/Plan: Paranoid schizophrenia with confusion, paranoia and disorganized thinking. Starting to eat some.  - IVC paperwork completed  - Per psychiatry currently managing.  Refusal to eat, improving:  - Calorie count continues  - Diet currently regular. Dietitian on board - Resource Breeze TID - Appreciate nutrition assistance.  - Per EMR family encourage to bring patient food. Dietitian recommends considering peg tube placement. May not be a reasonable option given patient's continued refusal and h/o paranoid schizophrenia as he may pull at the g tube.  Hypoglycemia, likely from not eating in many days, BG low normal. Resolved  - No hypoglycemia recently reported. - Cont glucagon prn   HCAP/Hypoxemia, resolved. Completed levofloxacin 3/14.   Left subcapital femoral neck fracture s/p Left hip hemiarthroplasty, 03/31/2012.  - Reportedly per EMR patient was refusing brace. Per Ortho:  POD 12 s/p L hip hemi  WBAT, starting to mobilize better  Knee immobilizer when in bed  Staples out  CKD stage 3  - Minimize nephrotoxins  - Renally dose medications if needed.   Hypertension BP still elevated   - Continue atenolol and norvasc  - clonidine patch recently increased. - If persistently elevated tomorrow 3/20 will plan on increasing antihypertensives  Diabetes mellitus type 2  - Hemoglobin A1c 8.8 on 03/22/2012  -Continue NovoLog sliding scale   Anemia due to b12 and iron deficiency  -Repeat serum B12 as this was marginally low on 03/22/2012--> repeat B12 on 04/01/2012--276  -Continue oral B12  -continue ferrous sulfate  - Will Need outpatient GI follow up   Code Status: full Family Communication: no family at bedside. Disposition Plan: Pending further improvement in condition.   Consultants:  Psychiatry  Ortho  Procedures:  none  Antibiotics:  None  HPI/Subjective: No new complaints today.  Objective: Filed Vitals:   04/12/12 2300 04/13/12 0642 04/13/12 0654 04/13/12 0856  BP:  175/82 150/88 169/88  Pulse: 72 70  69  Temp: 97.4 F (36.3 C) 97.4 F (36.3 C)  97.3 F (36.3 C)  TempSrc: Oral Oral  Oral  Resp: 18 18  20   Height:      Weight:      SpO2: 95% 94%  95%    Intake/Output Summary (Last 24 hours) at 04/13/12 1629 Last data filed at 04/12/12 1834  Gross per 24 hour  Intake    720 ml  Output      0 ml  Net    720 ml   Filed Weights   03/31/12 1500  Weight: 80.74 kg (178 lb)    Exam:   General:  Pt laying in bed supine, Alert and Awake  Cardiovascular: RRR, No MRG  Respiratory: CTA BL, no wheezes  Abdomen: soft, NT, ND  Musculoskeletal: no cyanosis   Data  Reviewed: Basic Metabolic Panel: No results found for this basename: NA, K, CL, CO2, GLUCOSE, BUN, CREATININE, CALCIUM, MG, PHOS,  in the last 168 hours Liver Function Tests: No results found for this basename: AST, ALT, ALKPHOS, BILITOT, PROT, ALBUMIN,  in the last 168 hours No results found for this basename: LIPASE, AMYLASE,  in the last 168 hours No results found for this basename: AMMONIA,  in the last 168 hours CBC: No results found for this basename: WBC,  NEUTROABS, HGB, HCT, MCV, PLT,  in the last 168 hours Cardiac Enzymes: No results found for this basename: CKTOTAL, CKMB, CKMBINDEX, TROPONINI,  in the last 168 hours BNP (last 3 results) No results found for this basename: PROBNP,  in the last 8760 hours CBG:  Recent Labs Lab 04/12/12 1008 04/12/12 1504 04/12/12 2325 04/13/12 0734 04/13/12 1116  GLUCAP 94 92 118* 92 101*    No results found for this or any previous visit (from the past 240 hour(s)).   Studies: No results found.  Scheduled Meds: . amLODipine  10 mg Oral Daily  . ARIPiprazole  5 mg Oral Daily  . aspirin EC  325 mg Oral BID  . atenolol  100 mg Oral Daily  . calcium carbonate  1 tablet Oral Q breakfast  . clonazePAM  0.25 mg Oral BID  . [START ON 04/15/2012] cloNIDine  0.2 mg Transdermal Weekly  . docusate sodium  100 mg Oral BID  . feeding supplement  1 Container Oral TID BM  . ferrous sulfate  325 mg Oral BID WC  . haloperidol  10 mg Oral BID  . haloperidol  5 mg Oral Once  . heparin subcutaneous  5,000 Units Subcutaneous Q8H  . insulin aspart  0-9 Units Subcutaneous TID WC  . multivitamin with minerals  1 tablet Oral Daily  . OXcarbazepine  450 mg Oral BID  . pantoprazole  40 mg Oral Daily  . trihexyphenidyl  5 mg Oral BID WC  . vitamin B-12  1,000 mcg Oral Daily   Continuous Infusions:   Active Problems:   Diabetes mellitus, type 2   Anemia   Subcapital fracture of neck of femur   HCAP (healthcare-associated pneumonia)   Iron deficiency anemia    Time spent: > 35 minutes    Penny Pia  [facility] Pager [phone]. If 7PM-7AM, please contact night-coverage at [email or web address], password [facility] 04/13/2012, 4:29 PM  LOS: 14 days

## 2012-04-14 LAB — GLUCOSE, CAPILLARY: Glucose-Capillary: 117 mg/dL — ABNORMAL HIGH (ref 70–99)

## 2012-04-14 MED ORDER — BOOST / RESOURCE BREEZE PO LIQD
1.0000 | Freq: Two times a day (BID) | ORAL | Status: DC
  Administered 2012-04-16 – 2012-04-17 (×2): 1 via ORAL

## 2012-04-14 NOTE — Progress Notes (Signed)
CSW continues to follow statewide snf search and check facilities. No bed offers at this time.  Darwyn Ponzo C. Anaisabel Pederson MSW, LCSW [phone]

## 2012-04-14 NOTE — Progress Notes (Signed)
PATIENT ID: David Lang   14 Days Post-Op Procedure(s) (LRB): ARTHROPLASTY BIPOLAR HIP (Left)  Subjective: Sitting in chair, responds to simple questions. C/o some pain in L hip. No other complaints/concerns.  Objective:  Filed Vitals:   04/13/12 2141  BP: 165/83  Pulse: 61  Temp: 98.3 F (36.8 C)  Resp: 18     Well developed well nourished male in NAD Sitting comfortably in chair  L hip incision healed well, appears benign No swelling, erythema, warmth Distally grossly NVI  Labs:  No results found for this basename: HGB,  in the last 72 hoursNo results found for this basename: WBC, RBC, HCT, PLT,  in the last 72 hoursNo results found for this basename: NA, K, CL, CO2, BUN, CREATININE, GLUCOSE, CALCIUM,  in the last 72 hours  Assessment and Plan: 14 days s/p left hip hemi arthroplasty Staples removed earlier this week Cont with PT as tolerated Will follow up with Dr. [name] of [facility] in 4 weeks Script in chart for outpatient pain control and ASA  VTE proph: ECASA BID, SCDs

## 2012-04-14 NOTE — Progress Notes (Signed)
Physical Therapy Treatment Patient Details Name: David Lang MRN: [id number] DOB: Oct 15, 1950 Today's Date: 04/14/2012 Time: 4098-1191 PT Time Calculation (min): 23 min  PT Assessment / Plan / Recommendation Comments on Treatment Session  Improvement in participation level but requiring ++ encouragement     Follow Up Recommendations  SNF     Does the patient have the potential to tolerate intense rehabilitation     Barriers to Discharge        Equipment Recommendations  Rolling walker with 5" wheels    Recommendations for Other Services    Frequency Min 5X/week   Plan Discharge plan remains appropriate    Precautions / Restrictions Precautions Precautions: Posterior Hip;Fall Precaution Booklet Issued: Yes (comment) Precaution Comments: Sign in room, pt unaware of THP Restrictions Weight Bearing Restrictions: No LLE Weight Bearing: Weight bearing as tolerated Other Position/Activity Restrictions: WBAT   Pertinent Vitals/Pain My hip, I have pain when I move it - unable to rate pain    Mobility  Transfers Transfers: Sit to Stand;Stand to Sit Sit to Stand: 1: +2 Total assist;From elevated surface;With upper extremity assist;From chair/3-in-1 (pt fearful of falling, could do with +1) Sit to Stand: Patient Percentage: 80% Stand to Sit: 1: +2 Total assist;With upper extremity assist;To chair/3-in-1 Stand to Sit: Patient Percentage: 80% Details for Transfer Assistance: cues for hand placement Ambulation/Gait Ambulation/Gait Assistance: 1: +2 Total assist Ambulation/Gait: Patient Percentage: 90% Ambulation Distance (Feet): 111 Feet Assistive device: Rolling walker Ambulation/Gait Assistance Details: cues for posture, position from RW and stride length. Gait Pattern: Step-through pattern;Decreased step length - left;Decreased step length - right General Gait Details: Pt requires ++ encouragement to particpate and to ambulate more than min distance    Exercises     PT  Diagnosis:    PT Problem List:   PT Treatment Interventions:     PT Goals Acute Rehab PT Goals PT Goal Formulation: Patient unable to participate in goal setting Time For Goal Achievement: 04/26/12 Potential to Achieve Goals: Good Pt will go Supine/Side to Sit: with supervision PT Goal: Supine/Side to Sit - Progress: Progressing toward goal Pt will go Sit to Supine/Side: with supervision PT Goal: Sit to Supine/Side - Progress: Progressing toward goal Pt will go Sit to Stand: with supervision PT Goal: Sit to Stand - Progress: Progressing toward goal Pt will go Stand to Sit: with supervision PT Goal: Stand to Sit - Progress: Progressing toward goal Pt will Ambulate: 51 - 150 feet;with min assist;with rolling walker PT Goal: Ambulate - Progress: Progressing toward goal  Visit Information  Last PT Received On: 04/14/12 Assistance Needed: +2    Subjective Data  Subjective: I have no initiative to do this.  Please spare me from walking Patient Stated Goal: none stated   Cognition  Cognition Overall Cognitive Status: Impaired Area of Impairment: Memory;Safety/judgement;Awareness of errors;Awareness of deficits Arousal/Alertness: Awake/alert Orientation Level:  (knows hip hurts) Behavior During Session: [facility] for tasks performed Memory: Decreased recall of precautions Memory Deficits: Recalled that he had pain medicine.  Does not recall specifics of surgery or precautions related to same Following Commands: Follows one step commands consistently Safety/Judgement: Decreased awareness of safety precautions;Decreased safety judgement for tasks assessed Awareness of Errors: Assistance required to identify errors made;Assistance required to correct errors made Cognition - Other Comments: encouragement to participate. Not ready for THP education    Balance     End of Session PT - End of Session Equipment Utilized During Treatment: Gait belt Activity Tolerance: Patient tolerated  treatment well  Patient left: in chair;with call bell/phone within reach Nurse Communication: Mobility status   GP     Rojelio Uhrich 04/14/2012, 9:31 AM

## 2012-04-14 NOTE — Progress Notes (Signed)
NUTRITION FOLLOW UP  Intervention:   Recommend getting new wt on pt Continue Resource Breeze BID Continue Multivitamin with minerals Allow family/aid to continue bringing in outside food  Nutrition Dx:   Inadequate oral intake related to poor appetite, refusing to eat as evidenced by 0-5% meal intake; improving- pt eating food from outside hospital.   Goal:   Pt to consume >50% of meals/supplements; improving/likely being met  Monitor:   Wt; no new wt since 03/31/12 Labs; high glucose PO intake  Assessment:   No calorie count results in envelope. Pt answered "I can't answer that" to all questions regarding appetite and food intake. Per RN Maurine Minister, pt eats but pt's aid brings food from outside; pt ate 2 burritos from [employer] today for lunch and aid just arrived with food from [employer] at time of visit. Pt ate 2 donuts at time of visit. Pt has a box of a dozen [employer] doughnuts at bedside with 9 donuts gone which pt most likely ate within the past 2 days. RN states pt drank at least 480 ml of fluid today and drank one resource breeze yesterday. RN attempted to weigh pt but, bed scale was not functioning and pt refused to step on upright scale. Pt noncompliant and seems to be a picky eater.   Height: Ht Readings from Last 1 Encounters:  03/31/12 5' 9.5" (1.765 m)    Weight Status:   Wt Readings from Last 1 Encounters:  03/31/12 178 lb (80.74 kg)    Re-estimated needs:  Kcal: 1900-2050  Protein: 85-100g  Fluid: 1.9-2L/day  Skin: +1 RLE edema, +2 LLE edema, incision on left hip  Diet Order: General   Intake/Output Summary (Last 24 hours) at 04/14/12 1708 Last data filed at 04/14/12 0639  Gross per 24 hour  Intake    720 ml  Output    700 ml  Net     20 ml    Last BM: 3/15   Labs:  No results found for this basename: NA, K, CL, CO2, BUN, CREATININE, CALCIUM, MG, PHOS, GLUCOSE,  in the last 168 hours  CBG (last 3)   Recent Labs  04/13/12 2140 04/14/12 0724  04/14/12 1150  GLUCAP 141* 173* 113*    Scheduled Meds: . amLODipine  10 mg Oral Daily  . ARIPiprazole  5 mg Oral Daily  . aspirin EC  325 mg Oral BID  . atenolol  100 mg Oral Daily  . calcium carbonate  1 tablet Oral Q breakfast  . clonazePAM  0.25 mg Oral BID  . [START ON 04/15/2012] cloNIDine  0.2 mg Transdermal Weekly  . docusate sodium  100 mg Oral BID  . feeding supplement  1 Container Oral TID BM  . ferrous sulfate  325 mg Oral BID WC  . haloperidol  10 mg Oral BID  . haloperidol  5 mg Oral Once  . heparin subcutaneous  5,000 Units Subcutaneous Q8H  . insulin aspart  0-9 Units Subcutaneous TID WC  . multivitamin with minerals  1 tablet Oral Daily  . OXcarbazepine  450 mg Oral BID  . pantoprazole  40 mg Oral Daily  . trihexyphenidyl  5 mg Oral BID WC  . vitamin B-12  1,000 mcg Oral Daily    Ian Malkin RD, LDN Inpatient Clinical Dietitian Pager: [phone] After Hours Pager: [phone]

## 2012-04-14 NOTE — Progress Notes (Signed)
Clinical Social Work  Patient was served IVC paperwork by [employer] on 04/13/12. IVC valid until 04/20/12.   [place], [place] [phone]

## 2012-04-14 NOTE — Consult Note (Signed)
Patient Identification:  David Lang Date of Evaluation:  04/14/2012 Reason for Consult:  Referring Provider:   History of Present Illness: David Lang 62 yo M with paranoid schizophrenia who presented from [facility] with fall causing left hip fracture requiring left hip arthoplasty on 3/6. His course has been complicated by his paranoid schizophrenia which has caused him to refuse medications, refuse food, refuse his brace, refuse physical therapy. He had an episode of hypoglycemia due to not eating. Ethics committee was involved to determine how much care we were able to provide, such as forcing fingersticks, blood draws, etc, however, he is now taking his medications, getting out of bed intermittently with assistance, and eating a little bit more than before  Past Psychiatric History:He has a very vague history of having mental illness in [place]. He was treated in the [place] and sister tried to supervise his adherence to medication but at long distance he refused medication for the past year.    Past Medical History:     Past Medical History  Diagnosis Date  . Hypertension   . Diabetes mellitus without complication   . Mental disorder   . Schizophrenia        Past Surgical History  Procedure Laterality Date  . Hip arthroplasty Left 03/31/2012    Procedure: ARTHROPLASTY BIPOLAR HIP;  Surgeon: Mable Paris, MD;  Location: [facility];  Service: Orthopedics;  Laterality: Left;    Allergies: No Known Allergies  Current Medications:  Prior to Admission medications   Medication Sig Start Date End Date Taking? Authorizing Provider  aspirin EC 325 MG tablet Take 1 tablet (325 mg total) by mouth 2 (two) times daily. 03/31/12   Jiles Harold, PA-C  oxyCODONE-acetaminophen (ROXICET) 5-325 MG per tablet Take 1-2 tablets by mouth every 4 (four) hours as needed for pain. 03/31/12   Jiles Harold, PA-C    Social History:    reports that he has never smoked. He has never used smokeless  tobacco. He reports that he does not drink alcohol or use illicit drugs.   Family History:    History reviewed. No pertinent family history.  Mental Status Examination/Evaluation: Objective:  Appearance: Casual and undernourished  Eye Contact::  Good  Speech:  Clear and Coherent, Slow and VERY loud  Volume:  Increased  Mood:    Affect:  Congruent  Thought Process:  uncooperative with meals, beverage, PT  Orientation:  Other:  Pt is paranoid and does not have capacity  Thought Content:  Paranoid Ideation  Suicidal Thoughts:  No  Homicidal Thoughts:  No  Judgement:  Impaired  Insight:  Lacking   DIAGNOSIS:   AXIS I   Schizophrenia, paranoid   AXIS II  Deferred  AXIS III See medical notes.  AXIS IV housing problems, other psychosocial or environmental problems, problems related to social environment, problems with primary support group and family lives too far away; POA in a legal tedious process,   AXIS V 31-40 impairment in reality testing   Assessment/Plan:  Discussed with Dr. Cena Benton Pt has box of donuts on tray - offers to staff.  He is encouraged to keep them for his own.  He is cooperating to a minimal degrees.  He has a loud raspy voice, apparently unable to modulate volume    He continues to need encouragement  To cooperate with PT and eating. RECOMMENDATION:  1.  Pt does not have capacity.  Continue IVC 2.  POA pending 3.  Need to encourage intake, offer snacks. 4.  Will follow David Schneck MD 04/14/2012 7:03 PM

## 2012-04-14 NOTE — Progress Notes (Signed)
Occupational Therapy Treatment Patient Details Name: David Lang MRN: [id number] DOB: 02-24-1950 Today's Date: 04/14/2012 Time: 7829-5621 OT Time Calculation (min): 20 min  OT Assessment / Plan / Recommendation Comments on Treatment Session Pt participated in OT/PT.  Pt is not ready for  THP education.      Follow Up Recommendations  SNF    Barriers to Discharge       Equipment Recommendations  3 in 1 bedside comode    Recommendations for Other Services    Frequency Min 2X/week   Plan Discharge plan remains appropriate    Precautions / Restrictions Precautions Precautions: Posterior Hip;Fall Restrictions LLE Weight Bearing: Weight bearing as tolerated   Pertinent Vitals/Pain C/o pain in R hip in general--did not increase with weightbearing.  Premedicated and repositioned    ADL  Toilet Transfer: Simulated;Minimal assistance (+2 for safety (pt 80%)) Transfers/Ambulation Related to ADLs: +2 for safety:  pt nervous about falling.  Pt was up in chair already.  worked on sit to stand and ambulation.   R foot with edema    OT Diagnosis:    OT Problem List:   OT Treatment Interventions:     OT Goals Acute Rehab OT Goals Time For Goal Achievement: 04/17/12 ADL Goals Pt Will Transfer to Toilet: with min assist;Stand pivot transfer;Ambulation;3-in-1;Maintaining hip precautions ADL Goal: Toilet Transfer - Progress: Progressing toward goals Miscellaneous OT Goals Miscellaneous OT Goal #2: Pt will be mIn A sit to stand from bed for transfers OT Goal: Miscellaneous Goal #2 - Progress: Progressing toward goals  Visit Information  Last OT Received On: 04/14/12 Assistance Needed: +2    Subjective Data      Prior Functioning       Cognition  Cognition Overall Cognitive Status: Impaired Area of Impairment: Memory;Safety/judgement;Awareness of errors;Awareness of deficits Arousal/Alertness: Awake/alert Orientation Level:  (knows hip hurts) Behavior During Session: [facility] for  tasks performed Memory: Decreased recall of precautions Following Commands: Follows one step commands consistently Safety/Judgement: Decreased awareness of safety precautions;Decreased safety judgement for tasks assessed Awareness of Errors: Assistance required to identify errors made;Assistance required to correct errors made Cognition - Other Comments: encouragement to participate. Not ready for THP education    Mobility  Transfers Sit to Stand: 1: +2 Total assist;From elevated surface;With upper extremity assist;From chair/3-in-1 (pt fearful of falling, could do with +1) Sit to Stand: Patient Percentage: 80% Details for Transfer Assistance: cues for hand placement    Exercises      Balance     End of Session OT - End of Session Activity Tolerance: Patient limited by fatigue Patient left: in chair;with call bell/phone within reach  GO     [facility] 04/14/2012, 9:29 AM Marica Otter, OTR/L [phone] 04/14/2012

## 2012-04-14 NOTE — Progress Notes (Signed)
[facility] PROGRESS NOTE  Ryott Rafferty ZOX:[id number] DOB: [birth year] DOA: 03/30/2012 PCP: Default, Provider, MD From prior progress note: 62 yo M with paranoid schizophrenia who presented from [facility] with fall causing left hip fracture requiring left hip arthoplasty on 3/6. His course has been complicated by his paranoid schizophrenia which has caused him to refuse medications, refuse food, refuse his brace, refuse physical therapy. He had an episode of hypoglycemia due to not eating. Ethics committee was involved to determine how much care we were able to provide, such as forcing fingersticks, blood draws, etc, however, he is now taking his medications, getting out of bed intermittently with assistance, and eating a little bit more than before. See dispo section for more information.   Assessment/Plan: Paranoid schizophrenia with confusion, paranoia and disorganized thinking. Starting to eat some.  - IVC paperwork completed  - Per psychiatry currently managing.   Refusal to eat, improving:  - Calorie count continues  - Diet currently regular. Dietitian on board - [employer] TID - Appreciate nutrition assistance.  - Per EMR family encourage to bring patient food. Dietitian recommends considering peg tube placement. May not be a reasonable option given patient's continued refusal and h/o paranoid schizophrenia as he may pull at the g tube. - Patient po intake improving.  Hypoglycemia, likely from not eating in many days, BG low normal. Resolved  - No hypoglycemia recently reported. Has resolved. - Cont glucagon prn   HCAP/Hypoxemia, resolved. Completed levofloxacin 3/14.   Left subcapital femoral neck fracture s/p Left hip hemiarthroplasty, 03/31/2012.  - Reportedly per EMR patient has been refusing brace. Per Ortho:  POD 12 s/p L hip hemi  WBAT, starting to mobilize better  Knee immobilizer when in bed  Staples out  CKD stage 3  - Minimize nephrotoxins  - Renally dose  medications if needed.   Hypertension BP still elevated  - Continue atenolol and norvasc  - clonidine patch recently increased. - If persistently elevated tomorrow 3/20 will plan on increasing antihypertensives  Diabetes mellitus type 2  - Hemoglobin A1c 8.8 on 03/22/2012  -Continue NovoLog sliding scale   Anemia due to b12 and iron deficiency  -Repeat serum B12 as this was marginally low on 03/22/2012--> repeat B12 on 04/01/2012--276  -Continue oral B12  -continue ferrous sulfate  - May Need outpatient GI follow up   Code Status: full Family Communication: no family at bedside. Disposition Plan: Pending further improvement in condition.   Consultants:  Psychiatry  Ortho  Procedures:  none  Antibiotics:  None  HPI/Subjective: No new complaints today.  No acute issues reported overnight.  Objective: Filed Vitals:   04/13/12 0654 04/13/12 0856 04/13/12 2141 04/14/12 0638  BP: 150/88 169/88 165/83   Pulse:  69 61   Temp:  97.3 F (36.3 C) 98.3 F (36.8 C)   TempSrc:  Oral Oral Oral  Resp:  20 18   Height:      Weight:      SpO2:  95% 96%     Intake/Output Summary (Last 24 hours) at 04/14/12 1440 Last data filed at 04/14/12 8119  Gross per 24 hour  Intake    720 ml  Output   1000 ml  Net   -280 ml   Filed Weights   03/31/12 1500  Weight: 80.74 kg (178 lb)    Exam:   General:  Pt laying in bed supine, Alert and Awake  Cardiovascular: RRR, No MRG  Respiratory: CTA BL, no wheezes  Abdomen: soft, NT,  ND  Musculoskeletal: no cyanosis, no brace in place.  Data Reviewed: Basic Metabolic Panel: No results found for this basename: NA, K, CL, CO2, GLUCOSE, BUN, CREATININE, CALCIUM, MG, PHOS,  in the last 168 hours Liver Function Tests: No results found for this basename: AST, ALT, ALKPHOS, BILITOT, PROT, ALBUMIN,  in the last 168 hours No results found for this basename: LIPASE, AMYLASE,  in the last 168 hours No results found for this basename:  AMMONIA,  in the last 168 hours CBC: No results found for this basename: WBC, NEUTROABS, HGB, HCT, MCV, PLT,  in the last 168 hours Cardiac Enzymes: No results found for this basename: CKTOTAL, CKMB, CKMBINDEX, TROPONINI,  in the last 168 hours BNP (last 3 results) No results found for this basename: PROBNP,  in the last 8760 hours CBG:  Recent Labs Lab 04/13/12 1116 04/13/12 1614 04/13/12 2140 04/14/12 0724 04/14/12 1150  GLUCAP 101* 103* 141* 173* 113*    No results found for this or any previous visit (from the past 240 hour(s)).   Studies: No results found.  Scheduled Meds: . amLODipine  10 mg Oral Daily  . ARIPiprazole  5 mg Oral Daily  . aspirin EC  325 mg Oral BID  . atenolol  100 mg Oral Daily  . calcium carbonate  1 tablet Oral Q breakfast  . clonazePAM  0.25 mg Oral BID  . [START ON 04/15/2012] cloNIDine  0.2 mg Transdermal Weekly  . docusate sodium  100 mg Oral BID  . feeding supplement  1 Container Oral TID BM  . ferrous sulfate  325 mg Oral BID WC  . haloperidol  10 mg Oral BID  . haloperidol  5 mg Oral Once  . heparin subcutaneous  5,000 Units Subcutaneous Q8H  . insulin aspart  0-9 Units Subcutaneous TID WC  . multivitamin with minerals  1 tablet Oral Daily  . OXcarbazepine  450 mg Oral BID  . pantoprazole  40 mg Oral Daily  . trihexyphenidyl  5 mg Oral BID WC  . vitamin B-12  1,000 mcg Oral Daily   Continuous Infusions:   Active Problems:   Diabetes mellitus, type 2   Anemia   Subcapital fracture of neck of femur   HCAP (healthcare-associated pneumonia)   Iron deficiency anemia    Time spent: > 35 minutes    Penny Pia  [facility] Pager [phone]. If 7PM-7AM, please contact night-coverage at [email or web address], password [facility] 04/14/2012, 2:40 PM  LOS: 15 days

## 2012-04-15 LAB — GLUCOSE, CAPILLARY
Glucose-Capillary: 123 mg/dL — ABNORMAL HIGH (ref 70–99)
Glucose-Capillary: 150 mg/dL — ABNORMAL HIGH (ref 70–99)
Glucose-Capillary: 106 mg/dL — ABNORMAL HIGH (ref 70–99)
Glucose-Capillary: 144 mg/dL — ABNORMAL HIGH (ref 70–99)

## 2012-04-15 LAB — BASIC METABOLIC PANEL
CO2: 27 mEq/L (ref 19–32)
Calcium: 8.6 mg/dL (ref 8.4–10.5)
Chloride: 102 mEq/L (ref 96–112)
Glucose, Bld: 130 mg/dL — ABNORMAL HIGH (ref 70–99)
Sodium: 138 mEq/L (ref 135–145)

## 2012-04-15 MED ORDER — CLONIDINE HCL 0.3 MG/24HR TD PTWK
0.3000 mg | MEDICATED_PATCH | TRANSDERMAL | Status: DC
  Administered 2012-04-15: 0.3 mg via TRANSDERMAL
  Filled 2012-04-15: qty 1

## 2012-04-15 NOTE — Progress Notes (Signed)
CSW has received a bed offer from [facility] in [place]. Patient can be transferred there on Monday. Jayceion Lisenby C. Tyan Lasure MSW, LCSW [phone]

## 2012-04-15 NOTE — Progress Notes (Signed)
Physical Therapy Treatment Patient Details Name: David Lang MRN: [id number] DOB: 10/18/1950 Today's Date: 04/15/2012 Time: 4098-1191 PT Time Calculation (min): 15 min  PT Assessment / Plan / Recommendation Comments on Treatment Session  Pt agreeable to bed ther ex and intermittantly participating.  OOB deferrred 2* pt lethargy.  Will follow up with increased level of alertness    Follow Up Recommendations  SNF     Does the patient have the potential to tolerate intense rehabilitation     Barriers to Discharge        Equipment Recommendations  Rolling walker with 5" wheels    Recommendations for Other Services    Frequency Min 5X/week   Plan Discharge plan remains appropriate    Precautions / Restrictions Precautions Precautions: Posterior Hip;Fall Precaution Comments: Sign in room, pt unaware of THP Restrictions Weight Bearing Restrictions: No LLE Weight Bearing: Weight bearing as tolerated Other Position/Activity Restrictions: WBAT   Pertinent Vitals/Pain No c/o pain     Mobility  Bed Mobility Bed Mobility: Not assessed Transfers Transfers: Not assessed Ambulation/Gait Ambulation/Gait Assistance: Not tested (comment)    Exercises Total Joint Exercises Ankle Circles/Pumps: AAROM;15 reps;Supine;Both Heel Slides: AAROM;Supine;Left;15 reps Hip ABduction/ADduction: AAROM;Left;Supine;15 reps   PT Diagnosis:    PT Problem List:   PT Treatment Interventions:     PT Goals Acute Rehab PT Goals PT Goal Formulation: Patient unable to participate in goal setting Time For Goal Achievement: 04/26/12 Potential to Achieve Goals: Good Pt will go Supine/Side to Sit: with supervision  Visit Information  Last PT Received On: 04/15/12 Assistance Needed: +2    Subjective Data  Subjective: If you are going to do me in, do not make it painful.   Patient Stated Goal: none stated   Cognition  Cognition Overall Cognitive Status: Impaired Area of Impairment:  Memory;Safety/judgement;Awareness of errors;Awareness of deficits Arousal/Alertness: Lethargic Orientation Level: Disoriented to;Place;Time;Situation Behavior During Session: WFL for tasks performed Memory: Decreased recall of precautions Memory Deficits: Recalled that he had pain medicine.  Does not recall specifics of surgery or precautions related to same Following Commands: Follows one step commands consistently Safety/Judgement: Decreased awareness of safety precautions;Decreased safety judgement for tasks assessed Awareness of Errors: Assistance required to identify errors made;Assistance required to correct errors made Cognition - Other Comments: encouragement to participate. Not ready for THP education    Balance     End of Session PT - End of Session Activity Tolerance: Patient limited by fatigue Patient left: in bed;with call bell/phone within reach Nurse Communication: Mobility status   GP     David Lang 04/15/2012, 9:35 AM

## 2012-04-15 NOTE — Progress Notes (Signed)
PT NOTE:  PT attempted this pm.  Pt in bed and unwilling to participate.  Pt stating repeatedly that he is "dealing with the forces that are trying to destroy me"  And that he need time to ". "think about it" .  Will follow.

## 2012-04-15 NOTE — Consult Note (Signed)
Patient Identification:  David Lang Date of Evaluation:  04/15/2012 Reason for Consult: Schizophrenia, paranoid; capacity  Referring Provider:  Dr. Dorena Dew Bergeron 62 yo M with paranoid schizophrenia who presented from [facility] with fall causing left hip fracture requiring left hip arthoplasty on 3/6. His course has been complicated by his paranoid schizophrenia which has caused him to refuse medications, refuse food, refuse his brace, refuse physical therapy. He had an episode of hypoglycemia due to not eating. Ethics committee was involved to determine how much care we were able to provide, such as forcing fingersticks, blood draws, etc, however, he is now taking his medications, getting out of bed intermittently with assistance, and eating a little bit more than before   Past Psychiatric History:He has a very vague history of having mental illness in [place]. He was treated in the [place] and sister tried to supervise his adherence to medication but at long distance he refused medication for the past year.   Past Medical History:     Past Medical History  Diagnosis Date  . Hypertension   . Diabetes mellitus without complication   . Mental disorder   . Schizophrenia        Past Surgical History  Procedure Laterality Date  . Hip arthroplasty Left 03/31/2012    Procedure: ARTHROPLASTY BIPOLAR HIP;  Surgeon: Mable Paris, MD;  Location: [facility];  Service: Orthopedics;  Laterality: Left;    Allergies: No Known Allergies  Current Medications:  Prior to Admission medications   Medication Sig Start Date End Date Taking? Authorizing Provider  aspirin EC 325 MG tablet Take 1 tablet (325 mg total) by mouth 2 (two) times daily. 03/31/12   Jiles Harold, PA-C  oxyCODONE-acetaminophen (ROXICET) 5-325 MG per tablet Take 1-2 tablets by mouth every 4 (four) hours as needed for pain. 03/31/12   Jiles Harold, PA-C    Social History:    reports that he has never smoked. He has never used  smokeless tobacco. He reports that he does not drink alcohol or use illicit drugs.   Family History:    History reviewed. No pertinent family history.  Mental Status Examination/Evaluation: Objective:  Appearance: Casual and hair and beard trimmed, undernourished  Eye Contact::  Good  Speech:  Clear and Coherent, Garbled and very loud  Volume:  Increased  Mood:  Indifferent to requests or instructions  Affect:  Congruent  Thought Process:  Disorganized and self-directed  Orientation:  Other:  oriented to self , needs and paranoid ideas  Thought Content:  Paranoid Ideation  Suicidal Thoughts:  No  Homicidal Thoughts:  No  Judgement:  Poor  Insight:  Lacking   DIAGNOSIS:   AXIS I  Schizophrenia, paranoid type  AXIS II  Deferred  AXIS III See medical notes.  AXIS IV economic problems, housing problems, other psychosocial or environmental problems, problems related to social environment, problems with access to health care services and Pt has disregarded safety instructions and it seems difficult to communicate these precautions when he is so opinionated by own constructs.  AXIS V 31-40 impairment in reality testing   Assessment/Plan:   Discussed with Dr. Helmut Muster head Nurse. Pt is managing to get to [facility] on his own but not without a fall.  He finally ate food last night  But ate too much all at once after day[s] of no food.  He became nauseous.  PRECAUTION:  Small snacks often  Pt's sister may request an emergency POA that will serve until the  permanent one is issued.  - need to call sister in [place].  RECOMMENDATION:  1.  Continue IVC, Pt does not have capacity 2.  Encourage short, more frequent PT sessions to strenthen muscles, stability and mtility 3.  Suggest small nutritious snacks throughtout the day in lieu of a regular meal. CONTROL volume eaten at one time.  4.  Will follow patient.  Mickeal Skinner MD 04/15/2012 6:56 PM

## 2012-04-15 NOTE — Progress Notes (Signed)
[facility] PROGRESS NOTE  David Lang ZOX:[id number] DOB: 10-17-1950 DOA: 03/30/2012 PCP: Default, Provider, MD From prior progress note: 62 yo M with paranoid schizophrenia who presented from [facility] with fall causing left hip fracture requiring left hip arthoplasty on 3/6. His course has been complicated by his paranoid schizophrenia which has caused him to refuse medications, refuse food, refuse his brace, refuse physical therapy. He had an episode of hypoglycemia due to not eating. Ethics committee was involved to determine how much care we were able to provide, such as forcing fingersticks, blood draws, etc, however, he is now taking his medications, getting out of bed intermittently with assistance, and eating a little bit more than before. See dispo section for more information.   Assessment/Plan: Paranoid schizophrenia with confusion, paranoia and disorganized thinking. Starting to eat some.  - IVC paperwork completed  - Per psychiatry currently managing.   Refusal to eat, improving:  - Calorie count continues  - Diet currently regular. Dietitian on board - [employer] TID - Appreciate nutrition assistance.  - Per EMR family encourage to bring patient food. Dietitian recommends considering peg tube placement. May not be a reasonable option given patient's continued refusal and h/o paranoid schizophrenia as he may pull at the g tube. - Patient po intake improving.  Hypoglycemia, likely from not eating in many days, BG low normal. Resolved  - No hypoglycemia recently reported. Has resolved. - Cont glucagon prn   HCAP/Hypoxemia, resolved. Completed levofloxacin 3/14.   Left subcapital femoral neck fracture s/p Left hip hemiarthroplasty, 03/31/2012.  - Reportedly per EMR patient has been refusing brace. Per Ortho:  POD 12 s/p L hip hemi  WBAT, starting to mobilize better  Knee immobilizer when in bed  Staples out - Awaiting placement into facility  CKD stage 3  - Minimize  nephrotoxins  - Renally dose medications if needed.  - creatinine at baseline 1.39  Hypertension BP still elevated  - Continue atenolol and norvasc  - clonidine increased to 0.3 mg  - If persistently elevated tomorrow 3/20 will plan on increasing antihypertensives  Diabetes mellitus type 2  - Hemoglobin A1c 8.8 on 03/22/2012  -Continue NovoLog sliding scale   Anemia due to b12 and iron deficiency  -Repeat serum B12 as this was marginally low on 03/22/2012--> repeat B12 on 04/01/2012--276  -Continue oral B12  -continue ferrous sulfate  - May Need outpatient GI follow up   Code Status: full Family Communication: no family at bedside. Disposition Plan: Pending further improvement in condition.   Consultants:  Psychiatry  Ortho  Procedures:  none  Antibiotics:  None  HPI/Subjective: No new complaints today.  No acute issues reported overnight.   Objective: Filed Vitals:   04/14/12 0638 04/14/12 2200 04/15/12 0600 04/15/12 0634  BP:  170/85 173/77 162/78  Pulse:  83 93   Temp:  98.2 F (36.8 C) 98.1 F (36.7 C)   TempSrc: Oral Oral Oral   Resp:  18 18   Height:      Weight:      SpO2:  94% 90%     Intake/Output Summary (Last 24 hours) at 04/15/12 1301 Last data filed at 04/15/12 0600  Gross per 24 hour  Intake      0 ml  Output    625 ml  Net   -625 ml   Filed Weights   03/31/12 1500  Weight: 80.74 kg (178 lb)    Exam:   General:  Pt laying in bed supine, Alert and  Awake  Cardiovascular: RRR, No MRG  Respiratory: CTA BL, no wheezes  Abdomen: soft, NT, ND  Musculoskeletal: no cyanosis, no brace in place.  Data Reviewed: Basic Metabolic Panel:  Recent Labs Lab 04/15/12 0438  NA 138  K 4.0  CL 102  CO2 27  GLUCOSE 130*  BUN 17  CREATININE 1.39*  CALCIUM 8.6   Liver Function Tests: No results found for this basename: AST, ALT, ALKPHOS, BILITOT, PROT, ALBUMIN,  in the last 168 hours No results found for this basename: LIPASE,  AMYLASE,  in the last 168 hours No results found for this basename: AMMONIA,  in the last 168 hours CBC: No results found for this basename: WBC, NEUTROABS, HGB, HCT, MCV, PLT,  in the last 168 hours Cardiac Enzymes: No results found for this basename: CKTOTAL, CKMB, CKMBINDEX, TROPONINI,  in the last 168 hours BNP (last 3 results) No results found for this basename: PROBNP,  in the last 8760 hours CBG:  Recent Labs Lab 04/14/12 0724 04/14/12 1150 04/14/12 2129 04/15/12 0741 04/15/12 1147  GLUCAP 173* 113* 117* 123* 150*    No results found for this or any previous visit (from the past 240 hour(s)).   Studies: No results found.  Scheduled Meds: . amLODipine  10 mg Oral Daily  . ARIPiprazole  5 mg Oral Daily  . aspirin EC  325 mg Oral BID  . atenolol  100 mg Oral Daily  . calcium carbonate  1 tablet Oral Q breakfast  . clonazePAM  0.25 mg Oral BID  . cloNIDine  0.2 mg Transdermal Weekly  . docusate sodium  100 mg Oral BID  . feeding supplement  1 Container Oral BID BM  . ferrous sulfate  325 mg Oral BID WC  . haloperidol  10 mg Oral BID  . haloperidol  5 mg Oral Once  . heparin subcutaneous  5,000 Units Subcutaneous Q8H  . insulin aspart  0-9 Units Subcutaneous TID WC  . multivitamin with minerals  1 tablet Oral Daily  . OXcarbazepine  450 mg Oral BID  . pantoprazole  40 mg Oral Daily  . trihexyphenidyl  5 mg Oral BID WC  . vitamin B-12  1,000 mcg Oral Daily   Continuous Infusions:   Active Problems:   Diabetes mellitus, type 2   Anemia   Subcapital fracture of neck of femur   HCAP (healthcare-associated pneumonia)   Iron deficiency anemia    Time spent: > 35 minutes    Penny Pia  [facility] Pager [phone]. If 7PM-7AM, please contact night-coverage at [email or web address], password [facility] 04/15/2012, 1:01 PM  LOS: 16 days

## 2012-04-16 LAB — GLUCOSE, CAPILLARY: Glucose-Capillary: 118 mg/dL — ABNORMAL HIGH (ref 70–99)

## 2012-04-16 NOTE — Progress Notes (Signed)
[facility] PROGRESS NOTE  David Lang RUE:[id number] DOB: 03-29-1950 DOA: 03/30/2012 PCP: Default, Provider, MD From prior progress note: 62 yo M with paranoid schizophrenia who presented from [facility] with fall causing left hip fracture requiring left hip arthoplasty on 3/6. His course has been complicated by his paranoid schizophrenia which has caused him to refuse medications, refuse food, refuse his brace, refuse physical therapy. He had an episode of hypoglycemia due to not eating. Ethics committee was involved to determine how much care we were able to provide, such as forcing fingersticks, blood draws, etc, however, he is now taking his medications, getting out of bed intermittently with assistance, and eating a little bit more than before. See dispo section for more information.   Assessment/Plan: Paranoid schizophrenia with confusion, paranoia and disorganized thinking. Starting to eat some.  - IVC paperwork completed  - Per psychiatry currently managing.   Refusal to eat, improving:  - Calorie count continues  - Diet currently regular. Dietitian on board - [employer] TID - Appreciate nutrition assistance.  - Per EMR family encourage to bring patient food. Dietitian recommends considering peg tube placement. May not be a reasonable option given patient's continued refusal and h/o paranoid schizophrenia as he may pull at the g tube. - Patient po intake improving and family bringing patient food to room.  Hypoglycemia, likely from not eating in many days, BG low normal. Resolved  - No hypoglycemia recently reported. Has resolved. - Cont glucagon prn   HCAP/Hypoxemia, resolved. Completed levofloxacin 3/14.   Left subcapital femoral neck fracture s/p Left hip hemiarthroplasty, 03/31/2012.  - Reportedly per EMR patient has been refusing brace. Per Ortho:  POD 12 s/p L hip hemi  WBAT, starting to mobilize better  Knee immobilizer when in bed  Staples out - Awaiting placement  into facility likely Monday  CKD stage 3  - Minimize nephrotoxins  - Renally dose medications if needed. 3/24 - creatinine at baseline 1.39  Hypertension BP still elevated  - Continue atenolol and norvasc  - clonidine increased to 0.3 mg  - If persistently elevated tomorrow 3/20 will plan on increasing antihypertensives - Likely exacerbated due to agitation from Psychiatric condition.  Diabetes mellitus type 2  - Hemoglobin A1c 8.8 on 03/22/2012  -Continue NovoLog sliding scale   Anemia due to b12 and iron deficiency  -Repeat serum B12 as this was marginally low on 03/22/2012--> repeat B12 on 04/01/2012--276  -Continue oral B12  -continue ferrous sulfate  - May Need outpatient GI follow up   Code Status: full Family Communication: no family at bedside. Disposition Plan: Likely d/c on monday   Consultants:  Psychiatry  Ortho  Procedures:  none  Antibiotics:  None  HPI/Subjective: Patient was inquiring about discharge planning.  I have indicated that he will likely be discharged to facility on Monday.  He became upset and wanted to be discharged home. No acute issues overnight  Objective: Filed Vitals:   04/15/12 0600 04/15/12 0634 04/15/12 1459 04/15/12 2210  BP: 173/77 162/78 166/83 172/86  Pulse: 93  70 74  Temp: 98.1 F (36.7 C)  97.6 F (36.4 C) 98.2 F (36.8 C)  TempSrc: Oral  Oral Oral  Resp: 18  16 18   Height:      Weight:      SpO2: 90%  96% 97%   No intake or output data in the 24 hours ending 04/16/12 1443 Filed Weights   03/31/12 1500  Weight: 80.74 kg (178 lb)    Exam:  General:  Pt laying in bed supine, Alert and Awake  Cardiovascular: extremities pink  Respiratory: CTA BL, no wheezes  Abdomen: soft, NT, ND  Musculoskeletal: no cyanosis, no brace in place.  Data Reviewed: Basic Metabolic Panel:  Recent Labs Lab 04/15/12 0438  NA 138  K 4.0  CL 102  CO2 27  GLUCOSE 130*  BUN 17  CREATININE 1.39*  CALCIUM 8.6    Liver Function Tests: No results found for this basename: AST, ALT, ALKPHOS, BILITOT, PROT, ALBUMIN,  in the last 168 hours No results found for this basename: LIPASE, AMYLASE,  in the last 168 hours No results found for this basename: AMMONIA,  in the last 168 hours CBC: No results found for this basename: WBC, NEUTROABS, HGB, HCT, MCV, PLT,  in the last 168 hours Cardiac Enzymes: No results found for this basename: CKTOTAL, CKMB, CKMBINDEX, TROPONINI,  in the last 168 hours BNP (last 3 results) No results found for this basename: PROBNP,  in the last 8760 hours CBG:  Recent Labs Lab 04/15/12 1147 04/15/12 1703 04/15/12 2202 04/16/12 0835 04/16/12 1227  GLUCAP 150* 106* 144* 92 118*    No results found for this or any previous visit (from the past 240 hour(s)).   Studies: No results found.  Scheduled Meds: . amLODipine  10 mg Oral Daily  . ARIPiprazole  5 mg Oral Daily  . aspirin EC  325 mg Oral BID  . atenolol  100 mg Oral Daily  . calcium carbonate  1 tablet Oral Q breakfast  . clonazePAM  0.25 mg Oral BID  . cloNIDine  0.3 mg Transdermal Weekly  . docusate sodium  100 mg Oral BID  . feeding supplement  1 Container Oral BID BM  . ferrous sulfate  325 mg Oral BID WC  . haloperidol  10 mg Oral BID  . haloperidol  5 mg Oral Once  . heparin subcutaneous  5,000 Units Subcutaneous Q8H  . insulin aspart  0-9 Units Subcutaneous TID WC  . multivitamin with minerals  1 tablet Oral Daily  . OXcarbazepine  450 mg Oral BID  . pantoprazole  40 mg Oral Daily  . trihexyphenidyl  5 mg Oral BID WC  . vitamin B-12  1,000 mcg Oral Daily   Continuous Infusions:   Active Problems:   Diabetes mellitus, type 2   Anemia   Subcapital fracture of neck of femur   HCAP (healthcare-associated pneumonia)   Iron deficiency anemia    Time spent: > 35 minutes    Penny Pia  [facility] Pager [phone]. If 7PM-7AM, please contact night-coverage at [email or web address], password  [facility] 04/16/2012, 2:43 PM  LOS: 17 days

## 2012-04-17 LAB — GLUCOSE, CAPILLARY: Glucose-Capillary: 88 mg/dL (ref 70–99)

## 2012-04-17 MED ORDER — HYDRALAZINE HCL 25 MG PO TABS
25.0000 mg | ORAL_TABLET | Freq: Two times a day (BID) | ORAL | Status: DC
  Administered 2012-04-17: 25 mg via ORAL
  Filled 2012-04-17 (×3): qty 1

## 2012-04-17 NOTE — Progress Notes (Signed)
[facility] PROGRESS NOTE  Alik Mawson YQM:[id number] DOB: 08-17-1950 DOA: 03/30/2012 PCP: Default, Provider, MD From prior progress note: 62 yo M with paranoid schizophrenia who presented from [facility] with fall causing left hip fracture requiring left hip arthoplasty on 3/6. His course has been complicated by his paranoid schizophrenia which has caused him to refuse medications, refuse food, refuse his brace, refuse physical therapy. He had an episode of hypoglycemia due to not eating. Ethics committee was involved to determine how much care we were able to provide, such as forcing fingersticks, blood draws, etc, however, he is now taking his medications, getting out of bed intermittently with assistance, and eating a little bit more than before. See dispo section for more information.   Assessment/Plan: Paranoid schizophrenia with confusion, paranoia and disorganized thinking. Starting to eat some.  - IVC paperwork completed  - Per psychiatry currently managing.   Hypertension, Uncontrolled - Continue atenolol, norvasc and increased clonidine  - Likely exacerbated due to agitation from Psychiatric condition. -Add hydralazine, continue psych meds for agitation  Poor PO Intake improving:  - Calorie count continues  - Diet currently regular. Dietitian following - [employer] TID - Appreciate nutrition assistance.  - Per EMR family encourage to bring patient food. Dietitian recommends considering peg tube placement. May not be a reasonable option given patient's continued refusal and h/o paranoid schizophrenia as he may pull at the g tube. - Patient po intake improving and family bringing patient food to room.  Hypoglycemia, likely from not eating in many days, BG low normal. Resolved  - No hypoglycemia recently reported. Has resolved. - Cont glucagon prn   HCAP/Hypoxemia, resolved. Completed levofloxacin 3/14.   Left subcapital femoral neck fracture s/p Left hip hemiarthroplasty,  03/31/2012.  - Reportedly per EMR patient has been refusing brace. Per Ortho:  POD 12 s/p L hip hemi  WBAT, starting to mobilize better  Knee immobilizer when in bed  Staples out - Awaiting placement into facility likely Monday  CKD stage 3  - Minimize nephrotoxins  - Renally dose medications if needed. 3/24 - creatinine at baseline 1.39 on 3/21  Diabetes mellitus type 2  - Hemoglobin A1c 8.8 on 03/22/2012  -Continue NovoLog sliding scale   Anemia due to b12 and iron deficiency  -Repeat serum B12 as this was marginally low on 03/22/2012--> repeat B12 on 04/01/2012--276  -Continue oral B12  -continue ferrous sulfate  - May Need outpatient GI follow up   Code Status: full Family Communication: no family at bedside. Disposition Plan: Likely d/c on monday   Consultants:  Psychiatry  Ortho  Procedures:  none  Antibiotics:  None  HPI/Subjective: Patient alert, answers some simple questions appropriately, still with disorganized thought/flight of ideas Objective: Filed Vitals:   04/16/12 1531 04/16/12 2035 04/16/12 2210 04/17/12 0629  BP: 168/76 169/85 164/77 165/84  Pulse: 72 72 79 71  Temp: 97.9 F (36.6 C) 98.1 F (36.7 C) 97.9 F (36.6 C) 97.8 F (36.6 C)  TempSrc: Oral Oral Oral Axillary  Resp: 18 20 20 18   Height:      Weight:      SpO2: 98% 96% 98% 95%    Intake/Output Summary (Last 24 hours) at 04/17/12 1039 Last data filed at 04/17/12 0630  Gross per 24 hour  Intake    720 ml  Output      0 ml  Net    720 ml   Filed Weights   03/31/12 1500  Weight: 80.74 kg (178 lb)  Exam:   General:  Pt laying in bed supine, Alert and Awake  Cardiovascular: RRR, nl S1S2  Respiratory: CTA BL, no wheezes  Abdomen: soft, NT, ND  Musculoskeletal: no cyanosis, no brace in place.  Data Reviewed: Basic Metabolic Panel:  Recent Labs Lab 04/15/12 0438  NA 138  K 4.0  CL 102  CO2 27  GLUCOSE 130*  BUN 17  CREATININE 1.39*  CALCIUM 8.6    Liver Function Tests: No results found for this basename: AST, ALT, ALKPHOS, BILITOT, PROT, ALBUMIN,  in the last 168 hours No results found for this basename: LIPASE, AMYLASE,  in the last 168 hours No results found for this basename: AMMONIA,  in the last 168 hours CBC: No results found for this basename: WBC, NEUTROABS, HGB, HCT, MCV, PLT,  in the last 168 hours Cardiac Enzymes: No results found for this basename: CKTOTAL, CKMB, CKMBINDEX, TROPONINI,  in the last 168 hours BNP (last 3 results) No results found for this basename: PROBNP,  in the last 8760 hours CBG:  Recent Labs Lab 04/16/12 0835 04/16/12 1227 04/16/12 1749 04/16/12 2207 04/17/12 0736  GLUCAP 92 118* 177* 126* 85    No results found for this or any previous visit (from the past 240 hour(s)).   Studies: No results found.  Scheduled Meds: . amLODipine  10 mg Oral Daily  . ARIPiprazole  5 mg Oral Daily  . aspirin EC  325 mg Oral BID  . atenolol  100 mg Oral Daily  . calcium carbonate  1 tablet Oral Q breakfast  . clonazePAM  0.25 mg Oral BID  . cloNIDine  0.3 mg Transdermal Weekly  . docusate sodium  100 mg Oral BID  . feeding supplement  1 Container Oral BID BM  . ferrous sulfate  325 mg Oral BID WC  . haloperidol  10 mg Oral BID  . haloperidol  5 mg Oral Once  . heparin subcutaneous  5,000 Units Subcutaneous Q8H  . insulin aspart  0-9 Units Subcutaneous TID WC  . multivitamin with minerals  1 tablet Oral Daily  . OXcarbazepine  450 mg Oral BID  . pantoprazole  40 mg Oral Daily  . trihexyphenidyl  5 mg Oral BID WC  . vitamin B-12  1,000 mcg Oral Daily   Continuous Infusions:   Active Problems:   Diabetes mellitus, type 2   Anemia   Subcapital fracture of neck of femur   HCAP (healthcare-associated pneumonia)   Iron deficiency anemia    Time spent: 25 minutes    Oluwateniola Leitch C  [facility] [phone]. If 7PM-7AM, please contact night-coverage at [email or web address], password  [facility] 04/17/2012, 10:39 AM  LOS: 18 days

## 2012-04-18 LAB — GLUCOSE, CAPILLARY
Glucose-Capillary: 101 mg/dL — ABNORMAL HIGH (ref 70–99)
Glucose-Capillary: 115 mg/dL — ABNORMAL HIGH (ref 70–99)

## 2012-04-18 MED ORDER — CALCIUM CARBONATE 1250 (500 CA) MG PO TABS: 1.0000 | ORAL_TABLET | Freq: Every day | ORAL | Status: DC

## 2012-04-18 MED ORDER — OXCARBAZEPINE 150 MG PO TABS: 450.0000 mg | ORAL_TABLET | Freq: Two times a day (BID) | ORAL | Status: DC

## 2012-04-18 MED ORDER — ZOLPIDEM TARTRATE 5 MG PO TABS: 5.0000 mg | ORAL_TABLET | Freq: Every evening | ORAL | Status: DC | PRN

## 2012-04-18 MED ORDER — ONDANSETRON HCL 4 MG PO TABS: 4.0000 mg | ORAL_TABLET | Freq: Four times a day (QID) | ORAL | Status: DC | PRN

## 2012-04-18 MED ORDER — LORAZEPAM 2 MG PO TABS: 2.0000 mg | ORAL_TABLET | Freq: Four times a day (QID) | ORAL | Status: DC | PRN

## 2012-04-18 MED ORDER — ATENOLOL 100 MG PO TABS: 100.0000 mg | ORAL_TABLET | Freq: Every day | ORAL | Status: DC

## 2012-04-18 MED ORDER — HALOPERIDOL 10 MG PO TABS: 10.0000 mg | ORAL_TABLET | Freq: Two times a day (BID) | ORAL | Status: DC

## 2012-04-18 MED ORDER — POLYETHYLENE GLYCOL 3350 17 G PO PACK: 17.0000 g | PACK | Freq: Every day | ORAL | Status: DC | PRN

## 2012-04-18 MED ORDER — FERROUS SULFATE 325 (65 FE) MG PO TABS: 325.0000 mg | ORAL_TABLET | Freq: Two times a day (BID) | ORAL | Status: DC

## 2012-04-18 MED ORDER — TRIHEXYPHENIDYL HCL 5 MG PO TABS: 5.0000 mg | ORAL_TABLET | Freq: Two times a day (BID) | ORAL | Status: DC

## 2012-04-18 MED ORDER — CLONIDINE HCL 0.3 MG/24HR TD PTWK: 1.0000 | MEDICATED_PATCH | TRANSDERMAL | Status: DC

## 2012-04-18 MED ORDER — DSS 100 MG PO CAPS: 100.0000 mg | ORAL_CAPSULE | Freq: Two times a day (BID) | ORAL | Status: DC

## 2012-04-18 MED ORDER — HYDRALAZINE HCL 25 MG PO TABS
25.0000 mg | ORAL_TABLET | Freq: Two times a day (BID) | ORAL | Status: DC
  Administered 2012-04-18: 25 mg via ORAL
  Filled 2012-04-18 (×2): qty 1

## 2012-04-18 MED ORDER — BOOST / RESOURCE BREEZE PO LIQD: 1.0000 | Freq: Two times a day (BID) | ORAL | Status: DC

## 2012-04-18 MED ORDER — BISACODYL 5 MG PO TBEC: 5.0000 mg | DELAYED_RELEASE_TABLET | Freq: Every day | ORAL | Status: DC | PRN

## 2012-04-18 MED ORDER — ARIPIPRAZOLE 5 MG PO TABS: 5.0000 mg | ORAL_TABLET | Freq: Every day | ORAL | Status: DC

## 2012-04-18 MED ORDER — CYANOCOBALAMIN 1000 MCG PO TABS: 1000.0000 ug | ORAL_TABLET | Freq: Every day | ORAL | Status: AC

## 2012-04-18 MED ORDER — PANTOPRAZOLE SODIUM 40 MG PO TBEC: 40.0000 mg | DELAYED_RELEASE_TABLET | Freq: Every day | ORAL | Status: DC

## 2012-04-18 MED ORDER — CLONAZEPAM 0.5 MG PO TABS: 0.2500 mg | ORAL_TABLET | Freq: Two times a day (BID) | ORAL | Status: DC

## 2012-04-18 MED ORDER — HYDRALAZINE HCL 25 MG PO TABS: 25.0000 mg | ORAL_TABLET | Freq: Three times a day (TID) | ORAL | Status: DC

## 2012-04-18 MED ORDER — AMLODIPINE BESYLATE 10 MG PO TABS: 10.0000 mg | ORAL_TABLET | Freq: Every day | ORAL | Status: DC

## 2012-04-18 NOTE — Progress Notes (Signed)
Called [employer] and gave report to [place], Charity fundraiser. Left number if she had any questions.

## 2012-04-18 NOTE — Discharge Summary (Signed)
Physician Discharge Summary  David Lang ZOX:[id number] DOB: 23-Apr-1950 DOA: 03/30/2012  PCP: Default, Provider, MD  Admit date: 03/30/2012 Discharge date: 04/18/2012  Time spent: >30 minutes  Recommendations for Outpatient Follow-up:      Follow-up Information   Follow up with David Paris, MD. Schedule an appointment as soon as possible for a visit in 2 weeks.   Contact information:   [address] [place] [address] [phone]     SNF M.D. in 1to 2 days  Discharge Diagnoses:  Active Problems:   Diabetes mellitus, type 2   Anemia   Subcapital fracture of neck of femur   HCAP (healthcare-associated pneumonia)   Iron deficiency anemia   Discharge Condition: Improved/stable  Diet recommendation: Regular  Filed Weights   03/31/12 1500  Weight: 80.74 kg (178 lb)    History of present illness:  Who was admitted at [facility] by the teaching service on 2/1 for AKI. He was then transferred to [facility] for schizophrenia. Per chart review: He was apparently being treated by his sister, David Lang, who is an MD- pediatrician who does not live in [place], with Haldol 10mg  qam and 20mg  qhs. Per nursing, in discussing the pt with his sister, she states that he was diagnosed with a mood disorder as a teenager in [place] and was hospitalized a few times there. He moved to the [place] in the 80s and was hospitalized at [facility] in 2010 and was placed on the Haldol at that time. Per pt's pharmacy (number provided by the sister), David Lang has not had any medications filled since January, 2013.  Patient was transferred to the ER at [facility] after patient fell from his wheelchair at [facility]. Patient fell forward out of his wheelchair. He sustained a laceration to his left lateral eyebrow. Patient is complaining of left hip pain. Patient has history of schizophrenia, and is a poor historian. He reports he fell "due to the weapons of the mind".  In the ER, he was found to have a  broken hip. He was seen by Dr. [name] who states the patient needs a hemi hip. Patient was deemed by psych not to have capacity to make medical decisions- he was initially against surgery but is now agreeable. Family is also wanting surgery done.    Hospital Course:  Paranoid schizophrenia  Patient had been at [facility] prior to this admission with confusion, paranoia and disorganized thinking.  - Upon transfer to [facility] IVC paperwork completed, and psychiatry was consulted and followed and managed as while he was in the hospital.  - The time of this and discharged to the IVC status has been discontinued, patient is to continue his current medications and followup with psych. Hypertension, Uncontrolled  -Patient's blood pressure was poorly controlled and he was placed on atenolol, Norvasc, clonidine, hydralazine and the doses adjusted as appropriate for better blood pressure control. Nursing home M.D./outpatient M.D. to further monitor and manage as clinically appropriate. - The impression was that his agitation from Psychiatric condition was likely contributing to the poor control of his sugars.    Poor PO Intake improving:   - Diet was changed toregular to encourage him to eat whatever he could given his poor by mouth intake  - Resource [place]reeze TID  - Dietitian was consulted and followed patient in the hospital.  Dietitian recommended considering peg tube placement but it was thought that May not be a good option given patient's continued confusion and h/o paranoid schizophrenia  as he may pull at the g tube.  - Family was encouraged to bring patient food and his po intake seemed to improve with this Hypoglycemia, likely from not eating initially in the hospital Resolved  - No hypoglycemia recently reported. Has resolved.  HCAP/Hypoxemia, resolved. The patient completed a full course of levofloxac on 3/14.   Left subcapital femoral neck fracture s/p Left hip hemiarthroplasty,  03/31/2012.  Upon admission orthopedics was consulted and saw patient and followed in his left hip hemiarthroplasty was done on 3/6 per Dr. [name] Per Ortho followed and recommended:  WBAT, mobilizing better  Knee immobilizer when in bed  Staples out-removed on postop day 12  VTE proph: ECASA BID, SCDs And patient is to followup with Dr. [name] in 2 weeks -Patient is being discharged to nursing facility at this time for rehabilitation. CKD stage 3  -nephrotoxins were minimized in the hospital and his medications renally dosed  -His creatinine improved to 1.39 on 3/21 -his baseline Diabetes mellitus type 2  - Hemoglobin A1c 8.8 on 03/22/2012  -Continue NovoLog sliding scale upon discharge.  Anemia due to b12 and iron deficiency  -Repeat serum B12 as this was marginally low on 03/22/2012--> repeat B12 on 04/01/2012--276  -Continue oral B12  -continue ferrous sulfate  -  GI follow up outpatient recommended     Procedures: Left hip hemiarthroplasty-done on 3/6 by Dr. [name]   Consultations:  Orthopedics-Dr. [name]  Psychiatry-Dr. [name]  Discharge Exam: Filed Vitals:   04/17/12 1449 04/17/12 2100 04/18/12 0152 04/18/12 0631  BP: 170/90 190/94 172/92 184/89  Pulse: 75 74 76 73  Temp: 97.7 F (36.5 C) 98.1 F (36.7 C)  97.9 F (36.6 C)  TempSrc: Oral Oral  Oral  Resp: 18 18  20   Height:      Weight:      SpO2: 98% 96%  98%    Exam:  General: Pt laying in bed supine, Alert and Awake  Cardiovascular: RRR, nl S1S2  Respiratory: CTA BL, no wheezes  Abdomen: soft, NT, ND  Musculoskeletal: no cyanosis, no brace in place.    Discharge Instructions  Discharge Orders   Future Orders Complete By Expires     Diet general  As directed     Discharge instructions  As directed     Comments:      -WBAT, post hip precautions, continue knee immobilizer when not with PT   -Please watch pt and ensure knee immobilizer maintained at all times when not with PT, can  tape -Continue PT VTE proph: ASA 325mg  BID, SCDs    Increase activity slowly  As directed     Weight bearing as tolerated  As directed         Medication List    TAKE these medications       amLODipine 10 MG tablet  Commonly known as:  NORVASC  Take 1 tablet (10 mg total) by mouth daily.     ARIPiprazole 5 MG tablet  Commonly known as:  ABILIFY  Take 1 tablet (5 mg total) by mouth daily.     aspirin EC 325 MG tablet  Take 1 tablet (325 mg total) by mouth 2 (two) times daily.     atenolol 100 MG tablet  Commonly known as:  TENORMIN  Take 1 tablet (100 mg total) by mouth daily.     bisacodyl 5 MG EC tablet  Commonly known as:  DULCOLAX  Take 1 tablet (5 mg total) by mouth daily as needed.  calcium carbonate 1250 MG tablet  Commonly known as:  OS-CAL - dosed in mg of elemental calcium  Take 1 tablet (500 mg of elemental calcium total) by mouth daily with breakfast.     clonazePAM 0.5 MG tablet  Commonly known as:  KLONOPIN  Take 0.5 tablets (0.25 mg total) by mouth 2 (two) times daily.     cloNIDine 0.3 mg/24hr  Commonly known as:  CATAPRES - Dosed in mg/24 hr  Place 1 patch (0.3 mg total) onto the skin once a week.     cyanocobalamin 1000 MCG tablet  Take 1 tablet (1,000 mcg total) by mouth daily.     DSS 100 MG Caps  Take 100 mg by mouth 2 (two) times daily.     feeding supplement Liqd  Take 1 Container by mouth 2 (two) times daily between meals.     ferrous sulfate 325 (65 FE) MG tablet  Take 1 tablet (325 mg total) by mouth 2 (two) times daily with a meal.     haloperidol 10 MG tablet  Commonly known as:  HALDOL  Take 1 tablet (10 mg total) by mouth 2 (two) times daily.     hydrALAZINE 25 MG tablet  Commonly known as:  APRESOLINE  Take 1 tablet (25 mg total) by mouth 3 (three) times daily.     LORazepam 2 MG tablet  Commonly known as:  ATIVAN  Take 1 tablet (2 mg total) by mouth every 6 (six) hours as needed for anxiety (and agitation).      ondansetron 4 MG tablet  Commonly known as:  ZOFRAN  Take 1 tablet (4 mg total) by mouth every 6 (six) hours as needed for nausea.     OXcarbazepine 150 MG tablet  Commonly known as:  TRILEPTAL  Take 3 tablets (450 mg total) by mouth 2 (two) times daily.     oxyCODONE-acetaminophen 5-325 MG per tablet  Commonly known as:  ROXICET  Take 1-2 tablets by mouth every 4 (four) hours as needed for pain.     pantoprazole 40 MG tablet  Commonly known as:  PROTONIX  Take 1 tablet (40 mg total) by mouth daily.     polyethylene glycol packet  Commonly known as:  MIRALAX / GLYCOLAX  Take 17 g by mouth daily as needed.     trihexyphenidyl 5 MG tablet  Commonly known as:  ARTANE  Take 1 tablet (5 mg total) by mouth 2 (two) times daily with a meal.     zolpidem 5 MG tablet  Commonly known as:  AMBIEN  Take 1 tablet (5 mg total) by mouth at bedtime as needed and may repeat dose one time if needed for sleep.           Follow-up Information   Follow up with David Paris, MD. Schedule an appointment as soon as possible for a visit in 2 weeks.   Contact information:   [address] [place] [address] [phone]        The results of significant diagnostics from this hospitalization (including imaging, microbiology, ancillary and laboratory) are listed below for reference.    Significant Diagnostic Studies: Dg Hip Complete Left  03/30/2012  s*RADIOLOGY REPORT*  Clinical Data: Status post fall; left hip pain.  LEFT HIP - COMPLETE 2+ VIEW  Comparison: None.  Findings: There is a mildly displaced subcapital fracture involving the left femoral neck, with mild rotation of the femoral head.  The left femoral head remains seated at  the acetabulum.  The right hip joint is unremarkable in appearance.  The sacroiliac joints are unremarkable in appearance.  The visualized bowel gas pattern is grossly unremarkable.  IMPRESSION: Mildly displaced subcapital fracture involving  the left femoral neck.   Original Report Authenticated By: Tonia Ghent, M.D.    Ct Head Wo Contrast  03/30/2012  *RADIOLOGY REPORT*  Clinical Data:  Status post fall; laceration above the left eye. Concern for head or cervical spine injury.  CT HEAD WITHOUT CONTRAST AND CT CERVICAL SPINE WITHOUT CONTRAST  Technique:  Multidetector CT imaging of the head and cervical spine was performed following the standard protocol without intravenous contrast.  Multiplanar CT image reconstructions of the cervical spine were also generated.  Comparison: None  CT HEAD  Findings: There is no evidence of acute infarction, mass lesion, or intra- or extra-axial hemorrhage on CT.  Prominence of the sulci suggests mild cortical volume loss. Relatively diffuse periventricular and subcortical white matter change likely reflects small vessel ischemic microangiopathy. Cerebellar atrophy is noted.  A small chronic lacunar infarct is noted at the anterior right thalamus.  Chronic ischemic change is seen at the external capsule bilaterally.  The brainstem and fourth ventricle are within normal limits.  The cerebral hemispheres demonstrate grossly normal gray-white differentiation.  No mass effect or midline shift is seen.   There is no evidence of fracture; visualized osseous structures are unremarkable in appearance.  The orbits are within normal limits.  The paranasal sinuses and mastoid air cells are well- aerated.  The known soft tissue laceration is not well characterized on CT.  IMPRESSION:  1.  No evidence of traumatic intracranial injury or fracture. 2.  Mild cortical volume loss and diffuse small vessel ischemic microangiopathy. 3.  Small chronic infarct at the anterior right thalamus, and chronic ischemic change at the external capsule bilaterally.  CT CERVICAL SPINE  Findings: There is no evidence of fracture or subluxation. Vertebral bodies demonstrate normal height and alignment. Intervertebral disc spaces are preserved.  Small  anterior disc osteophyte complexes are noted along the cervical spine. Prevertebral soft tissues are within normal limits.  The visualized neural foramina are grossly unremarkable.  The thyroid gland is unremarkable in appearance.  Diffuse interstitial prominence is noted at the lung apices, with small bilateral pleural effusions, concerning for pulmonary edema.  Mild calcification is noted along the left vertebral artery at the level of C2.  IMPRESSION:  1.  No evidence of fracture or subluxation along the cervical spine. 2.  Diffuse interstitial prominence of the lung apices, with small bilateral pleural effusions, concerning for pulmonary edema.   Original Report Authenticated By: Tonia Ghent, M.D.    Ct Cervical Spine Wo Contrast  03/30/2012  *RADIOLOGY REPORT*  Clinical Data:  Status post fall; laceration above the left eye. Concern for head or cervical spine injury.  CT HEAD WITHOUT CONTRAST AND CT CERVICAL SPINE WITHOUT CONTRAST  Technique:  Multidetector CT imaging of the head and cervical spine was performed following the standard protocol without intravenous contrast.  Multiplanar CT image reconstructions of the cervical spine were also generated.  Comparison: None  CT HEAD  Findings: There is no evidence of acute infarction, mass lesion, or intra- or extra-axial hemorrhage on CT.  Prominence of the sulci suggests mild cortical volume loss. Relatively diffuse periventricular and subcortical white matter change likely reflects small vessel ischemic microangiopathy. Cerebellar atrophy is noted.  A small chronic lacunar infarct is noted at the anterior right thalamus.  Chronic ischemic change  is seen at the external capsule bilaterally.  The brainstem and fourth ventricle are within normal limits.  The cerebral hemispheres demonstrate grossly normal gray-white differentiation.  No mass effect or midline shift is seen.   There is no evidence of fracture; visualized osseous structures are unremarkable in  appearance.  The orbits are within normal limits.  The paranasal sinuses and mastoid air cells are well- aerated.  The known soft tissue laceration is not well characterized on CT.  IMPRESSION:  1.  No evidence of traumatic intracranial injury or fracture. 2.  Mild cortical volume loss and diffuse small vessel ischemic microangiopathy. 3.  Small chronic infarct at the anterior right thalamus, and chronic ischemic change at the external capsule bilaterally.  CT CERVICAL SPINE  Findings: There is no evidence of fracture or subluxation. Vertebral bodies demonstrate normal height and alignment. Intervertebral disc spaces are preserved.  Small anterior disc osteophyte complexes are noted along the cervical spine. Prevertebral soft tissues are within normal limits.  The visualized neural foramina are grossly unremarkable.  The thyroid gland is unremarkable in appearance.  Diffuse interstitial prominence is noted at the lung apices, with small bilateral pleural effusions, concerning for pulmonary edema.  Mild calcification is noted along the left vertebral artery at the level of C2.  IMPRESSION:  1.  No evidence of fracture or subluxation along the cervical spine. 2.  Diffuse interstitial prominence of the lung apices, with small bilateral pleural effusions, concerning for pulmonary edema.   Original Report Authenticated By: Tonia Ghent, M.D.    Dg Pelvis Portable  03/31/2012  *RADIOLOGY REPORT*  Clinical Data: Postop  PORTABLE PELVIS  Comparison: 03/30/2012.  Findings: Portable film demonstrates satisfactory appearance status post left hemiarthroplasty.  IMPRESSION: Satisfactory position and alignment.   Original Report Authenticated By: Davonna Belling, M.D.    Dg Chest Port 1 View  04/01/2012  *RADIOLOGY REPORT*  Clinical Data: Fever.  PORTABLE CHEST - 1 VIEW  Comparison: Chest radiograph performed 03/30/2012  Findings: The lungs are mildly hypoexpanded.  Vascular congestion is noted, with mild increased interstitial  markings, raise concern for mild pulmonary edema.  This is grossly stable from the prior study.  There is no evidence of pleural effusion or pneumothorax.  The cardiomediastinal silhouette is borderline normal in size.  No acute osseous abnormalities are seen.  IMPRESSION: Lungs mildly hypoexpanded.  Vascular congestion, with mildly increased interstitial markings, raising concern for mild pulmonary edema.  This appears relatively stable from the recent prior study.   Original Report Authenticated By: Tonia Ghent, M.D.    Dg Chest Port 1 View  03/30/2012  *RADIOLOGY REPORT*  Clinical Data: Preoperative chest radiograph.  PORTABLE CHEST - 1 VIEW  Comparison: Chest radiograph performed 02/26/2012  Findings: The lungs are well-aerated.  Vascular congestion is noted.  Bilateral central and bibasilar airspace opacities may reflect mild pulmonary edema.  There is no pleural effusion or pneumothorax.  The cardiomediastinal silhouette is borderline normal in size.  No acute osseous abnormalities are seen.  IMPRESSION: Vascular congestion noted.  Bilateral central and bibasilar airspace opacities may reflect mild pulmonary edema.  This may be transient, due to the patient's recent fall.   Original Report Authenticated By: Tonia Ghent, M.D.     Microbiology: No results found for this or any previous visit (from the past 240 hour(s)).   Labs: Basic Metabolic Panel:  Recent Labs Lab 04/15/12 0438  NA 138  K 4.0  CL 102  CO2 27  GLUCOSE 130*  BUN 17  CREATININE 1.39*  CALCIUM 8.6   Liver Function Tests: No results found for this basename: AST, ALT, ALKPHOS, BILITOT, PROT, ALBUMIN,  in the last 168 hours No results found for this basename: LIPASE, AMYLASE,  in the last 168 hours No results found for this basename: AMMONIA,  in the last 168 hours CBC: No results found for this basename: WBC, NEUTROABS, HGB, HCT, MCV, PLT,  in the last 168 hours Cardiac Enzymes: No results found for this basename:  CKTOTAL, CKMB, CKMBINDEX, TROPONINI,  in the last 168 hours BNP: BNP (last 3 results) No results found for this basename: PROBNP,  in the last 8760 hours CBG:  Recent Labs Lab 04/17/12 0736 04/17/12 1216 04/17/12 1650 04/17/12 2058 04/18/12 0800  GLUCAP 85 85 88 173* 101*       Signed:  Amerie Beaumont C  [facility] 04/18/2012, 11:20 AM

## 2012-04-18 NOTE — Progress Notes (Signed)
Clinical Social Work  Per unit CSW, patient to [employer] to SNF today. MD signed Change of Commitment form and CSW faxed to Magistrate. CSW is signing off but available if further needs arise.  [place], [place] [phone]

## 2012-04-18 NOTE — Progress Notes (Signed)
Clinical Social Work Department CLINICAL SOCIAL WORK PLACEMENT NOTE 04/18/2012  Patient:  David Lang,David Lang  Account Number:  [id number] Admit date:  03/30/2012  Clinical Social Worker:  Becky Sax, LCSW  Date/time:  04/18/2012 12:00 M  Clinical Social Work is seeking post-discharge placement for this patient at the following level of care:   SKILLED NURSING   (*CSW will update this form in Epic as items are completed)   04/04/2012  Patient/family provided with [facility] Department of Clinical Social Work's list of facilities offering this level of care within the geographic area requested by the patient (or if unable, by the patient's family).  04/04/2012  Patient/family informed of their freedom to choose among providers that offer the needed level of care, that participate in Medicare, Medicaid or managed care program needed by the patient, have an available bed and are willing to accept the patient.  04/04/2012  Patient/family informed of [facility]' ownership interest in [facility], as well as of the fact that they are under no obligation to receive care at this facility.  PASARR submitted to EDS on 04/04/2012 PASARR number received from [employer] on 04/04/2012  FL2 transmitted to all facilities in geographic area requested by pt/family on  04/04/2012 FL2 transmitted to all facilities within larger geographic area on 04/11/2012  Patient informed that his/her managed care company has contracts with or will negotiate with  certain facilities, including the following:     Patient/family informed of bed offers received:  04/18/2012 Patient chooses bed at  Physician recommends and patient chooses bed at    Patient to be transferred to  on   Patient to be transferred to facility by   The following physician request were entered in Epic:   Additional Comments: patient to go to [facility] on letter of guarantee

## 2012-04-18 NOTE — Progress Notes (Signed)
Patient cleared for discharge. Letter of guarantee bed available at [facility] in [place]. Packet copied and placed in [place]. CSW called patient's sister in [place] and gave her the name and number of the facility. ptar called for transportation.  Shelvy Heckert C. Adedamola Seto MSW, LCSW [phone]

## 2013-06-24 IMAGING — CT CT HEAD W/O CM
3 of 6 series · 16 of 30 positions shown, 18 images · non-contrast
Comparison: None

CT HEAD

CLINICAL DATA: Status post fall; laceration above the left eye.
Concern for head or cervical spine injury.

CT HEAD WITHOUT CONTRAST AND CT CERVICAL SPINE WITHOUT CONTRAST
TECHNIQUE: Multidetector CT imaging of the head and cervical spine
was performed following the standard protocol without intravenous
contrast.  Multiplanar CT image reconstructions of the cervical
spine were also generated.

[Series 4: bone windows · axial · 0.46mm/px · z∈[-90,-12]mm · 3 of 54 slices shown]
[im 14/54  bone]
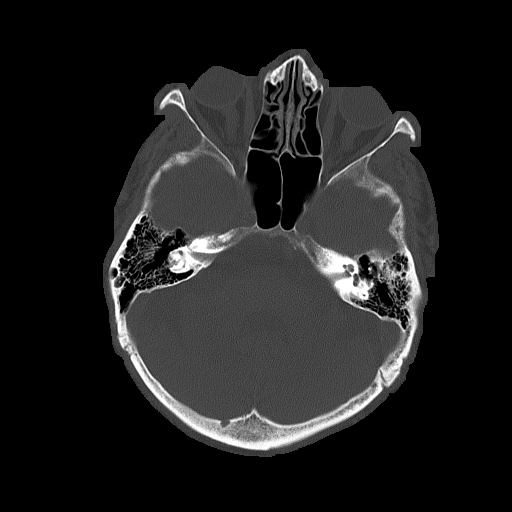
[im 27/54  bone]
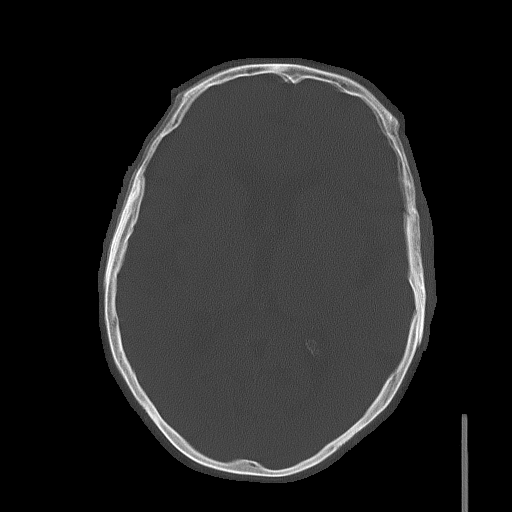
[im 40/54  bone]
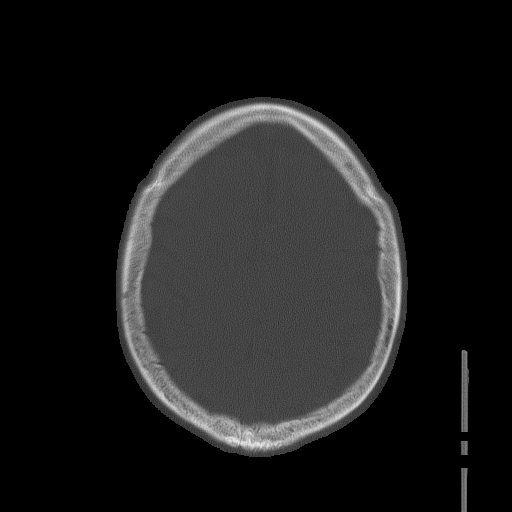

[Series 7: c-spine st · axial · 0.28mm/px · z∈[-285,-197]mm · 5 of 101 slices shown]
[im 12/101  brain]
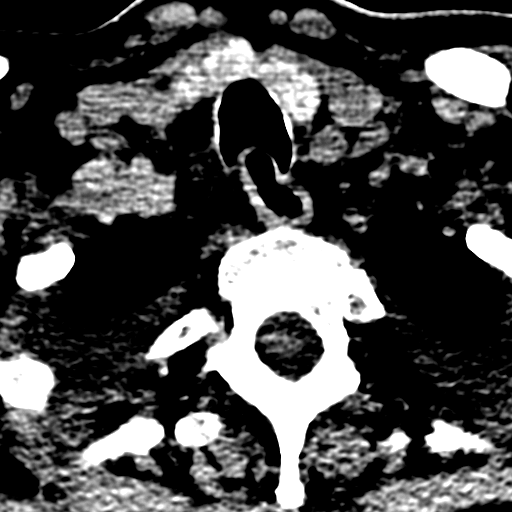
[im 23/101  brain]
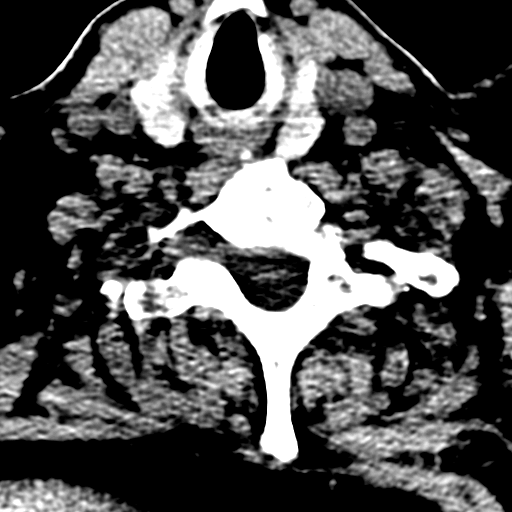
[im 34/101  brain]
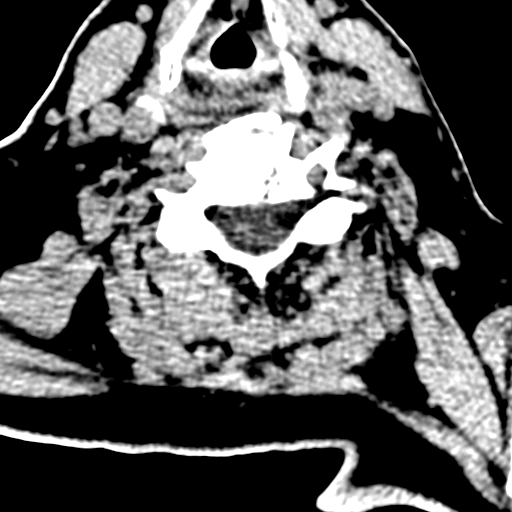
[im 45/101  brain]
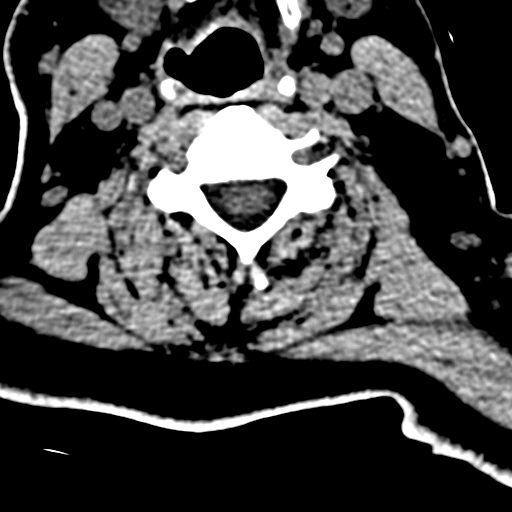
[im 56/101  brain]
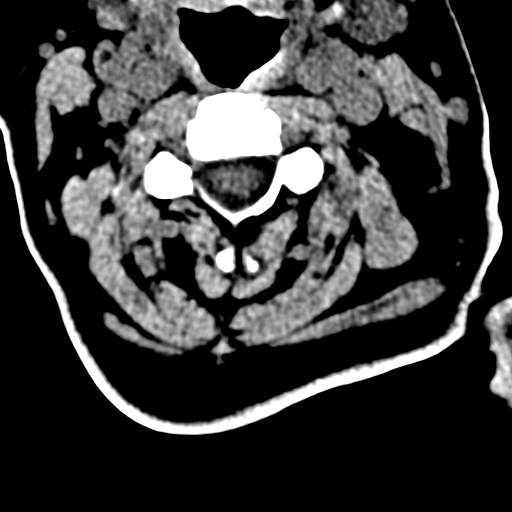

[Series 9: axial recon · axial · 0.23mm/px · z∈[-309,-149]mm · 8 of 107 slices shown, 10 images]
[im 12/107  brain]
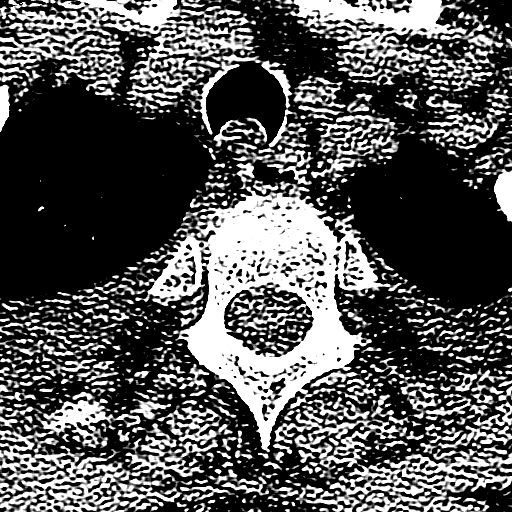
[im 12/107  bone]
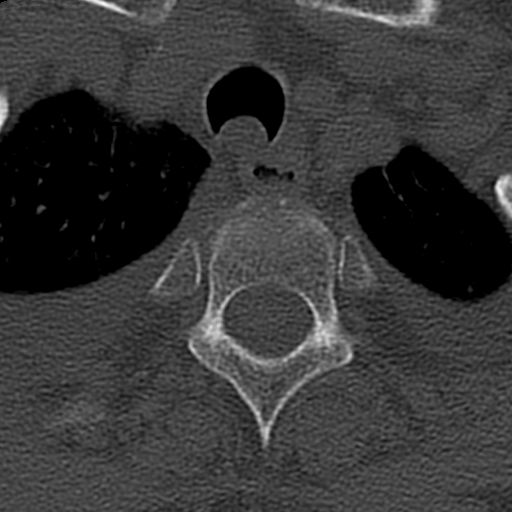
[im 24/107  brain]
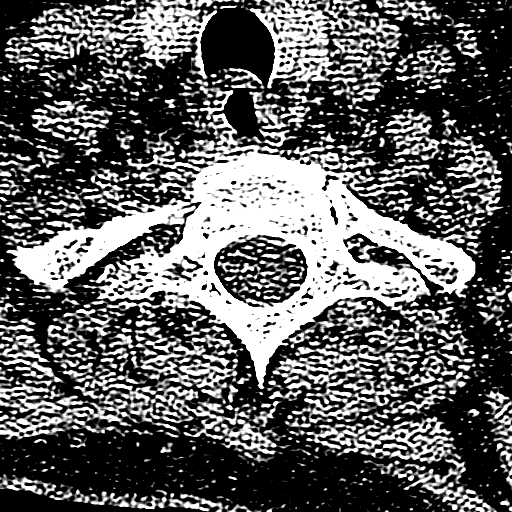
[im 36/107  brain]
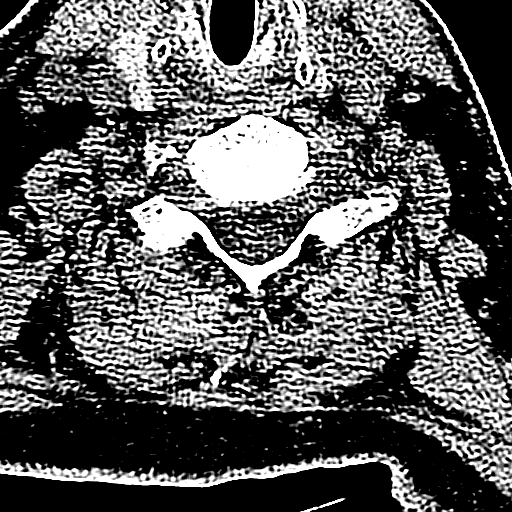
[im 48/107  brain]
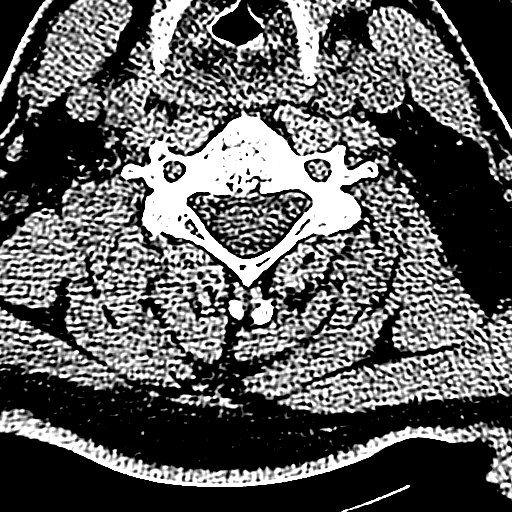
[im 59/107  brain]
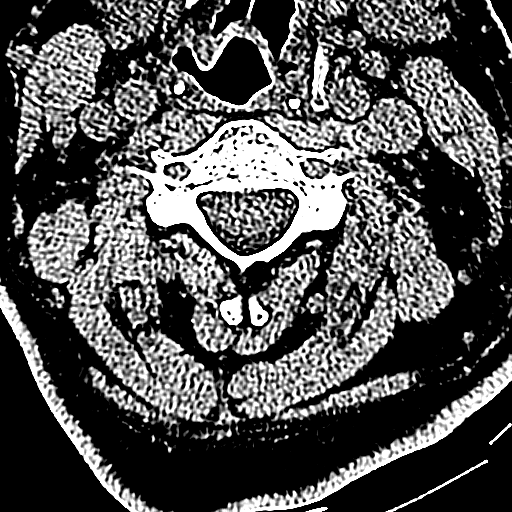
[im 59/107  bone]
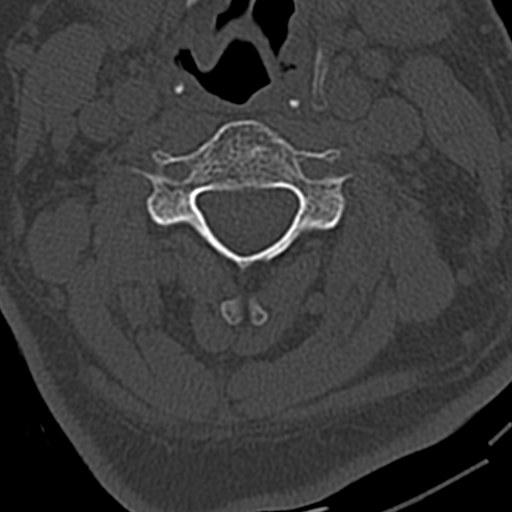
[im 71/107  brain]
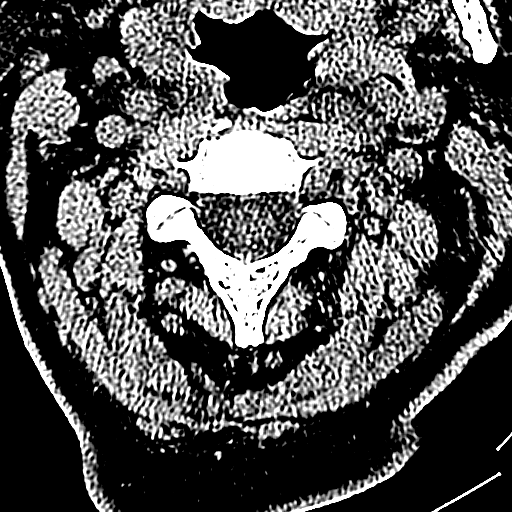
[im 83/107  brain]
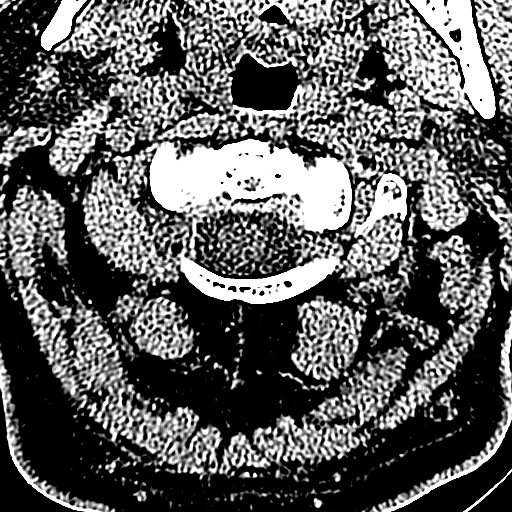
[im 95/107  brain]
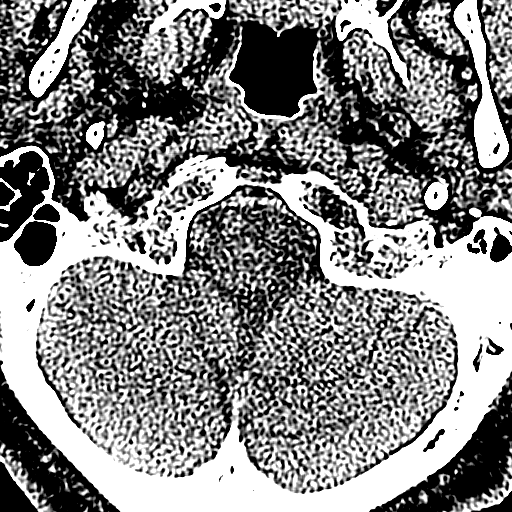

[16 of 30 positions shown; findings below may reference images not displayed]

FINDINGS: There is no evidence of acute infarction, mass lesion, or
intra- or extra-axial hemorrhage on CT.

Prominence of the sulci suggests mild cortical volume loss.
Relatively diffuse periventricular and subcortical white matter
change likely reflects small vessel ischemic microangiopathy.
Cerebellar atrophy is noted.  A small chronic lacunar infarct is
noted at the anterior right thalamus.  Chronic ischemic change is
seen at the external capsule bilaterally.

The brainstem and fourth ventricle are within normal limits.  The
cerebral hemispheres demonstrate grossly normal gray-white
differentiation.  No mass effect or midline shift is seen.

 There is no evidence of fracture; visualized osseous structures
are unremarkable in appearance.  The orbits are within normal
limits.  The paranasal sinuses and mastoid air cells are well-
aerated.  The known soft tissue laceration is not well
characterized on CT.
IMPRESSION: 1.  No evidence of traumatic intracranial injury or fracture.
2.  Mild cortical volume loss and diffuse small vessel ischemic
microangiopathy.
3.  Small chronic infarct at the anterior right thalamus, and
chronic ischemic change at the external capsule bilaterally.

CT CERVICAL SPINE
FINDINGS: There is no evidence of fracture or subluxation.
Vertebral bodies demonstrate normal height and alignment.
Intervertebral disc spaces are preserved.  Small anterior disc
osteophyte complexes are noted along the cervical spine.
Prevertebral soft tissues are within normal limits.  The visualized
neural foramina are grossly unremarkable.

The thyroid gland is unremarkable in appearance.  Diffuse
interstitial prominence is noted at the lung apices, with small
bilateral pleural effusions, concerning for pulmonary edema.  Mild
calcification is noted along the left vertebral artery at the level
of C2.
IMPRESSION: 1.  No evidence of fracture or subluxation along the cervical
spine.
2.  Diffuse interstitial prominence of the lung apices, with small
bilateral pleural effusions, concerning for pulmonary edema.

## 2013-06-25 IMAGING — CR DG PORTABLE PELVIS
1 series · 1 of 1 positions shown · non-contrast
Comparison: 03/30/2012.

CLINICAL DATA: Postop

PORTABLE PELVIS

[AP]
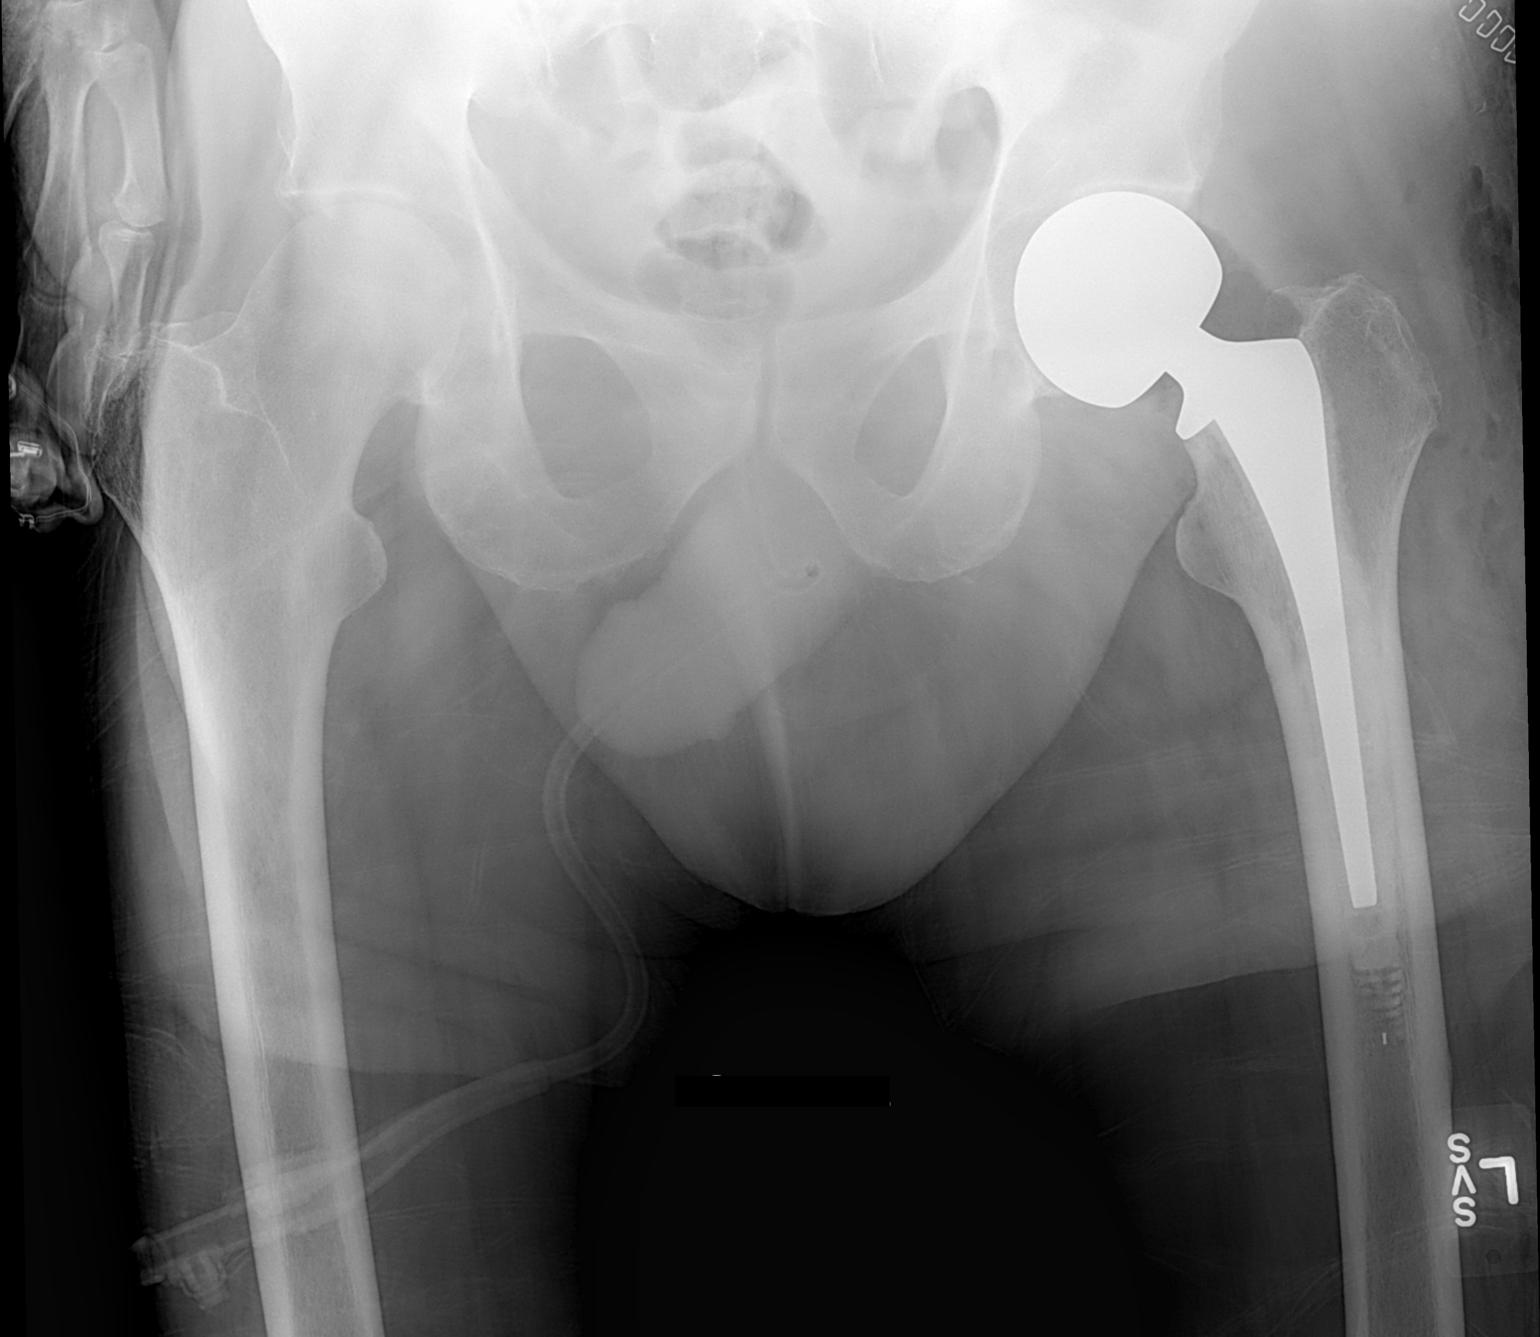

[1 of 1 positions shown; findings below may reference images not displayed]

FINDINGS: Portable film demonstrates satisfactory appearance status
post left hemiarthroplasty.
IMPRESSION: Satisfactory position and alignment.

## 2013-08-05 ENCOUNTER — Inpatient Hospital Stay (HOSPITAL_COMMUNITY)
Admission: EM | Admit: 2013-08-05 | Discharge: 2013-08-08 | DRG: 560 | Disposition: A | Discharge status: Skilled Nursing Facility | Payer: BC Managed Care – PPO | Attending: Internal Medicine | Admitting: Internal Medicine

## 2013-08-05 ENCOUNTER — Emergency Department (HOSPITAL_COMMUNITY): Payer: BC Managed Care – PPO

## 2013-08-05 ENCOUNTER — Encounter (HOSPITAL_COMMUNITY): Payer: Self-pay | Admitting: Emergency Medicine

## 2013-08-05 DIAGNOSIS — R5381 Other malaise: Secondary | ICD-10-CM | POA: Diagnosis present

## 2013-08-05 DIAGNOSIS — W19XXXA Unspecified fall, initial encounter: Secondary | ICD-10-CM | POA: Diagnosis present

## 2013-08-05 DIAGNOSIS — F209 Schizophrenia, unspecified: Secondary | ICD-10-CM | POA: Diagnosis present

## 2013-08-05 DIAGNOSIS — I129 Hypertensive chronic kidney disease with stage 1 through stage 4 chronic kidney disease, or unspecified chronic kidney disease: Secondary | ICD-10-CM | POA: Diagnosis present

## 2013-08-05 DIAGNOSIS — E119 Type 2 diabetes mellitus without complications: Secondary | ICD-10-CM

## 2013-08-05 DIAGNOSIS — IMO0001 Reserved for inherently not codable concepts without codable children: Secondary | ICD-10-CM

## 2013-08-05 DIAGNOSIS — I10 Essential (primary) hypertension: Secondary | ICD-10-CM | POA: Diagnosis present

## 2013-08-05 DIAGNOSIS — K5900 Constipation, unspecified: Secondary | ICD-10-CM | POA: Diagnosis present

## 2013-08-05 DIAGNOSIS — Z9660 Presence of unspecified orthopedic joint implant: Principal | ICD-10-CM | POA: Diagnosis present

## 2013-08-05 DIAGNOSIS — Y9210 Unspecified residential institution as the place of occurrence of the external cause: Secondary | ICD-10-CM | POA: Diagnosis present

## 2013-08-05 DIAGNOSIS — N179 Acute kidney failure, unspecified: Secondary | ICD-10-CM | POA: Diagnosis present

## 2013-08-05 DIAGNOSIS — E1165 Type 2 diabetes mellitus with hyperglycemia: Secondary | ICD-10-CM

## 2013-08-05 DIAGNOSIS — N183 Chronic kidney disease, stage 3 (moderate): Secondary | ICD-10-CM

## 2013-08-05 DIAGNOSIS — D649 Anemia, unspecified: Secondary | ICD-10-CM | POA: Diagnosis present

## 2013-08-05 DIAGNOSIS — Z7982 Long term (current) use of aspirin: Secondary | ICD-10-CM

## 2013-08-05 DIAGNOSIS — D509 Iron deficiency anemia, unspecified: Secondary | ICD-10-CM | POA: Diagnosis present

## 2013-08-05 DIAGNOSIS — Z79899 Other long term (current) drug therapy: Secondary | ICD-10-CM

## 2013-08-05 DIAGNOSIS — N189 Chronic kidney disease, unspecified: Secondary | ICD-10-CM | POA: Diagnosis present

## 2013-08-05 DIAGNOSIS — F200 Paranoid schizophrenia: Secondary | ICD-10-CM | POA: Diagnosis present

## 2013-08-05 DIAGNOSIS — Z96649 Presence of unspecified artificial hip joint: Secondary | ICD-10-CM

## 2013-08-05 DIAGNOSIS — R3989 Other symptoms and signs involving the genitourinary system: Secondary | ICD-10-CM | POA: Diagnosis present

## 2013-08-05 DIAGNOSIS — T84049A Periprosthetic fracture around unspecified internal prosthetic joint, initial encounter: Principal | ICD-10-CM | POA: Diagnosis present

## 2013-08-05 DIAGNOSIS — E871 Hypo-osmolality and hyponatremia: Secondary | ICD-10-CM | POA: Diagnosis present

## 2013-08-05 DIAGNOSIS — M978XXA Periprosthetic fracture around other internal prosthetic joint, initial encounter: Secondary | ICD-10-CM

## 2013-08-05 DIAGNOSIS — E1122 Type 2 diabetes mellitus with diabetic chronic kidney disease: Secondary | ICD-10-CM | POA: Diagnosis present

## 2013-08-05 DIAGNOSIS — IMO0002 Reserved for concepts with insufficient information to code with codable children: Secondary | ICD-10-CM | POA: Diagnosis present

## 2013-08-05 HISTORY — DX: Periprosthetic fracture around other internal prosthetic joint, initial encounter: Z96.649

## 2013-08-05 HISTORY — DX: Presence of unspecified artificial hip joint: M97.8XXA

## 2013-08-05 LAB — BASIC METABOLIC PANEL
Anion gap: 17 — ABNORMAL HIGH (ref 5–15)
BUN: 32 mg/dL — AB (ref 6–23)
CHLORIDE: 88 meq/L — AB (ref 96–112)
CO2: 23 meq/L (ref 19–32)
Calcium: 8.6 mg/dL (ref 8.4–10.5)
Creatinine, Ser: 2.6 mg/dL — ABNORMAL HIGH (ref 0.50–1.35)
GFR calc non Af Amer: 25 mL/min — ABNORMAL LOW (ref >90)
GFR, EST AFRICAN AMERICAN: 29 mL/min — AB (ref >90)
GLUCOSE: 243 mg/dL — AB (ref 70–99)
POTASSIUM: 4.3 meq/L (ref 3.7–5.3)
Sodium: 128 mEq/L — ABNORMAL LOW (ref 137–147)

## 2013-08-05 LAB — CBC WITH DIFFERENTIAL/PLATELET
Basophils Absolute: 0 10*3/uL (ref 0.0–0.1)
Basophils Relative: 0 % (ref 0–1)
EOS ABS: 0.2 10*3/uL (ref 0.0–0.7)
Eosinophils Relative: 2 % (ref 0–5)
HEMATOCRIT: 37.2 % — AB (ref 39.0–52.0)
HEMOGLOBIN: 12.7 g/dL — AB (ref 13.0–17.0)
LYMPHS ABS: 2 10*3/uL (ref 0.7–4.0)
Lymphocytes Relative: 17 % (ref 12–46)
MCH: 29.1 pg (ref 26.0–34.0)
MCHC: 34.1 g/dL (ref 30.0–36.0)
MCV: 85.3 fL (ref 78.0–100.0)
MONOS PCT: 8 % (ref 3–12)
Monocytes Absolute: 0.9 10*3/uL (ref 0.1–1.0)
NEUTROS PCT: 73 % (ref 43–77)
Neutro Abs: 8.5 10*3/uL — ABNORMAL HIGH (ref 1.7–7.7)
Platelets: 445 10*3/uL — ABNORMAL HIGH (ref 150–400)
RBC: 4.36 MIL/uL (ref 4.22–5.81)
RDW: 12.3 % (ref 11.5–15.5)
WBC: 11.6 10*3/uL — ABNORMAL HIGH (ref 4.0–10.5)

## 2013-08-05 LAB — PROTIME-INR
INR: 1.04 (ref 0.00–1.49)
Prothrombin Time: 13.6 seconds (ref 11.6–15.2)

## 2013-08-05 LAB — ABO/RH: ABO/RH(D): A POS

## 2013-08-05 LAB — TYPE AND SCREEN
ABO/RH(D): A POS
Antibody Screen: NEGATIVE

## 2013-08-05 LAB — GLUCOSE, CAPILLARY: Glucose-Capillary: 206 mg/dL — ABNORMAL HIGH (ref 70–99)

## 2013-08-05 LAB — SODIUM, URINE, RANDOM: Sodium, Ur: 20 mEq/L

## 2013-08-05 MED ORDER — INSULIN ASPART 100 UNIT/ML ~~LOC~~ SOLN
0.0000 [IU] | Freq: Every day | SUBCUTANEOUS | Status: DC
  Administered 2013-08-06: 2 [IU] via SUBCUTANEOUS

## 2013-08-05 MED ORDER — TRIHEXYPHENIDYL HCL 5 MG PO TABS
2.5000 mg | ORAL_TABLET | Freq: Every day | ORAL | Status: DC
Start: 2013-08-06 — End: 2013-08-08
  Administered 2013-08-06 – 2013-08-08 (×3): 2.5 mg via ORAL
  Filled 2013-08-05 (×4): qty 1

## 2013-08-05 MED ORDER — LISINOPRIL 10 MG PO TABS: 10.0000 mg | ORAL_TABLET | Freq: Every day | ORAL | Status: DC

## 2013-08-05 MED ORDER — HEPARIN SODIUM (PORCINE) 5000 UNIT/ML IJ SOLN
5000.0000 [IU] | Freq: Three times a day (TID) | INTRAMUSCULAR | Status: DC
  Administered 2013-08-06 – 2013-08-08 (×4): 5000 [IU] via SUBCUTANEOUS
  Filled 2013-08-05 (×9): qty 1

## 2013-08-05 MED ORDER — HALOPERIDOL 5 MG PO TABS
15.0000 mg | ORAL_TABLET | Freq: Two times a day (BID) | ORAL | Status: DC
  Administered 2013-08-05 – 2013-08-08 (×6): 15 mg via ORAL
  Filled 2013-08-05 (×7): qty 3

## 2013-08-05 MED ORDER — TRIHEXYPHENIDYL HCL 5 MG PO TABS: 2.5000 mg | ORAL_TABLET | ORAL | Status: DC

## 2013-08-05 MED ORDER — HYDRALAZINE HCL 50 MG PO TABS
50.0000 mg | ORAL_TABLET | Freq: Three times a day (TID) | ORAL | Status: DC
  Administered 2013-08-06 – 2013-08-08 (×9): 50 mg via ORAL
  Filled 2013-08-05 (×11): qty 1

## 2013-08-05 MED ORDER — ATENOLOL 100 MG PO TABS
100.0000 mg | ORAL_TABLET | Freq: Every day | ORAL | Status: DC
  Administered 2013-08-06 – 2013-08-08 (×3): 100 mg via ORAL
  Filled 2013-08-05 (×3): qty 1

## 2013-08-05 MED ORDER — HYDROMORPHONE HCL PF 1 MG/ML IJ SOLN
0.2500 mg | INTRAMUSCULAR | Status: DC | PRN
  Administered 2013-08-06: 0.25 mg via INTRAVENOUS
  Filled 2013-08-05: qty 1

## 2013-08-05 MED ORDER — TRIHEXYPHENIDYL HCL 5 MG PO TABS
5.0000 mg | ORAL_TABLET | Freq: Every day | ORAL | Status: DC
  Administered 2013-08-06 – 2013-08-07 (×3): 5 mg via ORAL
  Filled 2013-08-05 (×4): qty 1

## 2013-08-05 MED ORDER — AMLODIPINE BESYLATE 10 MG PO TABS
10.0000 mg | ORAL_TABLET | Freq: Every day | ORAL | Status: DC
  Administered 2013-08-06 – 2013-08-08 (×3): 10 mg via ORAL
  Filled 2013-08-05 (×3): qty 1

## 2013-08-05 MED ORDER — FENTANYL CITRATE 0.05 MG/ML IJ SOLN
25.0000 ug | INTRAMUSCULAR | Status: AC | PRN
  Administered 2013-08-05 (×2): 25 ug via INTRAVENOUS
  Filled 2013-08-05 (×2): qty 2

## 2013-08-05 MED ORDER — OXCARBAZEPINE 300 MG PO TABS
450.0000 mg | ORAL_TABLET | Freq: Two times a day (BID) | ORAL | Status: DC
  Administered 2013-08-06 – 2013-08-08 (×6): 450 mg via ORAL
  Filled 2013-08-05 (×7): qty 1

## 2013-08-05 MED ORDER — CLONAZEPAM 0.5 MG PO TABS
0.2500 mg | ORAL_TABLET | Freq: Two times a day (BID) | ORAL | Status: DC
  Administered 2013-08-06 – 2013-08-08 (×6): 0.25 mg via ORAL
  Filled 2013-08-05 (×6): qty 1

## 2013-08-05 MED ORDER — MORPHINE SULFATE 4 MG/ML IJ SOLN
4.0000 mg | Freq: Once | INTRAMUSCULAR | Status: AC
  Administered 2013-08-05: 4 mg via INTRAVENOUS
  Filled 2013-08-05: qty 1

## 2013-08-05 MED ORDER — ASPIRIN EC 81 MG PO TBEC
81.0000 mg | DELAYED_RELEASE_TABLET | Freq: Every day | ORAL | Status: DC
  Administered 2013-08-06 – 2013-08-08 (×3): 81 mg via ORAL
  Filled 2013-08-05 (×3): qty 1

## 2013-08-05 MED ORDER — INSULIN ASPART 100 UNIT/ML ~~LOC~~ SOLN
0.0000 [IU] | Freq: Three times a day (TID) | SUBCUTANEOUS | Status: DC
  Administered 2013-08-06 – 2013-08-07 (×5): 2 [IU] via SUBCUTANEOUS
  Administered 2013-08-08: 1 [IU] via SUBCUTANEOUS
  Administered 2013-08-08: 2 [IU] via SUBCUTANEOUS

## 2013-08-05 MED ORDER — PANTOPRAZOLE SODIUM 40 MG PO TBEC
40.0000 mg | DELAYED_RELEASE_TABLET | Freq: Every day | ORAL | Status: DC
  Administered 2013-08-06 – 2013-08-08 (×3): 40 mg via ORAL
  Filled 2013-08-05 (×3): qty 1

## 2013-08-05 MED ORDER — SODIUM CHLORIDE 0.9 % IV SOLN
INTRAVENOUS | Status: AC
  Administered 2013-08-06: via INTRAVENOUS

## 2013-08-05 NOTE — H&P (Signed)
Date: 08/05/2013               Patient Name:  David Lang MRN: [id number]  DOB: Jun 22, 1950 Age / Sex: 63 y.o., male   PCP: Provider Default, MD         Medical Service: Internal Medicine Teaching Service         Attending Physician: Dr. Josem Kaufmann    First Contact: Dr. Eleonore Chiquito Pager: [phone]  Second Contact: Dr. Darden Palmer Pager: [phone]       After Hours (After 5p/  First Contact Pager: [phone]  weekends / holidays): Second Contact Pager: [phone]   Chief Complaint: left hip pain  History of Present Illness: Mr. Riddles is a 63 year old man with history of HTN, DM, schizophrenia, left displaced femoral neck fracture s/p left hip hemiarthroplasty by Dr. [name] 03/31/2012 p/w left hip pain after fall x 2 days. Pain is nonradiating and exascerbated by movement. He denies any lightheadedness, dizziness, or palpitations prior to fall from standing. Felt like someone pushed him but no one was there. Denies hitting head or losing consciousness. He remembers the event. Per patient's caregiver who visits him at the skilled nursing facility, [facility], patient fell 2 days ago, and his mental status is at baseline. He has been nonambulatory since the fall. He has had bilateral lower extremity swelling worse than his baseline. Endorses nonproductive cough. Denies fevers, chills, chest pain, shortness of breath, palpitations, nausea, vomiting, abdominal pain, diarrhea, dysuria, rash, or paresthesias. Constipated at baseline.  Meds: Current Facility-Administered Medications  Medication Dose Route Frequency Provider Last Rate Last Dose  . fentaNYL (SUBLIMAZE) injection 25 mcg  25 mcg Intravenous Q30 min PRN Imagene Sheller, MD   25 mcg at 08/05/13 1952   Current Outpatient Prescriptions  Medication Sig Dispense Refill  . amLODipine (NORVASC) 10 MG tablet Take 1 tablet (10 mg total) by mouth daily.      Marland Kitchen aspirin EC 81 MG tablet Take 81 mg by mouth daily.      Marland Kitchen atenolol (TENORMIN) 100 MG  tablet Take 1 tablet (100 mg total) by mouth daily.      . clonazePAM (KLONOPIN) 0.5 MG tablet Take 0.5 tablets (0.25 mg total) by mouth 2 (two) times daily.  30 tablet  0  . cloNIDine (CATAPRES - DOSED IN MG/24 HR) 0.3 mg/24hr Place 1 patch (0.3 mg total) onto the skin once a week.  4 patch    . haloperidol (HALDOL) 10 MG tablet Take 15 mg by mouth 2 (two) times daily.      . haloperidol (HALDOL) 2 MG tablet Take 2 mg by mouth daily as needed (for increased psychosis/hallucinations with agitation or aggression.).      Marland Kitchen hydrALAZINE (APRESOLINE) 50 MG tablet Take 50 mg by mouth 3 (three) times daily.      Marland Kitchen lisinopril (PRINIVIL,ZESTRIL) 10 MG tablet Take 10 mg by mouth daily.      Marland Kitchen loperamide (IMODIUM) 2 MG capsule Take 2 mg by mouth 3 (three) times daily as needed for diarrhea or loose stools.      . ondansetron (ZOFRAN) 4 MG tablet Take 1 tablet (4 mg total) by mouth every 6 (six) hours as needed for nausea.  20 tablet    . OXcarbazepine (TRILEPTAL) 150 MG tablet Take 3 tablets (450 mg total) by mouth 2 (two) times daily.      . OYSTER SHELL CALCIUM PO Take 1 tablet by mouth daily.      . pantoprazole (PROTONIX) 40 MG  tablet Take 1 tablet (40 mg total) by mouth daily.      . polyethylene glycol (MIRALAX / GLYCOLAX) packet Take 17 g by mouth daily as needed.  14 each    . trihexyphenidyl (ARTANE) 5 MG tablet Take 2.5-5 mg by mouth See admin instructions. Take 2.5mg  by mouth in the morning and take 5mg  by mouth at bedtime.      . vitamin B-12 1000 MCG tablet Take 1 tablet (1,000 mcg total) by mouth daily.        Allergies: Allergies as of 08/05/2013  . (No Known Allergies)   Past Medical History  Diagnosis Date  . Hypertension   . Diabetes mellitus without complication   . Mental disorder   . Schizophrenia    Past Surgical History  Procedure Laterality Date  . Hip arthroplasty Left 03/31/2012    Procedure: ARTHROPLASTY BIPOLAR HIP;  Surgeon: Mable ParisJustin William Chandler, MD;  Location: [facility];  Service: Orthopedics;  Laterality: Left;   History reviewed. No pertinent family history. History   Social History  . Marital Status: Single    Spouse Name: N/A    Number of Children: N/A  . Years of Education: N/A   Occupational History  . Not on file.   Social History Main Topics  . Smoking status: Never Smoker   . Smokeless tobacco: Never Used  . Alcohol Use: No     Comment: not answering  . Drug Use: No     Comment: not answering questions  . Sexual Activity: No     Comment: not cooperative with questions   Other Topics Concern  . Not on file   Social History Narrative  . No narrative on file    Review of Systems: Constitutional: negative Eyes: negative Ears, nose, mouth, throat, and face: negative Respiratory: positive for cough Cardiovascular: negative Gastrointestinal: positive for constipation Genitourinary:negative Integument/breast: negative Hematologic/lymphatic: negative Musculoskeletal:negative, as noted in HPI Neurological: negative Behavioral/Psych: positive for paranoid schizophrenia Endocrine: negative Allergic/Immunologic: negative  Physical Exam: Blood pressure 163/76, pulse 81, temperature 97.4 F (36.3 C), temperature source Oral, resp. rate 16, SpO2 98.00%. BP 139/69  Pulse 82  Temp(Src) 97.4 F (36.3 C) (Oral)  Resp 12  SpO2 93%  General Appearance:    Alert, cooperative, no distress  Head:    Normocephalic, without obvious abnormality, atraumatic  Eyes:    PERRL, conjunctiva/corneas clear, EOM's intact, both eyes   Nose:   Nares normal, septum midline, mucosa normal, no drainage    or sinus tenderness  Throat:   Lips, mucosa, and tongue normal  Neck:   Supple, symmetrical, trachea midline  Lungs:     Clear to auscultation bilaterally, respirations unlabored  Chest wall:    No tenderness or deformity  Heart:    Regular rate and rhythm, S1 and S2 normal, no murmur, rub   or gallop  Abdomen:     Soft, non-tender, bowel  sounds active all four quadrants,    no masses, no organomegaly  Extremities:   Tenderness to palpation left lateral hip and upper thigh, no deformity, well healed scar from L arthroplasty, RLE nontender. 2+ pitting edema to lower shins bilaterally  Pulses:   2+ and symmetric all extremities  Skin:   Skin color, texture, turgor normal, no rashes or lesions  Neurologic:   CNII-XII intact. Normal strength and sensation throughout   Lab results: Basic Metabolic Panel:  Recent Labs  16/10/9605/11/15 1629  NA 128*  K 4.3  CL 88*  CO2 23  GLUCOSE 243*  BUN 32*  CREATININE 2.60*  CALCIUM 8.6   CBC:  Recent Labs  08/05/13 1629  WBC 11.6*  NEUTROABS 8.5*  HGB 12.7*  HCT 37.2*  MCV 85.3  PLT 445*   Coagulation:  Recent Labs  08/05/13 1629  LABPROT 13.6  INR 1.04   Other results: EKG: normal EKG, normal sinus rhythm, unchanged from previous tracings, borderline prolonged QT interval 485 ms (previously 461 ms)  Imaging results:  Dg Hip Complete Left  08/05/2013   CLINICAL DATA:  Fall.  EXAM: LEFT HIP - COMPLETE 2+ VIEW  COMPARISON:  None.  FINDINGS: No acute bony or joint abnormality. Diffuse osteopenia. Left hip replacement. Left hip prosthesis in good anatomic position.  IMPRESSION: 1. No acute abnormality. 2. Left hip replacement with good anatomic position of prosthesis.   Electronically Signed   By: Maisie Fus  Register   On: 08/05/2013 17:38   Ct Pelvis Wo Contrast  08/05/2013   CLINICAL DATA:  Status post fall.  Left hip pain.  EXAM: CT PELVIS WITHOUT CONTRAST  TECHNIQUE: Multidetector CT imaging of the pelvis was performed following the standard protocol without intravenous contrast.  COMPARISON:  Plain films earlier this same date.  FINDINGS: Bipolar left hip hemiarthroplasty is in place. The device is located. The patient has a nondisplaced fracture along the anterior cortex of the femur. No other fracture is identified. Musculature about the pelvis is unremarkable. Imaged  intrapelvic contents demonstrate no focal abnormality.  IMPRESSION: Nondisplaced periprosthetic proximal femoral fracture. No other acute abnormality.   Electronically Signed   By: Drusilla Kanner M.D.   On: 08/05/2013 18:53   Assessment & Plan by Problem: Mr. Siwek is a 63 year old man with history of HTN, DM, schizophrenia, left displaced femoral neck fracture s/p left hip hemiarthroplasty by Dr. [name] 03/31/2012 p/w left hip pain after fall x 2 days.   Nondisplaced periprosthetic proximal femoral fracture on CT pelvis 08/05/2013: previously had L hip hemiarthroplasty by Dr. [name] 03/31/2012. -ED discussed with Dr. [name] (Ortho) and Ortho to evaluate in AM -pain control with IV dilaudid 0.25 Q4 prn  -bed rest -NPO after midnight in case of procedure  Hyponatremia: Corrected Na 131, likely chronic 2/2 medications. Asymptomatic, mentating normally. -obtain serum osmolality, urine osmolality and sodium  AKI on CKD: Cr 2.6 at admission. Cr 1.39 04/15/2012. Appears pre-renal. Patient had poor PO intake today 2/2 pain per caregiver. -Minimize nephrotoxins -Recheck Cr in AM  LE edema: likely dependent edema, no heart failure symptoms (no crackles on lung exam, no shortness of breath/orthopnea). Per aide, he does not elevate LE when seated -Elevate LE, continue to monitor  Paranoid schizophrenia: chronic, stable -continue klonopin 0.25mg  BID, haldol 15 mg BID, tripletal 450mg  BID, trihexyphenidyl 2.5 mg qAM and 5mg  QHS  Hypertension: BP 121/62-163/76 in ED -Continue amlodipine 10 mg daily, atenolol 100 mg daily, hydralazine 50 TID -Hold lisinopril 10mg  daily due to elevated Cr -Continue home ASA 81mg  daily -Clonidine patch due to be changed 08/06/2013  Diabetes mellitus type 2: Glucose 243 at admission. Was not on any diabetes medications per outside facility documentation. -blood glucose monitoring 4 times daily -SSI  Chronic anemia: Hgb 12.7 at admission -CBC in AM  DVT  prophylaxis - heparin SQ TID   FEN  NS at 26ml/hr CMP in am  Diabetic diet, cont home protonix 40mg  daily, NPO after MN Vitamin B12 and calcium held  Dispo: Disposition is deferred at this time, awaiting improvement of current medical problems. Anticipated discharge in approximately  1 day(s).   The patient does have a current PCP (Nursing home physician) and does not need an [facility] hospital follow-up appointment after discharge.  The patient does not have transportation limitations that hinder transportation to clinic appointments.  Signed: Griffin Basil, MD 08/05/2013, 7:23 PM

## 2013-08-05 NOTE — ED Notes (Signed)
Dr. J at bedside 

## 2013-08-05 NOTE — ED Notes (Signed)
Pt asked to turn to right side.  Assisted pt turning, propped with pillows behind back and LLE.  Pt screamed with very minimal movement.  Is becoming anxious.

## 2013-08-05 NOTE — ED Provider Notes (Signed)
CSN: [id number]     Arrival date & time 08/05/13  1553 History   First MD Initiated Contact with Patient 08/05/13 1559     Chief Complaint  Patient presents with  . Fall     (Consider location/radiation/quality/duration/timing/severity/associated sxs/prior Treatment) Patient is a 63 y.o. male presenting with hip pain.  Hip Pain This is a new problem. The current episode started today. Pertinent negatives include no abdominal pain, chest pain, chills, coughing, fever, headaches, nausea, neck pain, rash, sore throat or vomiting. Nothing aggravates the symptoms. The treatment provided no relief.    Past Medical History  Diagnosis Date  . Hypertension   . Diabetes mellitus without complication   . Mental disorder   . Schizophrenia    Past Surgical History  Procedure Laterality Date  . Hip arthroplasty Left 03/31/2012    Procedure: ARTHROPLASTY BIPOLAR HIP;  Surgeon: Mable Paris, MD;  Location: [facility];  Service: Orthopedics;  Laterality: Left;   History reviewed. No pertinent family history. History  Substance Use Topics  . Smoking status: Never Smoker   . Smokeless tobacco: Never Used  . Alcohol Use: No     Comment: not answering    Review of Systems  Constitutional: Negative for fever and chills.  HENT: Negative for sore throat.   Eyes: Negative for pain.  Respiratory: Negative for cough and shortness of breath.   Cardiovascular: Negative for chest pain.  Gastrointestinal: Negative for nausea, vomiting and abdominal pain.  Genitourinary: Negative for dysuria and flank pain.  Musculoskeletal: Negative for back pain and neck pain.  Skin: Negative for rash.  Neurological: Negative for seizures and headaches.      Allergies  Review of patient's allergies indicates no known allergies.  Home Medications   Prior to Admission medications   Medication Sig Start Date End Date Taking? Authorizing Provider  amLODipine (NORVASC) 10 MG tablet Take 1 tablet (10 mg  total) by mouth daily. 04/18/12   Kela Millin, MD  ARIPiprazole (ABILIFY) 5 MG tablet Take 1 tablet (5 mg total) by mouth daily. 04/18/12   Kela Millin, MD  aspirin EC 325 MG tablet Take 1 tablet (325 mg total) by mouth 2 (two) times daily. 03/31/12   Jiles Harold, PA-C  atenolol (TENORMIN) 100 MG tablet Take 1 tablet (100 mg total) by mouth daily. 04/18/12   Kela Millin, MD  bisacodyl (DULCOLAX) 5 MG EC tablet Take 1 tablet (5 mg total) by mouth daily as needed. 04/18/12   Kela Millin, MD  calcium carbonate (OS-CAL - DOSED IN MG OF ELEMENTAL CALCIUM) 1250 MG tablet Take 1 tablet (500 mg of elemental calcium total) by mouth daily with breakfast. 04/18/12   Kela Millin, MD  clonazePAM (KLONOPIN) 0.5 MG tablet Take 0.5 tablets (0.25 mg total) by mouth 2 (two) times daily. 04/18/12   Kela Millin, MD  cloNIDine (CATAPRES - DOSED IN MG/24 HR) 0.3 mg/24hr Place 1 patch (0.3 mg total) onto the skin once a week. 04/18/12   Kela Millin, MD  docusate sodium 100 MG CAPS Take 100 mg by mouth 2 (two) times daily. 04/18/12   Kela Millin, MD  feeding supplement (RESOURCE BREEZE) LIQD Take 1 Container by mouth 2 (two) times daily between meals. 04/18/12   Kela Millin, MD  ferrous sulfate 325 (65 FE) MG tablet Take 1 tablet (325 mg total) by mouth 2 (two) times daily with a meal. 04/18/12   Kela Millin, MD  haloperidol (HALDOL)  10 MG tablet Take 1 tablet (10 mg total) by mouth 2 (two) times daily. 04/18/12   Kela Millin, MD  hydrALAZINE (APRESOLINE) 25 MG tablet Take 1 tablet (25 mg total) by mouth 3 (three) times daily. 04/18/12   Kela Millin, MD  LORazepam (ATIVAN) 2 MG tablet Take 1 tablet (2 mg total) by mouth every 6 (six) hours as needed for anxiety (and agitation). 04/18/12   Kela Millin, MD  ondansetron (ZOFRAN) 4 MG tablet Take 1 tablet (4 mg total) by mouth every 6 (six) hours as needed for nausea. 04/18/12   Kela Millin, MD  OXcarbazepine  (TRILEPTAL) 150 MG tablet Take 3 tablets (450 mg total) by mouth 2 (two) times daily. 04/18/12   Kela Millin, MD  oxyCODONE-acetaminophen (ROXICET) 5-325 MG per tablet Take 1-2 tablets by mouth every 4 (four) hours as needed for pain. 03/31/12   Jiles Harold, PA-C  pantoprazole (PROTONIX) 40 MG tablet Take 1 tablet (40 mg total) by mouth daily. 04/18/12   Kela Millin, MD  polyethylene glycol (MIRALAX / GLYCOLAX) packet Take 17 g by mouth daily as needed. 04/18/12   Kela Millin, MD  trihexyphenidyl (ARTANE) 5 MG tablet Take 1 tablet (5 mg total) by mouth 2 (two) times daily with a meal. 04/18/12   Adeline C Viyuoh, MD  vitamin B-12 1000 MCG tablet Take 1 tablet (1,000 mcg total) by mouth daily. 04/18/12   Kela Millin, MD  zolpidem (AMBIEN) 5 MG tablet Take 1 tablet (5 mg total) by mouth at bedtime as needed and may repeat dose one time if needed for sleep. 04/18/12   Kela Millin, MD   There were no vitals taken for this visit. Physical Exam  Constitutional: He is oriented to person, place, and time. He appears well-developed and well-nourished. No distress.  HENT:  Head: Normocephalic and atraumatic.  Eyes: Pupils are equal, round, and reactive to light.  Neck: Normal range of motion.  Cardiovascular: Normal rate and regular rhythm.   Pulmonary/Chest: Effort normal and breath sounds normal.  Abdominal: Soft. He exhibits no distension. There is no tenderness.  Musculoskeletal: Normal range of motion.       Left hip: He exhibits tenderness, bony tenderness and deformity (Externally rotated, shortened).  Neurological: He is alert and oriented to person, place, and time.  Skin: Skin is warm. He is not diaphoretic.    ED Course  Procedures (including critical care time) Labs Review Labs Reviewed - No data to display  Imaging Review No results found.   EKG Interpretation   Date/Time:  Saturday August 05 2013 16:18:13 EDT Ventricular Rate:  79 PR Interval:  200 QRS  Duration: 100 QT Interval:  423 QTC Calculation: 485 R Axis:   70 Text Interpretation:  Age not entered, assumed to be  63 years old for  purpose of ECG interpretation Sinus rhythm Probable LVH with secondary  repol abnrm Borderline prolonged QT interval No significant change since  last tracing Confirmed by Ethelda Chick  MD, SAM [phone]) on 08/05/2013 4:28:35  PM      MDM   Final diagnoses:  None   63 yo M with hx of hip arthroplasty (Dr. [name], 03/2012), who presents with L hip pain after a fall. History uncertain whether fall from yesterday or from 1-2 weeks ago.   Upon arrival, patient HDS. With evidence of external rotation of L leg, with pain upon movement. Will give pain medications as needed, plan to obtain hip  pain.   X-ray shows no evidence of acute fracture the patient still has significant tenderness. Will obtain a CT scan of his pelvis.  CT scan with evidence of a periprosthetic fracture I consulted with orthopedist on call, Dr. Ave Filterhandler, who is the surgeon that performed his hip replacement a year and half ago. Orthopedist feels this is likely a nonoperative injury to recommend to the patient come in for further physical therapy given his pain and debilitation. Consult with the hospitalist who agrees to admit the patient to the floor for continued pain management and physical therapy. The patient in the in stable condition.. Patient seen and evaluated by myself and my attending, Dr. Ethelda ChickJacubowitz.        Imagene ShellerSteve Tariyah Pendry, MD 08/05/13 [phone]

## 2013-08-05 NOTE — ED Notes (Signed)
Per , Caregiver pt fell yesterday

## 2013-08-05 NOTE — ED Notes (Signed)
[facility]  Pt to floor with RN accompanying.

## 2013-08-05 NOTE — ED Notes (Signed)
[facility] and stated that he fell about 2 weeks ago , pt is c/o left sided hip pain , per EMS pt does have some shorten and out ward rotation to the left side

## 2013-08-05 NOTE — ED Provider Notes (Signed)
History is obtained from patient's assistant at skilled nursing facility, [facility] patient fell 2 days ago injuring his left hip. He has been nonambulatory since the event. His behavior appears at baseline to [name] He complains of left hip pain which is nonradiating. On exam patient is alert follows commands. HEENT exam normocephalic atraumatic neck nontender lungs clear breath sounds heart regular in rhythm abdomen nondistended nontender left lower term is shortened and externally rotated. Pain on internal rotation of thigh. All other service a contusion abrasion or tenderness neurovascular intact. Her lites for follow simple commands. Pain is 2 through 12 grossly  [name], MD 08/05/13 1945

## 2013-08-05 NOTE — ED Notes (Signed)
Care giver can be reached:   [facility] Mohiane Talbert, [phone]

## 2013-08-06 ENCOUNTER — Encounter (HOSPITAL_COMMUNITY): Payer: Self-pay | Admitting: *Deleted

## 2013-08-06 DIAGNOSIS — I129 Hypertensive chronic kidney disease with stage 1 through stage 4 chronic kidney disease, or unspecified chronic kidney disease: Secondary | ICD-10-CM

## 2013-08-06 DIAGNOSIS — N189 Chronic kidney disease, unspecified: Secondary | ICD-10-CM

## 2013-08-06 LAB — OSMOLALITY: OSMOLALITY: 287 mosm/kg (ref 275–300)

## 2013-08-06 LAB — GLUCOSE, CAPILLARY
GLUCOSE-CAPILLARY: 179 mg/dL — AB (ref 70–99)
Glucose-Capillary: 186 mg/dL — ABNORMAL HIGH (ref 70–99)
Glucose-Capillary: 216 mg/dL — ABNORMAL HIGH (ref 70–99)

## 2013-08-06 LAB — OSMOLALITY, URINE: Osmolality, Ur: 318 mOsm/kg — ABNORMAL LOW (ref 390–1090)

## 2013-08-06 MED ORDER — MORPHINE SULFATE ER 15 MG PO TBCR: 15.0000 mg | EXTENDED_RELEASE_TABLET | Freq: Two times a day (BID) | ORAL | Status: DC

## 2013-08-06 MED ORDER — MORPHINE SULFATE ER 15 MG PO TBCR
15.0000 mg | EXTENDED_RELEASE_TABLET | Freq: Two times a day (BID) | ORAL | Status: DC
  Administered 2013-08-06 – 2013-08-08 (×5): 15 mg via ORAL
  Filled 2013-08-06 (×5): qty 1

## 2013-08-06 MED ORDER — HALOPERIDOL 1 MG PO TABS
1.0000 mg | ORAL_TABLET | Freq: Once | ORAL | Status: DC
  Filled 2013-08-06: qty 1

## 2013-08-06 MED ORDER — CLONIDINE HCL 0.3 MG/24HR TD PTWK: 0.3000 mg | MEDICATED_PATCH | TRANSDERMAL | Status: DC

## 2013-08-06 MED ORDER — CLONIDINE HCL 0.3 MG/24HR TD PTWK
0.3000 mg | MEDICATED_PATCH | TRANSDERMAL | Status: DC
  Administered 2013-08-06: 0.3 mg via TRANSDERMAL
  Filled 2013-08-06: qty 1

## 2013-08-06 MED ORDER — SODIUM CHLORIDE 0.9 % IV SOLN: INTRAVENOUS | Status: DC

## 2013-08-06 MED ORDER — HYDROCODONE-ACETAMINOPHEN 5-325 MG PO TABS
1.0000 | ORAL_TABLET | Freq: Two times a day (BID) | ORAL | Status: DC | PRN
  Administered 2013-08-07 – 2013-08-08 (×3): 1 via ORAL
  Filled 2013-08-06 (×3): qty 1

## 2013-08-06 MED ORDER — HYDROCODONE-ACETAMINOPHEN 5-325 MG PO TABS: 1.0000 | ORAL_TABLET | Freq: Three times a day (TID) | ORAL | Status: DC | PRN

## 2013-08-06 NOTE — ED Provider Notes (Signed)
I have personally seen and examined the patient.  I have discussed the plan of care with the resident.  I have reviewed the documentation on PMH/FH/Soc. History.  I have reviewed the documentation of the resident and agree.  Doug SouSam Philomena Buttermore, MD 08/06/13 [phone]

## 2013-08-06 NOTE — Discharge Summary (Signed)
Name: David Lang MRN: [id number] DOB: 11/19/1950 63 y.o. PCP: Provider Default, MD  Date of Admission: 08/05/2013  3:53 PM Date of Discharge: 08/07/2013 Attending Physician: [name], MD  Discharge Diagnosis: Principal Problem:   Periprosthetic hip fracture--nondisplaced, Left Active Problems:   Schizophrenia   Hypertension   AKI (acute kidney injury)   Type II or unspecified type diabetes mellitus without mention of complication, uncontrolled   Iron deficiency anemia   Hyponatremia  Discharge Medications:   Medication List         amLODipine 10 MG tablet  Commonly known as:  NORVASC  Take 1 tablet (10 mg total) by mouth daily.     aspirin EC 81 MG tablet  Take 81 mg by mouth daily.     atenolol 100 MG tablet  Commonly known as:  TENORMIN  Take 1 tablet (100 mg total) by mouth daily.     clonazePAM 0.5 MG tablet  Commonly known as:  KLONOPIN  Take 0.5 tablets (0.25 mg total) by mouth 2 (two) times daily.     cloNIDine 0.3 mg/24hr patch  Commonly known as:  CATAPRES - Dosed in mg/24 hr  Place 1 patch (0.3 mg total) onto the skin once a week.     cyanocobalamin 1000 MCG tablet  Take 1 tablet (1,000 mcg total) by mouth daily.     haloperidol 10 MG tablet  Commonly known as:  HALDOL  Take 15 mg by mouth 2 (two) times daily.     haloperidol 2 MG tablet  Commonly known as:  HALDOL  Take 2 mg by mouth daily as needed (for increased psychosis/hallucinations with agitation or aggression.).     hydrALAZINE 50 MG tablet  Commonly known as:  APRESOLINE  Take 50 mg by mouth 3 (three) times daily.     HYDROcodone-acetaminophen 5-325 MG per tablet  Commonly known as:  NORCO/VICODIN  Take 1 tablet by mouth every 8 (eight) hours as needed (for breakthrough pain).     lisinopril 10 MG tablet  Commonly known as:  PRINIVIL,ZESTRIL  Take 10 mg by mouth daily.     loperamide 2 MG capsule  Commonly known as:  IMODIUM  Take 2 mg by mouth 3 (three) times daily as  needed for diarrhea or loose stools.     morphine 15 MG 12 hr tablet  Commonly known as:  MS CONTIN  Take 1 tablet (15 mg total) by mouth every 12 (twelve) hours.     ondansetron 4 MG tablet  Commonly known as:  ZOFRAN  Take 1 tablet (4 mg total) by mouth every 6 (six) hours as needed for nausea.     OXcarbazepine 150 MG tablet  Commonly known as:  TRILEPTAL  Take 3 tablets (450 mg total) by mouth 2 (two) times daily.     OYSTER SHELL CALCIUM PO  Take 1 tablet by mouth daily.     pantoprazole 40 MG tablet  Commonly known as:  PROTONIX  Take 1 tablet (40 mg total) by mouth daily.     polyethylene glycol packet  Commonly known as:  MIRALAX / GLYCOLAX  Take 17 g by mouth daily as needed.     trihexyphenidyl 5 MG tablet  Commonly known as:  ARTANE  Take 2.5-5 mg by mouth See admin instructions. Take 2.5mg  by mouth in the morning and take 5mg  by mouth at bedtime.       Disposition and follow-up:   David Lang was discharged from [facility]  in Stable condition.  At the hospital follow up visit please address:  1.  Nondisplaced periprosthetic proximal femoral fracture--needs revision surgery, but needs NWB for at least 6 weeks and work with PT to maintain touchdown or nonweightbearing status. Follow up with Dr. Ave Filterhandler in office in 3 weeks; repeat xrays needed. Also, patient was put on moderate scale Novolog; we were unable to draw a HbA1c during this hospitalization, but recommend following.  2.  Labs / imaging needed at time of follow-up: xrays of hip. HbA1c. BMET--monitor renal function and sodium as we were unable to trend during hospitalization (patient refused blood draws)  3.  Pending labs/ test needing follow-up: none  Follow-up Appointments: Follow-up Information   Follow up with [facility].   Contact information:   [address] [place] [id number] [phone]      Follow up with Mable ParisHANDLER,JUSTIN WILLIAM, MD  On 08/30/2013. (10:45AM )    Specialty:  Orthopedic Surgery   Contact information:   [address] [place] [id number] [phone]       Discharge Instructions: Continue non-weight bearing physical therapy to optimize left hip healing. Dr. Ave Filterhandler will reassess in 3 weeks at your appointment on 8/5 at 10:45 am. You will get medication to achieve pain control to help you through your therapy.  Consultations: Treatment Team:  Mable ParisJustin William Chandler, MD  Procedures Performed:  Dg Hip Complete Left 08/05/2013   There were no acute abnormalities. The Left hip replacement showed good anatomic position of prosthesis.    Ct Pelvis Wo Contrast 08/05/2013   Nondisplaced periprosthetic proximal femoral fracture. No other acute abnormality.     Admission HPI: David Lang is a 63 year old man with history of HTN, DM, schizophrenia, left displaced femoral neck fracture s/p left hip hemiarthroplasty by Dr. Ave Filterhandler 03/31/2012 p/w left hip pain after fall x 2 days. Pain is nonradiating and exascerbated by movement. He denies any lightheadedness, dizziness, or palpitations prior to fall from standing. Felt like someone pushed him but no one was there. Denies hitting head or losing consciousness. He remembers the event. Per patient's caregiver who visits him at the skilled nursing facility, David Lang, patient fell 2 days ago, and his mental status is at baseline. He has been nonambulatory since the fall. He has had bilateral lower extremity swelling worse than his baseline. Endorses nonproductive cough. Denies fevers, chills, chest pain, shortness of breath, palpitations, nausea, vomiting, abdominal pain, diarrhea, dysuria, rash, or paresthesias. Constipated at baseline.  Hospital Course by problem list: Principal Problem:   Periprosthetic hip fracture--nondisplaced, Left Active Problems:   Schizophrenia   Hypertension   AKI (acute kidney injury)   Type II or unspecified type diabetes  mellitus without mention of complication, uncontrolled   Iron deficiency anemia   Hyponatremia   This 63 yo man with a nondisplaced periprosthetic proximal femoral left hip fracture s/p left hip hemiarthroplasty by Dr. Ave Filterhandler 03/2012 is here after a fall 2 days ago that has caused left hip pain. He was found on CT to have a nondisplaced crack around the implant proximally. Though Dr. Ave Filterhandler thinks he likely needs revision surgery, he rates him as a poor candidate with high risk of a dislocation complication due to behavior/mental illness. Dr. Ave Filterhandler has recommended keeping him non-weight-bearing for at least 6 weeks to see if this will function to heal and stabilize (which would avoid his need for revision surgery). He also states that he could be a candidate for surgery in an  academic center or for [place] procedure. PT and OT assessed the patient and recommend SNF with PT and 24 hour supervision. On this hospitalization, the patient has also been found to have hypotonic hyponatremia, likely due to his medications, and to have AKI, which was likely prerenal. However, unfortunately he has been refusing any IV placement or repeat blood draws, so the sodium and creatinine have not been trended.  Nondisplaced periprosthetic proximal femoral fracture on CT pelvis 08/05/2013: Pain has been well-controlled without interfering with therapy on hydrocodone/acetaminophen 5-325 mg PRN and MS Contin 15 mg every 12 hours. Will be reassessed with xrays and Dr. [name] appointment in 3 weeks following NWB PT at SNF.  Hyponatremia: Asymptomatic at Na 128 on admission (unable to trend due to patient's refusals), likely chronic due to medications. This is suggestive of hypotonic hyponatremia with inappropriately high urine osmolality of 318 and urine sodium of 20, suggesting impaired water excretion, which, in this euvolemic patient suggests that the cause is his medications.   AKI: Cr 2.6 at admission. Cr 1.39  04/15/2012. Appears pre-renal. Patient had poor PO intake prior to admission. Again, unable to reassess, as patient refusing blood draw.   Paranoid schizophrenia: Chronic and stable on klonopin 0.25mg  BID, haldol 15 mg BID, tripletal 450mg  BID, trihexyphenidyl 2.5 mg qAM and 5mg  QHS.  Pertinent labs (drawn on admission and not able to be trended): Na: 128 K: 4.3 Cl: 88 CO2: 23 BUN: 32 Cr: 2.6   Signed: Dionne Ano, MD 08/07/2013, 12:31 PM   Services Ordered on Discharge: Back to SNF with PT/OT Equipment Ordered on Discharge: none

## 2013-08-06 NOTE — Evaluation (Signed)
Physical Therapy Evaluation Patient Details Name: David HawkingFred Nida MRN: [id number] DOB: 07/07/1950 Today's Date: 08/06/2013   History of Present Illness  63 y.o. male with nondisplaced periprosthetic proximal femoral fracture on left. Hx of hemiarthroplasty in march of 2014. Additionally, has significant history of schizophrenia, HTN, and diabetes.   Clinical Impression  Pt admitted with the above diagnosis. He is currently with functional limitations due to the deficits listed below (see PT Problem List). Max assist +2 with all mobility, suspect limiting factor is patient's current mental status as he has relatively good strength and is able to sit at edge of bed without physical assistance. Able to stand for approximately 2 minutes while maintaining NWB at a min assist level but unable to pivot to chair safely. Pt was quite agitated throughout therapy session but was willing to perform tasks as requestedwith much persuasion from PT. Pt will benefit from skilled PT to increase their independence and safety with mobility to allow discharge to the venue listed below.     Follow Up Recommendations SNF;Supervision/Assistance - 24 hour    Equipment Recommendations  Wheelchair (measurements PT);Wheelchair cushion (measurements PT);Rolling walker with 5" wheels;3in1 (PT)    Recommendations for Other Services OT consult     Precautions / Restrictions Precautions Precautions: Fall Restrictions Weight Bearing Restrictions: Yes LLE Weight Bearing: Touchdown weight bearing      Mobility  Bed Mobility Overal bed mobility: Needs Assistance;+2 for physical assistance Bed Mobility: Supine to Sit     Supine to sit: Max assist;+2 for physical assistance;HOB elevated     General bed mobility comments: Requires max assist +2 for LLE support, trunk control and to scoot hips forwards with bed pad. Educated on technique and how to perform supine>sit with use of rail but pt with decreased understanding.  Demonstrates good sitting balance once upright on edge of bed.  Transfers Overall transfer level: Needs assistance Equipment used: Rolling walker (2 wheeled) Transfers: Sit to/from [employer]Stand;Stand Pivot Transfers Sit to Stand: Max assist;+2 physical assistance Stand pivot transfers: Max assist;+2 physical assistance       General transfer comment: Max assist +2 for boost to stand and pivot to chair. Pt able to sustain standing position with min guard. Unable to safely pivot without breaking weight-bearing precautions. Chair needed to be pulled under pt after attempting to turn due to inability to maintiain TDWB-NWB.  Ambulation/Gait                Stairs            Wheelchair Mobility    Modified Rankin (Stroke Patients Only)       Balance Overall balance assessment: Needs assistance Sitting-balance support: No upper extremity supported;Feet supported Sitting balance-Leahy Scale: Good     Standing balance support: Bilateral upper extremity supported Standing balance-Leahy Scale: Poor                               Pertinent Vitals/Pain Pt requests pain medication. No numerical value given Nurse notified Spoke with nurse concerning need for lift equipment Patient repositioned in chair for comfort.     Home Living Family/patient expects to be discharged to:: Skilled nursing facility   Available Help at Discharge: Skilled Nursing Facility Type of Home: Skilled Nursing Facility         Home Equipment: Other (comment) (unknown) Additional Comments: Pt unable to answer most questions concerning home living. [place]    Prior Function Level of Independence: Needs assistance         Comments: unknown, however pt was in SNF prior to admission per chart     Hand Dominance        Extremity/Trunk Assessment   Upper Extremity Assessment: Defer to OT evaluation            Lower Extremity Assessment: LLE deficits/detail   LLE Deficits / Details: Decreased strength and ROM as expected post fx - limited assessment due to pain.     Communication   Communication: Other (comment) (Heavy accent but able to speak english)  Cognition Arousal/Alertness: Awake/alert Behavior During Therapy: Restless;Agitated;Anxious Overall Cognitive Status: History of cognitive impairments - at baseline       Memory: Decreased recall of precautions;Decreased short-term memory              General Comments General comments (skin integrity, edema, etc.): Safely maintained TDWB status once standing but unable to take a step or pivot without assistance to maintain this WB status safely.    Exercises        Assessment/Plan    PT Assessment Patient needs continued PT services  PT Diagnosis Difficulty walking;Generalized weakness;Acute pain;Altered mental status   PT Problem List Decreased strength;Decreased range of motion;Decreased activity tolerance;Decreased balance;Decreased mobility;Decreased cognition;Decreased knowledge of use of DME;Decreased safety awareness;Decreased knowledge of precautions;Pain  PT Treatment Interventions DME instruction;Gait training;Functional mobility training;Therapeutic activities;Therapeutic exercise;Balance training;Neuromuscular re-education;Cognitive remediation;Patient/family education;Wheelchair mobility training;Modalities   PT Goals (Current goals can be found in the Care Plan section) Acute Rehab PT Goals Patient Stated Goal: none stated PT Goal Formulation: Patient unable to participate in goal setting Time For Goal Achievement: 08/13/13 Potential to Achieve Goals: Fair    Frequency Min 3X/week   Barriers to discharge        Co-evaluation               End of Session   Activity Tolerance: Treatment limited secondary to agitation Patient left: in chair;with call bell/phone within reach Nurse Communication:  Mobility status;Need for lift equipment;Weight bearing status;Patient requests pain meds    Functional Assessment Tool Used: clinical judgement Functional Limitation: Mobility: Walking and moving around Mobility: Walking and Moving Around Current Status [phone]): At least 60 percent but less than 80 percent impaired, limited or restricted Mobility: Walking and Moving Around Goal Status [phone]): At least 40 percent but less than 60 percent impaired, limited or restricted    Time: 1204-1238 PT Time Calculation (min): 34 min   Charges:   PT Evaluation $Initial PT Evaluation Tier I: 1 Procedure PT Treatments $Therapeutic Activity: 23-37 mins   PT G Codes:   Functional Assessment Tool Used: clinical judgement Functional Limitation: Mobility: Walking and moving around  [facility] Accident, [place] [phone]   Berton Mount 08/06/2013, 2:06 PM

## 2013-08-06 NOTE — Progress Notes (Signed)
Clinical Social Work Department BRIEF PSYCHOSOCIAL ASSESSMENT 08/06/2013  Patient:  David Lang,David Lang     Account Number:  [id number]     Admit date:  08/05/2013  Clinical Social Worker:  Hendricks MiloMORGAN,Makaria Poarch, LCSWA  Date/Time:  08/06/2013 05:08 PM  Referred by:  Physician  Date Referred:  08/06/2013 Referred for  SNF Placement   Other Referral:   Interview type:  Family Other interview type:    PSYCHOSOCIAL DATA Living Status:  FACILITY [facility] [place] Level of care:  Assisted Living Primary support name:  Diane Primary support relationship to patient:  FRIEND Degree of support available:   Good support.    CURRENT CONCERNS  Other Concerns:    SOCIAL WORK ASSESSMENT / PLAN Clinical Social Worker (CSW) received consult for SNF placement. Per chart patient is not alert and oriented. CSW contacted patient's sister Ferne Reusajwa Shammas [facility](HPOA) to discuss D/C plan. Per sister [facility] and has blue cross blue shield. Sister said [facility] insurance information in their records. Sister [place] agreeable [place]. CSW also contacted patient's other sister Melton KrebsLelia and patient's friend Diane and made them aware of above. Per sisters Diane takes care of patient and [facility].   Assessment/plan status:  Psychosocial Support/Ongoing Assessment of Needs Other assessment/ plan:   Information/referral to community resources:    PATIENT'S/FAMILY'S RESPONSE TO PLAN OF CARE: Sisters and Diane thanked CSW for calling and assisting with placement process.

## 2013-08-06 NOTE — Progress Notes (Signed)
Pt refused lab work and pulled out IV. Speaking mostly in native tongue and is difficult to understand. RN notified MD and order for haldol po received.

## 2013-08-06 NOTE — H&P (Signed)
Internal Medicine Attending Admission Note Date: 08/06/2013  Patient name: Hiren Peplinski Medical record number: [id number] Date of birth: [birth year] Age: 63 y.o. Gender: male  I saw and evaluated the patient. I reviewed the resident's note and I agree with the resident's findings and plan as documented in the resident's note.  Chief Complaint(s):  Left hip pain   History - key components related to admission:  Mr. Acy is a 63 year old man with paranoid schizophrenia, diabetes, hypertension, and previous left femoral neck fracture status post left hemiarthroplasty in March 2014 who presents with 2 days of left hip pain after a fall at his nursing facility. He states he has no pain when he is still but any time that he moves the hip or it is moved he gets pain in the hip region. The cause of the fall is unclear although likely mechanical as he had no syncopal event. Other than the hip there was no trauma to other areas of the body, especially the head. He denied any lightheadedness, dizziness, or palpitation prior to the fall. His caregiver reported to Dr. Isabella Bowens that his mental status is at baseline and that he has been nonambulatory since the fall. She also notes he's had a decreased appetite since the fall. He was admitted to the internal medicine teaching service for further evaluation and care.  On evaluation this morning he remains paranoid and is refusing any repeat blood draws. This is consistent with my previous experiences with the patient where he develops a prerenal azotemia secondary to decreased appetite for various reasons and consistently and persistently maintains he wants no further blood draws. This is not unreasonable and does not change our management, namely rehydration.   Physical Exam - key components related to admission:  Filed Vitals:   08/06/13 0212 08/06/13 0558 08/06/13 0559 08/06/13 1029  BP: 141/83 159/93 159/93 182/80  Pulse: 75 63  69  Temp: 98.3 F (36.8 C) 98 F  (36.7 C)  97.9 F (36.6 C)  TempSrc: Oral Oral  Oral  Resp: 18 20  18   SpO2: 97%   96%   General: Well-developed, well-nourished, man lying comfortably in bed in no acute distress. His demeanor is paranoid but he does answer questions appropriately.   Lab results:  Basic Metabolic Panel:  Recent Labs  40/98/11 1629  NA 128*  K 4.3  CL 88*  CO2 23  GLUCOSE 243*  BUN 32*  CREATININE 2.60*  CALCIUM 8.6   CBC:  Recent Labs  08/05/13 1629  WBC 11.6*  NEUTROABS 8.5*  HGB 12.7*  HCT 37.2*  MCV 85.3  PLT 445*   CBG:  Recent Labs  08/05/13 2350  GLUCAP 206*   Coagulation:  Recent Labs  08/05/13 1629  INR 1.04   Imaging results:  Dg Hip Complete Left  08/05/2013   CLINICAL DATA:  Fall.  EXAM: LEFT HIP - COMPLETE 2+ VIEW  COMPARISON:  None.  FINDINGS: No acute bony or joint abnormality. Diffuse osteopenia. Left hip replacement. Left hip prosthesis in good anatomic position.  IMPRESSION: 1. No acute abnormality. 2. Left hip replacement with good anatomic position of prosthesis.   Electronically Signed   By: Maisie Fus  Register   On: 08/05/2013 17:38   Ct Pelvis Wo Contrast  08/05/2013   CLINICAL DATA:  Status post fall.  Left hip pain.  EXAM: CT PELVIS WITHOUT CONTRAST  TECHNIQUE: Multidetector CT imaging of the pelvis was performed following the standard protocol without intravenous contrast.  COMPARISON:  Plain films  earlier this same date.  FINDINGS: Bipolar left hip hemiarthroplasty is in place. The device is located. The patient has a nondisplaced fracture along the anterior cortex of the femur. No other fracture is identified. Musculature about the pelvis is unremarkable. Imaged intrapelvic contents demonstrate no focal abnormality.  IMPRESSION: Nondisplaced periprosthetic proximal femoral fracture. No other acute abnormality.   Electronically Signed   By: Drusilla Kannerhomas  Dalessio M.D.   On: 08/05/2013 18:53   Other results:  EKG: Normal sinus rhythm at 79 beats per minute,  normal axis, normal intervals, no LVH, no significant Q waves, 1 mm ST depression in V5 and V6, subtle T-wave changes in V5 and V6. These ST-T wave segment changes are similar to those found on the ECG from March 2015.  Assessment & Plan by Problem:  [name]. is a 63 year old man with a history of paranoid schizophrenia, diabetes, hypertension, and previous left displaced femoral neck fracture requiring left hip hemiarthroplasty who presents with a fracture around the prosthetic that has resulted in loosening. This fracture likely occurred at the time of his fall 2 days ago. He's been evaluated by orthopedic surgery who felt a trial of conservative care would be best at this time. He has a mild hyponatremia that is isovolemic and likely related to pain and his antipsychotic medications. He also has a prerenal azotemia related to decreased oral intake over the last 2 days because of his left hip pain. Unfortunately, he is refusing IV placement or further draws necessitating empiric management.   1) Left hip fracture at the prosthetic site resulting in loosening: His pain is most likely related to this fracture that he likely sustained during his fall 2 days ago. Orthopedic surgery has evaluated and is recommending conservative management initially. Physical therapy will be assessing the patient but this can likely be continued at his skilled nursing facility. Pain management is important, and given the likely acute and chronic nature of this pain we will begin MS Contin 15 mg by mouth twice a day. This medication can be titrated in the skilled nursing facility as tolerated and required. We will also provide a short acting narcotic such as Vicodin or Percocet for breakthrough pain. He is to be reassessed by orthopedic surgery in their clinic in 3 weeks where further decisions on management will take place.  2) Hyponatremia: As noted above, this is likely secondary to his antipsychotic medications and pain.  I am very hesitant to adjust any of his antipsychotic medications given his rather significant and debilitating paranoid schizophrenia. His pain is being managed with both long-acting and as needed short acting narcotics as we assess healing over the next several weeks.  3) Prerenal azotemia: He is refusing intravenous hydration or further blood draws. We will therefore encourage  oral intake of fluids. This will also be facilitated by management of his pain as noted above. If, at sometime in the future, he is willing to have his blood drawn, success of oral rehydration can be evaluated.  4) Disposition: After his physical therapy evaluation I suspect he will be stable for discharge back to the nursing home to continue with physical therapy and titration of his pain management as we have started. Being in a familiar environment, given his paranoid schizophrenia, will help facilitate his recovery.

## 2013-08-06 NOTE — Progress Notes (Signed)
MD aware of pt refusing IV insertion.

## 2013-08-06 NOTE — Progress Notes (Addendum)
Clinical Social Work Department CLINICAL SOCIAL WORK PLACEMENT NOTE 08/06/2013  Patient:  David Lang,David Lang  Account Number:  [id number] Admit date:  08/05/2013  Clinical Social Worker:  Jetta LoutBAILEY MORGAN, Theresia MajorsLCSWA  Date/time:  08/06/2013 05:16 PM  Clinical Social Work is seeking post-discharge placement for this patient at the following level of care:   SKILLED NURSING   (*CSW will update this form in Epic as items are completed)   08/06/2013  Patient/family provided with [facility] Department of Clinical Social Work's list of facilities offering this level of care within the geographic area requested by the patient (or if unable, by the patient's family).  08/06/2013  Patient/family informed of their freedom to choose among providers that offer the needed level of care, that participate in Medicare, Medicaid or managed care program needed by the patient, have an available bed and are willing to accept the patient.  08/06/2013  Patient/family informed of [facility]' ownership interest in [facility], as well as of the fact that they are under no obligation to receive care at this facility.  PASARR submitted to EDS on 08/06/2013 PASARR number received on   FL2 transmitted to all facilities in geographic area requested by pt/family on  08/06/2013 FL2 transmitted to all facilities within larger geographic area on   Patient informed that his/her managed care company has contracts with or will negotiate with  certain facilities, including the following:     Patient/family informed of bed offers received:  ---- Patient chooses bed at [facility] Phys[facility] recommends and patient chooses bed at    Patient to be transferred to  on   Patient to be transferred to facility by  Patient and family notified of transfer on 08/08/2013 Name of family member notified:  Alric SetonLina [phone] (pt niece); MD notified Diane   The following physician request were entered in  Epic:   Additional Comments: Patient had an expired PASARR. CSW submitted change in condition. PASARR is under review.

## 2013-08-06 NOTE — Progress Notes (Signed)
Internal Medicine Attending  Date: 08/06/2013  Patient name: David HawkingFred Lang Medical record number: [id number] Date of birth: 09/16/1950 Age: 63 y.o. Gender: male  I saw and evaluated the patient. I developed the assessment and plan with the housestaff and reviewed the resident's note by Dr. Leatha GildingMallory. I agree with the resident's findings and plans as documented in her progress note.  Please see my addendum to the H&P for my assessment from earlier today.

## 2013-08-06 NOTE — Progress Notes (Signed)
PT Cancellation Note  Patient Details Name: David HawkingFred Milleson MRN: [id number] DOB: 08/09/1950   Cancelled Treatment:    Reason Eval/Treat Not Completed: Patient not medically ready  Patient currently placed on strict bed rest orders. Awaiting ortho consult. Please update physical therapy orders when medically ready for PT evaluation. Will continue to follow. Thank you for this referral.  Charlsie MerlesLogan Secor Shalayna Ornstein, PT [phone]  Berton MountBarbour, Shirlee Whitmire S 08/06/2013, 8:26 AM

## 2013-08-06 NOTE — Consult Note (Signed)
Reason for Consult: Left periprosthetic femur fracture Referring Physician: Valeria Krisko is an 63 y.o. male.  HPI: The patient is a 63 year old schizophrenic male who I performed a left hip hemiarthroplasty for fracture in March of 2014. He apparently was doing well with this until he had a fall 3 or 4 days ago. He is had pain and been unable to walk since that time. He was seen in emergency department where a CT scan revealed nondisplaced crack around the implant proximally. He complains of pain in leg and hip is annulate periods no pain at rest. No other injuries with the fall. He is currently not cooperative with nursing staff and has ripped out IVs, and refused lab draws.  Past Medical History  Diagnosis Date  . Hypertension   . Diabetes mellitus without complication   . Mental disorder   . Schizophrenia     Past Surgical History  Procedure Laterality Date  . Hip arthroplasty Left 03/31/2012    Procedure: ARTHROPLASTY BIPOLAR HIP;  Surgeon: Nita Sells, MD;  Location: [facility];  Service: Orthopedics;  Laterality: Left;    History reviewed. No pertinent family history.  Social History:  reports that he has never smoked. He has never used smokeless tobacco. He reports that he does not drink alcohol or use illicit drugs.  Allergies: No Known Allergies  Medications: I have reviewed the patient's current medications.  Results for orders placed during the hospital encounter of 08/05/13 (from the past 48 hour(s))  BASIC METABOLIC PANEL     Status: Abnormal   Collection Time    08/05/13  4:29 PM      Result Value Ref Range   Sodium 128 (*) 137 - 147 mEq/L   Potassium 4.3  3.7 - 5.3 mEq/L   Chloride 88 (*) 96 - 112 mEq/L   CO2 23  19 - 32 mEq/L   Glucose, Bld 243 (*) 70 - 99 mg/dL   BUN 32 (*) 6 - 23 mg/dL   Creatinine, Ser 2.60 (*) 0.50 - 1.35 mg/dL   Calcium 8.6  8.4 - 10.5 mg/dL   GFR calc non Af Amer 25 (*) >90 mL/min   GFR calc Af Amer 29 (*) >90 mL/min    Comment: (NOTE)     The eGFR has been calculated using the CKD EPI equation.     This calculation has not been validated in all clinical situations.     eGFR's persistently <90 mL/min signify possible Chronic Kidney     Disease.   Anion gap 17 (*) 5 - 15  CBC WITH DIFFERENTIAL     Status: Abnormal   Collection Time    08/05/13  4:29 PM      Result Value Ref Range   WBC 11.6 (*) 4.0 - 10.5 K/uL   RBC 4.36  4.22 - 5.81 MIL/uL   Hemoglobin 12.7 (*) 13.0 - 17.0 g/dL   HCT 37.2 (*) 39.0 - 52.0 %   MCV 85.3  78.0 - 100.0 fL   MCH 29.1  26.0 - 34.0 pg   MCHC 34.1  30.0 - 36.0 g/dL   RDW 12.3  11.5 - 15.5 %   Platelets 445 (*) 150 - 400 K/uL   Neutrophils Relative % 73  43 - 77 %   Neutro Abs 8.5 (*) 1.7 - 7.7 K/uL   Lymphocytes Relative 17  12 - 46 %   Lymphs Abs 2.0  0.7 - 4.0 K/uL   Monocytes Relative 8  3 -  12 %   Monocytes Absolute 0.9  0.1 - 1.0 K/uL   Eosinophils Relative 2  0 - 5 %   Eosinophils Absolute 0.2  0.0 - 0.7 K/uL   Basophils Relative 0  0 - 1 %   Basophils Absolute 0.0  0.0 - 0.1 K/uL  PROTIME-INR     Status: None   Collection Time    08/05/13  4:29 PM      Result Value Ref Range   Prothrombin Time 13.6  11.6 - 15.2 seconds   INR 1.04  0.00 - 1.49  TYPE AND SCREEN     Status: None   Collection Time    08/05/13  4:46 PM      Result Value Ref Range   ABO/RH(D) A POS     Antibody Screen NEG     Sample Expiration 08/08/2013    ABO/RH     Status: None   Collection Time    08/05/13  4:46 PM      Result Value Ref Range   ABO/RH(D) A POS    OSMOLALITY, URINE     Status: Abnormal   Collection Time    08/05/13 10:01 PM      Result Value Ref Range   Osmolality, Ur 318 (*) 390 - 1090 mOsm/kg   Comment: Performed at [facility]  SODIUM, URINE, RANDOM     Status: None   Collection Time    08/05/13 10:02 PM      Result Value Ref Range   Sodium, Ur 20    OSMOLALITY     Status: None   Collection Time    08/05/13 10:08 PM      Result Value Ref Range    Osmolality 287  275 - 300 mOsm/kg   Comment: Performed at [place], [place]     Status: Abnormal   Collection Time    08/05/13 11:50 PM      Result Value Ref Range   Glucose-Capillary 206 (*) 70 - 99 mg/dL    Dg Hip Complete Left  08/05/2013   CLINICAL DATA:  Fall.  EXAM: LEFT HIP - COMPLETE 2+ VIEW  COMPARISON:  None.  FINDINGS: No acute bony or joint abnormality. Diffuse osteopenia. Left hip replacement. Left hip prosthesis in good anatomic position.  IMPRESSION: 1. No acute abnormality. 2. Left hip replacement with good anatomic position of prosthesis.   Electronically Signed   By: Marcello Moores  Register   On: 08/05/2013 17:38   Ct Pelvis Wo Contrast  08/05/2013   CLINICAL DATA:  Status post fall.  Left hip pain.  EXAM: CT PELVIS WITHOUT CONTRAST  TECHNIQUE: Multidetector CT imaging of the pelvis was performed following the standard protocol without intravenous contrast.  COMPARISON:  Plain films earlier this same date.  FINDINGS: Bipolar left hip hemiarthroplasty is in place. The device is located. The patient has a nondisplaced fracture along the anterior cortex of the femur. No other fracture is identified. Musculature about the pelvis is unremarkable. Imaged intrapelvic contents demonstrate no focal abnormality.  IMPRESSION: Nondisplaced periprosthetic proximal femoral fracture. No other acute abnormality.   Electronically Signed   By: Inge Rise M.D.   On: 08/05/2013 18:53    Review of Systems  Unable to perform ROS: mental acuity   Blood pressure 159/93, pulse 63, temperature 98 F (36.7 C), temperature source Oral, resp. rate 20, SpO2 97.00%. Physical Exam  Constitutional: He appears well-developed and well-nourished.  HENT:  Head: Atraumatic.  Eyes: EOM are  normal.  Cardiovascular: Intact distal pulses.   Respiratory: Effort normal.  Musculoskeletal:  Pain with any log roll left hip. Incision is well-healed. No warmth erythema or swelling.   Neurological: He is alert.  Skin: Skin is warm and dry.  Psychiatric: He is agitated.    Assessment/Plan: Left hip periprosthetic femur fracture Looking at the CT scan and comparing to prior x-rays it looks like the implant may be loose. The fracture is nondisplaced at this time. He is likely to need revision surgery, however given his severe mental illness and behavioral issues, would be at very high risk of complications with dislocation etc. with conversion to total hip arthroplasty. At this point I think the best course of action would be to keep him nonweightbearing for at least 6 weeks to see if this can heal and stabilize. If it does not he may need further surgery. Given his severe coexisting conditions this may be best accomplished in an academic center. Ultimately if he is not a candidate for revision arthroplasty he may be a candidate for a Girdlestone procedure given his low functional demands. He should work with physical therapy and try and maintain touchdown or nonweightbearing status and should come back to see me in my office in approximately 3 weeks to repeat some x-rays.  Nita Sells 08/06/2013, 9:23 AM

## 2013-08-06 NOTE — Progress Notes (Signed)
Utilization Review Completed.Alaira Level T7/12/2013  

## 2013-08-06 NOTE — Progress Notes (Signed)
Subjective: Patient was very upset that we did not call prior to entering his room. He denied pain, except on movement of his leg. He was not happy with the idea of having to give blood again, despite a discussion about the importance of monitoring him for his kidney function.   Objective: Vital signs in last 24 hours: Filed Vitals:   08/06/13 0558 08/06/13 0559 08/06/13 1029 08/06/13 1328  BP: 159/93 159/93 182/80 129/70  Pulse: 63  69 60  Temp: 98 F (36.7 C)  97.9 F (36.6 C) 98 F (36.7 C)  TempSrc: Oral  Oral Oral  Resp: 20  18   SpO2:   96% 98%   Intake/Output Summary (Last 24 hours) at 08/06/13 1502 Last data filed at 08/06/13 0300  Gross per 24 hour  Intake      0 ml  Output    520 ml  Net   -520 ml   Physical Exam: General: bothered, covers pulled up to chin, exam limited by patient Cardiac: RRR, normal S1/S2, intact distal pulses Pulmonary: CTAB, no WRR Abdomen: BS+, nontender Extremities: guarding of hip during exam, tenderness to palpation of left lateral hip, well-healed scar, no erythema or swelling, 2+ pitting edema b/l Psychiatric: paranoid behavior, patient mentions need to see both of the physician's hands and for all visitors to call prior to visiting his room  Lab Results: Basic Metabolic Panel:  Recent Labs Lab 08/05/13 1629  NA 128*  K 4.3  CL 88*  CO2 23  GLUCOSE 243*  BUN 32*  CREATININE 2.60*  CALCIUM 8.6    Recent Labs Lab 08/05/13 1629  WBC 11.6*  NEUTROABS 8.5*  HGB 12.7*  HCT 37.2*  MCV 85.3  PLT 445*   Recent Labs Lab 08/05/13 2350 08/06/13 1325  GLUCAP 206* 186*   Anemia Panel: No results found for this basename: VITAMINB12, FOLATE, FERRITIN, TIBC, IRON, RETICCTPCT,  in the last 168 hours Urine Drug Screen: Drugs of Abuse     Component Value Date/Time   LABOPIA NONE DETECTED 02/27/2012 1325   COCAINSCRNUR NONE DETECTED 02/27/2012 1325   LABBENZ NONE DETECTED 02/27/2012 1325   AMPHETMU NONE DETECTED 02/27/2012 1325   THCU NONE DETECTED 02/27/2012 1325   LABBARB NONE DETECTED 02/27/2012 1325    Studies/Results: Dg Hip Complete Left  08/05/2013   CLINICAL DATA:  Fall.  EXAM: LEFT HIP - COMPLETE 2+ VIEW  COMPARISON:  None.  FINDINGS: No acute bony or joint abnormality. Diffuse osteopenia. Left hip replacement. Left hip prosthesis in good anatomic position.  IMPRESSION: 1. No acute abnormality. 2. Left hip replacement with good anatomic position of prosthesis.   Electronically Signed   By: Maisie Fus  Register   On: 08/05/2013 17:38   Ct Pelvis Wo Contrast  08/05/2013   CLINICAL DATA:  Status post fall.  Left hip pain.  EXAM: CT PELVIS WITHOUT CONTRAST  TECHNIQUE: Multidetector CT imaging of the pelvis was performed following the standard protocol without intravenous contrast.  COMPARISON:  Plain films earlier this same date.  FINDINGS: Bipolar left hip hemiarthroplasty is in place. The device is located. The patient has a nondisplaced fracture along the anterior cortex of the femur. No other fracture is identified. Musculature about the pelvis is unremarkable. Imaged intrapelvic contents demonstrate no focal abnormality.  IMPRESSION: Nondisplaced periprosthetic proximal femoral fracture. No other acute abnormality.   Electronically Signed   By: Drusilla Kanner M.D.   On: 08/05/2013 18:53   Medications: I have reviewed the patient's current medications.  Scheduled Meds: . amLODipine  10 mg Oral Daily  . aspirin EC  81 mg Oral Daily  . atenolol  100 mg Oral Daily  . clonazePAM  0.25 mg Oral BID  . haloperidol  1 mg Oral Once  . haloperidol  15 mg Oral BID  . heparin  5,000 Units Subcutaneous 3 times per day  . hydrALAZINE  50 mg Oral 3 times per day  . insulin aspart  0-5 Units Subcutaneous QHS  . insulin aspart  0-9 Units Subcutaneous TID WC  . morphine  15 mg Oral Q12H  . OXcarbazepine  450 mg Oral BID  . pantoprazole  40 mg Oral Daily  . trihexyphenidyl  2.5 mg Oral Q breakfast   And  . trihexyphenidyl  5 mg  Oral QHS   Continuous Infusions: . sodium chloride     PRN Meds:.HYDROcodone-acetaminophen Assessment/Plan: Principal Problem:   Periprosthetic hip fracture--nondisplaced, Left Active Problems:   Schizophrenia   Hypertension   AKI (acute kidney injury)   Type II or unspecified type diabetes mellitus without mention of complication, uncontrolled   Iron deficiency anemia   Hyponatremia This 63 yo man who suffered a left displaced femoral neck fracture s/p left hip hemiarthroplasty by Dr. [name] 03/2012 is here after a fall 2 days ago that has caused left hip pain. He also has hyponatremia and AKI, but unfortunately has been refusing any IV placement or repeat blood draws.   Nondisplaced periprosthetic proximal femoral fracture on CT pelvis 08/05/2013: previously had L hip hemiarthroplasty by Dr. [name] 03/31/2012  -  Appreciate orthopaedics consult: implant may be loose at the site of the left hip periprosthetic femur, likely will need revision surgery, but his high risk of complications with dislocation due to behavior/mental illness make him a poor candidate. Recommend keeping him non-weight-bearing for at least 6 weeks to see if this will function to heal and stabilize (could avoid need for further surgery). Could be candidate for surgery in academic center or for Girdlestone procedure - Return to Dr. [name] 3 weeks for xrays - PT evaluated and recommends SNF with 24 hour supervision - Pain control with hydrocodone/acetaminophen 5-325 mg PRN and MS Contin 15 mg every 12 hours  Hyponatremia: Na 128, likely chronic 2/2 medications. Asymptomatic, mentating normally  - Hypotonic hyponatremia with inappropriately high urine osmolality of 318 and urine sodium of 20 suggests impaired water excretion, which, in this euvolemic patient suggests that the cause is medications  AKI on CKD: Cr 2.6 at admission. Cr 1.39 04/15/2012. Appears pre-renal. Patient had poor PO intake today 2/2 pain per  caregiver  - Unable to reassess, patient refusing blood draw  LE edema: likely dependent edema, no heart failure symptoms (no crackles on lung exam, no shortness of breath/orthopnea). Per aide, he does not elevate LE when seated  - Elevate LE, continue to monitor   Paranoid schizophrenia: chronic, stable  - Continue klonopin 0.25mg  BID, haldol 15 mg BID, tripletal 450mg  BID, trihexyphenidyl 2.5 mg qAM and 5mg  QHS   Hypertension: BP 121/62-163/76 in ED  - Continue amlodipine 10 mg daily, atenolol 100 mg daily, hydralazine 50 TID  - Hold lisinopril 10mg  daily due to elevated Cr  - Continue home ASA 81mg  daily  - Clonidine patch due to be changed 08/06/2013   Diabetes mellitus type 2: Glucose 243 at admission. Was not on any diabetes medications per outside facility documentation.  - Blood glucose monitoring 4 times daily; patient is allowing finger sticks - SSI  Chronic anemia:  Hgb 12.7 at admission  - No further testing has been possible due to patient refusals  DVT prophylaxis - heparin SQ TID   FEN  - NS at 3675ml/hr  - Diabetic diet, cont home protonix 40mg  daily, NPO after MN - Vitamin B12 and calcium held  Dispo: Disposition is deferred at this time, awaiting improvement of current medical problems.  Anticipated discharge in approximately 1 day(s). Pending SNF placement. SW was able to speak with the patient's sister today.  The patient does not have a current PCP (Provider Default, MD) and [facility].  The patient does have transportation limitations that hinder transportation to clinic appointments.  .Services Needed at time of discharge: Y = Yes, Blank = No PT:   OT:   RN:   Equipment:   Other:     LOS: 1 day   Dionne AnoJulia Caylie Sandquist, MD 08/06/2013, 3:02 PM

## 2013-08-07 DIAGNOSIS — E871 Hypo-osmolality and hyponatremia: Secondary | ICD-10-CM

## 2013-08-07 DIAGNOSIS — IMO0001 Reserved for inherently not codable concepts without codable children: Secondary | ICD-10-CM

## 2013-08-07 DIAGNOSIS — E1165 Type 2 diabetes mellitus with hyperglycemia: Secondary | ICD-10-CM

## 2013-08-07 DIAGNOSIS — R609 Edema, unspecified: Secondary | ICD-10-CM

## 2013-08-07 LAB — GLUCOSE, CAPILLARY
GLUCOSE-CAPILLARY: 200 mg/dL — AB (ref 70–99)
GLUCOSE-CAPILLARY: 170 mg/dL — AB (ref 70–99)
GLUCOSE-CAPILLARY: 134 mg/dL — AB (ref 70–99)
Glucose-Capillary: 160 mg/dL — ABNORMAL HIGH (ref 70–99)

## 2013-08-07 NOTE — Discharge Instructions (Signed)
Continue non-weight bearing physical therapy to optimize left hip healing. Dr. Ave Filterhandler will reassess in 3 weeks at your appointment on 8/5 at 10:45 am. You will get medication to achieve pain control to help you through your therapy.

## 2013-08-07 NOTE — Progress Notes (Signed)
Patient has been yelling through out the evening wanting the closet door opened and closed; c/o wanting his pants so he can get up on his own.  Constant request for attention.  He is quiet as long as someone stays in the room with him but when we leave he starts yelling loudly and being disruptive.  Pain medicine has been provided and is not due at this time.

## 2013-08-07 NOTE — Progress Notes (Signed)
Physical Therapy Treatment Patient Details Name: Jacquelin HawkingFred Dumont MRN: [id number] DOB: 08/28/1950 Today's Date: 08/07/2013    History of Present Illness 63 y.o. male with nondisplaced periprosthetic proximal femoral fracture on left. Hx of hemiarthroplasty in march of 2014. Additionally, has significant history of schizophrenia, HTN, and diabetes.    PT Comments    Patient progressing towards physical therapy goals. Able to perform sit to stand and stand-pivot transfers this afternoon with mod-max assist +2. Verbalizes understanding of weight-bearing status on LLE but needs some assistance to safely maintain during pivot. He has been willing to work with PT on both visits and is glad to be out of the bed so he can eat lunch in the chair. Patient will continue to benefit from skilled physical therapy services at SNF to further improve independence with functional mobility.   Follow Up Recommendations  SNF;Supervision/Assistance - 24 hour     Equipment Recommendations  Wheelchair (measurements PT);Wheelchair cushion (measurements PT);Rolling walker with 5" wheels;3in1 (PT)    Recommendations for Other Services OT consult     Precautions / Restrictions Precautions Precautions: Fall Restrictions Weight Bearing Restrictions: Yes LLE Weight Bearing: Touchdown weight bearing    Mobility  Bed Mobility Overal bed mobility: Needs Assistance;+2 for physical assistance Bed Mobility: Supine to Sit     Supine to sit: +2 for physical assistance;HOB elevated;Mod assist     General bed mobility comments: Mod assist for LLE support off of bed, Second person for trunk facilitation. Pt able to pull up with right arm using therapists hand and then side of bed. VC to facilitate and for instructions.  Transfers Overall transfer level: Needs assistance Equipment used: Rolling walker (2 wheeled) Transfers: Sit to/from Stand Sit to Stand: Max assist;+2 physical assistance Stand pivot transfers: +2  physical assistance;Mod assist       General transfer comment: Max assist +2 for boost to stand and Mod assist +2 with pivot to chair towards patients right side. Max verbal cues for pivot, educated on DME use and to use UEs to decrease load bearing on RLE. Pt requires physical assist to maintain touch down weight-bearing status. He was able to pivot today but with great effort on his part. Reports primarily arm fatigue during transfer.  Ambulation/Gait                 Stairs            Wheelchair Mobility    Modified Rankin (Stroke Patients Only)       Balance                                    Cognition Arousal/Alertness: Awake/alert Behavior During Therapy: Restless;Agitated;Anxious Overall Cognitive Status: History of cognitive impairments - at baseline       Memory: Decreased recall of precautions;Decreased short-term memory              Exercises General Exercises - Lower Extremity Ankle Circles/Pumps: AAROM;Both;10 reps;Seated    General Comments        Pertinent Vitals/Pain Pt reports "some pain." States it is worse with movement. Lift pad left in chair for nursing transfer back to bed. Patient repositioned in chair for comfort.     Home Living                      Prior Function            PT  Goals (current goals can now be found in the care plan section) Acute Rehab PT Goals PT Goal Formulation: Patient unable to participate in goal setting Time For Goal Achievement: 08/13/13 Potential to Achieve Goals: Fair Progress towards PT goals: Progressing toward goals    Frequency  Min 3X/week    PT Plan Current plan remains appropriate    Co-evaluation             End of Session Equipment Utilized During Treatment: Gait belt Activity Tolerance: Patient tolerated treatment well Patient left: in chair;with call bell/phone within reach     Time: 1412-1433 PT Time Calculation (min): 21 min  Charges:   $Therapeutic Activity: 8-22 mins                    G Codes:      Charlsie Merles, [place] [phone]  Berton Mount 08/07/2013, 3:37 PM

## 2013-08-07 NOTE — Progress Notes (Signed)
Subjective: Mr. Heart states that he did not sleep well, but remains comfortable. He is only in left hip pain when he moves his left leg. We discussed leaving later today for a facility where he can get physical therapy on a regular basis. He is concerned about his pain during therapy, but denies pain during PT yesterday.  Events: patient did not sleep very much overnight - constantly asking nursing to come into his room to sit with him and yelling when left alone.  Objective: Vital signs in last 24 hours: Filed Vitals:   08/06/13 1029 08/06/13 1328 08/06/13 1942 08/07/13 0533  BP: 182/80 129/70 143/74 152/63  Pulse: 69 60 63 62  Temp: 97.9 F (36.6 C) 98 F (36.7 C) 98.2 F (36.8 C) 97.7 F (36.5 C)  TempSrc: Oral Oral Oral Oral  Resp: 18  20 20   SpO2: 96% 98% 97% 95%    Intake/Output Summary (Last 24 hours) at 08/07/13 1052 Last data filed at 08/07/13 0920  Gross per 24 hour  Intake      0 ml  Output    650 ml  Net   -650 ml   Physical Exam: General:on and off the phone with his sister and assistant this morning, comfortable in bed, asking for his feet, which are under the covers, to be tucked in Cardiac: RRR, normal S1/S2, intact distal pulses Pulmonary: CTAB, no WRR Abdomen: BS+, nontender Extremities: guarding of hip during exam, tenderness to palpation of left lateral hip, well-healed scar, no erythema or swelling, 2+ pitting edema b/l Psychiatric: paranoid behavior previously during this hospitalization (asking doctors to call ahead of time, asking to see doctor's hands in clear view during interview)  Prior Lab Results: No new labs (refusing blood draws) Basic Metabolic Panel:  Recent Labs Lab 08/05/13 1629  NA 128*  K 4.3  CL 88*  CO2 23  GLUCOSE 243*  BUN 32*  CREATININE 2.60*  CALCIUM 8.6    Recent Labs Lab 08/05/13 1629  WBC 11.6*  NEUTROABS 8.5*  HGB 12.7*  HCT 37.2*  MCV 85.3  PLT 445*    Recent Labs Lab 08/05/13 2350 08/06/13 1325  08/06/13 1619 08/06/13 2334 08/07/13 0646  GLUCAP 206* 186* 179* 216* 200*   Studies/Results: 08/05/13: Dg Hip Complete Left: No acute abnormality, left hip replacement with good anatomic position of prosthesis.  08/05/13: CT Pelvis WO Contrast: Nondisplaced periprosthetic proximal femoral fracture. No other acute abnormalities.  Medications: I have reviewed the patient's current medications. Scheduled Meds: . amLODipine  10 mg Oral Daily  . aspirin EC  81 mg Oral Daily  . atenolol  100 mg Oral Daily  . clonazePAM  0.25 mg Oral BID  . cloNIDine  0.3 mg Transdermal Weekly  . haloperidol  1 mg Oral Once  . haloperidol  15 mg Oral BID  . heparin  5,000 Units Subcutaneous 3 times per day  . hydrALAZINE  50 mg Oral 3 times per day  . insulin aspart  0-5 Units Subcutaneous QHS  . insulin aspart  0-9 Units Subcutaneous TID WC  . morphine  15 mg Oral Q12H  . OXcarbazepine  450 mg Oral BID  . pantoprazole  40 mg Oral Daily  . trihexyphenidyl  2.5 mg Oral Q breakfast   And  . trihexyphenidyl  5 mg Oral QHS   Continuous Infusions: . sodium chloride     PRN Meds:.HYDROcodone-acetaminophen  Assessment/Plan: Principal Problem:   Periprosthetic hip fracture--nondisplaced, Left Active Problems:   Schizophrenia  Hypertension   AKI (acute kidney injury)   Type II or unspecified type diabetes mellitus without mention of complication, uncontrolled   Iron deficiency anemia   Hyponatremia This 63 yo man with a nondisplaced periprosthetic proximal femoral left hip fracture s/p left hip hemiarthroplasty by Dr. [name] 03/2012 is here after a fall 2 days ago that has caused left hip pain. He was found on CT to have a nondisplaced crack around the implant proximally. Though Dr. [name] thinks he likely needs revision surgery, he rates him as a poor candidate with high risk of a dislocation complication due to behavior/mental illness. Dr. [name] has recommended keeping him non-weight-bearing for  at least 6 weeks to see if this will function to heal and stabilize (which would avoid his need for revision surgery). He also states that he could be a candidate for surgery in an academic center or for Girdlestone procedure. PT assessed the patient and recommends SNF with PT and 24 hour supervision. On this hospitalization, the patient has also been found to have hypotonic hyponatremia, likely due to his medications, and to have AKI, which was likely prerenal. However, unfortunately he has been refusing any IV placement or repeat blood draws, so the sodium and creatinine have not been trended.  Nondisplaced periprosthetic proximal femoral fracture on CT pelvis 08/05/2013:  - Return to Dr. [name] 3 weeks for xrays - Pain has been well-controlled without interfering with PT: hydrocodone/acetaminophen 5-325 mg PRN and MS Contin 15 mg every 12 hours  Hyponatremia: Asymptomatic at Na 128 on admission (unable to trend due to patient's refusals), likely chronic 2/2 medications. This is suggestive of hypotonic hyponatremia with inappropriately high urine osmolality of 318 and urine sodium of 20, suggesting impaired water excretion, which, in this euvolemic patient suggests that the cause is his medications.  AKI: Cr 2.6 at admission. Cr 1.39 04/15/2012. Appears pre-renal. Patient had poor PO intake prior to admission. Again, we are unable to reassess, as patient refusing blood draw.  LE edema: likely dependent edema, no heart failure symptoms (no crackles on lung exam, no shortness of breath/orthopnea). Per aide, he does not elevate LE when seated   Paranoid schizophrenia: Chronic and stable on klonopin 0.25mg  BID, haldol 15 mg BID, tripletal 450mg  BID, trihexyphenidyl 2.5 mg qAM and 5mg  QHS.  Hypertension: BP up to 182/80 in the last 24 hours, as patient is maintained on home meds (amlodipine 10 mg daily, atenolol 100 mg daily, hydralazine 50 TID, clonidine patch (replaced on 7/12) - Holding lisinopril  10mg  daily due to elevated Cr   Diabetes mellitus type 2: Glucose 170-220 in last 24 hours. Was not on any diabetes medications per outside facility, SSI here.  Chronic anemia: Hgb 12.7 at admission, but no further testing has been possible due to patient refusals.  DVT prophylaxis - heparin SQ TID   FEN  - IVF ordered, but unable to replace IV that the patient removed. - Diabetic diet, cont home protonix 40mg  daily - Vitamin B12 and calcium held  Dispo: Disposition is deferred at this time, awaiting improvement of current medical problems.  Anticipated discharge in approximately 1 day(s). Pending SNF placement. SW was able to speak with the patient's sister today. I spoke with patient's assistant today, who will be with the patient during his transition to SNF.  The patient does not have a current PCP (Provider Default, MD) and does need an [facility] hospital follow-up appointment after discharge.  The patient does have transportation limitations that hinder transportation to clinic  appointments.  .Services Needed at time of discharge: Y = Yes, Blank = No PT:   OT:   RN:   Equipment:   Other:     LOS: 2 days   Dionne AnoJulia Xiadani Damman, MD 08/07/2013, 10:52 AM

## 2013-08-07 NOTE — Progress Notes (Signed)
Internal Medicine Attending  Date: 08/07/2013  Patient name: David Lang Medical record number: [id number] Date of birth: 06/03/1950 Age: 63 y.o. Gender: male  I saw and evaluated the patient. I developed the assessment and plan with the housestaff and reviewed the resident's note by Dr. Leatha Lang.  I agree with the resident's findings and plans as documented in her progress note.  [name] seemed more engaged and cooperative during the interview this morning.  He apparently has been working well with Physical Therapy.  We are continuing to work on pain control.  Anticipated transfer to SNF tomorrow.

## 2013-08-07 NOTE — Progress Notes (Addendum)
CSW (Clinical Child psychotherapistocial Worker) called and Data processing managerleft voicemail for [facility] to inquire if pt has insurance and obtain information. Awaiting return phone call to confirm.  ADDENDUM 3:20pm: CSW has spoken with Gershon CullPriscilla at facility multiple times as well as pt sister. At [facility] but do have reservations about pt participating in therapy given mental hx. They have agreed to have pt dc back tomorrow and receive therapy at their facility. Pt sister also in agreement to this plan as it is currently unknown if pt has an Magazine features editorinsurance payor source and if SNF will be local.  Minahil Quinlivan, LCSWA [phone]

## 2013-08-07 NOTE — Evaluation (Signed)
Occupational Therapy Evaluation and Discharge Patient Details Name: David HawkingFred Lang MRN: [id number] DOB: 01/15/1951 Today's Date: 08/07/2013    History of Present Illness 63 y.o. male with nondisplaced periprosthetic proximal femoral fracture on left. Hx of hemiarthroplasty in march of 2014. Additionally, has significant history of schizophrenia, HTN, and diabetes.   Clinical Impression   This 63 yo male admitted with above and presents to acute OT with increased pain, decreased mobility, TTWB on LLE, and decreased cognition all affecting pt's ability to care for himself and increasing burden of care on staff at facility. Pt will benefit from continued OT at SNF. Acute OT will sign off.    Follow Up Recommendations  SNF    Equipment Recommendations   (TBD at next venue)       Precautions / Restrictions Precautions Precautions: Fall Restrictions Weight Bearing Restrictions: Yes LLE Weight Bearing: Touchdown weight bearing      Mobility Bed Mobility Overal bed mobility: Needs Assistance Bed Mobility: Rolling Rolling:  (max A to right, min A to left)                    ADL Overall ADL's : Needs assistance/impaired Eating/Feeding: Set up (supine HOB up)   Grooming: Set up;Supervision/safety (supine HOB up)   Upper Body Bathing: Set up (supine HOB up)   Lower Body Bathing: Total assistance (HOB up and down, roll right /left)   Upper Body Dressing : Maximal assistance (supine HOB up and roll right and left)   Lower Body Dressing: Total assistance (rolling right and left in bed)   Toilet Transfer:  (Not safe to attempt without +2 A)                             Pertinent Vitals/Pain 8/10 in LLE; repositioned and made RN aware.     Hand Dominance  Right   Extremity/Trunk Assessment Upper Extremity Assessment Upper Extremity Assessment: Defer to OT evaluation           Communication  strong accent   Cognition Arousal/Alertness:  Awake/alert Behavior During Therapy: Restless;Anxious Overall Cognitive Status: History of cognitive impairments - at baseline       Memory: Decreased recall of precautions;Decreased short-term memory                        Home Living Family/patient expects to be discharged to:: Skilled nursing facility                                             OT Diagnosis: Generalized weakness;Cognitive deficits;Acute pain   OT Problem List: Decreased strength;Decreased range of motion;Impaired balance (sitting and/or standing);Decreased cognition;Pain      OT Goals(Current goals can be found in the care plan section) Acute Rehab OT Goals Patient Stated Goal: to know if [facility] will be [facility]  OT Frequency:                End of Session Nurse Communication: Patient requests pain meds  Activity Tolerance: Patient limited by pain Patient left: in bed;with call bell/phone within reach   Time: [id number] OT Time Calculation (min): 37 min Charges:  OT General Charges $OT Visit: 1 Procedure OT Treatments $Self Care/Home Management : 23-37 mins  Evette GeorgesLeonard, David Lang David Lang [phone] 08/07/2013, 9:43 AM

## 2013-08-07 NOTE — Progress Notes (Signed)
Inpatient Diabetes Program Recommendations  AACE/ADA: New Consensus Statement on Inpatient Glycemic Control (2013)  Target Ranges:  Prepandial:   less than 140 mg/dL      Peak postprandial:   less than 180 mg/dL (1-2 hours)      Critically ill patients:  140 - 180 mg/dL   Results for David Lang, David Lang (MRN [id number]) as of 08/07/2013 12:30  Ref. Range 03/22/2012 11:34  Hemoglobin A1C Latest Range: <5.7 % 8.8 (H)   Results for David Lang, David Lang (MRN [id number]) as of 08/07/2013 12:30  Ref. Range 08/05/2013 23:50 08/06/2013 13:25 08/06/2013 16:19 08/06/2013 23:34 08/07/2013 06:46 08/07/2013 11:07  Glucose-Capillary Latest Range: 70-99 mg/dL 132206 (H) 440186 (H) 102179 (H) 216 (H) 200 (H) 170 (H)   Diabetes history: DM2 Outpatient Diabetes medications: None Current orders for Inpatient glycemic control: Novolog 0-9 units AC, Novolog 0-5 units HS  Inpatient Diabetes Program Recommendations Correction (SSI): Please consider increasing Novolog correction to moderate scale. HgbA1C: Please consider adding an A1C on the blood in lab to evaluate glycemic control over the past 2-3 months.  Thanks, David PennerMarie Rudine Rieger, RN, MSN, CCRN Diabetes Coordinator Inpatient Diabetes Program [phone] (Team Pager) [phone] (AP office) [phone] [facility] office)

## 2013-08-08 DIAGNOSIS — D649 Anemia, unspecified: Secondary | ICD-10-CM

## 2013-08-08 DIAGNOSIS — N179 Acute kidney failure, unspecified: Secondary | ICD-10-CM

## 2013-08-08 DIAGNOSIS — Y849 Medical procedure, unspecified as the cause of abnormal reaction of the patient, or of later complication, without mention of misadventure at the time of the procedure: Secondary | ICD-10-CM | POA: Diagnosis not present

## 2013-08-08 DIAGNOSIS — Z96649 Presence of unspecified artificial hip joint: Secondary | ICD-10-CM

## 2013-08-08 DIAGNOSIS — T84049A Periprosthetic fracture around unspecified internal prosthetic joint, initial encounter: Secondary | ICD-10-CM | POA: Diagnosis not present

## 2013-08-08 DIAGNOSIS — W19XXXA Unspecified fall, initial encounter: Secondary | ICD-10-CM | POA: Diagnosis not present

## 2013-08-08 DIAGNOSIS — I10 Essential (primary) hypertension: Secondary | ICD-10-CM

## 2013-08-08 DIAGNOSIS — E119 Type 2 diabetes mellitus without complications: Secondary | ICD-10-CM

## 2013-08-08 DIAGNOSIS — K8309 Other cholangitis: Secondary | ICD-10-CM

## 2013-08-08 DIAGNOSIS — Z9660 Presence of unspecified orthopedic joint implant: Principal | ICD-10-CM

## 2013-08-08 DIAGNOSIS — F200 Paranoid schizophrenia: Secondary | ICD-10-CM

## 2013-08-08 LAB — GLUCOSE, CAPILLARY
GLUCOSE-CAPILLARY: 166 mg/dL — AB (ref 70–99)
Glucose-Capillary: 139 mg/dL — ABNORMAL HIGH (ref 70–99)

## 2013-08-08 NOTE — Progress Notes (Signed)
Internal Medicine Attending  Date: 08/08/2013  Patient name: David Lang Medical record number: [id number] Date of birth: 03/04/1950 Age: 63 y.o. Gender: male  I saw and evaluated the patient. I developed the assessment and plan with the housestaff and reviewed the resident's note by Dr. Leatha GildingMallory.  I agree with the resident's findings and plans as documented in her progress note.  His demeanor remains uncharged today.  None-the-less, he has remained cooperative with PT and is interested in returning to his ALF.  Transfer is scheduled today for 1:30 and I agree with this plan.

## 2013-08-08 NOTE — Progress Notes (Addendum)
CSW (Clinical Social Worker) faxed Gershon CullPriscilla with [facility] pt dc paperwork. CSW prepared pt dc packet and placed with shadow chart. CSW arranged non-emergent ambulance transport for 1:30pm. Pt, pt niece Alric SetonLina, pt nurse, and facility informed. CSW signing off.  Fernando Stoiber, LCSWA [phone]

## 2013-08-08 NOTE — Progress Notes (Signed)
Subjective: David Lang endorses that he is ready to go home. He again brings up his concern about pain and his need to call his family.   Events: overnight, nursing again reports a lot of yelling from David Lang. No complaints of pain.  Objective: Vital signs in last 24 hours: Filed Vitals:   08/07/13 1355 08/07/13 1525 08/07/13 2052 08/08/13 0444  BP: 122/65 122/65 139/76 142/69  Pulse: 56  84 68  Temp: 98 F (36.7 C)  97.6 F (36.4 C) 98.2 F (36.8 C)  TempSrc:      Resp:    17  SpO2: 95%  90% 97%    Intake/Output Summary (Last 24 hours) at 08/08/13 1015 Last data filed at 08/08/13 0530  Gross per 24 hour  Intake      0 ml  Output    450 ml  Net   -450 ml   Physical Exam: General:eating breakfast in bed with no covers Cardiac: RRR, normal S1/S2, intact distal pulses Pulmonary: CTAB, no WRR Abdomen: BS+, nontender Extremities: guarding of hip during exam, tenderness to palpation of left lateral hip, well-healed scar, no erythema or swelling, 2+ pitting edema b/l Psychiatric: paranoid behavior previously during this hospitalization, constant yelling when medical personnel leave the room, yelling overnight  Prior Lab Results: No new labs (refusing blood draws) Basic Metabolic Panel:  Recent Labs Lab 08/05/13 1629  NA 128*  K 4.3  CL 88*  CO2 23  GLUCOSE 243*  BUN 32*  CREATININE 2.60*  CALCIUM 8.6    Recent Labs Lab 08/05/13 1629  WBC 11.6*  NEUTROABS 8.5*  HGB 12.7*  HCT 37.2*  MCV 85.3  PLT 445*    Recent Labs Lab 08/06/13 2334 08/07/13 0646 08/07/13 1107 08/07/13 1556 08/07/13 2153 08/08/13 0707  GLUCAP 216* 200* 170* 160* 134* 139*   Studies/Results: 08/05/13: Dg Hip Complete Left: No acute abnormality, left hip replacement with good anatomic position of prosthesis.  08/05/13: CT Pelvis WO Contrast: Nondisplaced periprosthetic proximal femoral fracture. No other acute abnormalities.  Medications: I have reviewed the patient's current  medications. Scheduled Meds: . amLODipine  10 mg Oral Daily  . aspirin EC  81 mg Oral Daily  . atenolol  100 mg Oral Daily  . clonazePAM  0.25 mg Oral BID  . cloNIDine  0.3 mg Transdermal Weekly  . haloperidol  1 mg Oral Once  . haloperidol  15 mg Oral BID  . heparin  5,000 Units Subcutaneous 3 times per day  . hydrALAZINE  50 mg Oral 3 times per day  . insulin aspart  0-5 Units Subcutaneous QHS  . insulin aspart  0-9 Units Subcutaneous TID WC  . morphine  15 mg Oral Q12H  . OXcarbazepine  450 mg Oral BID  . pantoprazole  40 mg Oral Daily  . trihexyphenidyl  2.5 mg Oral Q breakfast   And  . trihexyphenidyl  5 mg Oral QHS   Continuous Infusions: . sodium chloride     PRN Meds:.HYDROcodone-acetaminophen  Assessment/Plan: Principal Problem:   Periprosthetic hip fracture--nondisplaced, Left Active Problems:   Schizophrenia   Hypertension   AKI (acute kidney injury)   Type II or unspecified type diabetes mellitus without mention of complication, uncontrolled   Iron deficiency anemia   Hyponatremia  This 63 yo man with a nondisplaced periprosthetic proximal femoral left hip fracture s/p left hip hemiarthroplasty by Dr. Ave Filterhandler 03/2012 is here after a fall that caused left hip pain. He was found on CT to have  a nondisplaced crack around the implant proximally. Though Dr. [name] thinks he likely needs revision surgery, he rates him as a poor candidate with high risk of a dislocation complication due to the challenge of his behavior/mental illness. Dr. [name] has recommended keeping him non-weight-bearing for at least 6 weeks to see if this will function to heal and stabilize (which would avoid his need for revision surgery). He also states that he could be a candidate for surgery in an academic center or for Girdlestone procedure. PT and OT assessed the patient and recommend SNF with PT and 24 hour supervision. However, his assisted living facility, [facility], and his sister  agree that his return there is preferable; he will be able to receive PT there on a regular basis.   Nondisplaced periprosthetic proximal femoral fracture on CT pelvis 08/05/2013:  - Appointment on 8/5 10:45AM with Dr. [name] for follow up and xrays - Pain has been well-controlled without interfering with PT: hydrocodone/acetaminophen 5-325 mg PRN and MS Contin 15 mg every 12 hours - Spoke at length with the patient's family member, who is a physician (at the request of SW); she has some concerns about the orthopaedics / PT plan and would like to discuss with Dr. [name]; she is aware of the challenges of both repeat surgery and of waiting for reassessment with PT  Hyponatremia: Asymptomatic at Na 128 on admission; unable to trend due to patient refusal for blood draws. No changes in last 24 hours  AKI: Cr 2.6 at admission; unable to trend due to patient refusal for blood draws. No changes in last 24 hours  Paranoid schizophrenia: Chronic and stable on klonopin 0.25mg  BID, haldol 15 mg BID, tripletal 450mg  BID, trihexyphenidyl 2.5 mg qAM and 5mg  QHS; no changes in last 24 hours  Hypertension: BP better controlled over last 24 hours; to resume home meds on discharge  Diabetes mellitus type 2: Glucose 134-200; no changes  Chronic anemia: Hgb 12.7 at admission; no changes in last 24 hours  DVT prophylaxis - heparin SQ TID   Dispo: Disposition is deferred at this time, awaiting improvement of current medical problems.  Anticipated discharge in approximately 0 day(s). Patient going back to [facility] today at 1:00 or 1:30. Message left for his assistant.  The patient does not have a current PCP (Provider Default, MD) and does need an [facility] hospital follow-up appointment after discharge.  The patient does have transportation limitations that hinder transportation to clinic appointments.  .Services Needed at time of discharge: Y = Yes, Blank = No PT:   OT:   RN:   Equipment:   Other:       LOS: 3 days   Dionne Ano, MD 08/08/2013, 10:15 AM

## 2013-08-08 NOTE — Progress Notes (Signed)
[facility] Report called to RN at facility. IV was removed. Patient left unit in a stable condition with all personal belongings via EMS.

## 2013-08-11 ENCOUNTER — Emergency Department (HOSPITAL_COMMUNITY): Payer: BC Managed Care – PPO

## 2013-08-11 ENCOUNTER — Observation Stay (HOSPITAL_COMMUNITY)
Admission: EM | Admit: 2013-08-11 | Discharge: 2013-08-17 | Disposition: A | Discharge status: Home / Self Care | Payer: BC Managed Care – PPO | Attending: Internal Medicine | Admitting: Internal Medicine

## 2013-08-11 ENCOUNTER — Encounter (HOSPITAL_COMMUNITY): Payer: Self-pay | Admitting: Emergency Medicine

## 2013-08-11 DIAGNOSIS — M7989 Other specified soft tissue disorders: Secondary | ICD-10-CM

## 2013-08-11 DIAGNOSIS — Z9660 Presence of unspecified orthopedic joint implant: Secondary | ICD-10-CM

## 2013-08-11 DIAGNOSIS — T84049A Periprosthetic fracture around unspecified internal prosthetic joint, initial encounter: Secondary | ICD-10-CM

## 2013-08-11 DIAGNOSIS — E1165 Type 2 diabetes mellitus with hyperglycemia: Secondary | ICD-10-CM

## 2013-08-11 DIAGNOSIS — N183 Chronic kidney disease, stage 3 unspecified: Secondary | ICD-10-CM | POA: Diagnosis not present

## 2013-08-11 DIAGNOSIS — I6350 Cerebral infarction due to unspecified occlusion or stenosis of unspecified cerebral artery: Secondary | ICD-10-CM | POA: Diagnosis not present

## 2013-08-11 DIAGNOSIS — M978XXD Periprosthetic fracture around other internal prosthetic joint, subsequent encounter: Secondary | ICD-10-CM

## 2013-08-11 DIAGNOSIS — N179 Acute kidney failure, unspecified: Secondary | ICD-10-CM | POA: Diagnosis not present

## 2013-08-11 DIAGNOSIS — I129 Hypertensive chronic kidney disease with stage 1 through stage 4 chronic kidney disease, or unspecified chronic kidney disease: Secondary | ICD-10-CM | POA: Insufficient documentation

## 2013-08-11 DIAGNOSIS — R6889 Other general symptoms and signs: Secondary | ICD-10-CM | POA: Insufficient documentation

## 2013-08-11 DIAGNOSIS — Z79899 Other long term (current) drug therapy: Secondary | ICD-10-CM | POA: Insufficient documentation

## 2013-08-11 DIAGNOSIS — E871 Hypo-osmolality and hyponatremia: Secondary | ICD-10-CM | POA: Diagnosis present

## 2013-08-11 DIAGNOSIS — N19 Unspecified kidney failure: Secondary | ICD-10-CM

## 2013-08-11 DIAGNOSIS — IMO0001 Reserved for inherently not codable concepts without codable children: Secondary | ICD-10-CM

## 2013-08-11 DIAGNOSIS — D649 Anemia, unspecified: Secondary | ICD-10-CM

## 2013-08-11 DIAGNOSIS — Z7982 Long term (current) use of aspirin: Secondary | ICD-10-CM | POA: Insufficient documentation

## 2013-08-11 DIAGNOSIS — F209 Schizophrenia, unspecified: Secondary | ICD-10-CM | POA: Insufficient documentation

## 2013-08-11 DIAGNOSIS — M978XXA Periprosthetic fracture around other internal prosthetic joint, initial encounter: Secondary | ICD-10-CM

## 2013-08-11 DIAGNOSIS — Y831 Surgical operation with implant of artificial internal device as the cause of abnormal reaction of the patient, or of later complication, without mention of misadventure at the time of the procedure: Secondary | ICD-10-CM

## 2013-08-11 DIAGNOSIS — F200 Paranoid schizophrenia: Secondary | ICD-10-CM | POA: Diagnosis present

## 2013-08-11 DIAGNOSIS — E119 Type 2 diabetes mellitus without complications: Secondary | ICD-10-CM | POA: Diagnosis present

## 2013-08-11 DIAGNOSIS — N189 Chronic kidney disease, unspecified: Principal | ICD-10-CM | POA: Insufficient documentation

## 2013-08-11 DIAGNOSIS — Z96649 Presence of unspecified artificial hip joint: Secondary | ICD-10-CM

## 2013-08-11 DIAGNOSIS — R4182 Altered mental status, unspecified: Secondary | ICD-10-CM | POA: Insufficient documentation

## 2013-08-11 DIAGNOSIS — I10 Essential (primary) hypertension: Secondary | ICD-10-CM | POA: Diagnosis present

## 2013-08-11 DIAGNOSIS — E1122 Type 2 diabetes mellitus with diabetic chronic kidney disease: Secondary | ICD-10-CM | POA: Diagnosis present

## 2013-08-11 LAB — COMPREHENSIVE METABOLIC PANEL
ALT: 8 U/L (ref 0–53)
ANION GAP: 15 (ref 5–15)
AST: 8 U/L (ref 0–37)
Albumin: 2.8 g/dL — ABNORMAL LOW (ref 3.5–5.2)
Alkaline Phosphatase: 87 U/L (ref 39–117)
BUN: 41 mg/dL — AB (ref 6–23)
CHLORIDE: 95 meq/L — AB (ref 96–112)
CO2: 24 mEq/L (ref 19–32)
CREATININE: 2.38 mg/dL — AB (ref 0.50–1.35)
Calcium: 8.7 mg/dL (ref 8.4–10.5)
GFR calc Af Amer: 32 mL/min — ABNORMAL LOW (ref >90)
GFR calc non Af Amer: 27 mL/min — ABNORMAL LOW (ref >90)
Glucose, Bld: 192 mg/dL — ABNORMAL HIGH (ref 70–99)
POTASSIUM: 4.5 meq/L (ref 3.7–5.3)
Sodium: 134 mEq/L — ABNORMAL LOW (ref 137–147)
TOTAL PROTEIN: 6.2 g/dL (ref 6.0–8.3)
Total Bilirubin: 0.3 mg/dL (ref 0.3–1.2)

## 2013-08-11 LAB — CBC WITH DIFFERENTIAL/PLATELET
Basophils Absolute: 0.1 10*3/uL (ref 0.0–0.1)
Basophils Relative: 1 % (ref 0–1)
EOS PCT: 3 % (ref 0–5)
Eosinophils Absolute: 0.3 10*3/uL (ref 0.0–0.7)
HEMATOCRIT: 33.1 % — AB (ref 39.0–52.0)
HEMOGLOBIN: 10.8 g/dL — AB (ref 13.0–17.0)
LYMPHS ABS: 2.2 10*3/uL (ref 0.7–4.0)
Lymphocytes Relative: 23 % (ref 12–46)
MCH: 28.4 pg (ref 26.0–34.0)
MCHC: 32.6 g/dL (ref 30.0–36.0)
MCV: 87.1 fL (ref 78.0–100.0)
MONO ABS: 1.1 10*3/uL — AB (ref 0.1–1.0)
Monocytes Relative: 11 % (ref 3–12)
NEUTROS ABS: 6.1 10*3/uL (ref 1.7–7.7)
Neutrophils Relative %: 62 % (ref 43–77)
Platelets: 406 10*3/uL — ABNORMAL HIGH (ref 150–400)
RBC: 3.8 MIL/uL — AB (ref 4.22–5.81)
RDW: 12.3 % (ref 11.5–15.5)
WBC: 9.7 10*3/uL (ref 4.0–10.5)

## 2013-08-11 LAB — URINALYSIS, ROUTINE W REFLEX MICROSCOPIC
Bilirubin Urine: NEGATIVE
Glucose, UA: 100 mg/dL — AB
HGB URINE DIPSTICK: NEGATIVE
Ketones, ur: NEGATIVE mg/dL
Leukocytes, UA: NEGATIVE
Nitrite: NEGATIVE
PH: 5.5 (ref 5.0–8.0)
Protein, ur: >300 mg/dL — AB
SPECIFIC GRAVITY, URINE: 1.022 (ref 1.005–1.030)
Urobilinogen, UA: 0.2 mg/dL (ref 0.0–1.0)

## 2013-08-11 LAB — GLUCOSE, CAPILLARY: Glucose-Capillary: 169 mg/dL — ABNORMAL HIGH (ref 70–99)

## 2013-08-11 LAB — I-STAT VENOUS BLOOD GAS, ED
Acid-Base Excess: 2 mmol/L (ref 0.0–2.0)
BICARBONATE: 27.9 meq/L — AB (ref 20.0–24.0)
O2 Saturation: 87 %
PCO2 VEN: 46.8 mmHg (ref 45.0–50.0)
TCO2: 29 mmol/L (ref 0–100)
pH, Ven: 7.383 — ABNORMAL HIGH (ref 7.250–7.300)
pO2, Ven: 54 mmHg — ABNORMAL HIGH (ref 30.0–45.0)

## 2013-08-11 LAB — URINE MICROSCOPIC-ADD ON

## 2013-08-11 LAB — RAPID URINE DRUG SCREEN, HOSP PERFORMED
Amphetamines: NOT DETECTED
BARBITURATES: NOT DETECTED
Benzodiazepines: NOT DETECTED
COCAINE: NOT DETECTED
OPIATES: POSITIVE — AB
TETRAHYDROCANNABINOL: NOT DETECTED

## 2013-08-11 LAB — MRSA PCR SCREENING: MRSA BY PCR: POSITIVE — AB

## 2013-08-11 LAB — CREATININE, URINE, RANDOM: Creatinine, Urine: 93.42 mg/dL

## 2013-08-11 LAB — SODIUM, URINE, RANDOM: Sodium, Ur: 33 mEq/L

## 2013-08-11 LAB — AMMONIA: Ammonia: 28 umol/L (ref 11–60)

## 2013-08-11 MED ORDER — TRIHEXYPHENIDYL HCL 5 MG PO TABS
5.0000 mg | ORAL_TABLET | Freq: Every day | ORAL | Status: DC
  Administered 2013-08-11 – 2013-08-16 (×6): 5 mg via ORAL
  Filled 2013-08-11 (×7): qty 1

## 2013-08-11 MED ORDER — LISINOPRIL 10 MG PO TABS
10.0000 mg | ORAL_TABLET | Freq: Every day | ORAL | Status: DC
  Administered 2013-08-13 – 2013-08-16 (×4): 10 mg via ORAL
  Filled 2013-08-11 (×6): qty 1

## 2013-08-11 MED ORDER — MORPHINE SULFATE ER 15 MG PO TBCR
15.0000 mg | EXTENDED_RELEASE_TABLET | Freq: Two times a day (BID) | ORAL | Status: DC
  Administered 2013-08-11 – 2013-08-16 (×10): 15 mg via ORAL
  Filled 2013-08-11 (×11): qty 1

## 2013-08-11 MED ORDER — TRIHEXYPHENIDYL HCL 5 MG PO TABS
2.5000 mg | ORAL_TABLET | Freq: Every day | ORAL | Status: DC
  Administered 2013-08-13 – 2013-08-16 (×4): 2.5 mg via ORAL
  Filled 2013-08-11 (×7): qty 1

## 2013-08-11 MED ORDER — INSULIN ASPART 100 UNIT/ML ~~LOC~~ SOLN
0.0000 [IU] | Freq: Three times a day (TID) | SUBCUTANEOUS | Status: DC
  Administered 2013-08-12: 3 [IU] via SUBCUTANEOUS
  Administered 2013-08-12 – 2013-08-16 (×2): 2 [IU] via SUBCUTANEOUS
  Administered 2013-08-16: 3 [IU] via SUBCUTANEOUS

## 2013-08-11 MED ORDER — HYDRALAZINE HCL 50 MG PO TABS
50.0000 mg | ORAL_TABLET | Freq: Three times a day (TID) | ORAL | Status: DC
  Administered 2013-08-11 – 2013-08-16 (×15): 50 mg via ORAL
  Filled 2013-08-11 (×19): qty 1

## 2013-08-11 MED ORDER — SODIUM CHLORIDE 0.9 % IV SOLN
INTRAVENOUS | Status: DC
  Administered 2013-08-11: 20:00:00 via INTRAVENOUS

## 2013-08-11 MED ORDER — ASPIRIN EC 81 MG PO TBEC
81.0000 mg | DELAYED_RELEASE_TABLET | Freq: Every day | ORAL | Status: DC
  Administered 2013-08-11 – 2013-08-16 (×5): 81 mg via ORAL
  Filled 2013-08-11 (×7): qty 1

## 2013-08-11 MED ORDER — HALOPERIDOL 5 MG PO TABS
15.0000 mg | ORAL_TABLET | Freq: Two times a day (BID) | ORAL | Status: DC
  Administered 2013-08-11 – 2013-08-16 (×11): 15 mg via ORAL
  Filled 2013-08-11 (×14): qty 3

## 2013-08-11 MED ORDER — VITAMIN B-12 1000 MCG PO TABS
1000.0000 ug | ORAL_TABLET | Freq: Every day | ORAL | Status: DC
  Administered 2013-08-11 – 2013-08-16 (×5): 1000 ug via ORAL
  Filled 2013-08-11 (×7): qty 1

## 2013-08-11 MED ORDER — TAMSULOSIN HCL 0.4 MG PO CAPS
0.4000 mg | ORAL_CAPSULE | Freq: Every day | ORAL | Status: DC
  Administered 2013-08-12 – 2013-08-16 (×5): 0.4 mg via ORAL
  Filled 2013-08-11 (×6): qty 1

## 2013-08-11 MED ORDER — ONDANSETRON HCL 4 MG PO TABS: 4.0000 mg | ORAL_TABLET | Freq: Four times a day (QID) | ORAL | Status: DC | PRN

## 2013-08-11 MED ORDER — AMLODIPINE BESYLATE 10 MG PO TABS
10.0000 mg | ORAL_TABLET | Freq: Every day | ORAL | Status: DC
  Administered 2013-08-12 – 2013-08-16 (×5): 10 mg via ORAL
  Filled 2013-08-11 (×6): qty 1

## 2013-08-11 MED ORDER — LORAZEPAM 2 MG/ML IJ SOLN
2.0000 mg | Freq: Once | INTRAMUSCULAR | Status: AC
  Administered 2013-08-11: 2 mg via INTRAMUSCULAR
  Filled 2013-08-11: qty 1

## 2013-08-11 MED ORDER — ACETAMINOPHEN 325 MG PO TABS
650.0000 mg | ORAL_TABLET | Freq: Four times a day (QID) | ORAL | Status: DC | PRN
  Filled 2013-08-11: qty 2

## 2013-08-11 MED ORDER — HEPARIN SODIUM (PORCINE) 5000 UNIT/ML IJ SOLN
5000.0000 [IU] | Freq: Three times a day (TID) | INTRAMUSCULAR | Status: DC
  Administered 2013-08-12 – 2013-08-17 (×8): 5000 [IU] via SUBCUTANEOUS
  Filled 2013-08-11 (×18): qty 1

## 2013-08-11 MED ORDER — TRIHEXYPHENIDYL HCL 5 MG PO TABS: 2.5000 mg | ORAL_TABLET | ORAL | Status: DC

## 2013-08-11 MED ORDER — PANTOPRAZOLE SODIUM 40 MG PO TBEC
40.0000 mg | DELAYED_RELEASE_TABLET | Freq: Every day | ORAL | Status: DC
  Administered 2013-08-13 – 2013-08-16 (×4): 40 mg via ORAL
  Filled 2013-08-11 (×5): qty 1

## 2013-08-11 MED ORDER — HALOPERIDOL LACTATE 5 MG/ML IJ SOLN
1.0000 mg | Freq: Once | INTRAMUSCULAR | Status: AC
  Administered 2013-08-11: 1 mg via INTRAMUSCULAR
  Filled 2013-08-11: qty 1

## 2013-08-11 MED ORDER — POLYETHYLENE GLYCOL 3350 17 G PO PACK
17.0000 g | PACK | Freq: Every day | ORAL | Status: DC | PRN
  Administered 2013-08-12 – 2013-08-15 (×2): 17 g via ORAL
  Filled 2013-08-11 (×2): qty 1

## 2013-08-11 MED ORDER — ATENOLOL 100 MG PO TABS
100.0000 mg | ORAL_TABLET | Freq: Every day | ORAL | Status: DC
  Administered 2013-08-13 – 2013-08-16 (×4): 100 mg via ORAL
  Filled 2013-08-11 (×6): qty 1

## 2013-08-11 MED ORDER — HYDROCODONE-ACETAMINOPHEN 5-325 MG PO TABS
1.0000 | ORAL_TABLET | Freq: Three times a day (TID) | ORAL | Status: DC | PRN
  Administered 2013-08-14 – 2013-08-16 (×3): 1 via ORAL
  Filled 2013-08-11 (×4): qty 1

## 2013-08-11 MED ORDER — CLONAZEPAM 0.5 MG PO TABS
0.2500 mg | ORAL_TABLET | Freq: Two times a day (BID) | ORAL | Status: DC
  Administered 2013-08-11 – 2013-08-16 (×10): 0.25 mg via ORAL
  Filled 2013-08-11 (×12): qty 1

## 2013-08-11 MED ORDER — OXCARBAZEPINE 300 MG PO TABS
450.0000 mg | ORAL_TABLET | Freq: Two times a day (BID) | ORAL | Status: DC
  Administered 2013-08-11 – 2013-08-16 (×10): 450 mg via ORAL
  Filled 2013-08-11 (×13): qty 1

## 2013-08-11 MED ORDER — SPIRONOLACTONE 12.5 MG HALF TABLET
12.5000 mg | ORAL_TABLET | Freq: Every day | ORAL | Status: DC
  Filled 2013-08-11: qty 1

## 2013-08-11 NOTE — ED Notes (Addendum)
Pt [facility]. Recently had L hip replacement surgery. Sent to ED for possible renal failure. Pt is alert upon arrival. History of schizophrenia.

## 2013-08-11 NOTE — ED Notes (Signed)
Pt refusing to have IV placed.

## 2013-08-11 NOTE — ED Notes (Signed)
Dr. Shela CommonsJ updated about pt agitation. Pt stating he wants to leave and go back to the place he came. Refusing to let staff help him become more comfortable. Attempted to put him in chair but would not comply.

## 2013-08-11 NOTE — ED Notes (Signed)
IV team called [facility] they will attempt to start IV

## 2013-08-11 NOTE — H&P (Signed)
Date: 08/11/2013               Patient Name:  David Lang MRN: [id number]  DOB: 1951-01-13 Age / Sex: 63 y.o., male   PCP: Provider Default, MD         Medical Service: Internal Medicine Teaching Service         Attending Physician: Dr. Levert Feinstein, MD    First Contact: Dr. Danella Penton Pager: [phone]  Second Contact: Dr. Virgina Organ Pager: [phone]       After Hours (After 5p/  First Contact Pager: [phone]  weekends / holidays): Second Contact Pager: [phone]   Chief Complaint: swollen left leg  History of Present Illness: Pt is a 63 y/o M w/ PMHx of HTN, DM, and paranoid schizophrenia who presents from his assisted living facility at [facility]  w/ a swollen left leg. He had left hip arthroplasty on 03/2012 and was recently admitted on 7/11 with a nondisplaced periprosthetic proximal femoral fracture s/p fall. It was recommend that patient have non-weight bearing activities x 6 weeks and then reassess need for revision surgery. Pt has an appointment with Dr. [name] on 8/5 for this. Pt does not complain of any pain but per caretaker his left leg has been more swollen than the left.    Meds: No current facility-administered medications for this encounter.   Current Outpatient Prescriptions  Medication Sig Dispense Refill  . acetaminophen (TYLENOL) 325 MG tablet Take 650 mg by mouth every 6 (six) hours as needed for mild pain, fever or headache.      Marland Kitchen amLODipine (NORVASC) 10 MG tablet Take 1 tablet (10 mg total) by mouth daily.      Marland Kitchen aspirin EC 81 MG tablet Take 81 mg by mouth daily.      Marland Kitchen atenolol (TENORMIN) 100 MG tablet Take 1 tablet (100 mg total) by mouth daily.      . clonazePAM (KLONOPIN) 0.5 MG tablet Take 0.5 tablets (0.25 mg total) by mouth 2 (two) times daily.  30 tablet  0  . cloNIDine (CATAPRES - DOSED IN MG/24 HR) 0.3 mg/24hr Place 1 patch (0.3 mg total) onto the skin once a week.  4 patch    . haloperidol (HALDOL) 10 MG tablet Take 15 mg by mouth 2 (two) times  daily.      . haloperidol (HALDOL) 2 MG tablet Take 2 mg by mouth daily as needed (for increased psychosis/hallucinations with agitation or aggression.).      Marland Kitchen hydrALAZINE (APRESOLINE) 50 MG tablet Take 50 mg by mouth 3 (three) times daily.      Marland Kitchen HYDROcodone-acetaminophen (NORCO/VICODIN) 5-325 MG per tablet Take 1 tablet by mouth every 8 (eight) hours as needed (for breakthrough pain).  63 tablet  0  . lisinopril (PRINIVIL,ZESTRIL) 10 MG tablet Take 10 mg by mouth daily.      Marland Kitchen loperamide (IMODIUM) 2 MG capsule Take 2 mg by mouth 3 (three) times daily as needed for diarrhea or loose stools.      Marland Kitchen morphine (MS CONTIN) 15 MG 12 hr tablet Take 1 tablet (15 mg total) by mouth every 12 (twelve) hours.  42 tablet  0  . ondansetron (ZOFRAN) 4 MG tablet Take 1 tablet (4 mg total) by mouth every 6 (six) hours as needed for nausea.  20 tablet    . OXcarbazepine (TRILEPTAL) 150 MG tablet Take 3 tablets (450 mg total) by mouth 2 (two) times daily.      . OYSTER SHELL CALCIUM  PO Take 1 tablet by mouth daily.      . pantoprazole (PROTONIX) 40 MG tablet Take 1 tablet (40 mg total) by mouth daily.      . polyethylene glycol (MIRALAX / GLYCOLAX) packet Take 17 g by mouth daily as needed for mild constipation.      Marland Kitchen spironolactone (ALDACTONE) 25 MG tablet Take 12.5 mg by mouth daily.      . tamsulosin (FLOMAX) 0.4 MG CAPS capsule Take 0.4 mg by mouth.      . trihexyphenidyl (ARTANE) 5 MG tablet Take 2.5-5 mg by mouth See admin instructions. Take 2.5mg  by mouth in the morning and take 5mg  by mouth at bedtime.      . vitamin B-12 1000 MCG tablet Take 1 tablet (1,000 mcg total) by mouth daily.        Allergies: Allergies as of 08/11/2013  . (No Known Allergies)   Past Medical History  Diagnosis Date  . Hypertension   . Diabetes mellitus without complication   . Mental disorder   . Schizophrenia    Past Surgical History  Procedure Laterality Date  . Hip arthroplasty Left 03/31/2012    Procedure:  ARTHROPLASTY BIPOLAR HIP;  Surgeon: Mable Paris, MD;  Location: [facility];  Service: Orthopedics;  Laterality: Left;   History reviewed. No pertinent family history. History   Social History  . Marital Status: Single    Spouse Name: N/A    Number of Children: N/A  . Years of Education: N/A   Occupational History  . Not on file.   Social History Main Topics  . Smoking status: Never Smoker   . Smokeless tobacco: Never Used  . Alcohol Use: No     Comment: not answering  . Drug Use: No     Comment: not answering questions  . Sexual Activity: No     Comment: not cooperative with questions   Other Topics Concern  . Not on file   Social History Narrative  . No narrative on file    Review of Systems: Difficult to obtain due to patient's mental hx of paranoid schizophrenia. Denies any pain.   Physical Exam: Blood pressure 142/68, pulse 79, temperature 97.6 F (36.4 C), temperature source Oral, resp. rate 16, SpO2 98.00%. General appearance: alert, no distress and uncooperative Heart: regular rate and rhythm, S1, S2 normal, no murmur, click, rub or gallop Abdomen: soft, non-tender; bowel sounds normal; no masses,  no organomegaly Extremities: lt leg larger than rt, no pitting edema, non tender to palpation, lt leg externally rotated and shorter than right Neurologic: Grossly normal, alert and oriented to person, place, and time.   Lab results: Basic Metabolic Panel:  Recent Labs  16/10/96 1218  NA 134*  K 4.5  CL 95*  CO2 24  GLUCOSE 192*  BUN 41*  CREATININE 2.38*  CALCIUM 8.7   Liver Function Tests:  Recent Labs  08/11/13 1218  AST 8  ALT 8  ALKPHOS 87  BILITOT 0.3  PROT 6.2  ALBUMIN 2.8*    Recent Labs  08/11/13 1218  AMMONIA 28   CBC:  Recent Labs  08/11/13 1218  WBC 9.7  NEUTROABS 6.1  HGB 10.8*  HCT 33.1*  MCV 87.1  PLT 406*   Urinalysis:  Recent Labs  08/11/13 1150  COLORURINE YELLOW  LABSPEC 1.022  PHURINE 5.5    GLUCOSEU 100*  HGBUR NEGATIVE  BILIRUBINUR NEGATIVE  KETONESUR NEGATIVE  PROTEINUR >300*  UROBILINOGEN 0.2  NITRITE NEGATIVE  LEUKOCYTESUR NEGATIVE  Imaging results:  Ct Head Wo Contrast  08/11/2013   EXAM: CT HEAD WITHOUT CONTRAST  TECHNIQUE: Contiguous axial images were obtained from the base of the skull through the vertex without intravenous contrast.  COMPARISON:  Noncontrast CT scan of the brain of March 30, 2012  FINDINGS: There is mild diffuse cerebral and cerebellar atrophy. Since the previous study there has developed an approximately 7 mm diameter hypodensity in the anterior limb of the right internal capsule consistent with a lacunar infarction. There is decreased density in the deep white matter of both cerebral hemispheres consistent with chronic small vessel ischemia. There is no intracranial hemorrhage. The cerebellum and brainstem are unremarkable.  There is mucoperiosteal thickening within the maxillary sinuses. There is no acute skull fracture.  IMPRESSION: 1. There is no acute ischemic or hemorrhagic event. There is no intracranial mass effect or hydrocephalus. 2. There is a subcentimeter lacunar infarction in the anterior limb of the right internal capsule new since March of 2014. There are stable changes of chronic small vessel ischemia.   Electronically Signed   By: David  [place]azil[place]   On: 08/11/2013 13:29    Assessment & Plan by Problem: Active Problems:   Acute renal failure superimposed on stage 3 chronic kidney disease  Swollen left leg: DDx include DVT due to recent fall on left hip on 7/11.  - ordered venous doppler on left leg  - well's score of 3 ( symptoms of DVT, 3 pts) thus a moderate probability of DVT  Left nondisplaced periprosthetic proximal femoral fracture- Pt to continue non weight bearing activity for 5 more weeks and then reassess whether a revision sx is required.  -hydrocodone/acetaminophen 5-325 mg PRN and MS Contin 15 mg every 12 hours for pain.    Hyponatremia- chronic likely 2/2 to medications.  - will repeat BMET tomorrow morning  AKI:  Cr 1.39 04/15/2012, on admission Cr 2.38 which is improved from Cr on last admission. Will need IVFs once pt allows team to place an IV line.    Paranoid schizophrenia: Chronic and stable on klonopin 0.25mg  BID, haldol 15 mg BID, tripletal 450mg  BID, trihexyphenidyl 2.5 mg qAM and 5mg  QHS.   Hypertension: Continue  home meds amlodipine 10 mg daily, atenolol 100 mg daily, hydralazine 50 TID, clonidine patch  - Holding lisinopril 10mg  daily due to elevated Cr   Diabetes mellitus type 2:  -SSI  - ordered HbA1c  Chronic anemia: baseline Hgb 10, currently 10.8. Will continue to monitor.   DVT prophylaxis - heparin SQ TID   FEN  -will need IVF, currently pending IV line placement - Diabetic diet, cont home protonix 40mg  daily  - Vitamin B12 and calcium held   Dispo: Disposition is deferred at this time, awaiting improvement of current medical problems.   The patient does have a current PCP (Provider Default, MD) and [facility] after [facility].  The patient does not have transportation limitations that hinder transportation to clinic appointments.  Signed: Gara Kroneriana Edric Fetterman, MD 08/11/2013, 6:36 PM

## 2013-08-11 NOTE — ED Notes (Signed)
Pt trying to get out of bed. Pt is agitated. Pt given urinal.

## 2013-08-11 NOTE — ED Notes (Signed)
Pt caregiver at bedside. sts that she thinks pt has been constipated lately.

## 2013-08-11 NOTE — Progress Notes (Signed)
Patient trying to get out of bed,states "I need to get my vehicle". Patient uncooperative and persistent in trying to get out of bed.MD on call notified,also made aware of patient's refusal to take of meds and lab draw.Order received.Will continue to monitor. Kiosha Buchan, Drinda Buttsharito Joselita, RCharity fundraiser

## 2013-08-11 NOTE — ED Notes (Signed)
IV attempted x's 2 without success.  IV team paged.

## 2013-08-11 NOTE — Progress Notes (Signed)
Writer informed Dr. Shela CommonsJ that the patient did not receive a Tele Assessment because the patient refusing to speak or look at the monitor.

## 2013-08-11 NOTE — ED Provider Notes (Addendum)
CSN: [id number]     Arrival date & time 08/11/13  1023 History   First MD Initiated Contact with Patient 08/11/13 1128     Chief Complaint  Patient presents with  . Abnormal Lab     (Consider location/radiation/quality/duration/timing/severity/associated sxs/prior Treatment) HPI Level V caveat altered mental status history is obtained from records counting patient and from Dr. Amalia Hailey by telephone at [phone] patient sent here for evaluation of acute on chronic renal failure. Patient had serum creatinine of 1.68 on 07/21/2013.Dr./ Amalia Hailey evaluated him 2 days ago and found to be alert pocket and appropriate. Past Medical History  Diagnosis Date  . Hypertension   . Diabetes mellitus without complication   . Mental disorder   . Schizophrenia    Past Surgical History  Procedure Laterality Date  . Hip arthroplasty Left 03/31/2012    Procedure: ARTHROPLASTY BIPOLAR HIP;  Surgeon: Mable Paris, MD;  Location: [facility];  Service: Orthopedics;  Laterality: Left;   History reviewed. No pertinent family history. History  Substance Use Topics  . Smoking status: Never Smoker   . Smokeless tobacco: Never Used  . Alcohol Use: No     Comment: not answering    Review of Systems  Unable to perform ROS: Mental status change      Allergies  Review of patient's allergies indicates no known allergies.  Home Medications   Prior to Admission medications   Medication Sig Start Date End Date Taking? Authorizing Provider  acetaminophen (TYLENOL) 325 MG tablet Take 650 mg by mouth every 6 (six) hours as needed for mild pain, fever or headache.   Yes Historical Provider, MD  amLODipine (NORVASC) 10 MG tablet Take 1 tablet (10 mg total) by mouth daily. 04/18/12  Yes Kela Millin, MD  aspirin EC 81 MG tablet Take 81 mg by mouth daily.   Yes Historical Provider, MD  atenolol (TENORMIN) 100 MG tablet Take 1 tablet (100 mg total) by mouth daily. 04/18/12  Yes Adeline Joselyn Glassman, MD   clonazePAM (KLONOPIN) 0.5 MG tablet Take 0.5 tablets (0.25 mg total) by mouth 2 (two) times daily. 04/18/12  Yes Adeline Joselyn Glassman, MD  cloNIDine (CATAPRES - DOSED IN MG/24 HR) 0.3 mg/24hr Place 1 patch (0.3 mg total) onto the skin once a week. 04/18/12  Yes Adeline Joselyn Glassman, MD  haloperidol (HALDOL) 10 MG tablet Take 15 mg by mouth 2 (two) times daily.   Yes Historical Provider, MD  haloperidol (HALDOL) 2 MG tablet Take 2 mg by mouth daily as needed (for increased psychosis/hallucinations with agitation or aggression.).   Yes Historical Provider, MD  hydrALAZINE (APRESOLINE) 50 MG tablet Take 50 mg by mouth 3 (three) times daily.   Yes Historical Provider, MD  HYDROcodone-acetaminophen (NORCO/VICODIN) 5-325 MG per tablet Take 1 tablet by mouth every 8 (eight) hours as needed (for breakthrough pain). 08/06/13  Yes Dionne Ano, MD  lisinopril (PRINIVIL,ZESTRIL) 10 MG tablet Take 10 mg by mouth daily.   Yes Historical Provider, MD  loperamide (IMODIUM) 2 MG capsule Take 2 mg by mouth 3 (three) times daily as needed for diarrhea or loose stools.   Yes Historical Provider, MD  morphine (MS CONTIN) 15 MG 12 hr tablet Take 1 tablet (15 mg total) by mouth every 12 (twelve) hours. 08/06/13  Yes Dionne Ano, MD  ondansetron (ZOFRAN) 4 MG tablet Take 1 tablet (4 mg total) by mouth every 6 (six) hours as needed for nausea. 04/18/12  Yes Kela Millin, MD  OXcarbazepine (  TRILEPTAL) 150 MG tablet Take 3 tablets (450 mg total) by mouth 2 (two) times daily. 04/18/12  Yes Adeline Joselyn Glassman, MD  OYSTER SHELL CALCIUM PO Take 1 tablet by mouth daily.   Yes Historical Provider, MD  pantoprazole (PROTONIX) 40 MG tablet Take 1 tablet (40 mg total) by mouth daily. 04/18/12  Yes Adeline Joselyn Glassman, MD  polyethylene glycol (MIRALAX / GLYCOLAX) packet Take 17 g by mouth daily as needed for mild constipation. 04/18/12  Yes Adeline Joselyn Glassman, MD  spironolactone (ALDACTONE) 25 MG tablet Take 12.5 mg by mouth daily.   Yes Historical  Provider, MD  tamsulosin (FLOMAX) 0.4 MG CAPS capsule Take 0.4 mg by mouth.   Yes Historical Provider, MD  trihexyphenidyl (ARTANE) 5 MG tablet Take 2.5-5 mg by mouth See admin instructions. Take 2.5mg  by mouth in the morning and take 5mg  by mouth at bedtime.   Yes Historical Provider, MD  vitamin B-12 1000 MCG tablet Take 1 tablet (1,000 mcg total) by mouth daily. 04/18/12  Yes Adeline C Viyuoh, MD   BP 123/52  Temp(Src) 97.6 F (36.4 C) (Oral)  Resp 10  SpO2 94% Physical Exam  Nursing note and vitals reviewed. Constitutional:  Sleepy arousable loud verbal stimulus  HENT:  Head: Normocephalic and atraumatic.  Eyes: Conjunctivae are normal. Pupils are equal, round, and reactive to light.  Neck: Neck supple. No tracheal deviation present. No thyromegaly present.  Cardiovascular: Normal rate and regular rhythm.   No murmur heard. Pulmonary/Chest: Effort normal and breath sounds normal.  Abdominal: Soft. Bowel sounds are normal. He exhibits no distension. There is no tenderness.  Musculoskeletal: Normal range of motion. He exhibits no edema and no tenderness.  Neurological: He is alert. No cranial nerve deficit. Coordination normal.  Follow simple commands moves all extremities  Skin: Skin is warm and dry. No rash noted.  Psychiatric: He has a normal mood and affect.    ED Course  Procedures (including critical care time) Labs Review Labs Reviewed  COMPREHENSIVE METABOLIC PANEL  CBC WITH DIFFERENTIAL  AMMONIA  URINALYSIS, ROUTINE W REFLEX MICROSCOPIC  BLOOD GAS, VENOUS    Imaging Review No results found.   EKG Interpretation None     Results for orders placed during the hospital encounter of 08/11/13  COMPREHENSIVE METABOLIC PANEL      Result Value Ref Range   Sodium 134 (*) 137 - 147 mEq/L   Potassium 4.5  3.7 - 5.3 mEq/L   Chloride 95 (*) 96 - 112 mEq/L   CO2 24  19 - 32 mEq/L   Glucose, Bld 192 (*) 70 - 99 mg/dL   BUN 41 (*) 6 - 23 mg/dL   Creatinine, Ser 4.09  (*) 0.50 - 1.35 mg/dL   Calcium 8.7  8.4 - 81.1 mg/dL   Total Protein 6.2  6.0 - 8.3 g/dL   Albumin 2.8 (*) 3.5 - 5.2 g/dL   AST 8  0 - 37 U/L   ALT 8  0 - 53 U/L   Alkaline Phosphatase 87  39 - 117 U/L   Total Bilirubin 0.3  0.3 - 1.2 mg/dL   GFR calc non Af Amer 27 (*) >90 mL/min   GFR calc Af Amer 32 (*) >90 mL/min   Anion gap 15  5 - 15  CBC WITH DIFFERENTIAL      Result Value Ref Range   WBC 9.7  4.0 - 10.5 K/uL   RBC 3.80 (*) 4.22 - 5.81 MIL/uL   Hemoglobin 10.8 (*)  13.0 - 17.0 g/dL   HCT 16.1 (*) 09.6 - 04.5 %   MCV 87.1  78.0 - 100.0 fL   MCH 28.4  26.0 - 34.0 pg   MCHC 32.6  30.0 - 36.0 g/dL   RDW 40.9  81.1 - 91.4 %   Platelets 406 (*) 150 - 400 K/uL   Neutrophils Relative % 62  43 - 77 %   Neutro Abs 6.1  1.7 - 7.7 K/uL   Lymphocytes Relative 23  12 - 46 %   Lymphs Abs 2.2  0.7 - 4.0 K/uL   Monocytes Relative 11  3 - 12 %   Monocytes Absolute 1.1 (*) 0.1 - 1.0 K/uL   Eosinophils Relative 3  0 - 5 %   Eosinophils Absolute 0.3  0.0 - 0.7 K/uL   Basophils Relative 1  0 - 1 %   Basophils Absolute 0.1  0.0 - 0.1 K/uL  AMMONIA      Result Value Ref Range   Ammonia 28  11 - 60 umol/L  URINALYSIS, ROUTINE W REFLEX MICROSCOPIC      Result Value Ref Range   Color, Urine YELLOW  YELLOW   APPearance CLEAR  CLEAR   Specific Gravity, Urine 1.022  1.005 - 1.030   pH 5.5  5.0 - 8.0   Glucose, UA 100 (*) NEGATIVE mg/dL   Hgb urine dipstick NEGATIVE  NEGATIVE   Bilirubin Urine NEGATIVE  NEGATIVE   Ketones, ur NEGATIVE  NEGATIVE mg/dL   Protein, ur >782 (*) NEGATIVE mg/dL   Urobilinogen, UA 0.2  0.0 - 1.0 mg/dL   Nitrite NEGATIVE  NEGATIVE   Leukocytes, UA NEGATIVE  NEGATIVE  URINE MICROSCOPIC-ADD ON      Result Value Ref Range   Casts HYALINE CASTS (*) NEGATIVE  I-STAT VENOUS BLOOD GAS, ED      Result Value Ref Range   pH, Ven 7.383 (*) 7.250 - 7.300   pCO2, Ven 46.8  45.0 - 50.0 mmHg   pO2, Ven 54.0 (*) 30.0 - 45.0 mmHg   Bicarbonate 27.9 (*) 20.0 - 24.0 mEq/L    TCO2 29  0 - 100 mmol/L   O2 Saturation 87.0     Acid-Base Excess 2.0  0.0 - 2.0 mmol/L   Sample type VENOUS     Dg Hip Complete Left  08/05/2013   CLINICAL DATA:  Fall.  EXAM: LEFT HIP - COMPLETE 2+ VIEW  COMPARISON:  None.  FINDINGS: No acute bony or joint abnormality. Diffuse osteopenia. Left hip replacement. Left hip prosthesis in good anatomic position.  IMPRESSION: 1. No acute abnormality. 2. Left hip replacement with good anatomic position of prosthesis.   Electronically Signed   By: Maisie Fus  Register   On: 08/05/2013 17:38   Ct Head Wo Contrast  08/11/2013   EXAM: CT HEAD WITHOUT CONTRAST  TECHNIQUE: Contiguous axial images were obtained from the base of the skull through the vertex without intravenous contrast.  COMPARISON:  Noncontrast CT scan of the brain of March 30, 2012  FINDINGS: There is mild diffuse cerebral and cerebellar atrophy. Since the previous study there has developed an approximately 7 mm diameter hypodensity in the anterior limb of the right internal capsule consistent with a lacunar infarction. There is decreased density in the deep white matter of both cerebral hemispheres consistent with chronic small vessel ischemia. There is no intracranial hemorrhage. The cerebellum and brainstem are unremarkable.  There is mucoperiosteal thickening within the maxillary sinuses. There is no acute skull fracture.  IMPRESSION: 1. There is no acute ischemic or hemorrhagic event. There is no intracranial mass effect or hydrocephalus. 2. There is a subcentimeter lacunar infarction in the anterior limb of the right internal capsule new since March of 2014. There are stable changes of chronic small vessel ischemia.   Electronically Signed   By: David  [place]   On: 08/11/2013 13:29   Ct Pelvis Wo Contrast  08/05/2013   CLINICAL DATA:  Status post fall.  Left hip pain.  EXAM: CT PELVIS WITHOUT CONTRAST  TECHNIQUE: Multidetector CT imaging of the pelvis was performed following the standard protocol  without intravenous contrast.  COMPARISON:  Plain films earlier this same date.  FINDINGS: Bipolar left hip hemiarthroplasty is in place. The device is located. The patient has a nondisplaced fracture along the anterior cortex of the femur. No other fracture is identified. Musculature about the pelvis is unremarkable. Imaged intrapelvic contents demonstrate no focal abnormality.  IMPRESSION: Nondisplaced periprosthetic proximal femoral fracture. No other acute abnormality.   Electronically Signed   By: Drusilla Kannerhomas  Dalessio M.D.   On: 08/05/2013 18:53   3 patient was alert agitated refusing all care patient states "I will not allow you to start an IV until I can talk to the police". I consult with TTS stated that patient's guardian needed to make decision as to whether we could care for him. I spoke with his guardian his sister Dr.Nawja Shammas at 4 PM who consented to allow his to provide all care for this patient. Ativan 2 mg IM ordered for agitation nor that patient could cooperate with IV insertion. MDM  [facility] who will arrange for inpatient stay Final diagnoses:  None   Dx #1 renal failure #2 hyperglycemia     Doug SouSam Herve Haug, MD 08/11/13 1431  Doug SouSam Alveena Taira, MD 08/11/13 [phone]

## 2013-08-12 ENCOUNTER — Observation Stay (HOSPITAL_COMMUNITY): Payer: BC Managed Care – PPO

## 2013-08-12 DIAGNOSIS — I129 Hypertensive chronic kidney disease with stage 1 through stage 4 chronic kidney disease, or unspecified chronic kidney disease: Secondary | ICD-10-CM | POA: Diagnosis not present

## 2013-08-12 DIAGNOSIS — N179 Acute kidney failure, unspecified: Secondary | ICD-10-CM | POA: Diagnosis not present

## 2013-08-12 DIAGNOSIS — I6350 Cerebral infarction due to unspecified occlusion or stenosis of unspecified cerebral artery: Secondary | ICD-10-CM | POA: Diagnosis not present

## 2013-08-12 DIAGNOSIS — F200 Paranoid schizophrenia: Secondary | ICD-10-CM | POA: Diagnosis not present

## 2013-08-12 LAB — BASIC METABOLIC PANEL
ANION GAP: 15 (ref 5–15)
BUN: 34 mg/dL — ABNORMAL HIGH (ref 6–23)
CALCIUM: 9 mg/dL (ref 8.4–10.5)
CO2: 24 meq/L (ref 19–32)
Chloride: 97 mEq/L (ref 96–112)
Creatinine, Ser: 1.98 mg/dL — ABNORMAL HIGH (ref 0.50–1.35)
GFR calc non Af Amer: 34 mL/min — ABNORMAL LOW (ref >90)
GFR, EST AFRICAN AMERICAN: 40 mL/min — AB (ref >90)
Glucose, Bld: 126 mg/dL — ABNORMAL HIGH (ref 70–99)
Potassium: 4.2 mEq/L (ref 3.7–5.3)
SODIUM: 136 meq/L — AB (ref 137–147)

## 2013-08-12 LAB — GLUCOSE, CAPILLARY
GLUCOSE-CAPILLARY: 166 mg/dL — AB (ref 70–99)
Glucose-Capillary: 130 mg/dL — ABNORMAL HIGH (ref 70–99)
Glucose-Capillary: 208 mg/dL — ABNORMAL HIGH (ref 70–99)
Glucose-Capillary: 140 mg/dL — ABNORMAL HIGH (ref 70–99)

## 2013-08-12 LAB — HIV ANTIBODY (ROUTINE TESTING W REFLEX): HIV 1&2 Ab, 4th Generation: NONREACTIVE

## 2013-08-12 MED ORDER — CLONAZEPAM 0.5 MG PO TABS
0.5000 mg | ORAL_TABLET | Freq: Once | ORAL | Status: AC
  Administered 2013-08-12: 0.5 mg via ORAL
  Filled 2013-08-12: qty 1

## 2013-08-12 MED ORDER — DIPHENHYDRAMINE HCL 25 MG PO CAPS
25.0000 mg | ORAL_CAPSULE | Freq: Once | ORAL | Status: AC
  Administered 2013-08-12: 25 mg via ORAL
  Filled 2013-08-12: qty 1

## 2013-08-12 MED ORDER — CLONAZEPAM 1 MG PO TABS: 1.0000 mg | ORAL_TABLET | Freq: Once | ORAL | Status: DC

## 2013-08-12 MED ORDER — HALOPERIDOL 5 MG PO TABS
5.0000 mg | ORAL_TABLET | Freq: Once | ORAL | Status: AC
  Administered 2013-08-13: 5 mg via ORAL
  Filled 2013-08-12: qty 1

## 2013-08-12 MED ORDER — DIPHENHYDRAMINE HCL 50 MG/ML IJ SOLN: 25.0000 mg | Freq: Once | INTRAMUSCULAR | Status: DC

## 2013-08-12 MED ORDER — MUPIROCIN 2 % EX OINT
1.0000 "application " | TOPICAL_OINTMENT | Freq: Two times a day (BID) | CUTANEOUS | Status: AC
  Administered 2013-08-12 – 2013-08-15 (×3): 1 via NASAL
  Filled 2013-08-12: qty 22

## 2013-08-12 MED ORDER — BISACODYL 10 MG RE SUPP
10.0000 mg | Freq: Once | RECTAL | Status: DC
  Filled 2013-08-12: qty 1

## 2013-08-12 MED ORDER — CHLORHEXIDINE GLUCONATE CLOTH 2 % EX PADS
6.0000 | MEDICATED_PAD | Freq: Every day | CUTANEOUS | Status: AC
  Administered 2013-08-14 – 2013-08-16 (×2): 6 via TOPICAL

## 2013-08-12 NOTE — Progress Notes (Signed)
08/12/13 22:55 Patient attempting several times  to get out of bed unassisted and would not have staff assist him,very unsteady on feet.MD on call notified.Order received.Will continue to monitor. Thomson Herbers, Drinda Buttsharito Joselita, RCharity fundraiser

## 2013-08-12 NOTE — Progress Notes (Addendum)
Subjective: [name] was seen and examined at bedside this morning. He was initially talking loudly and wanting to get out of bed. He had also urinated on the floor this morning. He claims not wanting to be in the hospital and reports leg pain with movement but wishes to sit in the chair.   Objective: Vital signs in last 24 hours: Filed Vitals:   08/11/13 1806 08/11/13 2014 08/11/13 2146 08/12/13 0727  BP: 142/68 195/93  179/88  Pulse: 79 83 87 84  Temp:  97.6 F (36.4 C) 98.1 F (36.7 C) 98.3 F (36.8 C)  TempSrc:  Oral Oral Oral  Resp: 16 16 18 18   Height:  6' (1.829 m)    Weight:   203 lb 0.7 oz (92.1 kg)   SpO2: 98% 99% 100% 98%   Weight change:   Intake/Output Summary (Last 24 hours) at 08/12/13 1121 Last data filed at 08/12/13 0657  Gross per 24 hour  Intake 991.67 ml  Output   1900 ml  Net -908.33 ml   Vitals reviewed. General: resting in bed, talking loudly, wanting to get out of bed HEENT: EOMI, wears glasses Cardiac: did not let me auscultate Pulm: did not let me examined Abd: unable to examin Ext: extremities soft, non-tender to palpation, mild edema around ankles, no erythema, appear symmetrical. Supposed to be NWB, able to get out of bed with assistance and move to chair where he was comfortable Neuro/Psych: alert and oriented X3, paranoid schizophrenic, refuses lab draws and IV, cooperative when directed Lab Results: Basic Metabolic Panel:  Recent Labs Lab 08/11/13 1218 08/12/13 0456  NA 134* 136*  K 4.5 4.2  CL 95* 97  CO2 24 24  GLUCOSE 192* 126*  BUN 41* 34*  CREATININE 2.38* 1.98*  CALCIUM 8.7 9.0   Liver Function Tests:  Recent Labs Lab 08/11/13 1218  AST 8  ALT 8  ALKPHOS 87  BILITOT 0.3  PROT 6.2  ALBUMIN 2.8*    Recent Labs Lab 08/11/13 1218  AMMONIA 28   CBC:  Recent Labs Lab 08/05/13 1629 08/11/13 1218  WBC 11.6* 9.7  NEUTROABS 8.5* 6.1  HGB 12.7* 10.8*  HCT 37.2* 33.1*  MCV 85.3 87.1  PLT 445* 406*    CBG:  Recent Labs Lab 08/07/13 1556 08/07/13 2153 08/08/13 0707 08/08/13 1154 08/11/13 2224 08/12/13 0802  GLUCAP 160* 134* 139* 166* 169* 130*   Coagulation:  Recent Labs Lab 08/05/13 1629  LABPROT 13.6  INR 1.04   Urine Drug Screen: Drugs of Abuse     Component Value Date/Time   LABOPIA POSITIVE* 08/11/2013 1150   COCAINSCRNUR NONE DETECTED 08/11/2013 1150   LABBENZ NONE DETECTED 08/11/2013 1150   AMPHETMU NONE DETECTED 08/11/2013 1150   THCU NONE DETECTED 08/11/2013 1150   LABBARB NONE DETECTED 08/11/2013 1150    Urinalysis:  Recent Labs Lab 08/11/13 1150  COLORURINE YELLOW  LABSPEC 1.022  PHURINE 5.5  GLUCOSEU 100*  HGBUR NEGATIVE  BILIRUBINUR NEGATIVE  KETONESUR NEGATIVE  PROTEINUR >300*  UROBILINOGEN 0.2  NITRITE NEGATIVE  LEUKOCYTESUR NEGATIVE   Micro Results: Recent Results (from the past 240 hour(s))  MRSA PCR SCREENING     Status: Abnormal   Collection Time    08/11/13  8:22 PM      Result Value Ref Range Status   MRSA by PCR POSITIVE (*) NEGATIVE Final   Comment:            The GeneXpert MRSA Assay (FDA  approved for NASAL specimens     only), is one component of a     comprehensive MRSA colonization     surveillance program. It is not     intended to diagnose MRSA     infection nor to guide or     monitor treatment for     MRSA infections.     RESULT CALLED TO, READ BACK BY AND VERIFIED WITHAngelina Sheriff:     C BOKIAGON RN [id number] 08/11/13 A BROWNING   Studies/Results: Ct Head Wo Contrast  08/11/2013   EXAM: CT HEAD WITHOUT CONTRAST  TECHNIQUE: Contiguous axial images were obtained from the base of the skull through the vertex without intravenous contrast.  COMPARISON:  Noncontrast CT scan of the brain of March 30, 2012  FINDINGS: There is mild diffuse cerebral and cerebellar atrophy. Since the previous study there has developed an approximately 7 mm diameter hypodensity in the anterior limb of the right internal capsule consistent with a lacunar  infarction. There is decreased density in the deep white matter of both cerebral hemispheres consistent with chronic small vessel ischemia. There is no intracranial hemorrhage. The cerebellum and brainstem are unremarkable.  There is mucoperiosteal thickening within the maxillary sinuses. There is no acute skull fracture.  IMPRESSION: 1. There is no acute ischemic or hemorrhagic event. There is no intracranial mass effect or hydrocephalus. 2. There is a subcentimeter lacunar infarction in the anterior limb of the right internal capsule new since March of 2014. There are stable changes of chronic small vessel ischemia.   Electronically Signed   By: David  [place]   On: 08/11/2013 13:29   Medications: I have reviewed the patient's current medications. Scheduled Meds: . amLODipine  10 mg Oral Daily  . aspirin EC  81 mg Oral Daily  . atenolol  100 mg Oral Daily  . Chlorhexidine Gluconate Cloth  6 each Topical Q0600  . clonazePAM  0.25 mg Oral BID  . haloperidol  15 mg Oral BID  . heparin  5,000 Units Subcutaneous 3 times per day  . hydrALAZINE  50 mg Oral TID  . insulin aspart  0-9 Units Subcutaneous TID WC  . lisinopril  10 mg Oral Daily  . morphine  15 mg Oral Q12H  . mupirocin ointment  1 application Nasal BID  . OXcarbazepine  450 mg Oral BID  . pantoprazole  40 mg Oral Daily  . tamsulosin  0.4 mg Oral QPC supper  . trihexyphenidyl  2.5 mg Oral Q breakfast   And  . trihexyphenidyl  5 mg Oral QHS  . vitamin B-12  1,000 mcg Oral Daily   Continuous Infusions: . sodium chloride Stopped (08/12/13 0005)   PRN Meds:.acetaminophen, HYDROcodone-acetaminophen, ondansetron, polyethylene glycol Assessment/Plan: Active Problems:   Hypertension   Schizophrenia, paranoid type   Type II or unspecified type diabetes mellitus without mention of complication, uncontrolled   Normocytic anemia   Periprosthetic hip fracture--nondisplaced, Left   Hyponatremia   Acute renal failure superimposed on stage 3  chronic kidney disease  [name] is a paranoid schizophrenic with a recent nondisplaced periprosthetic proximal femoral fracture returned to hospital from ALF and readmitted for acute on chronic renal failure. Difficult situation as he does not wish to the in the hospital and wishes to be back at his ALF and occasionally gets very upset when asked too many questions.   Acute on chronic CKD 3--improving.  GFR up to 34 today with improved Cr from 2.38 on admission to 1.98 today with  hydration. Likely pre-renal in setting of decreased po intake, FENA 0.63%. Baseline ~1.3-1.6.  Unfortunately, IV lost yesterday and he refuses for it to be replaced. Will encourage fluid po intake as tolerated. Of note, on ACEi, but improving while continued. -continue fluid hydration po vs. IV (if he allows placement) -trend renal function (if he allows blood draws)  L Nondisplaced periprosthetic proximal femur fracture--last seen by Dr. [name] last week during prior admission. Concern that prior implant may be loose and likely will need revision surgery. However, given mental illness and behavior issues, he was deemed high risk for complications and recommended for NWB for at least 6 weeks with continued PT to see if it can heal and stabilize.  However, may need [facility]  -I have asked ortho if possible to reassess Mr. Groesbeck since he is back for readmission. His family last time (also physicians were concerned that if he needs surgery to possibly get sooner than later, however understood the concerns on trying to wait and see if it improves) -Ortho PA asked to re-order pelvis and hip xrays for further assessment which has been done -ultimately, if he is still not candidate for surgery at this time, he will need to continue to work with PT, pain control, remain touchdown or NWB status for total 5 more weeks and follow up with ortho outpatient -pain control with MS contin and norco  prn -fall precautions -ambulate with assitance -PT  Paranoid schizophrenia--at ALF, has Manufacturing engineer. Agitated at times but alert and oriented to person place and time. Does not like being in the hospital. Refuses IV and lab draws.  -continue home medications including: klonopin, haldol, trileptal,  And artane -haldol prn agitation  HTN--elevated today in setting of agitation.  -continue home ACEi (renal function improving with hydration, likely not source of ATN), hydralazine, and norvasc -control agitation--continue home medications including haldol  Lacunar infarction--subcentimeter, anterior limb of RIC new since March 2014. Likely small vessel disease in setting. Does have HTN.  -will try to control BP as much as he allows  Diet: Carb modified DVT Ppx: Heparin Dispo: Disposition is deferred at this time, possible d/c back to ALF with PT this weekend pending ortho assessment. Will try to discuss care with family again today. Appreciate social work assistance.   The patient does not have a current PCP (Provider Default, MD) and does not need an [facility] hospital follow-up appointment after discharge. Will have follow up at ALF.   The patient does have transportation limitations that hinder transportation to clinic appointments.  Services Needed at time of discharge: Y = Yes, Blank = No PT:   OT:   RN:   Equipment:   Other:     LOS: 1 day   Darden Palmer, MD 08/12/2013, 11:21 AM

## 2013-08-12 NOTE — Progress Notes (Signed)
08/12/13 00:35 Patient pulled iv out,attempted to place another iv but patient refused. Madden Garron, Drinda Buttsharito Joselita, RCharity fundraiser

## 2013-08-12 NOTE — H&P (Signed)
Attending physician admission note: I personally examined this patient and reviewed pertinent medical data. History, physical findings, problem assessment and management plan accurate as recorded by resident physician Dr. Gara Kroneriana Truong. Clinical summary: Unfortunate 63 year old hypertensive, diabetic, man originally from [place] who is quite intelligent but suffers from paranoid schizophrenia. He underwent a left hip replacement on 03/31/2012 for a traumatic fracture. He fell again recently and was admitted on July 11 and was found to have a non-displaced proximal femoral fracture adjacent to the prosthetic hip. Recommendation was non weightbearing with consideration of stabilization surgery in the near future. He was referred back to the hospital when one of his caregivers thought that his left leg was swollen to assess whether or not he had a thrombotic event.  Emergency department physician was concerned that he was less agitated than his baseline and obtained a noncontrast CT scan of the brain which did show a new 7 mm lacunar infarct anterior limb right internal capsule new compared with a March 2014 study. Additional changes of chronic small vessel ischemia. Current exam: He is quite agitated but did calm down considerably when I was talking with him and examining him. Blood pressure 189/93, pulse 80, temperature 97.5 F (36.4 C), temperature source Oral, resp. rate 16, height 6' (1.829 m), weight 203 lb 0.7 oz (92.1 kg), SpO2 92.00%. His lungs are clear. Regular cardiac rhythm no murmur. He has edema of the left ankle but the left calf is supple and nontender. No tenderness on palpation along the left femoral canal. Musculoskeletal: I gently did inversion and eversion motions at the left hip which did not elicit any pain. Impression: #1. Recurrent traumatic fracture left hip We are going to ask orthopedic surgery if they want to reevaluate him while he is in the hospital for a surgical  stabilization procedure to try to avoid repetitive admissions. #2. Subacute lacunar infarct in a hypertensive diabetic man.  No gross focal neurologic deficits at this time. #3. Rule out deep venous thrombosis No clinical suspicion for this at this time. #4. Paranoid schizophrenia Continue his current medications. As needed haloperidol for periods of extreme agitation.

## 2013-08-13 DIAGNOSIS — F200 Paranoid schizophrenia: Secondary | ICD-10-CM | POA: Diagnosis not present

## 2013-08-13 DIAGNOSIS — I129 Hypertensive chronic kidney disease with stage 1 through stage 4 chronic kidney disease, or unspecified chronic kidney disease: Secondary | ICD-10-CM | POA: Diagnosis not present

## 2013-08-13 DIAGNOSIS — I6350 Cerebral infarction due to unspecified occlusion or stenosis of unspecified cerebral artery: Secondary | ICD-10-CM | POA: Diagnosis not present

## 2013-08-13 DIAGNOSIS — N179 Acute kidney failure, unspecified: Secondary | ICD-10-CM | POA: Diagnosis not present

## 2013-08-13 LAB — BASIC METABOLIC PANEL
Anion gap: 16 — ABNORMAL HIGH (ref 5–15)
BUN: 34 mg/dL — AB (ref 6–23)
CO2: 24 mEq/L (ref 19–32)
CREATININE: 2.14 mg/dL — AB (ref 0.50–1.35)
Calcium: 8.4 mg/dL (ref 8.4–10.5)
Chloride: 92 mEq/L — ABNORMAL LOW (ref 96–112)
GFR calc Af Amer: 36 mL/min — ABNORMAL LOW (ref >90)
GFR calc non Af Amer: 31 mL/min — ABNORMAL LOW (ref >90)
GLUCOSE: 173 mg/dL — AB (ref 70–99)
Potassium: 4 mEq/L (ref 3.7–5.3)
Sodium: 132 mEq/L — ABNORMAL LOW (ref 137–147)

## 2013-08-13 LAB — GLUCOSE, CAPILLARY
Glucose-Capillary: 140 mg/dL — ABNORMAL HIGH (ref 70–99)
Glucose-Capillary: 131 mg/dL — ABNORMAL HIGH (ref 70–99)

## 2013-08-13 NOTE — Evaluation (Signed)
Physical Therapy Evaluation Patient Details Name: David Lang MRN: [id number] DOB: 08/11/1950 Today's Date: 08/13/2013   History of Present Illness  63 y.o. male with nondisplaced periprosthetic proximal femoral fracture on left. Hx of hemiarthroplasty in march of 2014. Additionally, has significant history of schizophrenia, HTN, and diabetes. was admitted recently, sent back to ALF with extra assist, and now cargivers sent him back to Hospital due to concern re swelling   Clinical Impression   Pt admitted with above. Pt currently with functional limitations due to the deficits listed below (see PT Problem List).  Pt will benefit from skilled PT to increase their independence and safety with mobility to allow discharge to the venue listed below.       Follow Up Recommendations SNF;Supervision/Assistance - 24 hour    Equipment Recommendations  Wheelchair (measurements PT);Wheelchair cushion (measurements PT);Rolling walker with 5" wheels;3in1 (PT)    Recommendations for Other Services       Precautions / Restrictions Precautions Precautions: Fall Restrictions LLE Weight Bearing: Non weight bearing      Mobility  Bed Mobility Overal bed mobility: Needs Assistance;+2 for physical assistance Bed Mobility: Supine to Sit     Supine to sit: +2 for physical assistance;Max assist     General bed mobility comments: Requires mas assist of 2 for LLE support, trunk elevation and to spuare off hips at EOB; Shows good sitting blanbe once upright on EOB  Transfers Overall transfer level: Needs assistance Equipment used: 2 person hand held assist Transfers: Squat Pivot Transfers   Stand pivot transfers: +2 physical assistance;Max assist       General transfer comment: Basic pivot transfer towards chair on pt's Right; L knee blocked and second person to provide support at trunk and monitor to keep weight off of LLE  Ambulation/Gait                Stairs             Wheelchair Mobility    Modified Rankin (Stroke Patients Only)       Balance Overall balance assessment: Needs assistance Sitting-balance support: Bilateral upper extremity supported;Feet supported Sitting balance-Leahy Scale: Fair                                       Pertinent Vitals/Pain Painful LLE during transition movements; pt called out during transfer; patient repositioned for comfort, and asleep in chair at end of session    Home Living Family/patient expects to be discharged to:: Skilled nursing facility   Available Help at Discharge: Skilled Nursing Facility Type of Home: Skilled Nursing Facility           Additional Comments: Pt unable to answer most questions concerning home living. [place]    Prior Function Level of Independence: Needs assistance         Comments: Required max assist for transfers during previous admission     Hand Dominance        Extremity/Trunk Assessment   Upper Extremity Assessment: Generalized weakness (Minimal use of UEs for mobiltiy)           Lower Extremity Assessment: LLE deficits/detail;Generalized weakness   LLE Deficits / Details: Decreased strength and ROM as expected post fx - limited assessment due to pain.     Communication   Communication: Other (comment) (Speaks English with heavy  accent)  Cognition Arousal/Alertness: Lethargic (but easily arousable) Behavior During Therapy: Flat affect Overall Cognitive Status: History of cognitive impairments - at baseline       Memory: Decreased recall of precautions;Decreased short-term memory              General Comments      Exercises        Assessment/Plan    PT Assessment Patient needs continued PT services  PT Diagnosis Generalized weakness;Acute pain   PT Problem List Decreased strength;Decreased range of motion;Decreased activity tolerance;Decreased  balance;Decreased mobility;Decreased cognition;Decreased knowledge of use of DME;Decreased safety awareness;Decreased knowledge of precautions;Pain  PT Treatment Interventions DME instruction;Functional mobility training;Therapeutic activities;Therapeutic exercise;Balance training;Neuromuscular re-education;Cognitive remediation;Patient/family education;Wheelchair mobility training;Modalities   PT Goals (Current goals can be found in the Care Plan section) Acute Rehab PT Goals Patient Stated Goal: Did not state PT Goal Formulation: Patient unable to participate in goal setting Time For Goal Achievement: 08/27/13 Potential to Achieve Goals: Fair    Frequency Min 3X/week   Barriers to discharge        Co-evaluation               End of Session Equipment Utilized During Treatment: Gait belt Activity Tolerance: Patient limited by lethargy Patient left: in chair;with call bell/phone within reach Nurse Communication: Mobility status;Need for lift equipment    Functional Assessment Tool Used: clinical judgement Functional Limitation: Mobility: Walking and moving around Mobility: Walking and Moving Around Current Status [phone]): At least 60 percent but less than 80 percent impaired, limited or restricted Mobility: Walking and Moving Around Goal Status [phone]): At least 1 percent but less than 20 percent impaired, limited or restricted    Time: 1157-1215 PT Time Calculation (min): 18 min   Charges:   PT Evaluation $Initial PT Evaluation Tier I: 1 Procedure PT Treatments $Therapeutic Activity: 8-22 mins   PT G Codes:   Functional Assessment Tool Used: clinical judgement Functional Limitation: Mobility: Walking and moving around    Van Clines Hamff 08/13/2013, 5:31 PM  Van Clines, PT  Acute Rehabilitation Services Pager [phone] Office [phone]

## 2013-08-13 NOTE — Discharge Summary (Signed)
Name: David Lang MRN: [id number] DOB: 08-11-1950 63 y.o. PCP: Provider Default, MD  Date of Admission: 08/11/2013 10:23 AM Date of Discharge: 08/14/2013 Attending Physician: Levert Feinstein, MD  Discharge Diagnosis: Active Problems:   Hypertension   Schizophrenia, paranoid type   Type II or unspecified type diabetes mellitus without mention of complication, uncontrolled   Normocytic anemia   Periprosthetic hip fracture--nondisplaced, Left   Hyponatremia   Acute renal failure superimposed on stage 3 chronic kidney disease  Discharge Medications:   Medication List         acetaminophen 325 MG tablet  Commonly known as:  TYLENOL  Take 650 mg by mouth every 6 (six) hours as needed for mild pain, fever or headache.     amLODipine 10 MG tablet  Commonly known as:  NORVASC  Take 1 tablet (10 mg total) by mouth daily.     aspirin EC 81 MG tablet  Take 81 mg by mouth daily.     atenolol 100 MG tablet  Commonly known as:  TENORMIN  Take 1 tablet (100 mg total) by mouth daily.     clonazePAM 0.5 MG tablet  Commonly known as:  KLONOPIN  Take 0.5 tablets (0.25 mg total) by mouth 2 (two) times daily.     cloNIDine 0.3 mg/24hr patch  Commonly known as:  CATAPRES - Dosed in mg/24 hr  Place 1 patch (0.3 mg total) onto the skin once a week.     cyanocobalamin 1000 MCG tablet  Take 1 tablet (1,000 mcg total) by mouth daily.     haloperidol 10 MG tablet  Commonly known as:  HALDOL  Take 15 mg by mouth 2 (two) times daily.     haloperidol 2 MG tablet  Commonly known as:  HALDOL  Take 2 mg by mouth daily as needed (for increased psychosis/hallucinations with agitation or aggression.).     hydrALAZINE 50 MG tablet  Commonly known as:  APRESOLINE  Take 50 mg by mouth 3 (three) times daily.     HYDROcodone-acetaminophen 5-325 MG per tablet  Commonly known as:  NORCO/VICODIN  Take 1 tablet by mouth every 8 (eight) hours as needed (for breakthrough pain).     lisinopril 10  MG tablet  Commonly known as:  PRINIVIL,ZESTRIL  Take 10 mg by mouth daily.     loperamide 2 MG capsule  Commonly known as:  IMODIUM  Take 2 mg by mouth 3 (three) times daily as needed for diarrhea or loose stools.     morphine 15 MG 12 hr tablet  Commonly known as:  MS CONTIN  Take 1 tablet (15 mg total) by mouth every 12 (twelve) hours.     ondansetron 4 MG tablet  Commonly known as:  ZOFRAN  Take 1 tablet (4 mg total) by mouth every 6 (six) hours as needed for nausea.     OXcarbazepine 150 MG tablet  Commonly known as:  TRILEPTAL  Take 3 tablets (450 mg total) by mouth 2 (two) times daily.     OYSTER SHELL CALCIUM PO  Take 1 tablet by mouth daily.     pantoprazole 40 MG tablet  Commonly known as:  PROTONIX  Take 1 tablet (40 mg total) by mouth daily.     polyethylene glycol packet  Commonly known as:  MIRALAX / GLYCOLAX  Take 17 g by mouth daily as needed for mild constipation.     spironolactone 25 MG tablet  Commonly known as:  ALDACTONE  Take 12.5 mg  by mouth daily.     tamsulosin 0.4 MG Caps capsule  Commonly known as:  FLOMAX  Take 0.4 mg by mouth.     trihexyphenidyl 5 MG tablet  Commonly known as:  ARTANE  Take 2.5-5 mg by mouth See admin instructions. Take 2.5mg  by mouth in the morning and take 5mg  by mouth at bedtime.       Disposition and follow-up:   David Lang [facility] in Stable condition.  At the hospital follow up visit please address:  1. David Lang had 2 hospitalizations in the past 2 weeks. He has a nondisplaced periprosthetic proximal femoral fracture- may ultimately need revision surgery, but needs NWB for at least 5 more weeks (for a total of 6 weeks) with physical therapy work to maintain touchdown or nonweightbearing status. Follow up is set with Dr. Ave Filterhandler in office on August 5th; repeat xrays will be needed at that time.  The patient was put on moderate scale Novolog on last admission, which was  continued on this admission; we were unable to draw a HbA1c during this hospitalization, but recommend following.   Please also follow creatinine (1.98 to 2.14 on this admission; 2.6 on last discharge); unable to trend on this hospitalization due to patient refusal for blood draw.  Sodium was also low on this and last admission (132 this week, as low as 128 last admission); unable to trend on this hospitalization due to patient refusal for blood draw.  2. Labs / imaging needed at time of follow-up: Xrays of hip at orthopaedics appointment. HbA1c, BMET (to monitor renal function) and sodium.  3. Pending labs/ test needing follow-up: none  Follow-up Appointments: Follow-up Information   Follow up with Mable ParisHANDLER,JUSTIN WILLIAM, MD On 08/30/2013. (10:45 AM)    Specialty:  Orthopedic Surgery   Contact information:   [address] [place] [address] [phone]       [facility] with [facility].   Contact information:   [address] [place] [id number] [phone]      Discharge Instructions: Please continue non-weight-bearing physical therapy for a total of 6 weeks (5 more weeks). Dr. Ave Filterhandler will reassess your hip during your appointment August 5th at 10:45AM.   Consultations:  orthopaedics  Procedures Performed:  Dg Hip Complete Left (PATIENT REFUSED REPEAT XR, so this is from last admission): 08/05/2013. No acute abnormality, left hip replacement with good anatomic position of prosthesis.    Ct Head Wo Contrast: 08/11/2013   There is no acute ischemic or hemorrhagic event. There is no intracranial mass effect or hydrocephalus. 2. There is a subcentimeter lacunar infarction in the anterior limb of the right internal capsule new since March of 2014. There are stable changes of chronic small vessel ischemia.     Ct Pelvis Wo Contrast: 08/05/2013  (from last admission): Bipolar left hip hemiarthroplasty is in place. Nondisplaced  periprosthetic proximal femoral fracture. No other acute abnormality.     Admission HPI: Pt is a 63 y/o M w/ PMHx of HTN, DM, and paranoid schizophrenia who [facility]/ a swollen left [facility]. He had left hip arthroplasty on 03/2012 and was recently admitted on 7/11 with a nondisplaced periprosthetic proximal femoral fracture s/p fall. It was recommend that patient have non-weight bearing activities x 6 weeks and then reassess need for revision surgery. Pt has an appointment with Dr. Ave Filterhandler on 8/5 for this. Pt does not  complain of any pain but per caretaker his left leg has been more swollen than the left. The ED was concerned that the patient was less agitated than his baseline and performed at CT brain which showed a 7mm lacunar infarct anterior limb right internal capsule, new compared with 03/2012 study, along with chronic small vessel ischemia.  Hospital Course by problem list: Active Problems:   Hypertension   Schizophrenia, paranoid type   Type II or unspecified type diabetes mellitus without mention of complication, uncontrolled   Normocytic anemia   Periprosthetic hip fracture--nondisplaced, Left   Hyponatremia   Acute renal failure superimposed on stage 3 chronic kidney disease   David Lang is a paranoid schizophrenic with a recent nondisplaced periprosthetic proximal femoral fracture who returned to the hospital from his ALF and was readmitted for acute on chronic renal failure. His case is difficult in that he does not wish to the in the hospital and has been refusing IV, blood draws and imaging. He has, however, participated with physical therapy during thid admission. There have been lengthy discussions with his family this week and ultimately, it was decided that SNF placement is most appropriate at this time.   Acute on chronic CKD 3--Cr improved from 2.38 on admission to 1.98 with hydration. Then, after IV was refused by the patient  and IVF had to be stopped, Cr rose to 2.14. Patient has not allowed further labs, but this was likely pre-renal in the setting of decreased po intake. Encouraging fluid intake PO and Ensure Complete TID was added. Of note, the patient is on ACEi.    L Nondisplaced periprosthetic proximal femur fracture--last imaged and seen by Dr. [name] last week during prior admission. Concern that prior implant may be loose and may need revision surgery. However, given mental illness and behavior issues, he was deemed high risk for complications and recommended for NWB for at least 6 weeks with continued PT (bed to chair and bed to wheel chair) to see if it can heal and stabilize. If he needs surgery, [facility]ics. Orthopaedics was consulted again on this admission and relays that he remains a poor candidate for surgery at this time, so the plan is for him to continue to work with PT, achieve pain control, remain touchdown or NWB status for total 5 more weeks and follow up with ortho outpatient as scheduled. The patient's lack of pain on spontaneous movement is consistent in their opinion with a nondisplaced femur.  Paranoid schizophrenia--Agitated at times but alert and oriented to person place and time. Does not like being in the hospital. Refuses IV, lab draws and imaging.   HTN--elevated in setting of agitation during this admission, home ACEi was continued as were home hydralazine, and norvasc   Lacunar infarction--subcentimeter, anterior limb of RIC new since March 2014. Likely small vessel disease in setting of HTN  Pertinent Discharge Labs:   Basic Metabolic Panel Lab  08/12/13 0456  08/13/13 0340   NA  136*  132*   K  4.2  4.0   CL  97  92*   CO2  24  24   GLUCOSE  126*  173*   BUN  34*  34*   CREATININE  1.98*  2.14*   CALCIUM  9.0  8.4     Signed: Dionne Ano, MD 08/14/2013, 11:51 AM    Services Ordered on Discharge: ALF Equipment  Ordered on Discharge: wheelchair with wheelchair cushion

## 2013-08-13 NOTE — Progress Notes (Signed)
Subjective: Pt not able to communicate or unwilling today.  Pt refused x-ray yesterday  Objective: Vital signs in last 24 hours: Temp:  [97.9 F (36.6 C)-98.4 F (36.9 C)] 98.4 F (36.9 C) (07/19 0500) Pulse Rate:  [70-80] 80 (07/19 0500) Resp:  [18] 18 (07/19 0500) BP: (140-164)/(73-87) 162/73 mmHg (07/19 0500) SpO2:  [94 %-96 %] 94 % (07/19 0500) Weight:  [196 lb 10.4 oz (89.2 kg)] 196 lb 10.4 oz (89.2 kg) (07/18 2023)  Intake/Output from previous day: 07/18 0701 - 07/19 0700 In: 840 [P.O.:840] Out: 450 [Urine:450] Intake/Output this shift: Total I/O In: -  Out: 600 [Urine:600]   Recent Labs  08/11/13 1218  HGB 10.8*    Recent Labs  08/11/13 1218  WBC 9.7  RBC 3.80*  HCT 33.1*  PLT 406*    Recent Labs  08/12/13 0456 08/13/13 0340  NA 136* 132*  K 4.2 4.0  CL 97 92*  CO2 24 24  BUN 34* 34*  CREATININE 1.98* 2.14*  GLUCOSE 126* 173*  CALCIUM 9.0 8.4   No results found for this basename: LABPT, INR,  in the last 72 hours  Neurovascular intact Intact pulses distally Dorsiflexion/Plantar flexion intact No cellulitis present Compartment soft l hip mild pain with rotation, -heel strike, min sts  Assessment/Plan: l hip s/p hemi-arthroplasty with CT 1 wek ago showing vertical crack not going below implant.  Pt started on non-wt bearing and has f/u in our office but [facility].  I am unable to get meaningful history and exam from patient but he does spontaneously move l hip in way that appears not painful.// Obviously i can not evaluate patient fully without exam and history and no x-ray but my thought is that he has not displaced femur or he would have more pain.  I would keep patient bed to chair and bed to wheel chair for 6 weeks and f/u Ave Filterhandler in our office in 10 days for x-ray.  Be happy to repeta eval if patient willing and x-ray can be obtained.   Jaisen Wiltrout L 08/13/2013, 10:16 AM

## 2013-08-13 NOTE — Progress Notes (Signed)
Subjective: David Lang was very quiet this morning; he did not answer any questions or engage in any discussion. He took off his gown during the interview. He did not endorse pain, but also did not deny it.   The patient is on contact isolation for MRSA and a sitter was with him in his room.  Objective: Vital signs in last 24 hours: Filed Vitals:   08/12/13 1000 08/12/13 1631 08/12/13 2023 08/13/13 0500  BP: 189/93 140/82 164/87 162/73  Pulse: 80  70 80  Temp: 97.5 F (36.4 C)  97.9 F (36.6 C) 98.4 F (36.9 C)  TempSrc: Oral  Oral Oral  Resp: 16  18 18   Height:      Weight:   196 lb 10.4 oz (89.2 kg)   SpO2: 92%  96% 94%   Weight change: -6 lb 6.3 oz (-2.9 kg)  Intake/Output Summary (Last 24 hours) at 08/13/13 0839 Last data filed at 08/13/13 0835  Gross per 24 hour  Intake    840 ml  Output    850 ml  Net    -10 ml   Vitals reviewed. General: resting in bed with the TV on, then stripping his gown, sitter beside him HEENT: EOMI, wearing glasses Cardiac: did not let me auscultate Pulm: did not let me examined Abd: unable to examine Ext: extremities soft, non-tender to palpation, mild edema around ankles, no erythema, symmetrical. Supposed to be NWB Neuro/Psych: alert and oriented X3, paranoid schizophrenic, often efuses lab draws and IV, but allowed labs this morning, cooperative when directed  Lab Results: Basic Metabolic Panel:  Recent Labs Lab 08/12/13 0456 08/13/13 0340  NA 136* 132*  K 4.2 4.0  CL 97 92*  CO2 24 24  GLUCOSE 126* 173*  BUN 34* 34*  CREATININE 1.98* 2.14*  CALCIUM 9.0 8.4   Liver Function Tests:  Recent Labs Lab 08/11/13 1218  AST 8  ALT 8  ALKPHOS 87  BILITOT 0.3  PROT 6.2  ALBUMIN 2.8*    Recent Labs Lab 08/11/13 1218  AMMONIA 28   CBC:  Recent Labs Lab 08/11/13 1218  WBC 9.7  NEUTROABS 6.1  HGB 10.8*  HCT 33.1*  MCV 87.1  PLT 406*   CBG:  Recent Labs Lab 08/11/13 2224 08/12/13 0802 08/12/13 1228  08/12/13 1619 08/12/13 2132 08/13/13 0750  GLUCAP 169* 130* 166* 208* 140* 140*   Urine Drug Screen: Drugs of Abuse     Component Value Date/Time   LABOPIA POSITIVE* 08/11/2013 1150   COCAINSCRNUR NONE DETECTED 08/11/2013 1150   LABBENZ NONE DETECTED 08/11/2013 1150   AMPHETMU NONE DETECTED 08/11/2013 1150   THCU NONE DETECTED 08/11/2013 1150   LABBARB NONE DETECTED 08/11/2013 1150    Urinalysis:  Recent Labs Lab 08/11/13 1150  COLORURINE YELLOW  LABSPEC 1.022  PHURINE 5.5  GLUCOSEU 100*  HGBUR NEGATIVE  BILIRUBINUR NEGATIVE  KETONESUR NEGATIVE  PROTEINUR >300*  UROBILINOGEN 0.2  NITRITE NEGATIVE  LEUKOCYTESUR NEGATIVE   Micro Results: Recent Results (from the past 240 hour(s))  MRSA PCR SCREENING     Status: Abnormal   Collection Time    08/11/13  8:22 PM      Result Value Ref Range Status   MRSA by PCR POSITIVE (*) NEGATIVE Final   Comment:            The GeneXpert MRSA Assay (FDA     approved for NASAL specimens     only), is one component of a  comprehensive MRSA colonization     surveillance program. It is not     intended to diagnose MRSA     infection nor to guide or     monitor treatment for     MRSA infections.     RESULT CALLED TO, READ BACK BY AND VERIFIED WITHAngelina Sheriff RN 1610 08/11/13 A BROWNING   Studies/Results: Ct Head Wo Contrast  08/11/2013   EXAM: CT HEAD WITHOUT CONTRAST  TECHNIQUE: Contiguous axial images were obtained from the base of the skull through the vertex without intravenous contrast.  COMPARISON:  Noncontrast CT scan of the brain of March 30, 2012  FINDINGS: There is mild diffuse cerebral and cerebellar atrophy. Since the previous study there has developed an approximately 7 mm diameter hypodensity in the anterior limb of the right internal capsule consistent with a lacunar infarction. There is decreased density in the deep white matter of both cerebral hemispheres consistent with chronic small vessel ischemia. There is no  intracranial hemorrhage. The cerebellum and brainstem are unremarkable.  There is mucoperiosteal thickening within the maxillary sinuses. There is no acute skull fracture.  IMPRESSION: 1. There is no acute ischemic or hemorrhagic event. There is no intracranial mass effect or hydrocephalus. 2. There is a subcentimeter lacunar infarction in the anterior limb of the right internal capsule new since March of 2014. There are stable changes of chronic small vessel ischemia.   Electronically Signed   By: David  [place]   On: 08/11/2013 13:29   Medications: I have reviewed the patient's current medications. Scheduled Meds: . amLODipine  10 mg Oral Daily  . aspirin EC  81 mg Oral Daily  . atenolol  100 mg Oral Daily  . bisacodyl  10 mg Rectal Once  . Chlorhexidine Gluconate Cloth  6 each Topical Q0600  . clonazePAM  0.25 mg Oral BID  . haloperidol  15 mg Oral BID  . heparin  5,000 Units Subcutaneous 3 times per day  . hydrALAZINE  50 mg Oral TID  . insulin aspart  0-9 Units Subcutaneous TID WC  . lisinopril  10 mg Oral Daily  . morphine  15 mg Oral Q12H  . mupirocin ointment  1 application Nasal BID  . OXcarbazepine  450 mg Oral BID  . pantoprazole  40 mg Oral Daily  . tamsulosin  0.4 mg Oral QPC supper  . trihexyphenidyl  2.5 mg Oral Q breakfast   And  . trihexyphenidyl  5 mg Oral QHS  . vitamin B-12  1,000 mcg Oral Daily   Continuous Infusions: . sodium chloride Stopped (08/12/13 0005)   PRN Meds:.acetaminophen, HYDROcodone-acetaminophen, ondansetron, polyethylene glycol Assessment/Plan: Active Problems:   Hypertension   Schizophrenia, paranoid type   Type II or unspecified type diabetes mellitus without mention of complication, uncontrolled   Normocytic anemia   Periprosthetic hip fracture--nondisplaced, Left   Hyponatremia   Acute renal failure superimposed on stage 3 chronic kidney disease  Mr. Mikes is a paranoid schizophrenic with a recent nondisplaced periprosthetic proximal  femoral fracture returned to hospital from ALF and readmitted for acute on chronic renal failure. Difficult situation as he does not wish to the in the hospital and occasionally gets very upset when asked too many questions. Allowed a blood draw this morning, but unresponsive to questions today.   Acute on chronic CKD 3-- Cr improved from 2.38 on admission to 1.98 yesterday with hydration. Then, without hydration yesterday, Cr rose to 2.14. Likely pre-renal in setting of  decreased po intake, FENA on this admission 0.63%. Baseline ~1.3-1.6.  Unfortunately, IV lost on day of admission and he refuses for it to be replaced. Will encourage fluid po intake as tolerated. Of note, on ACEi, but improving while continued. - Continue fluid hydration po (as he is refusing IV placement) - Trend renal function (if he allows blood draws)   L Nondisplaced periprosthetic proximal femur fracture--last seen by Dr. Ave Filterhandler last week during prior admission. Concern that prior implant may be loose and may need revision surgery. However, given mental illness and behavior issues, he was deemed high risk for complications and recommended for NWB for at least 6 weeks with continued PT to see if it can heal and stabilize.  However, may need surgery, [facility].  - Ortho to reassess today; his family (which consists of an internist and pediatrician), were concerned that if he needs surgery, perhaps he should get it sooner rather than waiting; they were reasonable and understood the risks and the possibility that waiting with PT might be just as advantageous).  - Ortho PA asked to re-order pelvis and hip xrays for further assessment; patient refused yesterday once in XR - Ultimately, if he is still not candidate for surgery at this time, he will need to continue to work with PT, pain control, remain touchdown or NWB status for total 5 more weeks and follow up with ortho outpatient - Pain  control with MS contin and norco prn - Fall precautions - Ambulate with assistance - Sitter in room - PT  Paranoid schizophrenia--at ALF, has Manufacturing engineerpersonal assistant. Agitated at times but alert and oriented to person place and time. Does not like being in the hospital. Refuses IV and lab draws.  - Continue home medications including: klonopin, haldol, trileptal,  And artane - Haldol prn agitation  HTN--elevated today in setting of agitation.  - Continue home ACEi (renal function improving with hydration, likely not source of ATN), hydralazine, and norvasc - Control agitation--continue home medications including haldol  Lacunar infarction--subcentimeter, anterior limb of RIC new since March 2014. Likely small vessel disease in setting. Does have HTN.  - Will try to control BP as much as he allows  Diet: Carb modified  DVT Ppx: Heparin  Dispo: Family (sister, Lela) is in agreement with medical team and physical therapy (from last admission) that patient would benefit from SNF placement for higher level of skilled nursing than is possible in current ALF. He will need continued NWB PT at this facility until his scheduled orthopaedics appointment. Will try to discuss care with family again today. Appreciate social work assistance.   The patient does not have a current PCP (Provider Default, MD) and does not need an [facility] after discharge. Will have follow up at SNF.  The patient does have transportation limitations that hinder transportation to clinic appointments.  Services Needed at time of discharge: Y = Yes, Blank = No PT:   OT:   RN:   Equipment:   Other:     LOS: 2 days   Dionne AnoJulia Nyajah Hyson, MD 08/13/2013, 8:39 AM

## 2013-08-13 NOTE — Progress Notes (Signed)
Utilization review completed.  

## 2013-08-13 NOTE — Clinical Social Work Psychosocial (Signed)
Clinical Social Work Department BRIEF PSYCHOSOCIAL ASSESSMENT 08/13/2013  Patient:  Potenza,Andrell     Account Number:  [id number]     Admit date:  08/11/2013  Clinical Social Worker:  Earnestine LeysPatrick-jefferson,Shaurya Rawdon, LCSWA  Date/Time:  08/13/2013 10:10 AM  Referred by:  Physician  Date Referred:  08/13/2013 Referred for  SNF Placement   Other Referral:   Interview type:  Family Other interview type:   Charlett NoseLeila Mason (sister) [phone]    PSYCHOSOCIAL DATA Living Status:  FACILITY [facility] [place] Level of care:  Assisted Living Primary support name:  Ferne ReusNajwa Shammas [facility](HCPOA) Primary support relationship to patient:  SIBLING Degree of support available:   Good.    CURRENT CONCERNS Current Concerns  Post-Acute Placement   Other Concerns:    SOCIAL WORK ASSESSMENT / PLAN CSW attempted to meet with patient, however, patient communicated with muffled sounds. CSW contacted patient's sister [name]([name]). Per Dorna MaiLeila, patient has [facility] since June [facility]. Sister stated the plan is for patient to d/c to a SNF, per last conversation with medical staff. [name] reported, patient's HCPOA ([name][place] in [place], but they both discuss patient's medical state, before [name] makes a decision. Per sister, she and [name] were paying for additional staff to assist patient while at ALF, such as a Manufacturing engineerpersonal assistant, and it was costly. [name] reported she and sister prefer patient to receive PT at a SNF, if recommended. Patient's sister stated she and sister have been trying to find a placement for patient at a SNF, but it has been difficult. CSW provided appropriate emotional support and made patient's sister aware of CSW role in d/c planning process.   Assessment/plan status:  Information/[facility]erral to [facility] Other assessment/ plan:   CSW to complete FL2 for SNF placement.   Information/referral to community resources:   CSW to  provide patient's sister with list of community SNF.    PATIENT'S/FAMILY'S RESPONSE TO PLAN OF CARE: Patient's sister was pleasant and cooperative and thanked CSW for assistance with d/c plan.   Hoover Grewe Patrick-Jefferson, LCSWA Weekend Clinical Social Worker [phone]

## 2013-08-14 DIAGNOSIS — F200 Paranoid schizophrenia: Secondary | ICD-10-CM

## 2013-08-14 DIAGNOSIS — I6350 Cerebral infarction due to unspecified occlusion or stenosis of unspecified cerebral artery: Secondary | ICD-10-CM

## 2013-08-14 DIAGNOSIS — N179 Acute kidney failure, unspecified: Secondary | ICD-10-CM

## 2013-08-14 DIAGNOSIS — N183 Chronic kidney disease, stage 3 unspecified: Secondary | ICD-10-CM

## 2013-08-14 DIAGNOSIS — I10 Essential (primary) hypertension: Secondary | ICD-10-CM | POA: Diagnosis not present

## 2013-08-14 DIAGNOSIS — Z4789 Encounter for other orthopedic aftercare: Secondary | ICD-10-CM

## 2013-08-14 LAB — GLUCOSE, CAPILLARY
Glucose-Capillary: 110 mg/dL — ABNORMAL HIGH (ref 70–99)
Glucose-Capillary: 114 mg/dL — ABNORMAL HIGH (ref 70–99)
Glucose-Capillary: 226 mg/dL — ABNORMAL HIGH (ref 70–99)
Glucose-Capillary: 190 mg/dL — ABNORMAL HIGH (ref 70–99)

## 2013-08-14 NOTE — Clinical Social Work Placement (Addendum)
Clinical Social Work Department CLINICAL SOCIAL WORK PLACEMENT NOTE 08/14/2013  Patient:  David Lang,David Lang  Account Number:  [id number] Admit date:  08/11/2013  Clinical Social Worker:  Genelle BalVANESSA Thressa Shiffer, LCSW  Date/time:  08/14/2013 02:09 AM  Clinical Social Work is seeking post-discharge placement for this patient at the following level of care:   SKILLED NURSING   (*CSW will update this form in Epic as items are completed)     Patient/family provided with [facility] Department of Clinical Social Work's list of facilities offering this level of care within the geographic area requested by the patient (or if unable, by the patient's family).  08/13/2013  Patient/family informed of their freedom to choose among providers that offer the needed level of care, that participate in Medicare, Medicaid or managed care program needed by the patient, have an available bed and are willing to accept the patient.    Patient/family informed of [facility]' ownership interest in [facility], as well as of the fact that they are under no obligation to receive care at this facility.  PASARR submitted to EDS on 08/13/2013 PASARR number received on 08/16/13 - [id number] F. Number valid from 7/22 - 10/15/13.  FL2 transmitted to all facilities in geographic area requested by pt/family on  08/13/2013 FL2 transmitted to all facilities within larger geographic area on   Patient informed that his/her managed care company has contracts with or will negotiate with  certain facilities, including the following:     Patient/family informed of bed offers received:  08/14/2013 [facility] Physician recommends and patient chooses bed at    [facility]   Patient to be transferred to facility by ambulance  Patient and family notified of transfer on 08/17/13 Name of family member notified:  David Lang - sister  The  following physician request were entered in Epic:  Additional Comments: 72015: CSW contacted patient's sister David Lang at [phone] to give bed offers. She talked with her sister who lives in [place]) and got back with CSW to give SNF decision. 08/15/13: CSW talked with Corliss ParishLaurie Lang [phone]), Level 2 PASARR evaluator regarding patient. She will be coming to see patient today at approximately 5:15-5:30 pm..  CSW talked with patient earlier today [facility] and he was aware and in agreement.  08/17/13: Insurance denied SNF placement. Sisters notified and patient discharged back to ALF.

## 2013-08-14 NOTE — Progress Notes (Signed)
Subjective: Mr. Capek was awoken from sleep for our visit this morning. He did not answer any questions.    The patient is on contact isolation for MRSA and a sitter was with him in his room. The sitter said that he had been awake and speaking to her earlier in the morning.  Objective: Vital signs in last 24 hours: Filed Vitals:   08/13/13 1811 08/13/13 2029 08/14/13 0542 08/14/13 1000  BP: 114/69 170/76 144/78 147/78  Pulse: 75 72 68 66  Temp: 98.3 F (36.8 C) 98.3 F (36.8 C) 98.6 F (37 C) 98.4 F (36.9 C)  TempSrc: Oral Oral Oral Oral  Resp: 16 16 16 18   Height:      Weight:      SpO2: 95% 94% 94% 98%   Weight change:   Intake/Output Summary (Last 24 hours) at 08/14/13 1117 Last data filed at 08/14/13 1059  Gross per 24 hour  Intake    240 ml  Output    925 ml  Net   -685 ml   Vitals reviewed. General: resting in bed, asleep with glasses on, sitter beside him HEENT: EOMI Cardiac: did not let me auscultate Pulm: did not let me examined Abd: unable to examine Ext: extremities soft, non-tender to palpation, mild edema around ankles, no erythema, symmetrical. Supposed to be NWB Neuro/Psych: alert and oriented X3, paranoid schizophrenic, often refuses blood draws, IV placement and XR, but cooperative when directed  Lab Results: Basic Metabolic Panel:  Recent Labs Lab 08/12/13 0456 08/13/13 0340  NA 136* 132*  K 4.2 4.0  CL 97 92*  CO2 24 24  GLUCOSE 126* 173*  BUN 34* 34*  CREATININE 1.98* 2.14*  CALCIUM 9.0 8.4   Liver Function Tests:  Recent Labs Lab 08/11/13 1218  AST 8  ALT 8  ALKPHOS 87  BILITOT 0.3  PROT 6.2  ALBUMIN 2.8*    Recent Labs Lab 08/11/13 1218  AMMONIA 28   CBC:  Recent Labs Lab 08/11/13 1218  WBC 9.7  NEUTROABS 6.1  HGB 10.8*  HCT 33.1*  MCV 87.1  PLT 406*   CBG:  Recent Labs Lab 08/12/13 1228 08/12/13 1619 08/12/13 2132 08/13/13 0750 08/13/13 1702 08/14/13 0750  GLUCAP 166* 208* 140* 140* 131* 110*     Urine Drug Screen: Drugs of Abuse     Component Value Date/Time   LABOPIA POSITIVE* 08/11/2013 1150   COCAINSCRNUR NONE DETECTED 08/11/2013 1150   LABBENZ NONE DETECTED 08/11/2013 1150   AMPHETMU NONE DETECTED 08/11/2013 1150   THCU NONE DETECTED 08/11/2013 1150   LABBARB NONE DETECTED 08/11/2013 1150    Urinalysis:  Recent Labs Lab 08/11/13 1150  COLORURINE YELLOW  LABSPEC 1.022  PHURINE 5.5  GLUCOSEU 100*  HGBUR NEGATIVE  BILIRUBINUR NEGATIVE  KETONESUR NEGATIVE  PROTEINUR >300*  UROBILINOGEN 0.2  NITRITE NEGATIVE  LEUKOCYTESUR NEGATIVE   Micro Results: Recent Results (from the past 240 hour(s))  MRSA PCR SCREENING     Status: Abnormal   Collection Time    08/11/13  8:22 PM      Result Value Ref Range Status   MRSA by PCR POSITIVE (*) NEGATIVE Final   Comment:            The GeneXpert MRSA Assay (FDA     approved for NASAL specimens     only), is one component of a     comprehensive MRSA colonization     surveillance program. It is not     intended to  diagnose MRSA     infection nor to guide or     monitor treatment for     MRSA infections.     RESULT CALLED TO, READ BACK BY AND VERIFIED WITHAngelina Sheriff:     C BOKIAGON RN [id number] 08/11/13 A BROWNING   Studies/Results: No results found. Medications: I have reviewed the patient's current medications. Scheduled Meds: . amLODipine  10 mg Oral Daily  . aspirin EC  81 mg Oral Daily  . atenolol  100 mg Oral Daily  . bisacodyl  10 mg Rectal Once  . Chlorhexidine Gluconate Cloth  6 each Topical Q0600  . clonazePAM  0.25 mg Oral BID  . haloperidol  15 mg Oral BID  . heparin  5,000 Units Subcutaneous 3 times per day  . hydrALAZINE  50 mg Oral TID  . insulin aspart  0-9 Units Subcutaneous TID WC  . lisinopril  10 mg Oral Daily  . morphine  15 mg Oral Q12H  . mupirocin ointment  1 application Nasal BID  . OXcarbazepine  450 mg Oral BID  . pantoprazole  40 mg Oral Daily  . tamsulosin  0.4 mg Oral QPC supper  . trihexyphenidyl   2.5 mg Oral Q breakfast   And  . trihexyphenidyl  5 mg Oral QHS  . vitamin B-12  1,000 mcg Oral Daily   Continuous Infusions: . sodium chloride Stopped (08/12/13 0005)   PRN Meds:.acetaminophen, HYDROcodone-acetaminophen, ondansetron, polyethylene glycol Assessment/Plan: Active Problems:   Hypertension   Schizophrenia, paranoid type   Type II or unspecified type diabetes mellitus without mention of complication, uncontrolled   Normocytic anemia   Periprosthetic hip fracture--nondisplaced, Left   Hyponatremia   Acute renal failure superimposed on stage 3 chronic kidney disease  [name] is a paranoid schizophrenic with a recent nondisplaced periprosthetic proximal femoral fracture returned to hospital from ALF and readmitted for acute on chronic renal failure. Difficult situation as he does not wish to the in the hospital and occasionally gets very upset when asked too many questions. Refused hip XR and IV placement on this admission. Allowed blood draw yesterday. Long discussion with family yesterday (sister); family has decided that SNF placement is appropriate at this time.  Acute on chronic CKD 3-- Cr improved from 2.38 on admission to 1.98 yesterday with hydration. Then, without hydration yesterday, Cr rose to 2.14. No further labs, but likely pre-renal in setting of decreased po intake. Unfortunately, IV lost on day of admission and he refuses for it to be replaced. Will encourage fluid po intake as tolerated. Of note, on ACEi, but improving while continued. - Continue fluid hydration po (as he is refusing IV placement) - Trend renal function (if he allows blood draws)   L Nondisplaced periprosthetic proximal femur fracture--last seen by Dr. Ave Filterhandler last week during prior admission. Concern that prior implant may be loose and may need revision surgery. However, given mental illness and behavior issues, he was deemed high risk for complications and recommended for NWB for at least 6  weeks with continued PT to see if it can heal and stabilize.  However, may need [facility]. Has appointment with Dr. Ave Filterhandler in 2.5 weeks. - Appreciate ortho consult; no changes to Dr. Veda Canninghandler's plan - Continue to work with PT, pain control, remain touchdown or NWB status for total 5 more weeks  - Pain control with MS contin and norco prn - Fall precautions - Ambulate with assistance - Sitter in room -  PT  Paranoid schizophrenia--at ALF, has Manufacturing engineer. Agitated at times but alert and oriented to person place and time. Does not like being in the hospital. Refuses IV and lab draws.  - Continue home medications including: klonopin, haldol, trileptal,  and artane - Haldol prn agitation  HTN--elevated on this admission in setting of agitation; high of 170/76 in last 24 hours - Continue home ACEi (renal function improving with hydration, likely not source of ATN), hydralazine, and norvasc - Control agitation--continue home medications including haldol  Lacunar infarction--subcentimeter, anterior limb of RIC new since March 2014. Likely small vessel disease in setting. RF is HTN - Attempt to control BP (as patient allows)  Diet: Carb modified  DVT Ppx: Heparin  Dispo: Awaiting SNF placement. Appreciate social work assistance. He will need continued NWB PT at SNF for 5 weeks; he has a scheduled orthopaedics appointment with Dr. [name].   The patient does not have a current PCP (Provider Default, MD) and does not need an [facility] hospital follow-up appointment after discharge. Will have follow up at SNF.  The patient does have transportation limitations that hinder transportation to clinic appointments.  Services Needed at time of discharge: Y = Yes, Blank = No PT:   OT:   RN:   Equipment:   Other:     LOS: 3 days   Dionne Ano, MD 08/14/2013, 11:17 AM

## 2013-08-14 NOTE — Progress Notes (Signed)
Internal Medicine Attending  Date: 08/14/2013  Patient name: David CarmineFuad Lang Medical record number: [id number] Date of birth: 05/30/1950 Age: 63 y.o. Gender: male  I saw and evaluated the patient. I developed the assessment and plan with the housestaff and reviewed the resident's note by Dr. Leatha GildingMallory.  I agree with the resident's findings and plans as documented in her progress note.  [name] has flipped his day-night cycle unfortunately. This can further worsen his paranoid schizophrenia. He has responded well to having a sitter in the room. At this point we are working on skilled nursing facility placement to continue his physical therapy. He is ready for discharge once this has been arranged.

## 2013-08-14 NOTE — Discharge Instructions (Signed)
Please continue non-weight-bearing physical therapy for a total of 6 weeks (5 more weeks). Dr. Ave Filterhandler will reassess your hip during your appointment August 5th at 10:45AM.

## 2013-08-14 NOTE — Progress Notes (Signed)
Utilization review completed.  

## 2013-08-15 DIAGNOSIS — N179 Acute kidney failure, unspecified: Secondary | ICD-10-CM | POA: Diagnosis not present

## 2013-08-15 DIAGNOSIS — I129 Hypertensive chronic kidney disease with stage 1 through stage 4 chronic kidney disease, or unspecified chronic kidney disease: Secondary | ICD-10-CM | POA: Diagnosis not present

## 2013-08-15 DIAGNOSIS — F200 Paranoid schizophrenia: Secondary | ICD-10-CM | POA: Diagnosis not present

## 2013-08-15 DIAGNOSIS — I6350 Cerebral infarction due to unspecified occlusion or stenosis of unspecified cerebral artery: Secondary | ICD-10-CM | POA: Diagnosis not present

## 2013-08-15 LAB — GLUCOSE, CAPILLARY
GLUCOSE-CAPILLARY: 172 mg/dL — AB (ref 70–99)
GLUCOSE-CAPILLARY: 147 mg/dL — AB (ref 70–99)
Glucose-Capillary: 129 mg/dL — ABNORMAL HIGH (ref 70–99)

## 2013-08-15 MED ORDER — ENSURE COMPLETE PO LIQD
237.0000 mL | Freq: Three times a day (TID) | ORAL | Status: DC
  Administered 2013-08-15 – 2013-08-16 (×4): 237 mL via ORAL

## 2013-08-15 NOTE — Progress Notes (Signed)
PT Cancellation Note  Patient Details Name: David Lang MRN: [id number] DOB: 09/25/1950   Cancelled Treatment:    Reason Eval/Treat Not Completed: Other (comment) At the time PT session was attempted, per RN, pt quite agitated, and they had recently helped the pt back to bed (after he was unsafely and impulsively getting up from the chair);   Will follow up later today as time allows;  Otherwise, will follow up for PT tomorrow;   Thank you,  Van ClinesHolly Waynetta Metheny, PT  Acute Rehabilitation Services Pager [phone] Office [phone]     Van ClinesGarrigan, Deryck Hippler [facility] 08/15/2013, 2:07 PM

## 2013-08-15 NOTE — Progress Notes (Signed)
Subjective: Mr. Coonradt was awake, eating his breakfast. He was very pleasant this morning and was talkative for the first time in several days.   The patient did not require a sitter overnight or this morning.   Objective: Vital signs in last 24 hours: Filed Vitals:   08/14/13 1800 08/14/13 2138 08/15/13 0542 08/15/13 1015  BP: 130/73 160/74 184/93 137/73  Pulse: 76 82 79 82  Temp: 98.2 F (36.8 C) 98.4 F (36.9 C) 97.3 F (36.3 C) 97.8 F (36.6 C)  TempSrc: Oral Oral Oral Oral  Resp: 18 18 18 18   Height:      Weight:      SpO2: 96% 91% 94% 95%   Weight change:   Intake/Output Summary (Last 24 hours) at 08/15/13 1112 Last data filed at 08/15/13 0455  Gross per 24 hour  Intake    960 ml  Output    675 ml  Net    285 ml   Vitals reviewed. General: sitting without his glasses, drinking his milk, in NAD HEENT: EOMI Cardiac: did not let me auscultate Pulm: did not let me examined Abd: unable to examine Ext: extremities soft, non-tender to palpation, mild edema around ankles, no erythema, symmetrical. Supposed to be NWB Neuro/Psych: alert and oriented X3, paranoid schizophrenic, often refuses blood draws, IV placement and XR, but cooperative when directed  Lab Results: Basic Metabolic Panel:  Recent Labs Lab 08/12/13 0456 08/13/13 0340  NA 136* 132*  K 4.2 4.0  CL 97 92*  CO2 24 24  GLUCOSE 126* 173*  BUN 34* 34*  CREATININE 1.98* 2.14*  CALCIUM 9.0 8.4   Liver Function Tests:  Recent Labs Lab 08/11/13 1218  AST 8  ALT 8  ALKPHOS 87  BILITOT 0.3  PROT 6.2  ALBUMIN 2.8*    Recent Labs Lab 08/11/13 1218  AMMONIA 28   CBC:  Recent Labs Lab 08/11/13 1218  WBC 9.7  NEUTROABS 6.1  HGB 10.8*  HCT 33.1*  MCV 87.1  PLT 406*   CBG:  Recent Labs Lab 08/13/13 1702 08/14/13 0750 08/14/13 1146 08/14/13 1703 08/14/13 2135 08/15/13 0819  GLUCAP 131* 110* 114* 226* 190* 129*   Urine Drug Screen: Drugs of Abuse     Component Value  Date/Time   LABOPIA POSITIVE* 08/11/2013 1150   COCAINSCRNUR NONE DETECTED 08/11/2013 1150   LABBENZ NONE DETECTED 08/11/2013 1150   AMPHETMU NONE DETECTED 08/11/2013 1150   THCU NONE DETECTED 08/11/2013 1150   LABBARB NONE DETECTED 08/11/2013 1150    Urinalysis:  Recent Labs Lab 08/11/13 1150  COLORURINE YELLOW  LABSPEC 1.022  PHURINE 5.5  GLUCOSEU 100*  HGBUR NEGATIVE  BILIRUBINUR NEGATIVE  KETONESUR NEGATIVE  PROTEINUR >300*  UROBILINOGEN 0.2  NITRITE NEGATIVE  LEUKOCYTESUR NEGATIVE   Micro Results: Recent Results (from the past 240 hour(s))  MRSA PCR SCREENING     Status: Abnormal   Collection Time    08/11/13  8:22 PM      Result Value Ref Range Status   MRSA by PCR POSITIVE (*) NEGATIVE Final   Comment:            The GeneXpert MRSA Assay (FDA     approved for NASAL specimens     only), is one component of a     comprehensive MRSA colonization     surveillance program. It is not     intended to diagnose MRSA     infection nor to guide or     monitor treatment  for     MRSA infections.     RESULT CALLED TO, READ BACK BY AND VERIFIED WITHAngelina Sheriff RN [id number] 08/11/13 A BROWNING   Studies/Results: No results found. Medications: I have reviewed the patient's current medications. Scheduled Meds: . amLODipine  10 mg Oral Daily  . aspirin EC  81 mg Oral Daily  . atenolol  100 mg Oral Daily  . bisacodyl  10 mg Rectal Once  . Chlorhexidine Gluconate Cloth  6 each Topical Q0600  . clonazePAM  0.25 mg Oral BID  . haloperidol  15 mg Oral BID  . heparin  5,000 Units Subcutaneous 3 times per day  . hydrALAZINE  50 mg Oral TID  . insulin aspart  0-9 Units Subcutaneous TID WC  . lisinopril  10 mg Oral Daily  . morphine  15 mg Oral Q12H  . mupirocin ointment  1 application Nasal BID  . OXcarbazepine  450 mg Oral BID  . pantoprazole  40 mg Oral Daily  . tamsulosin  0.4 mg Oral QPC supper  . trihexyphenidyl  2.5 mg Oral Q breakfast   And  . trihexyphenidyl  5 mg Oral  QHS  . vitamin B-12  1,000 mcg Oral Daily   Continuous Infusions: . sodium chloride Stopped (08/12/13 0005)   PRN Meds:.acetaminophen, HYDROcodone-acetaminophen, ondansetron, polyethylene glycol Assessment/Plan: Active Problems:   Hypertension   Schizophrenia, paranoid type   Type II or unspecified type diabetes mellitus without mention of complication, uncontrolled   Normocytic anemia   Periprosthetic hip fracture--nondisplaced, Left   Hyponatremia   Acute renal failure superimposed on stage 3 chronic kidney disease  Mr. Ndiaye is a paranoid schizophrenic with a recent nondisplaced periprosthetic proximal femoral fracture returned to hospital from ALF and readmitted for acute on chronic renal failure. Difficult situation as he does not wish to the in the hospital and occasionally gets very upset when asked too many questions. Refused hip XR and IV placement on this admission. Allowed blood draw once. Long discussion with family earlier in admission (sister); family has decided that SNF placement is appropriate at this time. Awaiting placement at family's preferred SNF, [facility].  Acute on chronic CKD 3-- Cr improved from 2.38 on admission to 1.98 yesterday with hydration. Then, without hydration on first day of admission, Cr then rose to 2.14 after IV was refused by patient. Patient has not allowed further labs, but this was likely pre-renal in setting of decreased po intake. Encouraging fluid intake PO. Of note, on ACEi. - Continue fluid hydration po (as he is refusing IV placement) - No longer trending renal function (as he is refusing labs)   L Nondisplaced periprosthetic proximal femur fracture--last seen by Dr. [name] last week during prior admission. Concern that prior implant may be loose and may need revision surgery. However, given mental illness and behavior issues, he was deemed high risk for complications and recommended for NWB for at least 6 weeks with continued PT  to see if it can heal and stabilize.  However, may need [facility]. Has appointment with Dr. [name] in 2.5 weeks. - Appreciate ortho consult; no changes to Dr. Veda Canning plan - Continue to work with PT, pain control, remain touchdown or NWB status for total 5 more weeks  - Pain control with MS contin and norco prn - Fall precautions - Ambulate with assistance  Paranoid schizophrenia--at ALF, has Manufacturing engineer. Agitated at times but alert and oriented  to person place and time. Does not like being in the hospital. Refuses IV and lab draws.  - Continue home medications including: klonopin, haldol, trileptal,  and artane - Haldol prn agitation  HTN--elevated on this admission in setting of agitation; high of 170/76 in last 24 hours - Continue home ACEi (renal function improving with hydration, likely not source of ATN), hydralazine, and norvasc - Control agitation--continue home medications including haldol  Lacunar infarction--subcentimeter, anterior limb of RIC new since March 2014. Likely small vessel disease in setting. RF is HTN - Controlling BP  Diet: Carb modified  DVT Ppx: Heparin  Dispo: Awaiting SNF placement. Appreciate social work assistance. He will need continued NWB PT at SNF for 5 weeks; he has a scheduled orthopaedics appointment with Dr. Ave Filterhandler.   The patient does not have a current PCP (Provider Default, MD) and [facility] not [facility] after discharge. Will have follow up at SNF.  The patient does have transportation limitations that hinder transportation to clinic appointments.  Services Needed at time of discharge: Y = Yes, Blank = No PT:   OT:   RN:   Equipment:   Other:     LOS: 4 days   Dionne AnoJulia Juliet Vasbinder, MD 08/15/2013, 11:12 AM

## 2013-08-15 NOTE — Progress Notes (Signed)
Internal Medicine Attending  Date: 08/15/2013  Patient name: David Lang Medical record number: [id number] Date of birth: 09/16/1950 Age: 63 y.o. Gender: male  I saw and evaluated the patient. I developed the assessment and plan with the housestaff and reviewed the resident's note by Dr. Leatha GildingMallory.  I agree with the resident's findings and plans as documented in her progress note.  David Lang was more interactive and cooperative today when seen on rounds.  We continue to wait for the SNF placement process to run its course.  We are continuing current supportive care.

## 2013-08-15 NOTE — Progress Notes (Signed)
INITIAL NUTRITION ASSESSMENT  DOCUMENTATION CODES Per approved criteria  -Not Applicable   INTERVENTION: - Ensure Complete TID - RD to monitor plan of care   NUTRITION DIAGNOSIS: Inadequate oral intake related to poor appetite, asleep, on/off agititation as evidenced by several meals documented as 0% meal intake.    Goal: Pt to consume >90% of meals/supplements  Monitor:  Weights, labs, intake  Reason for Assessment: Malnutrition screening tool   63 y.o. male  Admitting Dx: Swollen left leg  ASSESSMENT: Pt with PMHx of HTN, DM, and paranoid schizophrenia who [facility] w/ a swollen left leg. Noted pt with periods of agitation during admission requiring sitter which improved and pt without sitter today. Pt found to have left nondisplaced periprosthetic proximal femur fracture.   - Pt alone in room - Attempted to talk with pt however pt barely opened eyes and just made loud noises - [facility] [employer] - Per conversation with sitter yesterday, pt had a caregiver bring him a gyro which he ate 50% of - Per RN documentation, pt with mostly 0% meal intake  - Did not appear malnourished  Height: Ht Readings from Last 1 Encounters:  08/11/13 6' (1.829 m)    Weight: Wt Readings from Last 1 Encounters:  08/12/13 196 lb 10.4 oz (89.2 kg)    Ideal Body Weight: 178 lbs   % Ideal Body Weight: 110%  Wt Readings from Last 10 Encounters:  08/12/13 196 lb 10.4 oz (89.2 kg)  03/31/12 178 lb (80.74 kg)  03/31/12 178 lb (80.74 kg)  03/03/12 180 lb 3 oz (81.733 kg)    Usual Body Weight: 178 lbs last year  % Usual Body Weight: 110%  BMI:  Body mass index is 26.66 kg/(m^2).  Estimated Nutritional Needs: Kcal: 2100-2300 Protein: 95-115g Fluid: per MD  Skin: +2 RLE, LLE edema  Diet Order: Carb Control  EDUCATION NEEDS: -No education needs identified at this time   Intake/Output Summary  (Last 24 hours) at 08/15/13 1447 Last data filed at 08/15/13 1300  Gross per 24 hour  Intake    840 ml  Output    600 ml  Net    240 ml    Last BM: 7/20  Labs:   Recent Labs Lab 08/11/13 1218 08/12/13 0456 08/13/13 0340  NA 134* 136* 132*  K 4.5 4.2 4.0  CL 95* 97 92*  CO2 24 24 24   BUN 41* 34* 34*  CREATININE 2.38* 1.98* 2.14*  CALCIUM 8.7 9.0 8.4  GLUCOSE 192* 126* 173*    CBG (last 3)   Recent Labs  08/14/13 2135 08/15/13 0819 08/15/13 1201  GLUCAP 190* 129* 172*    Scheduled Meds: . amLODipine  10 mg Oral Daily  . aspirin EC  81 mg Oral Daily  . atenolol  100 mg Oral Daily  . bisacodyl  10 mg Rectal Once  . Chlorhexidine Gluconate Cloth  6 each Topical Q0600  . clonazePAM  0.25 mg Oral BID  . haloperidol  15 mg Oral BID  . heparin  5,000 Units Subcutaneous 3 times per day  . hydrALAZINE  50 mg Oral TID  . insulin aspart  0-9 Units Subcutaneous TID WC  . lisinopril  10 mg Oral Daily  . morphine  15 mg Oral Q12H  . mupirocin ointment  1 application Nasal BID  . OXcarbazepine  450 mg Oral BID  . pantoprazole  40 mg Oral Daily  .  tamsulosin  0.4 mg Oral QPC supper  . trihexyphenidyl  2.5 mg Oral Q breakfast   And  . trihexyphenidyl  5 mg Oral QHS  . vitamin B-12  1,000 mcg Oral Daily    Continuous Infusions:   Past Medical History  Diagnosis Date  . Hypertension   . Diabetes mellitus without complication   . Mental disorder   . Schizophrenia     Past Surgical History  Procedure Laterality Date  . Hip arthroplasty Left 03/31/2012    Procedure: ARTHROPLASTY BIPOLAR HIP;  Surgeon: Mable Paris, MD;  Location: [facility];  Service: Orthopedics;  Laterality: Left;    Charlott Rakes MS, RD, LDN [phone] Pager [phone] Weekend/After Hours Pager

## 2013-08-16 DIAGNOSIS — N179 Acute kidney failure, unspecified: Secondary | ICD-10-CM | POA: Diagnosis not present

## 2013-08-16 DIAGNOSIS — I129 Hypertensive chronic kidney disease with stage 1 through stage 4 chronic kidney disease, or unspecified chronic kidney disease: Secondary | ICD-10-CM | POA: Diagnosis not present

## 2013-08-16 DIAGNOSIS — F200 Paranoid schizophrenia: Secondary | ICD-10-CM | POA: Diagnosis not present

## 2013-08-16 DIAGNOSIS — I6350 Cerebral infarction due to unspecified occlusion or stenosis of unspecified cerebral artery: Secondary | ICD-10-CM | POA: Diagnosis not present

## 2013-08-16 LAB — GLUCOSE, CAPILLARY
Glucose-Capillary: 119 mg/dL — ABNORMAL HIGH (ref 70–99)
Glucose-Capillary: 206 mg/dL — ABNORMAL HIGH (ref 70–99)
Glucose-Capillary: 160 mg/dL — ABNORMAL HIGH (ref 70–99)
Glucose-Capillary: 203 mg/dL — ABNORMAL HIGH (ref 70–99)

## 2013-08-16 NOTE — Progress Notes (Signed)
Physical Therapy Treatment Patient Details Name: David Lang MRN: [id number] DOB: 11/30/1950 Today's Date: 08/16/2013    History of Present Illness 63 y.o. male with nondisplaced periprosthetic proximal femoral fracture on left. Hx of hemiarthroplasty in march of 2014. Additionally, has significant history of schizophrenia, HTN, and diabetes. was admitted recently, sent back to ALF with extra assist, and now cargivers sent him back to Hospital due to concern re swelling     PT Comments    Noted some improvement with bed mobility and participation today  Follow Up Recommendations  SNF;Supervision/Assistance - 24 hour     Equipment Recommendations  Wheelchair (measurements PT);Wheelchair cushion (measurements PT);Rolling walker with 5" wheels;3in1 (PT)    Recommendations for Other Services       Precautions / Restrictions Precautions Precautions: Fall Restrictions LLE Weight Bearing: Non weight bearing    Mobility  Bed Mobility Overal bed mobility: Needs Assistance;+2 for physical assistance Bed Mobility: Supine to Sit     Supine to sit: +2 for physical assistance;Mod assist     General bed mobility comments: Requires mas assist of 2 for LLE support, trunk elevation and to spuare off hips at EOB; Shows good sitting balance once upright on EOB  Transfers Overall transfer level: Needs assistance Equipment used: 2 person hand held assist Transfers: Stand Pivot Transfers   Stand pivot transfers: +2 physical assistance;Max assist       General transfer comment: Basic pivot transfer towards chair on pt's Right; L knee blocked and second person to provide support at trunk and monitor to keep weight off of LLE; Still very difficult keeping weight off of LLE  Ambulation/Gait             General Gait Details: For bed to chair/wheelchair transfers only   Stairs            Wheelchair Mobility    Modified Rankin (Stroke Patients Only)       Balance      Sitting balance-Leahy Scale: Fair       Standing balance-Leahy Scale: Poor                      Cognition Arousal/Alertness: Awake/alert Behavior During Therapy: Flat affect Overall Cognitive Status: History of cognitive impairments - at baseline       Memory: Decreased recall of precautions;Decreased short-term memory              Exercises General Exercises - Lower Extremity Short Arc Quad: AROM;Right;Left;5 reps Heel Slides: Left;AAROM;10 reps Hip ABduction/ADduction: AAROM;Left;10 reps    General Comments        Pertinent Vitals/Pain Did not rate L hip pain, but clearly hurting during transfer; patient repositioned for comfort     Home Living                      Prior Function            PT Goals (current goals can now be found in the care plan section) Acute Rehab PT Goals Patient Stated Goal: Did not state PT Goal Formulation: Patient unable to participate in goal setting Time For Goal Achievement: 08/27/13 Potential to Achieve Goals: Fair Progress towards PT goals: Progressing toward goals (Slowly)    Frequency  Min 3X/week    PT Plan Current plan remains appropriate    Co-evaluation             End of Session Equipment Utilized During Treatment: Gait belt Activity Tolerance: Patient  limited by lethargy Patient left: in chair;with call bell/phone within reach     Time: 0853-0908 PT Time Calculation (min): 15 min  Charges:  $Therapeutic Activity: 8-22 mins                    G Codes:      Van Clines [facility] 08/16/2013, 11:13 AM  Van Clines, PT  Acute Rehabilitation Services Pager [phone] Office [phone]

## 2013-08-16 NOTE — Progress Notes (Signed)
I saw and evaluated the patient. I developed the assessment and plan with the housestaff and reviewed the resident's note by Dr. Leatha GildingMallory.  I agree with the resident's findings and plans as documented in progress note.  Remains more cooperative with interview this AM.  Awaiting SNF placement.

## 2013-08-16 NOTE — Progress Notes (Signed)
Subjective: David Lang was awake, sitting up in bed. He was very pleasant this morning, still talkative (though primarily speaking in Arabic).   Objective: Vital signs in last 24 hours: Filed Vitals:   08/15/13 1844 08/15/13 2230 08/16/13 0601 08/16/13 0958  BP: 140/70 164/80 185/85 132/67  Pulse: 77 66 72 69  Temp: 97.2 F (36.2 C) 97.7 F (36.5 C) 97.5 F (36.4 C) 97.9 F (36.6 C)  TempSrc: Oral Oral Oral Oral  Resp: 18 18 19 18   Height:      Weight:      SpO2: 96% 98% 94% 100%   Weight change:   Intake/Output Summary (Last 24 hours) at 08/16/13 1133 Last data filed at 08/15/13 2231  Gross per 24 hour  Intake    120 ml  Output    200 ml  Net    -80 ml   Vitals reviewed. General: sitting up in bed near his breakfast, wearing glasses HEENT: EOMI Cardiac: did not allow auscultation Pulm: did not allow examination Abd: did not allow examination Ext: extremities soft, non-tender to palpation, mild edema around ankles, no erythema, symmetrical. Supposed to be NWB Neuro/Psych: alert and oriented X3, paranoid schizophrenic, often refuses blood draws, IV placement and XR, but cooperative when directed.  Lab Results: Basic Metabolic Panel:  Recent Labs Lab 08/12/13 0456 08/13/13 0340  NA 136* 132*  K 4.2 4.0  CL 97 92*  CO2 24 24  GLUCOSE 126* 173*  BUN 34* 34*  CREATININE 1.98* 2.14*  CALCIUM 9.0 8.4   Liver Function Tests:  Recent Labs Lab 08/11/13 1218  AST 8  ALT 8  ALKPHOS 87  BILITOT 0.3  PROT 6.2  ALBUMIN 2.8*    Recent Labs Lab 08/11/13 1218  AMMONIA 28   CBC:  Recent Labs Lab 08/11/13 1218  WBC 9.7  NEUTROABS 6.1  HGB 10.8*  HCT 33.1*  MCV 87.1  PLT 406*   CBG:  Recent Labs Lab 08/14/13 1703 08/14/13 2135 08/15/13 0819 08/15/13 1201 08/15/13 2227 08/16/13 0756  GLUCAP 226* 190* 129* 172* 147* 119*   Urine Drug Screen: Drugs of Abuse     Component Value Date/Time   LABOPIA POSITIVE* 08/11/2013 1150   COCAINSCRNUR  NONE DETECTED 08/11/2013 1150   LABBENZ NONE DETECTED 08/11/2013 1150   AMPHETMU NONE DETECTED 08/11/2013 1150   THCU NONE DETECTED 08/11/2013 1150   LABBARB NONE DETECTED 08/11/2013 1150    Urinalysis:  Recent Labs Lab 08/11/13 1150  COLORURINE YELLOW  LABSPEC 1.022  PHURINE 5.5  GLUCOSEU 100*  HGBUR NEGATIVE  BILIRUBINUR NEGATIVE  KETONESUR NEGATIVE  PROTEINUR >300*  UROBILINOGEN 0.2  NITRITE NEGATIVE  LEUKOCYTESUR NEGATIVE   Micro Results: Recent Results (from the past 240 hour(s))  MRSA PCR SCREENING     Status: Abnormal   Collection Time    08/11/13  8:22 PM      Result Value Ref Range Status   MRSA by PCR POSITIVE (*) NEGATIVE Final   Comment:            The GeneXpert MRSA Assay (FDA     approved for NASAL specimens     only), is one component of a     comprehensive MRSA colonization     surveillance program. It is not     intended to diagnose MRSA     infection nor to guide or     monitor treatment for     MRSA infections.     RESULT CALLED TO, READ BACK  BY AND VERIFIED WITHAngelina Sheriff:     C BOKIAGON RN [phone] 08/11/13 A BROWNING   Studies/Results: No results found. Medications: I have reviewed the patient's current medications. Scheduled Meds: . amLODipine  10 mg Oral Daily  . aspirin EC  81 mg Oral Daily  . atenolol  100 mg Oral Daily  . bisacodyl  10 mg Rectal Once  . Chlorhexidine Gluconate Cloth  6 each Topical Q0600  . clonazePAM  0.25 mg Oral BID  . feeding supplement (ENSURE COMPLETE)  237 mL Oral TID BM  . haloperidol  15 mg Oral BID  . heparin  5,000 Units Subcutaneous 3 times per day  . hydrALAZINE  50 mg Oral TID  . insulin aspart  0-9 Units Subcutaneous TID WC  . lisinopril  10 mg Oral Daily  . morphine  15 mg Oral Q12H  . mupirocin ointment  1 application Nasal BID  . OXcarbazepine  450 mg Oral BID  . pantoprazole  40 mg Oral Daily  . tamsulosin  0.4 mg Oral QPC supper  . trihexyphenidyl  2.5 mg Oral Q breakfast   And  . trihexyphenidyl  5 mg Oral  QHS  . vitamin B-12  1,000 mcg Oral Daily   Continuous Infusions:   PRN Meds:.acetaminophen, HYDROcodone-acetaminophen, ondansetron, polyethylene glycol Assessment/Plan: Active Problems:   Hypertension   Schizophrenia, paranoid type   Type II or unspecified type diabetes mellitus without mention of complication, uncontrolled   Normocytic anemia   Periprosthetic hip fracture--nondisplaced, Left   Hyponatremia   Acute renal failure superimposed on stage 3 chronic kidney disease  [name] is a paranoid schizophrenic with a recent nondisplaced periprosthetic proximal femoral fracture returned to hospital from ALF and readmitted for acute on chronic renal failure. Difficult situation as he does not wish to the in the hospital and has repeatedly refused hip XR, IV placement and some blood draws on this admission. There have been long discussions with the family on this admission (primarily his sister); family has decided that SNF placement is appropriate at this time. Awaiting [facility] at [facility]'s preferred SNF, [facility].  Acute on chronic CKD 3-- Cr improved from 2.38 on admission to 1.98 with hydration. Then, after IV was refused by the patient and IVF had to be stopped, Cr rose to 2.14. Patient has not allowed further labs, but this was likely pre-renal in the setting of decreased po intake. Encouraging fluid intake PO. Of note, on ACEi. - Continue fluid hydration po (as he is refusing IV placement), Ensure Complete TID added by Registered Dietician - No longer trending renal function (as patient is refusing labs)   L Nondisplaced periprosthetic proximal femur fracture--last seen by Dr. Ave Filterhandler last week during prior admission. Concern that prior implant may be loose and may need revision surgery. However, given mental illness and behavior issues, he was deemed high risk for complications and recommended for NWB for at least 6 weeks with continued PT to see if it can heal and  stabilize.  However, may need [facility]. Has appointment with Dr. Ave Filterhandler in 2.5 weeks. - Appreciate ortho consult; no changes to Dr. Veda Canninghandler's plan - Continue to work with PT, pain control, remain touchdown or NWB status for total 5 more weeks  - Pain control with MS contin and norco prn - Fall precautions - Ambulate with assistance  Paranoid schizophrenia--at ALF, has Manufacturing engineerpersonal assistant. Agitated at times but alert and oriented to person place and time. Does  not like being in the hospital. Refuses IV and lab draws.  - Continue home medications including: klonopin, haldol, trileptal,  and artane - Haldol prn agitation  HTN--elevated on this admission in setting of agitation; high of 170/76 in last 24 hours - Continue home ACEi (renal function improving with hydration, likely not source of ATN), hydralazine, and norvasc - Control agitation--continue home medications including haldol  Lacunar infarction--subcentimeter, anterior limb of RIC new since March 2014. Likely small vessel disease in setting. RF is HTN - Controlling BP  Diet: Carb modified  DVT Ppx: Heparin  Dispo: Awaiting SNF placement. Appreciate social work assistance. He will need continued NWB PT at SNF for 5 weeks; he has a scheduled orthopaedics appointment with Dr. [name]. PT recommends wheelchair with cushion.  The patient does not have a current PCP (Provider Default, MD) and does not need an [facility] hospital follow-up appointment after discharge. Will have follow up at SNF.  The patient does have transportation limitations that hinder transportation to clinic appointments.  Services Needed at time of discharge: Y = Yes, Blank = No PT:   OT:   RN:   Equipment:   Other:     LOS: 5 days   Dionne Ano, MD 08/16/2013, 11:33 AM

## 2013-08-17 DIAGNOSIS — I6350 Cerebral infarction due to unspecified occlusion or stenosis of unspecified cerebral artery: Secondary | ICD-10-CM | POA: Diagnosis not present

## 2013-08-17 DIAGNOSIS — N179 Acute kidney failure, unspecified: Secondary | ICD-10-CM | POA: Diagnosis not present

## 2013-08-17 DIAGNOSIS — F200 Paranoid schizophrenia: Secondary | ICD-10-CM | POA: Diagnosis not present

## 2013-08-17 DIAGNOSIS — I129 Hypertensive chronic kidney disease with stage 1 through stage 4 chronic kidney disease, or unspecified chronic kidney disease: Secondary | ICD-10-CM | POA: Diagnosis not present

## 2013-08-17 LAB — GLUCOSE, CAPILLARY
GLUCOSE-CAPILLARY: 239 mg/dL — AB (ref 70–99)
GLUCOSE-CAPILLARY: 175 mg/dL — AB (ref 70–99)

## 2013-08-17 MED ORDER — MORPHINE SULFATE ER 15 MG PO TBCR: 15.0000 mg | EXTENDED_RELEASE_TABLET | Freq: Two times a day (BID) | ORAL | Status: DC

## 2013-08-17 MED ORDER — HYDROCODONE-ACETAMINOPHEN 5-325 MG PO TABS: 1.0000 | ORAL_TABLET | Freq: Three times a day (TID) | ORAL | Status: DC | PRN

## 2013-08-17 MED ORDER — ENSURE COMPLETE PO LIQD: 237.0000 mL | Freq: Three times a day (TID) | ORAL | Status: DC

## 2013-08-17 NOTE — Progress Notes (Signed)
Internal Medicine Attending  Date: 08/17/2013  Patient name: David CarmineFuad Lang Medical record number: [id number] Date of birth: 03/03/1950 Age: 63 y.o. Gender: male  I saw and evaluated the patient. I developed the assessment and plan with the housestaff and reviewed the resident's note by Dr. Leatha GildingMallory.  I agree with the resident's findings and plans as documented in her progress note.  David Lang's insurance company has rejected transfer to SNF.  He will therefore be transferred back to his ALF for continuing care and rehab.

## 2013-08-17 NOTE — Progress Notes (Signed)
Subjective: David Lang was asleep by the nursing station this morning. According to the nursing staff, he had been awake and talkative, but had been attempting to get out of bed many times this morning; hence, his placement at the nursing station. He had since fallen asleep. He did not wish to be awoken.  Objective: Vital signs in last 24 hours: Filed Vitals:   08/16/13 0601 08/16/13 0958 08/16/13 1901 08/17/13 0558  BP: 185/85 132/67 153/90 160/85  Pulse: 72 69 78 72  Temp: 97.5 F (36.4 C) 97.9 F (36.6 C) 98.4 F (36.9 C) 98.5 F (36.9 C)  TempSrc: Oral Oral Oral Oral  Resp: 19 18 18 16   Height:      Weight:      SpO2: 94% 100% 100% 97%   Weight change:   Intake/Output Summary (Last 24 hours) at 08/17/13 0842 Last data filed at 08/16/13 1903  Gross per 24 hour  Intake    300 ml  Output    200 ml  Net    100 ml   Vitals reviewed. General: sitting up in chair, asleep with glasses on HEENT: EOMI Cardiac: did not allow auscultation Pulm: did not allow examination Abd: did not allow examination Ext: extremities soft, non-tender to palpation, mild edema around ankles, no erythema, symmetrical. Supposed to be NWB Neuro/Psych: alert and oriented X3, paranoid schizophrenic, often refuses blood draws, IV placement and XR, but cooperative when directed.  Lab Results: Basic Metabolic Panel:  Recent Labs Lab 08/12/13 0456 08/13/13 0340  NA 136* 132*  K 4.2 4.0  CL 97 92*  CO2 24 24  GLUCOSE 126* 173*  BUN 34* 34*  CREATININE 1.98* 2.14*  CALCIUM 9.0 8.4   Liver Function Tests:  Recent Labs Lab 08/11/13 1218  AST 8  ALT 8  ALKPHOS 87  BILITOT 0.3  PROT 6.2  ALBUMIN 2.8*    Recent Labs Lab 08/11/13 1218  AMMONIA 28   CBC:  Recent Labs Lab 08/11/13 1218  WBC 9.7  NEUTROABS 6.1  HGB 10.8*  HCT 33.1*  MCV 87.1  PLT 406*   CBG:  Recent Labs Lab 08/15/13 2227 08/16/13 0756 08/16/13 1137 08/16/13 1640 08/16/13 2114 08/17/13 0739  GLUCAP  147* 119* 206* 160* 203* 239*   Urine Drug Screen: Drugs of Abuse     Component Value Date/Time   LABOPIA POSITIVE* 08/11/2013 1150   COCAINSCRNUR NONE DETECTED 08/11/2013 1150   LABBENZ NONE DETECTED 08/11/2013 1150   AMPHETMU NONE DETECTED 08/11/2013 1150   THCU NONE DETECTED 08/11/2013 1150   LABBARB NONE DETECTED 08/11/2013 1150    Urinalysis:  Recent Labs Lab 08/11/13 1150  COLORURINE YELLOW  LABSPEC 1.022  PHURINE 5.5  GLUCOSEU 100*  HGBUR NEGATIVE  BILIRUBINUR NEGATIVE  KETONESUR NEGATIVE  PROTEINUR >300*  UROBILINOGEN 0.2  NITRITE NEGATIVE  LEUKOCYTESUR NEGATIVE   Micro Results: Recent Results (from the past 240 hour(s))  MRSA PCR SCREENING     Status: Abnormal   Collection Time    08/11/13  8:22 PM      Result Value Ref Range Status   MRSA by PCR POSITIVE (*) NEGATIVE Final   Comment:            The GeneXpert MRSA Assay (FDA     approved for NASAL specimens     only), is one component of a     comprehensive MRSA colonization     surveillance program. It is not     intended to diagnose MRSA  infection nor to guide or     monitor treatment for     MRSA infections.     RESULT CALLED TO, READ BACK BY AND VERIFIED WITHAngelina Sheriff RN 1610 08/11/13 A BROWNING   Studies/Results: No results found. Medications: I have reviewed the patient's current medications. Scheduled Meds: . amLODipine  10 mg Oral Daily  . aspirin EC  81 mg Oral Daily  . atenolol  100 mg Oral Daily  . bisacodyl  10 mg Rectal Once  . clonazePAM  0.25 mg Oral BID  . feeding supplement (ENSURE COMPLETE)  237 mL Oral TID BM  . haloperidol  15 mg Oral BID  . heparin  5,000 Units Subcutaneous 3 times per day  . hydrALAZINE  50 mg Oral TID  . insulin aspart  0-9 Units Subcutaneous TID WC  . lisinopril  10 mg Oral Daily  . morphine  15 mg Oral Q12H  . OXcarbazepine  450 mg Oral BID  . pantoprazole  40 mg Oral Daily  . tamsulosin  0.4 mg Oral QPC supper  . trihexyphenidyl  2.5 mg Oral Q  breakfast   And  . trihexyphenidyl  5 mg Oral QHS  . vitamin B-12  1,000 mcg Oral Daily   Continuous Infusions:   PRN Meds:.acetaminophen, HYDROcodone-acetaminophen, ondansetron, polyethylene glycol Assessment/Plan: Active Problems:   Hypertension   Schizophrenia, paranoid type   Type II or unspecified type diabetes mellitus without mention of complication, uncontrolled   Normocytic anemia   Periprosthetic hip fracture--nondisplaced, Left   Hyponatremia   Acute renal failure superimposed on stage 3 chronic kidney disease  Mr. Poch is a paranoid schizophrenic with a recent nondisplaced periprosthetic proximal femoral fracture returned to hospital from ALF and readmitted for acute on chronic renal failure. Difficult situation as he does not wish to the in the hospital and has repeatedly refused hip XR, IV placement and some blood draws on this admission. There have been long discussions with the family on this admission (primarily his sister); family has decided that SNF placement at [facility] is appropriate at this time. However, insurance has denied SNF placement at this time. Home ALF  [facility]) vs other alternatives pending.    Acute on chronic CKD 3-- Cr improved from 2.38 on admission to 1.98 with hydration. Then, after IV was refused by the patient and IVF had to be stopped, Cr rose to 2.14. Patient has not allowed further labs, but this was likely pre-renal in the setting of decreased po intake. Encouraging fluid intake PO. Of note, on ACEi. - Continue fluid hydration po (as he is refusing IV placement), Ensure Complete TID added by Registered Dietician - No longer trending renal function (as patient is refusing labs)   L Nondisplaced periprosthetic proximal femur fracture--last seen by Dr. [name] last week during prior admission. Concern that prior implant may be loose and may need revision surgery. However, given mental illness and behavior issues, he was  deemed high risk for complications and recommended for NWB for at least 6 weeks with continued PT to see if it can heal and stabilize.  However, may need [facility]. Has appointment with Dr. [name] in 2.5 weeks. - Appreciate ortho consult; no changes to Dr. Veda Canning plan - Continue to work with PT, pain control, remain touchdown or NWB status for total 5 more weeks  - Pain control with MS contin and norco prn - Fall precautions -  Ambulate with assistance  Paranoid schizophrenia--at ALF, has Manufacturing engineerpersonal assistant. Agitated at times but alert and oriented to person place and time. Does not like being in the hospital. Refuses IV and lab draws.  - Continue home medications including: klonopin, haldol, trileptal,  and artane - Haldol prn agitation  HTN--elevated on this admission in setting of agitation; high of 170/76 in last 24 hours - Continue home ACEi (renal function improving with hydration, likely not source of ATN), hydralazine, and norvasc - Control agitation--continue home medications including haldol  Lacunar infarction--subcentimeter, anterior limb of RIC new since March 2014. Likely small vessel disease in setting. RF is HTN - Controlling BP  Diet: Carb modified  DVT Ppx: Heparin  Dispo: Awaiting SNF placement. Appreciate social work assistance. He will need continued NWB PT at SNF for 5 weeks; he has a scheduled orthopaedics appointment with Dr. Ave Filterhandler. PT recommends wheelchair with cushion.  The patient does not have a current PCP (Provider Default, MD) and [facility] after [facility].  The patient does have transportation limitations that hinder transportation to clinic appointments.  Services Needed at time of discharge: Y = Yes, Blank = No PT:   OT:   RN:   Equipment:   Other:     LOS: 6 days   Dionne AnoJulia Hiliana Eilts, MD 08/17/2013, 8:42  AM

## 2013-08-17 NOTE — Progress Notes (Signed)
[facility]; nurse did not answer x 2 so writer left message. Pt leaving unit via EMS.

## 2013-08-19 ENCOUNTER — Inpatient Hospital Stay (HOSPITAL_COMMUNITY)
Admission: EM | Admit: 2013-08-19 | Discharge: 2013-08-25 | DRG: 070 | Disposition: A | Discharge status: Skilled Nursing Facility | Payer: BC Managed Care – PPO | Attending: Internal Medicine | Admitting: Internal Medicine

## 2013-08-19 ENCOUNTER — Encounter (HOSPITAL_COMMUNITY): Payer: Self-pay | Admitting: Emergency Medicine

## 2013-08-19 ENCOUNTER — Emergency Department (HOSPITAL_COMMUNITY): Payer: BC Managed Care – PPO

## 2013-08-19 ENCOUNTER — Inpatient Hospital Stay (HOSPITAL_COMMUNITY): Payer: BC Managed Care – PPO

## 2013-08-19 DIAGNOSIS — M978XXA Periprosthetic fracture around other internal prosthetic joint, initial encounter: Secondary | ICD-10-CM

## 2013-08-19 DIAGNOSIS — T84049A Periprosthetic fracture around unspecified internal prosthetic joint, initial encounter: Secondary | ICD-10-CM

## 2013-08-19 DIAGNOSIS — T07XXXA Unspecified multiple injuries, initial encounter: Secondary | ICD-10-CM

## 2013-08-19 DIAGNOSIS — T796XXA Traumatic ischemia of muscle, initial encounter: Secondary | ICD-10-CM

## 2013-08-19 DIAGNOSIS — Z4789 Encounter for other orthopedic aftercare: Secondary | ICD-10-CM

## 2013-08-19 DIAGNOSIS — R7989 Other specified abnormal findings of blood chemistry: Secondary | ICD-10-CM | POA: Diagnosis not present

## 2013-08-19 DIAGNOSIS — Z9660 Presence of unspecified orthopedic joint implant: Secondary | ICD-10-CM

## 2013-08-19 DIAGNOSIS — M978XXD Periprosthetic fracture around other internal prosthetic joint, subsequent encounter: Secondary | ICD-10-CM

## 2013-08-19 DIAGNOSIS — G9340 Encephalopathy, unspecified: Secondary | ICD-10-CM | POA: Diagnosis not present

## 2013-08-19 DIAGNOSIS — E1122 Type 2 diabetes mellitus with diabetic chronic kidney disease: Secondary | ICD-10-CM | POA: Diagnosis present

## 2013-08-19 DIAGNOSIS — D649 Anemia, unspecified: Secondary | ICD-10-CM

## 2013-08-19 DIAGNOSIS — Y831 Surgical operation with implant of artificial internal device as the cause of abnormal reaction of the patient, or of later complication, without mention of misadventure at the time of the procedure: Secondary | ICD-10-CM

## 2013-08-19 DIAGNOSIS — R296 Repeated falls: Secondary | ICD-10-CM

## 2013-08-19 DIAGNOSIS — N183 Chronic kidney disease, stage 3 unspecified: Secondary | ICD-10-CM | POA: Diagnosis present

## 2013-08-19 DIAGNOSIS — IMO0002 Reserved for concepts with insufficient information to code with codable children: Secondary | ICD-10-CM | POA: Diagnosis present

## 2013-08-19 DIAGNOSIS — F200 Paranoid schizophrenia: Secondary | ICD-10-CM

## 2013-08-19 DIAGNOSIS — N179 Acute kidney failure, unspecified: Secondary | ICD-10-CM | POA: Diagnosis not present

## 2013-08-19 DIAGNOSIS — J690 Pneumonitis due to inhalation of food and vomit: Secondary | ICD-10-CM

## 2013-08-19 DIAGNOSIS — M6282 Rhabdomyolysis: Secondary | ICD-10-CM | POA: Diagnosis not present

## 2013-08-19 DIAGNOSIS — W19XXXA Unspecified fall, initial encounter: Secondary | ICD-10-CM | POA: Diagnosis present

## 2013-08-19 DIAGNOSIS — I10 Essential (primary) hypertension: Secondary | ICD-10-CM | POA: Diagnosis present

## 2013-08-19 DIAGNOSIS — IMO0001 Reserved for inherently not codable concepts without codable children: Secondary | ICD-10-CM

## 2013-08-19 DIAGNOSIS — Z781 Physical restraint status: Secondary | ICD-10-CM | POA: Diagnosis present

## 2013-08-19 DIAGNOSIS — D72829 Elevated white blood cell count, unspecified: Secondary | ICD-10-CM | POA: Diagnosis not present

## 2013-08-19 DIAGNOSIS — Y9210 Unspecified residential institution as the place of occurrence of the external cause: Secondary | ICD-10-CM | POA: Diagnosis present

## 2013-08-19 DIAGNOSIS — I129 Hypertensive chronic kidney disease with stage 1 through stage 4 chronic kidney disease, or unspecified chronic kidney disease: Secondary | ICD-10-CM | POA: Diagnosis present

## 2013-08-19 DIAGNOSIS — G9349 Other encephalopathy: Principal | ICD-10-CM | POA: Diagnosis present

## 2013-08-19 DIAGNOSIS — E872 Acidosis, unspecified: Secondary | ICD-10-CM

## 2013-08-19 DIAGNOSIS — Z7982 Long term (current) use of aspirin: Secondary | ICD-10-CM

## 2013-08-19 DIAGNOSIS — Z9181 History of falling: Secondary | ICD-10-CM

## 2013-08-19 DIAGNOSIS — D631 Anemia in chronic kidney disease: Secondary | ICD-10-CM

## 2013-08-19 DIAGNOSIS — E1165 Type 2 diabetes mellitus with hyperglycemia: Secondary | ICD-10-CM

## 2013-08-19 DIAGNOSIS — N039 Chronic nephritic syndrome with unspecified morphologic changes: Secondary | ICD-10-CM

## 2013-08-19 DIAGNOSIS — E871 Hypo-osmolality and hyponatremia: Secondary | ICD-10-CM

## 2013-08-19 DIAGNOSIS — R945 Abnormal results of liver function studies: Secondary | ICD-10-CM

## 2013-08-19 DIAGNOSIS — Z96649 Presence of unspecified artificial hip joint: Secondary | ICD-10-CM

## 2013-08-19 DIAGNOSIS — N390 Urinary tract infection, site not specified: Secondary | ICD-10-CM

## 2013-08-19 LAB — COMPREHENSIVE METABOLIC PANEL
ALBUMIN: 3 g/dL — AB (ref 3.5–5.2)
ALT: 26 U/L (ref 0–53)
AST: 59 U/L — ABNORMAL HIGH (ref 0–37)
Alkaline Phosphatase: 119 U/L — ABNORMAL HIGH (ref 39–117)
Anion gap: 19 — ABNORMAL HIGH (ref 5–15)
BILIRUBIN TOTAL: 0.4 mg/dL (ref 0.3–1.2)
BUN: 53 mg/dL — AB (ref 6–23)
CO2: 24 mEq/L (ref 19–32)
CREATININE: 3.85 mg/dL — AB (ref 0.50–1.35)
Calcium: 9.3 mg/dL (ref 8.4–10.5)
Chloride: 93 mEq/L — ABNORMAL LOW (ref 96–112)
GFR calc non Af Amer: 15 mL/min — ABNORMAL LOW (ref >90)
GFR, EST AFRICAN AMERICAN: 18 mL/min — AB (ref >90)
GLUCOSE: 169 mg/dL — AB (ref 70–99)
Potassium: 5.3 mEq/L (ref 3.7–5.3)
Sodium: 136 mEq/L — ABNORMAL LOW (ref 137–147)
TOTAL PROTEIN: 6.9 g/dL (ref 6.0–8.3)

## 2013-08-19 LAB — TROPONIN I

## 2013-08-19 LAB — GLUCOSE, CAPILLARY
GLUCOSE-CAPILLARY: 166 mg/dL — AB (ref 70–99)
GLUCOSE-CAPILLARY: 135 mg/dL — AB (ref 70–99)

## 2013-08-19 LAB — CBC WITH DIFFERENTIAL/PLATELET
BASOS ABS: 0 10*3/uL (ref 0.0–0.1)
Basophils Relative: 0 % (ref 0–1)
EOS ABS: 0 10*3/uL (ref 0.0–0.7)
Eosinophils Relative: 0 % (ref 0–5)
HEMATOCRIT: 34.4 % — AB (ref 39.0–52.0)
Hemoglobin: 11.3 g/dL — ABNORMAL LOW (ref 13.0–17.0)
LYMPHS PCT: 10 % — AB (ref 12–46)
Lymphs Abs: 1.8 10*3/uL (ref 0.7–4.0)
MCH: 29.4 pg (ref 26.0–34.0)
MCHC: 32.8 g/dL (ref 30.0–36.0)
MCV: 89.4 fL (ref 78.0–100.0)
MONO ABS: 1.3 10*3/uL — AB (ref 0.1–1.0)
Monocytes Relative: 8 % (ref 3–12)
Neutro Abs: 14.3 10*3/uL — ABNORMAL HIGH (ref 1.7–7.7)
Neutrophils Relative %: 82 % — ABNORMAL HIGH (ref 43–77)
Platelets: 475 10*3/uL — ABNORMAL HIGH (ref 150–400)
RBC: 3.85 MIL/uL — ABNORMAL LOW (ref 4.22–5.81)
RDW: 12.6 % (ref 11.5–15.5)
WBC: 17.5 10*3/uL — ABNORMAL HIGH (ref 4.0–10.5)

## 2013-08-19 LAB — URINALYSIS, ROUTINE W REFLEX MICROSCOPIC
Glucose, UA: 250 mg/dL — AB
Ketones, ur: NEGATIVE mg/dL
Leukocytes, UA: NEGATIVE
NITRITE: NEGATIVE
Protein, ur: >300 mg/dL — AB
SPECIFIC GRAVITY, URINE: 1.02 (ref 1.005–1.030)
UROBILINOGEN UA: 0.2 mg/dL (ref 0.0–1.0)
pH: 5.5 (ref 5.0–8.0)

## 2013-08-19 LAB — SODIUM, URINE, RANDOM: SODIUM UR: 22 meq/L

## 2013-08-19 LAB — URINE MICROSCOPIC-ADD ON

## 2013-08-19 LAB — CREATININE, URINE, RANDOM: Creatinine, Urine: 121.58 mg/dL

## 2013-08-19 LAB — PHOSPHORUS: Phosphorus: 4.7 mg/dL — ABNORMAL HIGH (ref 2.3–4.6)

## 2013-08-19 LAB — CK: CK TOTAL: 4024 U/L — AB (ref 7–232)

## 2013-08-19 MED ORDER — BIOTENE DRY MOUTH MT LIQD: 15.0000 mL | Freq: Two times a day (BID) | OROMUCOSAL | Status: DC

## 2013-08-19 MED ORDER — SODIUM CHLORIDE 0.9 % IV SOLN
INTRAVENOUS | Status: DC
  Administered 2013-08-19 – 2013-08-20 (×4): via INTRAVENOUS

## 2013-08-19 MED ORDER — SODIUM CHLORIDE 0.9 % IV SOLN: Freq: Once | INTRAVENOUS | Status: DC

## 2013-08-19 MED ORDER — SODIUM CHLORIDE 0.9 % IV SOLN
3.0000 g | Freq: Two times a day (BID) | INTRAVENOUS | Status: DC
  Administered 2013-08-19 – 2013-08-21 (×4): 3 g via INTRAVENOUS
  Filled 2013-08-19 (×5): qty 3

## 2013-08-19 MED ORDER — SODIUM CHLORIDE 0.9 % IV SOLN
1.5000 g | Freq: Two times a day (BID) | INTRAVENOUS | Status: DC
  Filled 2013-08-19: qty 1.5

## 2013-08-19 MED ORDER — SODIUM CHLORIDE 0.9 % IV BOLUS (SEPSIS)
1000.0000 mL | Freq: Once | INTRAVENOUS | Status: AC
Start: 2013-08-19 — End: 2013-08-19
  Administered 2013-08-19: 1000 mL via INTRAVENOUS

## 2013-08-19 MED ORDER — CHLORHEXIDINE GLUCONATE 0.12 % MT SOLN
15.0000 mL | Freq: Two times a day (BID) | OROMUCOSAL | Status: DC
  Administered 2013-08-19 – 2013-08-24 (×5): 15 mL via OROMUCOSAL
  Filled 2013-08-19 (×14): qty 15

## 2013-08-19 MED ORDER — CEFEPIME HCL 1 G IJ SOLR
1.0000 g | Freq: Two times a day (BID) | INTRAMUSCULAR | Status: DC
  Administered 2013-08-19: 1 g via INTRAVENOUS
  Filled 2013-08-19 (×2): qty 1

## 2013-08-19 MED ORDER — HEPARIN SODIUM (PORCINE) 5000 UNIT/ML IJ SOLN
5000.0000 [IU] | Freq: Three times a day (TID) | INTRAMUSCULAR | Status: DC
  Administered 2013-08-19 – 2013-08-21 (×2): 5000 [IU] via SUBCUTANEOUS
  Filled 2013-08-19 (×6): qty 1

## 2013-08-19 MED ORDER — SODIUM CHLORIDE 0.9 % IV SOLN
Freq: Once | INTRAVENOUS | Status: AC
  Administered 2013-08-19: 06:00:00 via INTRAVENOUS

## 2013-08-19 MED ORDER — SODIUM CHLORIDE 0.9 % IV BOLUS (SEPSIS)
1000.0000 mL | Freq: Once | INTRAVENOUS | Status: AC
  Administered 2013-08-19: 1000 mL via INTRAVENOUS

## 2013-08-19 NOTE — Progress Notes (Signed)
Pharmacy Consult - Unasyn  For UTI CrCl = 24 ml / min  Plan: Unasyn 3 grams iv Q 12 hours Pharmacy will sign off -- re-consult if needed  Thank you. Okey RegalLisa Zamora Colton, PharmD [phone]

## 2013-08-19 NOTE — Evaluation (Signed)
Clinical/Bedside Swallow Evaluation Patient Details  Name: David Lang MRN: [id number] Date of Birth: 07/31/1950  Today's Date: 08/19/2013 Time: [id number] SLP Time Calculation (min): 12 min  Past Medical History:  Past Medical History  Diagnosis Date  . Hypertension   . Diabetes mellitus without complication   . Mental disorder   . Schizophrenia    Past Surgical History:  Past Surgical History  Procedure Laterality Date  . Hip arthroplasty Left 03/31/2012    Procedure: ARTHROPLASTY BIPOLAR HIP;  Surgeon: Mable ParisJustin William Chandler, MD;  Location: [facility];  Service: Orthopedics;  Laterality: Left;   HPI:  Pt is a 63 y/o M w/ PMHx of HTN, DM, and paranoid schizophrenia who [facility] w/ acute encephalopathy, when staff reportedly found him with AMS. Pt was last admitted on 7/17 for a swollen left leg 2/2 to fall and d/c'd 7/23.   Assessment / Plan / Recommendation Clinical Impression  Current mentation limits pt's awareness and participation in PO trials at this time. Pt required Max multimodal stimulation for arousal and Max cues to follow one-step commands with <50% accuracy. He exhibits decreased oral acceptance of PO, although two ice chips accepted resulted in oral holding and resultant wet vocal quality noted after multiple swallows. Recommend to remain NPO at this time. SLP to follow for PO readiness as mentation improves.    Aspiration Risk  Severe    Diet Recommendation NPO   Medication Administration: Via alternative means    Other  Recommendations Oral Care Recommendations: Oral care Q4 per protocol   Follow Up Recommendations  Other (comment) (TBD)    Frequency and Duration min 3x week  2 weeks   Pertinent Vitals/Pain n/a    SLP Swallow Goals     Swallow Study Prior Functional Status       General Date of Onset: 08/19/13 HPI: Pt is a 63 y/o M w/ PMHx of HTN, DM, and paranoid schizophrenia who [facility] w/ acute encephalopathy, when staff reportedly found him with AMS. Pt was last admitted on 7/17 for a swollen left leg 2/2 to fall and d/c'd 7/23. Type of Study: Bedside swallow evaluation Previous Swallow Assessment: none in chart Diet Prior to this Study: NPO Temperature Spikes Noted: No Respiratory Status: Room air History of Recent Intubation: No Behavior/Cognition: Lethargic;Requires cueing Oral Cavity - Dentition: Adequate natural dentition Self-Feeding Abilities: Total assist Patient Positioning: Upright in bed Baseline Vocal Quality: Wet Volitional Cough: Cognitively unable to elicit Volitional Swallow: Unable to elicit    Oral/Motor/Sensory Function Overall Oral Motor/Sensory Function:  (cognitively unable to participate in assessment)   Ice Chips Ice chips: Impaired Presentation: Spoon Oral Phase Impairments: Poor awareness of bolus Oral Phase Functional Implications: Oral holding Pharyngeal Phase Impairments: Suspected delayed Swallow;Wet Vocal Quality   Thin Liquid Thin Liquid: Not tested    Nectar Thick Nectar Thick Liquid: Not tested   Honey Thick Honey Thick Liquid: Not tested   Puree Puree: Impaired Presentation: Spoon Oral Phase Impairments: Poor awareness of bolus;Other (comment) (would not orally accept puree)   Solid   GO    Solid: Not tested        Maxcine HamLaura Paiewonsky, M.A. CCC-SLP [phone]  [name], [name] 08/19/2013,11:15 AM

## 2013-08-19 NOTE — H&P (Signed)
Date: 08/19/2013               Patient Name:  David Lang MRN: [id number]  DOB: [birth year] Age / Sex: 63 y.o., male   PCP: Provider Default, MD         Medical Service: Internal Medicine Teaching Service         Attending Physician: Dr. Karren Cobble, MD    First Contact: Dr. Sherrine Maples Pager: [phone]  Second Contact: Dr. Eula Fried Pager: [phone]       After Hours (After 5p/  First Contact Pager: [phone]  weekends / holidays): Second Contact Pager: [phone]   Chief Complaint: acute encephalopathy   History of Present Illness: Pt is a 63 y/o M w/ PMHx of HTN, DM, and paranoid schizophrenia who presents from his assisted living facility at [facility]  w/ acute encephalopathy. Pt was last admitted on 7/17 for a swollen left leg 2/2 to fall and d/c'd 7/23. Pt unable to be obtained from pt as he would not wake up. Per ED notes pt arrived to ED from [place] home after staff found him altered and they are unsure when he was last seen normal. EMS reports that pt is only moaning and groaning but able to follow commands. CBG 166. Pt given 1 dose of cefepime 1g by the ED.   On our examination this morning: he was sleeping but arousable although not talking and trying to pull off his mittens.  He did have pain on lifting or right and left legs and noted abrasions on b/l knees that are new.  Upon taking with his ALF this morning, they report an unwitnessed fall yesterday when trying to get out of bed to use the bathroom--incident report was filed unclear if he was found on the ground.  And then he also was noted to have a fall around 1am this morning, found on his knees and head on the bed. Unclear if any LOC. He was subsequently sent to the ER.   Meds: Current Facility-Administered Medications  Medication Dose Route Frequency Provider Last Rate Last Dose  . 0.9 %  sodium chloride infusion   Intravenous Once Jerene Pitch, MD      . ceFEPIme (MAXIPIME) 1 g in dextrose 5 % 50  mL IVPB  1 g Intravenous Q12H Varney Biles, MD 100 mL/hr at 08/19/13 0619 1 g at 08/19/13 7106   Allergies: Allergies as of 08/19/2013  . (No Known Allergies)   Past Medical History  Diagnosis Date  . Hypertension   . Diabetes mellitus without complication   . Mental disorder   . Schizophrenia    Past Surgical History  Procedure Laterality Date  . Hip arthroplasty Left 03/31/2012    Procedure: ARTHROPLASTY BIPOLAR HIP;  Surgeon: Nita Sells, MD;  Location: [facility];  Service: Orthopedics;  Laterality: Left;   History reviewed. No pertinent family history. History   Social History  . Marital Status: Single    Spouse Name: N/A    Number of Children: N/A  . Years of Education: N/A   Occupational History  . Not on file.   Social History Main Topics  . Smoking status: Never Smoker   . Smokeless tobacco: Never Used  . Alcohol Use: No     Comment: not answering  . Drug Use: No     Comment: not answering questions  . Sexual Activity: No     Comment: not cooperative with questions   Other Topics Concern  .  Not on file   Social History Narrative  . No narrative on file   Review of Systems: Unable to obtain due to patient's drowsiness.  Physical Exam: Blood pressure 129/50, pulse 72, temperature 98.5 F (36.9 C), temperature source Rectal, resp. rate 15, SpO2 96.00%. General appearance:  Lethargic, grumbling, was initially sleeping but woke to voice and eyes open HEENT: pupils equal and reactive to light.  Heart: regular rate and rhythm, S1, S2 normal, no murmur, click, rub or gallop Abdomen: soft, non-tender; bowel sounds normal Extremities:abrasions on knees b/l, no obvious erythema or edema and no tenderness to palpation, trace pitting edema of b/l lower extremities, swollen b/l feet but symmetrical, pain on lifting of right ankle and palpation of left hip Neuro: opened eyes to command but not responding to questions, trying to pull off mits, does respond to  painful stimulus and moving eyes  Lab results: Basic Metabolic Panel:  Recent Labs  08/19/13 0405  NA 136*  K 5.3  CL 93*  CO2 24  GLUCOSE 169*  BUN 53*  CREATININE 3.85*  CALCIUM 9.3   Liver Function Tests:  Recent Labs  08/19/13 0405  AST 59*  ALT 26  ALKPHOS 119*  BILITOT 0.4  PROT 6.9  ALBUMIN 3.0*   CBC:  Recent Labs  08/19/13 0530  WBC 17.5*  NEUTROABS 14.3*  HGB 11.3*  HCT 34.4*  MCV 89.4  PLT 475*   Urinalysis:  Recent Labs  08/19/13 0402  COLORURINE YELLOW  LABSPEC 1.020  PHURINE 5.5  GLUCOSEU 250*  HGBUR TRACE*  BILIRUBINUR SMALL*  KETONESUR NEGATIVE  PROTEINUR >300*  UROBILINOGEN 0.2  NITRITE NEGATIVE  LEUKOCYTESUR NEGATIVE    Imaging results:  Dg Chest 2 View  08/19/2013   CLINICAL DATA:  Chest pain.  EXAM: CHEST  2 VIEW  COMPARISON:  Chest radiograph from 04/01/2012  FINDINGS: The lungs are well-aerated. There is elevation of the right hemidiaphragm. Minimal left basilar opacity likely reflects atelectasis. Mild peribronchial thickening is noted. No pleural effusion or pneumothorax is seen.  The heart is normal in size; the mediastinal contour is within normal limits. No acute osseous abnormalities are seen. There is mild chronic deformity of the proximal right humerus.  IMPRESSION: Elevation of the right hemidiaphragm. Minimal left basilar opacity likely reflects atelectasis. Mild peribronchial thickening noted.   Electronically Signed   By: Garald Balding M.D.   On: 08/19/2013 05:29   Ct Head Wo Contrast  08/19/2013   CLINICAL DATA:  Altered mental status.  EXAM: CT HEAD WITHOUT CONTRAST  TECHNIQUE: Contiguous axial images were obtained from the base of the skull through the vertex without intravenous contrast.  COMPARISON:  CT of the head from 08/11/2013  FINDINGS: There is no evidence of acute infarction, mass lesion, or intra- or extra-axial hemorrhage on CT.  Prominence of the ventricles and sulci reflects moderate cortical volume  loss. Scattered periventricular and subcortical white matter change likely reflects small vessel ischemic microangiopathy. A chronic lacunar infarct is noted in the right caudate. Cerebellar atrophy is noted.  The brainstem and fourth ventricle are within normal limits. The basal ganglia are unremarkable in appearance. The cerebral hemispheres demonstrate grossly normal gray-white differentiation. No mass effect or midline shift is seen.  There is no evidence of fracture; visualized osseous structures are unremarkable in appearance. The visualized portions of the orbits are within normal limits. The paranasal sinuses and mastoid air cells are well-aerated. No significant soft tissue abnormalities are seen.  IMPRESSION: 1. No acute intracranial  pathology seen on CT. 2. Moderate cortical volume loss and scattered small vessel ischemic microangiopathy. Chronic lacunar infarct at the right caudate.   Electronically Signed   By: Garald Balding M.D.   On: 08/19/2013 05:19   EKG: sinus rhythm, QTc 450, minimal ST depression in ii, V5 and V6 and t wave flattening in aVL which appear unchanged from prior EKGs  Assessment & Plan by Problem:  Acute encephalopathy w/ unclear etiology-- ?UTI, pt had many bacteria on u/a but neg for nitrites and LE and only 0-2 wbc. He does however have an impressive leukocytosis up to 17.5 with predominant relative neutrophils from 9.7 last week but is afebrile.  Polypharmacy is included in the differential with pain medications including MS contin and Norco for his hip fracture.  With his worsening renal failure, clearance of these sedating medications could be slow and resulting in his mental status change at this time. Concern for aspiration 2/2 to AMS does exist and based on CXR, however afebrile with no cough or respiratory distress.  Acute CVA also possible however CT head negative for any acute intracranial pathology and pt moving all 4 extremities and responding to pain and opening  eyes to command. ?Concussion from recent falls (one yesterday per ALF when getting out of bed and one this morning) but unclear if he hit his head or LOC (no obvious signs of trauma on head) and per ALF last seen normal prior to fall this morning.  Finally, uremia in the setting of worsening renal failure is also a consideration.  - admit to med surg - NPO for now until SLP evaluation and mental status improves; holding all sedating and PO medications at this time - Foley inserted in ER and given one dose of Cefepime in the ER. Will empirically treat for aspiration PNA with Unasyn (renal dosing will be needed) that should provide coverage for possible UTI as well.  - mitts and soft restraints to prevent pulling of IV access. - neuro checks - urine culture pending - CE x1 neg--mild ST depressions noted on lateral leads on EKG, however do not appear new when compared to prior tracings. No complaints of CP or SOB currently or on prior admissions.   Acute on chronic renal failure in setting of CKD3:  Cr 1.39 04/15/2012, on admission Cr 3.85 up from 2.14 on last admission. Likely Pre-renal 2/2 to hypovolemia with likely decreased po intake (BUN up to 53 and GFR down to 15 today) and worsened by Rhabdomyolysis given recent immobility, repeat falls outside of the hospital and likely muscle breakdown.  He has worsening renal function, trace hgb on u/a with 0-2 RBC, and increase in K to 5.3 from 4.0 on last admission. Of note, U/A is acidic with pH 5.5 and noted amorphous urates and phosphates with granular casts.   - Will favor aggressive hydration at this time, especially given IV is still in place and hopefully he does not pull it out. Will start with NS bolus and then transition to NS @ 259m/hr - added urine Na (22) and Cr (121.58) confirming FeNA of 0.51% to morning u/a along with CK ( 4024) and phos (4.7 elevated) to morning labs - AM CMET - Consider renal ultrasound if renal function continues to worsen  despite hydration.   Rhabdomyolysis with anion gap metabolic acidosis and elevated LFTs--CK up to 4024 this admission with AG up to 19 this admission (has been 15-17 on prior admission) with CO2 of 24.  Acidosis likely worsened  in the setting of his acute on chronic renal failure and rhabodmyolsis. Infection can certainly cause an acidosis, and he does have a leukocytosis however no clear source of infection at this time. Abdomen is soft with no pain illicited on physical exam with deep palpation ?UTI vs. Dehydration with decreased po intake and possible starvation ketoacidosis.  U/A did not show ketones. In regards to LFTs: AST 59 (from 8 on 7/17) and alk phos 119 (87 on 7/17), likely in setting of muscle breakdown with rhabdomyolysis.  AST/ALT >1.  Medications including his haldol could also cause elevation in LFTs. - Continue to hydrate with IVF and trend AG and LFTs - Resume PO diet as mental status improves and SLP evaluation - AM CMET  Falls in the setting of L Nondisplaced periprosthetic proximal femur fracture--today seen with new b/l knee abrasions. Last seen by Dr. Tamera Punt from ortho on prior admissions for concern that prior implant may be loose and may need revision surgery. However, given mental illness and behavior issues, he was deemed high risk for complications and recommended for NWB for at least 6 weeks with continued PT to see if it can heal and stabilize. However, if not healing, may need surgery, [facility]. Has appointment with Dr. Tamera Punt in ~2 weeks as an outpatient.  - Difficult situation given multiple falls and mental status. He would benefit from SNF and close supervision with continued PT if able to be covered by insurance.  - Could consider repeat xray's of hip if seen by ortho sooner - Will hold on imaging of the knees at this time given no significant edema or erythema but he does have obvious abrasions to b/l knees but able to  move b/l extremities - May consider imaging right ankle/foot given edema and pain on movement/lifiting - Resume PT once mental status improves  Paranoid schizophrenia: Chronic, on klonopin 0.29m BID, haldol 15 mg BID, tripletal 4534mBID, trihexyphenidyl 2.5 mg qAM and 97m28mHS at home - awaiting medication records from ALF to see last doses given - holding PO medications at this time given mental status, will resume once able to do so  Hypertension: home meds amlodipine 10 mg daily, atenolol 100 mg daily, hydralazine 50 TID, clonidine patch, and lisinopril 35m797mCurrently normotensive 125/68.  -holding home meds except clonidine patch (need to verify when last one added)   DM2--uncontrolled, last A1C 8.8 02/2012 -NPO, will place on SSI when tolerating diet - CBGs q4  Chronic normocytic anemia in the setting of CKD 3: baseline Hgb 10, currently 11.3. Will continue to monitor. Last iron panel: Iron 12 and Ferritin 198 and TIBC 194. -AM cbc  Diet: NPO for now DVT prophylaxis - heparin SQ TID  Dispo: Disposition is deferred at this time, awaiting improvement of current medical problems.   The patient does have a current PCP (Provider Default, MD) and will have hospital follow-up appointment after discharge at SNF.  The patient does not have transportation limitations that hinder transportation to clinic appointments.  Signed: DianJulious Oka 08/19/2013, 7:45 AM

## 2013-08-19 NOTE — ED Notes (Signed)
Pt arrived by [facility] from [facility]. Staff reported finding pt altered, unsure of last seen normal. Pts only response is moaning and groaning but able to follow commands with ems. his baseline is that he normally communicates with staff. cbg 166 pta.

## 2013-08-19 NOTE — Progress Notes (Signed)
Pt semi-combative during lab draw attempt. Pt would not let phlebotomist obtain blood sample. Pt also refused to all tech to obtain sample for capillary blood glucose. After attempt, Clinical research associatewriter reapplied [employer] mitts. Pt calm and no longer a threat to staff.

## 2013-08-19 NOTE — ED Notes (Signed)
Patient transported to CT 

## 2013-08-19 NOTE — H&P (Signed)
Internal Medicine Attending Admission Note Date: 08/19/2013  Patient name: David CarmineFuad Lang Medical record number: [id number] Date of birth: 10/20/1950 Age: 63 y.o. Gender: male  I saw and evaluated the patient. I reviewed the resident's note and I agree with the resident's findings and plan as documented in the resident's note.  Chief Complaint(s): Acute encephalopathy, falls  History - key components related to admission:  David Lang is a 63 year old man with a history of paranoid schizophrenia, diabetes, and hypertension who is well known to the internal medicine teaching service from 2 previous admissions this month related to a fall with a periprosthetic hip fracture and subsequently acute encephalopathy similar to the presentation today. He was recently transferred back to his assisted-living facility after his insurance company rejected our recommendation for a skilled nursing facility placement. Apparently, on the evening prior to admission he fell at his assisted living facility when he tried to get out of bed and go to the bathroom. He was again found at 1:00 AM on the morning of admission on his knees with his head on the bed. Further history surrounding this event is unavailable. He was brought to the emergency department by EMS who noted when they found him he was groaning, but did follow simple commands.  When seen on rounds this morning he did open his eyes to command and groan. He subsequently was somnolent and did not respond to any further questions although he did follow simple commands such as move his hands.  Physical Exam - key components related to admission:  Filed Vitals:   08/19/13 0415 08/19/13 0630 08/19/13 0924 08/19/13 0938  BP: 112/47 129/50 125/68   Pulse: 71 72 73   Temp:   98.9 F (37.2 C)   TempSrc:   Oral   Resp: 14 15 16    Height:    6' 0.05" (1.83 m)  Weight:    205 lb 11 oz (93.3 kg)  SpO2: 100% 96% 93%    General: Well-developed, well-nourished, man lying  comfortably in bed in no acute distress. Initially he opened his eyes and moved his hands to command. Otherwise, he was somnolent and did not answer questions. Lungs: Clear to auscultation bilaterally anteriorly. Heart: Regular rate and rhythm without murmurs, rubs, or gallops. Abdomen: Soft, nontender, active bowel sounds. Extremities: Bilateral lower extremity edema to the ankles. He groaned when we elevated his right leg but had no hip tenderness to palpation on the right. He did not groan when we elevated his left leg and did not have left sided hip tenderness to palpation. Skin: No bruises found anteriorly or on the head other than what would be expected on the abdomen from previous Lovenox injections. He had abrasions on both knees.  Lab results:  Basic Metabolic Panel:  Recent Labs  40/98/1107/25/15 0405  NA 136*  K 5.3  CL 93*  CO2 24  GLUCOSE 169*  BUN 53*  CREATININE 3.85*  CALCIUM 9.3  PHOS 4.7*   Liver Function Tests:  Recent Labs  08/19/13 0405  AST 59*  ALT 26  ALKPHOS 119*  BILITOT 0.4  PROT 6.9  ALBUMIN 3.0*   CBC:  Recent Labs  08/19/13 0530  WBC 17.5*  NEUTROABS 14.3*  HGB 11.3*  HCT 34.4*  MCV 89.4  PLT 475*   Cardiac Enzymes:  Recent Labs  08/19/13 0405  CKTOTAL 4024*  TROPONINI <0.30   CBG:  Recent Labs  08/16/13 1640 08/16/13 2114 08/17/13 0739 08/17/13 1206 08/19/13 0747 08/19/13 1148  GLUCAP  160* 203* 239* 175* 166* 135*   Urinalysis:  Cloudy, glucose 250, trace hemoglobin, small bilirubin, greater than 300 protein, negative nitrite, negative leukocytes, 0-2 red blood cells per high-power field, 0-2 white blood cells per high-power field, granular casts, amorphous urates, many bacteria.  Misc. Labs:  White blood cell count differential 82% neutrophils, 10% lymphocytes, 8% monocytes  Urine culture pending  Urine sodium 22  Imaging results:  Dg Chest 2 View  08/19/2013   CLINICAL DATA:  Chest pain.  EXAM: CHEST  2 VIEW   COMPARISON:  Chest radiograph from 04/01/2012  FINDINGS: The lungs are well-aerated. There is elevation of the right hemidiaphragm. Minimal left basilar opacity likely reflects atelectasis. Mild peribronchial thickening is noted. No pleural effusion or pneumothorax is seen.  The heart is normal in size; the mediastinal contour is within normal limits. No acute osseous abnormalities are seen. There is mild chronic deformity of the proximal right humerus.  IMPRESSION: Elevation of the right hemidiaphragm. Minimal left basilar opacity likely reflects atelectasis. Mild peribronchial thickening noted.   Electronically Signed   By: Roanna Raider M.D.   On: 08/19/2013 05:29   Ct Head Wo Contrast  08/19/2013   CLINICAL DATA:  Altered mental status.  EXAM: CT HEAD WITHOUT CONTRAST  TECHNIQUE: Contiguous axial images were obtained from the base of the skull through the vertex without intravenous contrast.  COMPARISON:  CT of the head from 08/11/2013  FINDINGS: There is no evidence of acute infarction, mass lesion, or intra- or extra-axial hemorrhage on CT.  Prominence of the ventricles and sulci reflects moderate cortical volume loss. Scattered periventricular and subcortical white matter change likely reflects small vessel ischemic microangiopathy. A chronic lacunar infarct is noted in the right caudate. Cerebellar atrophy is noted.  The brainstem and fourth ventricle are within normal limits. The basal ganglia are unremarkable in appearance. The cerebral hemispheres demonstrate grossly normal gray-white differentiation. No mass effect or midline shift is seen.  There is no evidence of fracture; visualized osseous structures are unremarkable in appearance. The visualized portions of the orbits are within normal limits. The paranasal sinuses and mastoid air cells are well-aerated. No significant soft tissue abnormalities are seen.  IMPRESSION: 1. No acute intracranial pathology seen on CT. 2. Moderate cortical volume loss  and scattered small vessel ischemic microangiopathy. Chronic lacunar infarct at the right caudate.   Electronically Signed   By: Roanna Raider M.D.   On: 08/19/2013 05:19   Dg Hip Portable 1 View Right  08/19/2013   CLINICAL DATA:  Pain  EXAM: PORTABLE RIGHT HIP - 1 VIEW  COMPARISON:  None.  FINDINGS: Frontal view obtained. There is a total hip prosthesis on the left which appears well seated. No acute fracture or dislocation on this single view. There is mild narrowing of the right hip joint.  IMPRESSION: Total hip prosthesis present on the left. Mild narrowing right hip joint. No fracture or dislocation seen on this single view.   Electronically Signed   By: Bretta Bang M.D.   On: 08/19/2013 14:30   Dg Foot Complete Right  08/19/2013   CLINICAL DATA:  Pain and swelling post trauma  EXAM: RIGHT FOOT COMPLETE - 3+ VIEW  COMPARISON:  None.  FINDINGS: Frontal and lateral views were obtained. There are flexion deformities at multiple distal joints. No acute fracture or dislocation. There is mild narrowing of all PIP and DIP joints. No erosive change. There is a minimal inferior calcaneal spur. There are foci of arterial vascular  calcification.  IMPRESSION: Flexion deformities of multiple distal joints. No acute fracture or dislocation. Mild narrowing of multiple distal joints. Extensive arterial vascular calcification consistent with diabetes mellitus.   Electronically Signed   By: Bretta Bang M.D.   On: 08/19/2013 14:29   Other results:  EKG: Normal sinus rhythm at 76 beats per minute, normal axis, normal intervals, no significant Q waves, no LVH by voltage, delayed R wave progression, 1 mm ST depression in leads V5 through V6. Unchanged from the previous ECG on 08/05/2013.  Assessment & Plan by Problem:  David Lang is a 62 year old man with a history of paranoid schizophrenia, diabetes, hypertension, and recent periprosthetic hip fracture who presents with a similar episode of an acute  encephalopathy as he had during the previous admission. This time he does have some changes on chest x-ray which are subtle but could represent a left-sided aspiration pneumonia and explain his leukocytosis. He also may have a urinary tract infection based upon a small leukocytosis within the urine although this is felt not to be as clinically significant. He has mild rhabdomyolysis probably related to his 2 recent falls. This has resulted in acute on chronic renal insufficiency. Finally, he continues to have day night shifting and some of his somnolence may be related to this as he had similar issues during the most recent admission. He really would benefit from skilled nursing facility physical therapy and we will try to convince his insurance company of this fact.  1) Acute encephalopathy: Unclear etiology although we are concerned it may be secondary to an acute left-sided aspiration pneumonia and day-night shifting. We are avoiding sedation in hopes of improving his encephalopathy and moving him back to an appropriate day-night schedule. We will also be treating his presumed aspiration pneumonia with IV Unasyn. Unfortunately he received antibiotics in the emergency department before blood cultures were drawn. Nonetheless, if he were to spike a fever again, we will and obtain blood cultures. We will monitor his mental status with the above therapy.  2) Rhabdomyolysis with acute on chronic renal insufficiency: While we have intravenous access we are aggressively hydrating him with normal saline in hopes of continuing diuresis and flushing of the myoglobin. As long as he will allow, we will monitor his renal function with periodic basic metabolic panels. We will continue the aggressive IV hydration as long as he will allow [place] to maintain IV access. We are hopeful we can decrease his CK to less than 1000 before he pulled out his IVs.  3) Disposition: Pending his response to treatment as outlined above. Once  his mental status improves we will reinitiate physical therapy and again tried to convince his insurance company of the fact that he requires skilled nursing facility for further rehabilitation. I suspect this will take several days. In the meantime, we continue current supportive therapy as well as chronic management of his other comorbidities as best we can while he is n.p.o.

## 2013-08-20 DIAGNOSIS — M6282 Rhabdomyolysis: Secondary | ICD-10-CM | POA: Diagnosis not present

## 2013-08-20 DIAGNOSIS — D72829 Elevated white blood cell count, unspecified: Secondary | ICD-10-CM

## 2013-08-20 DIAGNOSIS — E119 Type 2 diabetes mellitus without complications: Secondary | ICD-10-CM

## 2013-08-20 DIAGNOSIS — G9340 Encephalopathy, unspecified: Secondary | ICD-10-CM | POA: Diagnosis not present

## 2013-08-20 DIAGNOSIS — F200 Paranoid schizophrenia: Secondary | ICD-10-CM | POA: Diagnosis not present

## 2013-08-20 LAB — COMPREHENSIVE METABOLIC PANEL
ALT: 28 U/L (ref 0–53)
AST: 40 U/L — AB (ref 0–37)
Albumin: 2.6 g/dL — ABNORMAL LOW (ref 3.5–5.2)
Alkaline Phosphatase: 110 U/L (ref 39–117)
Anion gap: 20 — ABNORMAL HIGH (ref 5–15)
BUN: 46 mg/dL — ABNORMAL HIGH (ref 6–23)
CALCIUM: 8.6 mg/dL (ref 8.4–10.5)
CO2: 21 mEq/L (ref 19–32)
CREATININE: 2.93 mg/dL — AB (ref 0.50–1.35)
Chloride: 102 mEq/L (ref 96–112)
GFR calc Af Amer: 25 mL/min — ABNORMAL LOW (ref >90)
GFR calc non Af Amer: 21 mL/min — ABNORMAL LOW (ref >90)
Glucose, Bld: 130 mg/dL — ABNORMAL HIGH (ref 70–99)
Potassium: 4.5 mEq/L (ref 3.7–5.3)
Sodium: 143 mEq/L (ref 137–147)
TOTAL PROTEIN: 6.3 g/dL (ref 6.0–8.3)
Total Bilirubin: 0.3 mg/dL (ref 0.3–1.2)

## 2013-08-20 LAB — URINE CULTURE
COLONY COUNT: NO GROWTH
Culture: NO GROWTH

## 2013-08-20 LAB — GLUCOSE, CAPILLARY
GLUCOSE-CAPILLARY: 148 mg/dL — AB (ref 70–99)
GLUCOSE-CAPILLARY: 137 mg/dL — AB (ref 70–99)
GLUCOSE-CAPILLARY: 124 mg/dL — AB (ref 70–99)
GLUCOSE-CAPILLARY: 142 mg/dL — AB (ref 70–99)

## 2013-08-20 LAB — CBC
HCT: 34.3 % — ABNORMAL LOW (ref 39.0–52.0)
Hemoglobin: 10.9 g/dL — ABNORMAL LOW (ref 13.0–17.0)
MCH: 28.9 pg (ref 26.0–34.0)
MCHC: 31.8 g/dL (ref 30.0–36.0)
MCV: 91 fL (ref 78.0–100.0)
PLATELETS: 477 10*3/uL — AB (ref 150–400)
RBC: 3.77 MIL/uL — ABNORMAL LOW (ref 4.22–5.81)
RDW: 12.9 % (ref 11.5–15.5)
WBC: 12.9 10*3/uL — ABNORMAL HIGH (ref 4.0–10.5)

## 2013-08-20 LAB — CK: Total CK: 2279 U/L — ABNORMAL HIGH (ref 7–232)

## 2013-08-20 MED ORDER — HYDROCODONE-ACETAMINOPHEN 5-325 MG PO TABS
1.0000 | ORAL_TABLET | Freq: Three times a day (TID) | ORAL | Status: DC | PRN
  Administered 2013-08-25: 1 via ORAL
  Filled 2013-08-20: qty 1

## 2013-08-20 MED ORDER — TAMSULOSIN HCL 0.4 MG PO CAPS
0.4000 mg | ORAL_CAPSULE | Freq: Every day | ORAL | Status: DC
  Administered 2013-08-20 – 2013-08-24 (×4): 0.4 mg via ORAL
  Filled 2013-08-20 (×6): qty 1

## 2013-08-20 MED ORDER — HALOPERIDOL 5 MG PO TABS
15.0000 mg | ORAL_TABLET | Freq: Two times a day (BID) | ORAL | Status: DC
  Administered 2013-08-20 – 2013-08-24 (×10): 15 mg via ORAL
  Filled 2013-08-20 (×12): qty 3

## 2013-08-20 MED ORDER — ASPIRIN EC 81 MG PO TBEC
81.0000 mg | DELAYED_RELEASE_TABLET | Freq: Every day | ORAL | Status: DC
  Administered 2013-08-20 – 2013-08-24 (×4): 81 mg via ORAL
  Filled 2013-08-20 (×6): qty 1

## 2013-08-20 MED ORDER — ENSURE COMPLETE PO LIQD
237.0000 mL | Freq: Three times a day (TID) | ORAL | Status: DC
  Administered 2013-08-20 – 2013-08-21 (×2): 237 mL via ORAL

## 2013-08-20 MED ORDER — ATENOLOL 100 MG PO TABS
100.0000 mg | ORAL_TABLET | Freq: Every day | ORAL | Status: DC
  Administered 2013-08-21 – 2013-08-25 (×5): 100 mg via ORAL
  Filled 2013-08-20 (×5): qty 1

## 2013-08-20 MED ORDER — HALOPERIDOL LACTATE 5 MG/ML IJ SOLN
1.0000 mg | Freq: Once | INTRAMUSCULAR | Status: AC
  Administered 2013-08-20: 1 mg via INTRAVENOUS
  Filled 2013-08-20: qty 1

## 2013-08-20 MED ORDER — OXCARBAZEPINE 300 MG PO TABS
450.0000 mg | ORAL_TABLET | Freq: Two times a day (BID) | ORAL | Status: DC
  Administered 2013-08-20 – 2013-08-24 (×10): 450 mg via ORAL
  Filled 2013-08-20 (×12): qty 1

## 2013-08-20 MED ORDER — CLONIDINE HCL 0.3 MG/24HR TD PTWK
0.3000 mg | MEDICATED_PATCH | TRANSDERMAL | Status: DC
  Administered 2013-08-20: 0.3 mg via TRANSDERMAL
  Filled 2013-08-20: qty 1

## 2013-08-20 MED ORDER — ONDANSETRON HCL 4 MG PO TABS
4.0000 mg | ORAL_TABLET | Freq: Four times a day (QID) | ORAL | Status: DC | PRN
Start: 2013-08-20 — End: 2013-08-25

## 2013-08-20 MED ORDER — HYDRALAZINE HCL 50 MG PO TABS
50.0000 mg | ORAL_TABLET | Freq: Three times a day (TID) | ORAL | Status: DC
  Administered 2013-08-20 – 2013-08-25 (×15): 50 mg via ORAL
  Filled 2013-08-20 (×18): qty 1

## 2013-08-20 MED ORDER — TRIHEXYPHENIDYL HCL 5 MG PO TABS
5.0000 mg | ORAL_TABLET | Freq: Every day | ORAL | Status: DC
  Administered 2013-08-20 – 2013-08-24 (×5): 5 mg via ORAL
  Filled 2013-08-20 (×6): qty 1

## 2013-08-20 MED ORDER — CLONAZEPAM 0.5 MG PO TABS
0.2500 mg | ORAL_TABLET | Freq: Two times a day (BID) | ORAL | Status: DC
  Administered 2013-08-20 – 2013-08-24 (×8): 0.25 mg via ORAL
  Filled 2013-08-20 (×11): qty 1

## 2013-08-20 MED ORDER — ATENOLOL 100 MG PO TABS: 100.0000 mg | ORAL_TABLET | Freq: Every day | ORAL | Status: DC

## 2013-08-20 MED ORDER — HALOPERIDOL LACTATE 5 MG/ML IJ SOLN: 1.0000 mg | Freq: Once | INTRAMUSCULAR | Status: DC

## 2013-08-20 MED ORDER — PANTOPRAZOLE SODIUM 40 MG PO TBEC
40.0000 mg | DELAYED_RELEASE_TABLET | Freq: Every day | ORAL | Status: DC
  Administered 2013-08-20 – 2013-08-24 (×5): 40 mg via ORAL
  Filled 2013-08-20 (×7): qty 1

## 2013-08-20 MED ORDER — TRIHEXYPHENIDYL HCL 5 MG PO TABS
2.5000 mg | ORAL_TABLET | Freq: Every morning | ORAL | Status: DC
  Administered 2013-08-22 – 2013-08-24 (×3): 2.5 mg via ORAL
  Filled 2013-08-20 (×5): qty 1

## 2013-08-20 MED ORDER — AMLODIPINE BESYLATE 10 MG PO TABS
10.0000 mg | ORAL_TABLET | Freq: Every day | ORAL | Status: DC
  Administered 2013-08-20: 10 mg via ORAL
  Filled 2013-08-20: qty 1

## 2013-08-20 MED ORDER — MORPHINE SULFATE ER 15 MG PO TBCR
15.0000 mg | EXTENDED_RELEASE_TABLET | Freq: Two times a day (BID) | ORAL | Status: DC
  Administered 2013-08-20 – 2013-08-24 (×10): 15 mg via ORAL
  Filled 2013-08-20 (×12): qty 1

## 2013-08-20 MED ORDER — VITAMIN B-12 1000 MCG PO TABS
1000.0000 ug | ORAL_TABLET | Freq: Every day | ORAL | Status: DC
  Administered 2013-08-20 – 2013-08-24 (×4): 1000 ug via ORAL
  Filled 2013-08-20 (×6): qty 1

## 2013-08-20 MED ORDER — POLYETHYLENE GLYCOL 3350 17 G PO PACK
17.0000 g | PACK | Freq: Every day | ORAL | Status: DC | PRN
  Administered 2013-08-25: 17 g via ORAL
  Filled 2013-08-20: qty 1

## 2013-08-20 MED ORDER — TRIHEXYPHENIDYL HCL 5 MG PO TABS: 2.5000 mg | ORAL_TABLET | ORAL | Status: DC

## 2013-08-20 NOTE — Progress Notes (Signed)
Speech Language Pathology Treatment: Dysphagia  Patient Details Name: David Lang MRN: [id number] DOB: [birth year] Today's Date: 08/20/2013 Time: 4098-1191 SLP Time Calculation (min): 10 min  Assessment / Plan / Recommendation Clinical Impression  Pt with much improved mental status/participation today.  He self fed approx 12 ounces of liquid via straw with good overall tolerance.  Pt repeatedly stated "I want more sprite" and was very impulsive with consumption.  Cough x1 of approx 20 boluses noted.  Adequate mastication of peaches noted and pt declined to consume any solids stating "I'm too thirsty".   Pt did not open mouth for SLP to assess for stasis, however his articulation ability intact indicating adequate oral control.    Recommend pt have regular/thin diet - allowing him to feed himself for maximal airway protection.  Suspect pt at his baseline swallow ability level and SlP will sign off.   RN advised.    HPI HPI: Pt is a 63 y/o M w/ PMHx of HTN, DM, and paranoid schizophrenia who presents from his ALF at [facility] w/ acute encephalopathy, when staff reportedly found him with AMS. Pt was last admitted on 7/17 for a swollen left leg 2/2 to fall and d/c'd 7/23.  Pt failed a bedside swallow evaluation yesterday and today is seen for po readiness.     Pertinent Vitals Afebrile, decreased  SLP Plan  All goals met    Recommendations Diet recommendations: Regular;Thin liquid Liquids provided via: Straw;Cup Medication Administration: Whole meds with liquid Supervision: Patient able to self feed Postural Changes and/or Swallow Maneuvers: Seated upright 90 degrees              Oral Care Recommendations: Oral care BID Follow up Recommendations: None Plan: All goals met    [place], [place] [facility]

## 2013-08-20 NOTE — Progress Notes (Signed)
IV team note: Called to fix or restart piv.Marland Kitchen. Upon attempt to flush with normal saline, pt. Became very paranoid.  "you cannot inject me with anything, how do I know what that is"  Explained to pt that the flush was routine care of the iv, showed him the syringe, with normal saline label.Marland Kitchen.Marland Kitchen.Allowed me to redress iv, straighten kink, however upon flush, pt again became disturbed, "You are injecting me, I said I don't want any  injections." Staff RN at bedside, assisted with redress, she flushed with small amount of ns, just to verify patency.   Lyna PoserLisa Maylie Ashton RN

## 2013-08-20 NOTE — Progress Notes (Signed)
Pt at nursing station in recliner. Pt began yelling to call police and trying to get out of chair and pulling at IV line and urinary catheter. TCT MD. Securtiy paged. Security assisted with getting pt back to bed. Dr. Virgina OrganQureshi at bedside. Order received for soft wrist restraint. Pt yelling that this is not right stating it is the [place] government. Calming advised pt that pt's safety is a concern. Bed alarm on. Pt in camera room. Continuing to monitor. Hortencia ConradiWendi Tomasina Keasling, RN

## 2013-08-20 NOTE — Progress Notes (Signed)
Pt out of restraints since 1600. Pt accepting medication administration. Frequent attendance to toileting needs. Pt consumed 100% ensure this afternoon and 100% dinner meal. Bed alarm on. IVF currently off d/t poor venous access. Will continue to monitor. Hortencia ConradiWendi Yanitza Shvartsman, RN

## 2013-08-20 NOTE — Progress Notes (Signed)
Internal Medicine Attending  Date: 08/20/2013  Patient name: Verdell CarmineFuad Ruffalo Medical record number: [id number] Date of birth: 03/21/1950 Age: 63 y.o. Gender: male  I saw and evaluated the patient. I developed the assessment and plan with the housestaff and reviewed the resident's note by Dr. Virgina OrganQureshi.  I agree with the resident's findings and plans as documented in her progress note.  [name] was alert this morning. He remains pathologically paranoid and refused to allow us to examine him. I believe the so-called acute encephalopathy yesterday was secondary to day-night shifting. We hope to keep him awake during the day so that he'll sleep better at night. The key today will be to clear his swallowing so he can resume his oral medications. We will do our best to maintain the IV and give him IV fluids as long as we're able to do so. He is not a candidate to return to the assisted living facility without closer supervision obviously as he has failed numerous times this month. We will ask social work to assist with skilled nursing facility placement until his periprosthetic hip fracture issue has been addressed.

## 2013-08-20 NOTE — Progress Notes (Signed)
Subjective: David Lang was seen and examined today. He was initially by the nursing station then seen again in his room. He has been agitated this morning, back to his baseline, yelling out. He needs to be cleared by SLP for resuming PO that can hopefully be done very soon. Currently placed in wrist restraints as he has been trying to pull his IV out and his mittens with increased agitation.   Objective: Vital signs in last 24 hours: Filed Vitals:   08/19/13 0924 08/19/13 0938 08/19/13 2021 08/20/13 0419  BP: 125/68  166/76 180/100  Pulse: 73  92 81  Temp: 98.9 F (37.2 C)  97.6 F (36.4 C) 98.1 F (36.7 C)  TempSrc: Oral  Oral Oral  Resp: 16  17 18   Height:  6' 0.05" (1.83 m)    Weight:  205 lb 11 oz (93.3 kg)    SpO2: 93%  97% 95%   Weight change:   Intake/Output Summary (Last 24 hours) at 08/20/13 0955 Last data filed at 08/20/13 0300  Gross per 24 hour  Intake   3200 ml  Output      0 ml  Net   3200 ml   Vitals reviewed. General: lying in bed, agitated, yelling out HEENT: EOMI, not wearing glasses Cardiac/pulm/abd: Refused exam Ext: moving all 4 extremities, foam dressings in place on knees Neuro: responds to his name and to questions, wants me to call the police to answer my questions.   Lab Results: Basic Metabolic Panel:  Recent Labs Lab 08/19/13 0405 08/20/13 0535  NA 136* 143  K 5.3 4.5  CL 93* 102  CO2 24 21  GLUCOSE 169* 130*  BUN 53* 46*  CREATININE 3.85* 2.93*  CALCIUM 9.3 8.6  PHOS 4.7*  --    Liver Function Tests:  Recent Labs Lab 08/19/13 0405 08/20/13 0535  AST 59* 40*  ALT 26 28  ALKPHOS 119* 110  BILITOT 0.4 0.3  PROT 6.9 6.3  ALBUMIN 3.0* 2.6*   CBC:  Recent Labs Lab 08/19/13 0530 08/20/13 0535  WBC 17.5* 12.9*  NEUTROABS 14.3*  --   HGB 11.3* 10.9*  HCT 34.4* 34.3*  MCV 89.4 91.0  PLT 475* 477*   Cardiac Enzymes:  Recent Labs Lab 08/19/13 0405 08/20/13 0535  CKTOTAL 4024* 2279*  TROPONINI <0.30  --     CBG:  Recent Labs Lab 08/17/13 0739 08/17/13 1206 08/19/13 0747 08/19/13 1148 08/19/13 2009 08/20/13 0400  GLUCAP 239* 175* 166* 135* 148* 137*   Urine Drug Screen: Drugs of Abuse     Component Value Date/Time   LABOPIA POSITIVE* 08/11/2013 1150   COCAINSCRNUR NONE DETECTED 08/11/2013 1150   LABBENZ NONE DETECTED 08/11/2013 1150   AMPHETMU NONE DETECTED 08/11/2013 1150   THCU NONE DETECTED 08/11/2013 1150   LABBARB NONE DETECTED 08/11/2013 1150    Urinalysis:  Recent Labs Lab 08/19/13 0402  COLORURINE YELLOW  LABSPEC 1.020  PHURINE 5.5  GLUCOSEU 250*  HGBUR TRACE*  BILIRUBINUR SMALL*  KETONESUR NEGATIVE  PROTEINUR >300*  UROBILINOGEN 0.2  NITRITE NEGATIVE  LEUKOCYTESUR NEGATIVE   Micro Results: Recent Results (from the past 240 hour(s))  MRSA PCR SCREENING     Status: Abnormal   Collection Time    08/11/13  8:22 PM      Result Value Ref Range Status   MRSA by PCR POSITIVE (*) NEGATIVE Final   Comment:            The GeneXpert MRSA Assay (FDA  approved for NASAL specimens     only), is one component of a     comprehensive MRSA colonization     surveillance program. It is not     intended to diagnose MRSA     infection nor to guide or     monitor treatment for     MRSA infections.     RESULT CALLED TO, READ BACK BY AND VERIFIED WITHAngelina Sheriff:     C BOKIAGON RN [phone] 08/11/13 A BROWNING   Studies/Results: Dg Chest 2 View  08/19/2013   CLINICAL DATA:  Chest pain.  EXAM: CHEST  2 VIEW  COMPARISON:  Chest radiograph from 04/01/2012  FINDINGS: The lungs are well-aerated. There is elevation of the right hemidiaphragm. Minimal left basilar opacity likely reflects atelectasis. Mild peribronchial thickening is noted. No pleural effusion or pneumothorax is seen.  The heart is normal in size; the mediastinal contour is within normal limits. No acute osseous abnormalities are seen. There is mild chronic deformity of the proximal right humerus.  IMPRESSION: Elevation of the right  hemidiaphragm. Minimal left basilar opacity likely reflects atelectasis. Mild peribronchial thickening noted.   Electronically Signed   By: Roanna RaiderJeffery  Chang M.D.   On: 08/19/2013 05:29   Ct Head Wo Contrast  08/19/2013   CLINICAL DATA:  Altered mental status.  EXAM: CT HEAD WITHOUT CONTRAST  TECHNIQUE: Contiguous axial images were obtained from the base of the skull through the vertex without intravenous contrast.  COMPARISON:  CT of the head from 08/11/2013  FINDINGS: There is no evidence of acute infarction, mass lesion, or intra- or extra-axial hemorrhage on CT.  Prominence of the ventricles and sulci reflects moderate cortical volume loss. Scattered periventricular and subcortical white matter change likely reflects small vessel ischemic microangiopathy. A chronic lacunar infarct is noted in the right caudate. Cerebellar atrophy is noted.  The brainstem and fourth ventricle are within normal limits. The basal ganglia are unremarkable in appearance. The cerebral hemispheres demonstrate grossly normal gray-white differentiation. No mass effect or midline shift is seen.  There is no evidence of fracture; visualized osseous structures are unremarkable in appearance. The visualized portions of the orbits are within normal limits. The paranasal sinuses and mastoid air cells are well-aerated. No significant soft tissue abnormalities are seen.  IMPRESSION: 1. No acute intracranial pathology seen on CT. 2. Moderate cortical volume loss and scattered small vessel ischemic microangiopathy. Chronic lacunar infarct at the right caudate.   Electronically Signed   By: Roanna RaiderJeffery  Chang M.D.   On: 08/19/2013 05:19   Dg Hip Portable 1 View Right  08/19/2013   CLINICAL DATA:  Pain  EXAM: PORTABLE RIGHT HIP - 1 VIEW  COMPARISON:  None.  FINDINGS: Frontal view obtained. There is a total hip prosthesis on the left which appears well seated. No acute fracture or dislocation on this single view. There is mild narrowing of the right  hip joint.  IMPRESSION: Total hip prosthesis present on the left. Mild narrowing right hip joint. No fracture or dislocation seen on this single view.   Electronically Signed   By: Bretta BangWilliam  Woodruff M.D.   On: 08/19/2013 14:30   Dg Foot Complete Right  08/19/2013   CLINICAL DATA:  Pain and swelling post trauma  EXAM: RIGHT FOOT COMPLETE - 3+ VIEW  COMPARISON:  None.  FINDINGS: Frontal and lateral views were obtained. There are flexion deformities at multiple distal joints. No acute fracture or dislocation. There is mild narrowing of all PIP and DIP joints. No  erosive change. There is a minimal inferior calcaneal spur. There are foci of arterial vascular calcification.  IMPRESSION: Flexion deformities of multiple distal joints. No acute fracture or dislocation. Mild narrowing of multiple distal joints. Extensive arterial vascular calcification consistent with diabetes mellitus.   Electronically Signed   By: Bretta Bang M.D.   On: 08/19/2013 14:29   Medications: I have reviewed the patient's current medications. Scheduled Meds: . ampicillin-sulbactam (UNASYN) IV  3 g Intravenous Q12H  . antiseptic oral rinse  15 mL Mouth Rinse q12n4p  . chlorhexidine  15 mL Mouth Rinse BID  . heparin  5,000 Units Subcutaneous 3 times per day   Continuous Infusions: . sodium chloride 200 mL/hr at 08/20/13 0828   PRN Meds:. Assessment/Plan: Principal Problem:   Acute encephalopathy Active Problems:   Hypertension   Schizophrenia, paranoid type   Type II or unspecified type diabetes mellitus without mention of complication, uncontrolled   Chronic anemia   Periprosthetic hip fracture--nondisplaced, Left   Hyponatremia   Acute renal failure superimposed on stage 3 chronic kidney disease   Encephalopathy   Multiple abrasions b/l knees   Rhabdomyolysis   Elevated LFTs  Acute encephalopathy in the setting of paranoid schizophrenia--back to baseline. Agitated this morning. Likely secondary to inappropriate  day-night shifting that has improved after he slept last night. Concern for aspiration still remains as he has been NPO, but we will try to have SLP evaluate him again as soon as possible. RN claims he did well with sips of water this morning without any signs of aspiration. I suspect given improved mental status and his willingness to eat today (juice and ice cream) we will be able to safely advance his diet.  -resume home medications once safe to swallow pills -IM haldol prn for now, until IV replaced -continue IV Unasyn if IV functioning, RN will check now that he is in restraints and if not, will try to replace IV likely with assistance of IV team -mittens and soft wrist restraints at this time, d/c wrist restraints when more calm, keep mittens on for protection of IV if possible  Leukocytosis with presumed aspiration PNA in the setting of acute encephalopathy--as per explanation above. Remains afebrile. Leukocytosis trending down from 17.5 on admission to 12.9 today. Less suspicious of UTI.  -Remains on IV unasyn while IV in place, will transition to po clindamycin 450mg  TID for total 7 days as it is in capsule form and likely easier to swallow than augmentin. Today is day 2/7 -If febrile, will order blood cultures -urine culture pending -D/C foley once more calm  Rhabdomyolysis with acute on chronic renal failure and elevated LFTs--likely secondary to falls and decreased mobility in setting of recent hip fracture.  Improving with aggressive IVF hydration since yesterday. Cr down from 3.85 to 2.93 this morning, k improved to 4.5, and LFTs trending down. CK down from 4024 on admission to 2279 today. Given his agitation and baseline mental status, difficult to keep IV in place and adequate fluid intake, but we will do our best to recover renal function as much as possible. Pre-renal etiology is evident given improvement with hydration as well.  -continue IVF if possible -if able to tolerate po, will  encourage fluid intake as much as he allows -continue to hold ACEi -trend renal function and CK if he allows  Frequent falls--2 more when back to ALF. Will resume PT once he is more calm and hopefully he will be accepted for SNF this admission. Thankfully,  R foot xray from yesterday with no acute fracture or dislocation, but does show flexion deformities and extensive arterial vascular calcifications.  Portable R hip xray with no fracture or dislocation seen.  -PT -dispo pending hopeful SNF admission. He continues to return to the hospital from his ALF  HTN--elevated today in setting of increased agitation.  -resume home medications including amlodipine, atenolol, hydralazine -replace clonidine patch -continue to hold lisinopril in setting of AKI  DM2 -monitor cbg's while nope -SSI sensitive once po diet resumed  Diet: NPO for now pending SLP DVT PPx: Heparin Dispo: Disposition is deferred at this time, awaiting hopeful SNF placement. Will ask for social work assistance again.   The patient does have a current PCP (Provider Default, MD) and will have hospital follow-up appointment after discharge at SNF.  The patient does not have transportation limitations that hinder transportation to clinic appointments.  Services Needed at time of discharge: Y = Yes, Blank = No PT: SNF if possible and approved by insurance  OT:   RN:   Equipment:   Other:     LOS: 1 day   Darden Palmer, MD 08/20/2013, 9:55 AM

## 2013-08-20 NOTE — Progress Notes (Signed)
Received call from patient's sister, Dorna MaiLeila. Dorna MaiLeila was advised of pt situation, and of need for soft wrist restraints. [name] verbalized understanding stating that when pt resumes his medication he will be a different person. Will continue to monitor. Hortencia ConradiWendi Merridy Pascoe, RN

## 2013-08-20 NOTE — Progress Notes (Signed)
Went to see patient as RN called that patient is agitated and not redirectable.  Patient sleeping upon arrival. Had a large BM few mins ago per RN and was sleeping afterward at nursing station.  Asked RN to call back if agitated again and would consider IM haldol 1mg  then.

## 2013-08-20 NOTE — Evaluation (Signed)
Physical Therapy Evaluation Patient Details Name: David Lang MRN: [id number] DOB: [birth year] Today's Date: David Lang   History of Present Illness  Admitted with Falls in the setting of L Nondisplaced periprosthetic proximal femur fracture; Acute encephalopathy w/ unclear etiology; Rhabdomyolysis with anion gap metabolic acidosis and elevated LFTs; Acute on chronic renal failure in setting of CKD3; Schizophrenia; 2 recent hospital admissions  Clinical Impression   Pt admitted with above. Pt currently with functional limitations due to the deficits listed below (see PT Problem List).  Pt will benefit from skilled PT to increase their independence and safety with mobility to allow discharge to the venue listed below.   Clearly his current living situation isn't meeting his needs; Recommend dc to SNF      Follow Up Recommendations SNF;Supervision/Assistance - 24 hour    Equipment Recommendations  Wheelchair (measurements PT);Wheelchair cushion (measurements PT);Rolling walker with 5" wheels;3in1 (PT)    Recommendations for Other Services       Precautions / Restrictions Precautions Precautions: Fall Restrictions LLE Weight Bearing: Non weight bearing      Mobility  Bed Mobility Overal bed mobility: Needs Assistance;+2 for physical assistance Bed Mobility: Sit to Supine       Sit to supine: +2 for physical assistance;Max assist   General bed mobility comments: Cues for technique, and eventually required hand over hand guidance and lifting bil LEs to lay back down  Transfers Overall transfer level: Needs assistance Equipment used: 2 person hand held assist Transfers: Sit to/from Stand;Stand Pivot Transfers Sit to Stand: Max assist;+2 physical assistance Stand pivot transfers: +2 physical assistance;Max assist       General transfer comment: Basic stand pivot from bedsie commode gback to bed on pt's Right side; Continued decr ability to maintain NWB LLE    Ambulation/Gait                Stairs            Wheelchair Mobility    Modified Rankin (Stroke Patients Only)       Balance   Sitting-balance support: Bilateral upper extremity supported Sitting balance-Leahy Scale: Fair     Standing balance support: Bilateral upper extremity supported Standing balance-Leahy Scale: Poor                               Pertinent Vitals/Pain Reports pain, unable to rate patient repositioned for comfort     Home Living Family/patient expects to be discharged to:: Skilled nursing facility   Available Help at Discharge: Skilled Nursing Facility Type of Home: Skilled Nursing Facility           Additional Comments: Pt unable to answer most questions concerning home living. [place] able [place].    Prior Function Level of Independence: Needs assistance         Comments: Required max assist for transfers during previous admission     Hand Dominance        Extremity/Trunk Assessment   Upper Extremity Assessment: Overall WFL for tasks assessed           Lower Extremity Assessment: LLE deficits/detail   LLE Deficits / Details: Decreased strength and ROM as expected post fx - limited assessment due to pain.     Communication   Communication:  (Heavy accent; speaks [place])  Cognition Arousal/Alertness: Awake/alert Behavior During Therapy: Flat affect Overall Cognitive Status: History of cognitive impairments -  at baseline       Memory: Decreased recall of precautions;Decreased short-term memory              General Comments      Exercises        Assessment/Plan    PT Assessment Patient needs continued PT services  PT Diagnosis Generalized weakness;Acute pain   PT Problem List Decreased strength;Decreased range of motion;Decreased activity tolerance;Decreased balance;Decreased mobility;Decreased cognition;Decreased knowledge of  use of DME;Decreased safety awareness;Decreased knowledge of precautions;Pain  PT Treatment Interventions DME instruction;Functional mobility training;Therapeutic activities;Therapeutic exercise;Balance training;Neuromuscular re-education;Cognitive remediation;Patient/family education;Wheelchair mobility training;Modalities   PT Goals (Current goals can be found in the Care Plan section) Acute Rehab PT Goals Patient Stated Goal: Did not state PT Goal Formulation: Patient unable to participate in goal setting Time For Goal Achievement: 09/03/13 Potential to Achieve Goals: Fair    Frequency Min 3X/week   Barriers to discharge Decreased caregiver support Needs SNF placement for adequate assist    Co-evaluation               End of Session Equipment Utilized During Treatment: Gait belt Activity Tolerance: Patient limited by pain Patient left: in bed;with call bell/phone within reach;with nursing/sitter in room Nurse Communication: Mobility status         Time: [id number] PT Time Calculation (min): 10 min   Charges:   PT Evaluation $Initial PT Evaluation Tier I: 1 Procedure     PT G CodesOlen Lang:          David Lang David Lang, 5:52 PM  David Lang, PT  Acute Rehabilitation Services Pager [phone] Office [phone]

## 2013-08-21 DIAGNOSIS — G9340 Encephalopathy, unspecified: Secondary | ICD-10-CM | POA: Diagnosis not present

## 2013-08-21 DIAGNOSIS — M6282 Rhabdomyolysis: Secondary | ICD-10-CM | POA: Diagnosis not present

## 2013-08-21 DIAGNOSIS — D72829 Elevated white blood cell count, unspecified: Secondary | ICD-10-CM | POA: Diagnosis not present

## 2013-08-21 DIAGNOSIS — J690 Pneumonitis due to inhalation of food and vomit: Secondary | ICD-10-CM

## 2013-08-21 DIAGNOSIS — R296 Repeated falls: Secondary | ICD-10-CM

## 2013-08-21 DIAGNOSIS — F200 Paranoid schizophrenia: Secondary | ICD-10-CM | POA: Diagnosis not present

## 2013-08-21 HISTORY — DX: Pneumonitis due to inhalation of food and vomit: J69.0

## 2013-08-21 LAB — BASIC METABOLIC PANEL
ANION GAP: 15 (ref 5–15)
BUN: 35 mg/dL — AB (ref 6–23)
CHLORIDE: 99 meq/L (ref 96–112)
CO2: 25 mEq/L (ref 19–32)
Calcium: 8.3 mg/dL — ABNORMAL LOW (ref 8.4–10.5)
Creatinine, Ser: 2.21 mg/dL — ABNORMAL HIGH (ref 0.50–1.35)
GFR calc non Af Amer: 30 mL/min — ABNORMAL LOW (ref >90)
GFR, EST AFRICAN AMERICAN: 35 mL/min — AB (ref >90)
Glucose, Bld: 175 mg/dL — ABNORMAL HIGH (ref 70–99)
POTASSIUM: 4.1 meq/L (ref 3.7–5.3)
Sodium: 139 mEq/L (ref 137–147)

## 2013-08-21 LAB — GLUCOSE, CAPILLARY
GLUCOSE-CAPILLARY: 147 mg/dL — AB (ref 70–99)
GLUCOSE-CAPILLARY: 147 mg/dL — AB (ref 70–99)
GLUCOSE-CAPILLARY: 130 mg/dL — AB (ref 70–99)
Glucose-Capillary: 215 mg/dL — ABNORMAL HIGH (ref 70–99)

## 2013-08-21 LAB — CK: Total CK: 998 U/L — ABNORMAL HIGH (ref 7–232)

## 2013-08-21 MED ORDER — ENSURE COMPLETE PO LIQD
237.0000 mL | Freq: Two times a day (BID) | ORAL | Status: DC
  Administered 2013-08-22 – 2013-08-23 (×3): 237 mL via ORAL

## 2013-08-21 MED ORDER — CLINDAMYCIN HCL 300 MG PO CAPS
450.0000 mg | ORAL_CAPSULE | Freq: Three times a day (TID) | ORAL | Status: DC
  Administered 2013-08-21 – 2013-08-25 (×12): 450 mg via ORAL
  Filled 2013-08-21 (×17): qty 1

## 2013-08-21 MED ORDER — INSULIN ASPART 100 UNIT/ML ~~LOC~~ SOLN
0.0000 [IU] | Freq: Three times a day (TID) | SUBCUTANEOUS | Status: DC
  Administered 2013-08-21: 3 [IU] via SUBCUTANEOUS
  Administered 2013-08-23: 2 [IU] via SUBCUTANEOUS
  Administered 2013-08-23: 1 [IU] via SUBCUTANEOUS
  Administered 2013-08-24: 3 [IU] via SUBCUTANEOUS
  Administered 2013-08-24: 1 [IU] via SUBCUTANEOUS

## 2013-08-21 MED ORDER — ENOXAPARIN SODIUM 40 MG/0.4ML ~~LOC~~ SOLN
40.0000 mg | SUBCUTANEOUS | Status: DC
  Administered 2013-08-21 – 2013-08-24 (×4): 40 mg via SUBCUTANEOUS
  Filled 2013-08-21 (×5): qty 0.4

## 2013-08-21 MED ORDER — LISINOPRIL 10 MG PO TABS
10.0000 mg | ORAL_TABLET | Freq: Every day | ORAL | Status: DC
  Administered 2013-08-22 – 2013-08-25 (×4): 10 mg via ORAL
  Filled 2013-08-21 (×5): qty 1

## 2013-08-21 NOTE — Progress Notes (Signed)
INITIAL NUTRITION ASSESSMENT  DOCUMENTATION CODES Per approved criteria  -Not Applicable   INTERVENTION: Ensure Complete po BID, each supplement provides 350 kcal and 13 grams of protein  NUTRITION DIAGNOSIS: Inadequate oral intake related to paranoid schizophrenia as evidenced by po intake less than estimated needs.   Goal: Pt to meet >/= 90% of their estimated nutrition needs   Monitor:  Weight trends, po intake, acceptance of supplements, labs  Reason for Assessment: MST  63 y.o. male  Admitting Dx: Rhabdomyolysis  ASSESSMENT: 63 y/o M w/ PMHx of HTN, DM, and paranoid schizophrenia who [facility]/ [facility] encephalopathy. Pt was last admitted on 7/17 for a swollen left leg 2/2 to fall and d/c'd 7/23.   - Per Nurse Tech, pt has not been eating well. Pt had lunch tray in front of him that appeared to be mostly untouched. Pt said he did not want to drink his Ensure Complete because he does not trust it. RD assured pt that it was only a nutritional supplement that would help him maintain a healthy weight with enough protein and calories. Pt with no signs of significant fat and/or muscle wasting.  Na and K WNL CBG's 124-147  Height: Ht Readings from Last 1 Encounters:  08/19/13 6' 0.05" (1.83 m)    Weight: Wt Readings from Last 1 Encounters:  08/19/13 205 lb 11 oz (93.3 kg)    Ideal Body Weight: 77.7 kg  % Ideal Body Weight: 120%  Wt Readings from Last 10 Encounters:  08/19/13 205 lb 11 oz (93.3 kg)  08/12/13 196 lb 10.4 oz (89.2 kg)  03/31/12 178 lb (80.74 kg)  03/31/12 178 lb (80.74 kg)  03/03/12 180 lb 3 oz (81.733 kg)    Usual Body Weight: unknown  % Usual Body Weight: unknown  BMI:  Body mass index is 27.86 kg/(m^2).  Estimated Nutritional Needs: Kcal: 2200-2400 Protein: 115-125 g Fluid: 2.2-2.4 L/day  Skin: abrasion on right and left knees  Diet Order: Carb Control  EDUCATION NEEDS: -Education  not appropriate at this time   Intake/Output Summary (Last 24 hours) at 08/21/13 1539 Last data filed at 08/21/13 1200  Gross per 24 hour  Intake   1690 ml  Output    575 ml  Net   1115 ml    Last BM: 7/27   Labs:   Recent Labs Lab 08/19/13 0405 08/20/13 0535 08/21/13 0433  NA 136* 143 139  K 5.3 4.5 4.1  CL 93* 102 99  CO2 24 21 25   BUN 53* 46* 35*  CREATININE 3.85* 2.93* 2.21*  CALCIUM 9.3 8.6 8.3*  PHOS 4.7*  --   --   GLUCOSE 169* 130* 175*    CBG (last 3)   Recent Labs  08/20/13 1628 08/21/13 0746 08/21/13 1149  GLUCAP 142* 147* 147*    Scheduled Meds: . antiseptic oral rinse  15 mL Mouth Rinse q12n4p  . aspirin EC  81 mg Oral Daily  . atenolol  100 mg Oral Daily  . chlorhexidine  15 mL Mouth Rinse BID  . clindamycin  450 mg Oral 3 times per day  . clonazePAM  0.25 mg Oral BID  . cloNIDine  0.3 mg Transdermal Weekly  . feeding supplement (ENSURE COMPLETE)  237 mL Oral TID BM  . haloperidol  15 mg Oral BID  . heparin  5,000 Units Subcutaneous 3 times per day  . hydrALAZINE  50 mg Oral TID  . insulin aspart  0-9 Units Subcutaneous TID WC  . lisinopril  10 mg Oral Daily  . morphine  15 mg Oral Q12H  . OXcarbazepine  450 mg Oral BID  . pantoprazole  40 mg Oral Daily  . tamsulosin  0.4 mg Oral Daily  . trihexyphenidyl  2.5 mg Oral q morning - 10a  . trihexyphenidyl  5 mg Oral QHS  . vitamin B-12  1,000 mcg Oral Daily    Continuous Infusions:   Past Medical History  Diagnosis Date  . Hypertension   . Diabetes mellitus without complication   . Mental disorder   . Schizophrenia     Past Surgical History  Procedure Laterality Date  . Hip arthroplasty Left 03/31/2012    Procedure: ARTHROPLASTY BIPOLAR HIP;  Surgeon: Mable Paris, MD;  Location: [facility];  Service: Orthopedics;  Laterality: Left;    Ebbie Latus RD, LDN

## 2013-08-21 NOTE — Clinical Social Work Placement (Addendum)
Clinical Social Work Department CLINICAL SOCIAL WORK PLACEMENT NOTE 08/21/2013  Patient:  David Lang,David Lang  Account Number:  [id number] Admit date:  08/19/2013  Clinical Social Worker:  Genelle BalVANESSA Kaylynn Chamblin, LCSW  Date/time:  08/21/2013 04:42 AM  Clinical Social Work is seeking post-discharge placement for this patient at the following level of care:   SKILLED NURSING   (*CSW will update this form in Epic as items are completed)     Patient/family provided with [facility] Department of Clinical Social Work's list of facilities offering this level of care within the geographic area requested by the patient (or if unable, by the patient's family).  08/21/2013  Patient/family informed of their freedom to choose among providers that offer the needed level of care, that participate in Medicare, Medicaid or managed care program needed by the patient, have an available bed and are willing to accept the patient.    Patient/family informed of [facility]' ownership interest in [facility], as well as of the fact that they are under no obligation to receive care at this facility.  PASARR submitted to EDS on 08/13/13 PASARR number received on 08/16/13. PASARR number valid 7/22-9/20/15.  FL2 transmitted to all facilities in geographic area requested by pt/family on  08/21/2013 FL2 transmitted to all facilities within larger geographic area on   Patient informed that his/her managed care company has contracts with or will negotiate with  certain facilities, including the following:     Patient/family informed of bed offers received: 08/22/13  [facility] Physician recommends and patient chooses bed at    [facility]   Patient to be transferred to facility by ambulance Patient and family notified of transfer on 08/25/13 Name of family member notified: Barbie HaggisLeila Melson - sister   The following physician request were  entered in Epic:  Additional Comments: 08/25/13: [facility] and patient's sisters throughout the day regarding discharging patient to SNF today. Sisters informed patient would have to discharge today and if insurance authorization ([employer]BS PPO) did not come through they would have to pay privately, and sisters voiced understanding. As of 4 pm, [facility] and [facility] staff person Clydie BraunKaren regarding paying privately. CSW contacted by Clydie BraunKaren and d/c summary requested.

## 2013-08-21 NOTE — Progress Notes (Signed)
Subjective: [name] was pleasant this morning, but states that he wishes to be out of the hospital. He is no longer in wrist restraints (initially placed when he was trying to pull out his IV).   Objective: Vital signs in last 24 hours: Filed Vitals:   08/20/13 0419 08/20/13 1206 08/20/13 1811 08/21/13 0509  BP: 180/100 189/88 177/80 175/78  Pulse: 81 79 88 74  Temp: 98.1 F (36.7 C) 97.9 F (36.6 C) 97.4 F (36.3 C) 98.1 F (36.7 C)  TempSrc: Oral Oral Oral Oral  Resp: 18 18 12 16   Height:      Weight:      SpO2: 95% 97% 96% 94%   Weight change:   Intake/Output Summary (Last 24 hours) at 08/21/13 0841 Last data filed at 08/21/13 0500  Gross per 24 hour  Intake   1310 ml  Output   2100 ml  Net   -790 ml   Vitals reviewed. General: lying in bed without glasses, eyes closed, resting HEENT: EOMI Cardiac/pulm/abd: Refused exam Ext: moving all 4 extremities, foam dressings in place on right knee, abrasions evident on left knee, non edematous, not weeping Neuro: responds to his name and to questions, asks to get out of the hospital  GU: foley in place  Lab Results: Basic Metabolic Panel:  Recent Labs Lab 08/19/13 0405 08/20/13 0535 08/21/13 0433  NA 136* 143 139  K 5.3 4.5 4.1  CL 93* 102 99  CO2 24 21 25   GLUCOSE 169* 130* 175*  BUN 53* 46* 35*  CREATININE 3.85* 2.93* 2.21*  CALCIUM 9.3 8.6 8.3*  PHOS 4.7*  --   --    Liver Function Tests:  Recent Labs Lab 08/19/13 0405 08/20/13 0535  AST 59* 40*  ALT 26 28  ALKPHOS 119* 110  BILITOT 0.4 0.3  PROT 6.9 6.3  ALBUMIN 3.0* 2.6*   CBC:  Recent Labs Lab 08/19/13 0530 08/20/13 0535  WBC 17.5* 12.9*  NEUTROABS 14.3*  --   HGB 11.3* 10.9*  HCT 34.4* 34.3*  MCV 89.4 91.0  PLT 475* 477*   Cardiac Enzymes:  Recent Labs Lab 08/19/13 0405 08/20/13 0535 08/21/13 0433  CKTOTAL 4024* 2279* 998*  TROPONINI <0.30  --   --    CBG:  Recent Labs Lab 08/19/13 1148 08/19/13 2009 08/20/13 0400  08/20/13 1020 08/20/13 1628 08/21/13 0746  GLUCAP 135* 148* 137* 124* 142* 147*   Urine Drug Screen: Drugs of Abuse     Component Value Date/Time   LABOPIA POSITIVE* 08/11/2013 1150   COCAINSCRNUR NONE DETECTED 08/11/2013 1150   LABBENZ NONE DETECTED 08/11/2013 1150   AMPHETMU NONE DETECTED 08/11/2013 1150   THCU NONE DETECTED 08/11/2013 1150   LABBARB NONE DETECTED 08/11/2013 1150    Urinalysis:  Recent Labs Lab 08/19/13 0402  COLORURINE YELLOW  LABSPEC 1.020  PHURINE 5.5  GLUCOSEU 250*  HGBUR TRACE*  BILIRUBINUR SMALL*  KETONESUR NEGATIVE  PROTEINUR >300*  UROBILINOGEN 0.2  NITRITE NEGATIVE  LEUKOCYTESUR NEGATIVE   Micro Results: Recent Results (from the past 240 hour(s))  MRSA PCR SCREENING     Status: Abnormal   Collection Time    08/11/13  8:22 PM      Result Value Ref Range Status   MRSA by PCR POSITIVE (*) NEGATIVE Final   Comment:            The GeneXpert MRSA Assay (FDA     approved for NASAL specimens     only), is one component  of a     comprehensive MRSA colonization     surveillance program. It is not     intended to diagnose MRSA     infection nor to guide or     monitor treatment for     MRSA infections.     RESULT CALLED TO, READ BACK BY AND VERIFIED WITHAngelina Sheriff RN 1610 08/11/13 A BROWNING  URINE CULTURE     Status: None   Collection Time    08/19/13  4:02 AM      Result Value Ref Range Status   Specimen Description URINE, CATHETERIZED   Final   Special Requests NONE   Final   Culture  Setup Time     Final   Value: 08/19/2013 15:50     Performed at [facility] Count     Final   Value: NO GROWTH     Performed at [facility]   Culture     Final   Value: NO GROWTH     Performed at [facility]   Report Status 08/20/2013 FINAL   Final   Studies/Results: Dg Hip Portable 1 View Right  08/19/2013   CLINICAL DATA:  Pain  EXAM: PORTABLE RIGHT HIP - 1 VIEW  COMPARISON:  None.  FINDINGS: Frontal view  obtained. There is a total hip prosthesis on the left which appears well seated. No acute fracture or dislocation on this single view. There is mild narrowing of the right hip joint.  IMPRESSION: Total hip prosthesis present on the left. Mild narrowing right hip joint. No fracture or dislocation seen on this single view.   Electronically Signed   By: Bretta Bang M.D.   On: 08/19/2013 14:30   Dg Foot Complete Right  08/19/2013   CLINICAL DATA:  Pain and swelling post trauma  EXAM: RIGHT FOOT COMPLETE - 3+ VIEW  COMPARISON:  None.  FINDINGS: Frontal and lateral views were obtained. There are flexion deformities at multiple distal joints. No acute fracture or dislocation. There is mild narrowing of all PIP and DIP joints. No erosive change. There is a minimal inferior calcaneal spur. There are foci of arterial vascular calcification.  IMPRESSION: Flexion deformities of multiple distal joints. No acute fracture or dislocation. Mild narrowing of multiple distal joints. Extensive arterial vascular calcification consistent with diabetes mellitus.   Electronically Signed   By: Bretta Bang M.D.   On: 08/19/2013 14:29   Medications: I have reviewed the patient's current medications. Scheduled Meds: . ampicillin-sulbactam (UNASYN) IV  3 g Intravenous Q12H  . antiseptic oral rinse  15 mL Mouth Rinse q12n4p  . aspirin EC  81 mg Oral Daily  . atenolol  100 mg Oral Daily  . chlorhexidine  15 mL Mouth Rinse BID  . clonazePAM  0.25 mg Oral BID  . cloNIDine  0.3 mg Transdermal Weekly  . feeding supplement (ENSURE COMPLETE)  237 mL Oral TID BM  . haloperidol  15 mg Oral BID  . heparin  5,000 Units Subcutaneous 3 times per day  . hydrALAZINE  50 mg Oral TID  . morphine  15 mg Oral Q12H  . OXcarbazepine  450 mg Oral BID  . pantoprazole  40 mg Oral Daily  . tamsulosin  0.4 mg Oral Daily  . trihexyphenidyl  2.5 mg Oral q morning - 10a  . trihexyphenidyl  5 mg Oral QHS  . vitamin B-12  1,000 mcg Oral  Daily  Continuous Infusions: . sodium chloride Stopped (08/21/13 0720)   PRN Meds:.HYDROcodone-acetaminophen, ondansetron, polyethylene glycol Assessment/Plan: Principal Problem:   Rhabdomyolysis Active Problems:   Hypertension   Schizophrenia, paranoid type   Type II or unspecified type diabetes mellitus without mention of complication, uncontrolled   Chronic anemia   Periprosthetic hip fracture--nondisplaced, Left   Hyponatremia   Acute renal failure superimposed on stage 3 chronic kidney disease   Encephalopathy   Acute encephalopathy   Multiple abrasions b/l knees   Elevated LFTs   Aspiration pneumonia   Falls frequently  63 yo man with HTN, DM and paranoid schizophrenia who presented with acute encephalopathy. Pt was last admitted on 7/17 for a swollen left leg 2/2 to fall and d/c'd 7/23. Since admission, his WBC and CK have trended down.  Acute encephalopathy in the setting of paranoid schizophrenia--back to baseline. Agitated at times overnight, but calm this morning. Likely secondary to inappropriate day-night shifting that has improved since last hospitalization. Concern for aspiration was initially high, but SLP approved regular, thin liquid diet that he can self feed.  - resumed home medications - IV now in place, but patient refusing injections - continue IV Unasyn if patient allows  Leukocytosis with presumed aspiration PNA in the setting of acute encephalopathy--as per explanation above. Remains afebrile. Leukocytosis trending down from 17.5 on admission to 12.9 yesterday. Less suspicious of UTI.  - IV unasyn is held while IV is not in place, will transition to po clindamycin 450mg  TID for total 7 days as it is in capsule form and likely easier to swallow than augmentin. Today is day 3/7 - If febrile, will order blood cultures - Urine culture NGTD  - D/C foley once more calm  Rhabdomyolysis with acute on chronic renal failure and elevated LFTs--likely secondary to  falls and decreased mobility in setting of recent hip fracture.  Improving with aggressive IVF hydration. Cr down from 3.85 to 2.21 this morning, K stable at 4.1, and LFTs trending down. CK down from 4024 on admission to 998 today. Given his agitation and baseline mental status, difficult to keep IV in place; the patient pulled out his IV. However, pre-renal etiology is also evident given improvement with hydration. - Continue IVF if possible; IV currently pulled out - Encouraging fluid intake as much as he allows - Continue to hold ACEi; consider restarting, as patient BPs have been high and etiology is pre-renal - Trend renal function if patient allows blood draws  Frequent falls--2 falls since last week's hospitalization while back to ALF. Has resumed PT - continued recommendations for SNF this admission. Thankfully, R foot xray on this admission showed no acute fracture or dislocation, but does show flexion deformities and extensive arterial vascular calcifications.  Portable R hip xray with no fracture or dislocation seen.  - PT - Dispo pending hopeful SNF admission. He continues to return to the hospital from his ALF and needs 24 hour care.  HTN--elevated periodically during this admission in setting of agitation  - Resume home medications including amlodipine, atenolol, hydralazine - Replace clonidine patch - Continue to hold lisinopril in setting of AKI  DM2 - Monitor cbg's - SSI sensitive resumed  Diet: Carb mod diet; SLP recommends regular, thin liquids with patient able to feed self DVT PPx: Heparin Dispo: Disposition is deferred at this time, awaiting hopeful SNF placement. Calling SW for assistance again.  The patient does have a current PCP (Provider Default, MD) and will have hospital follow-up appointment after discharge at SNF.  The patient does not have transportation limitations that hinder transportation to clinic appointments.  Services Needed at time of discharge: Y =  Yes, Blank = No PT: SNF if possible and approved by insurance  OT:   RN:   Equipment:   Other:     LOS: 2 days   Dionne Ano, MD 08/21/2013, 8:41 AM

## 2013-08-21 NOTE — Progress Notes (Signed)
Internal Medicine Attending  Date: 08/21/2013  Patient name: David Lang Medical record number: [id number] Date of birth: 02/09/1950 Age: 63 y.o. Gender: male  I saw and evaluated the patient. I developed the assessment and plan with the housestaff and reviewed the resident's note by Dr. Leatha GildingMallory.  I agree with the resident's findings and plans as documented in her progress note.  [name] was much calmer when seen on rounds this AM.  Rhabdo resolved and renal function improved with hydration.  Working on SNF placement.

## 2013-08-21 NOTE — Discharge Summary (Signed)
Name: David Lang MRN: [id number] DOB: 16-Mar-1950 63 y.o. PCP: Provider Default, MD  Date of Admission: 08/19/2013  3:11 AM Date of Discharge: 08/25/2013 Attending Physician: [name], MD  Discharge Diagnosis: Principal Problem:   Rhabdomyolysis Active Problems:   Hypertension   Schizophrenia, paranoid type   Type II or unspecified type diabetes mellitus without mention of complication, uncontrolled   Chronic anemia   Periprosthetic hip fracture--nondisplaced, Left   Hyponatremia   Acute renal failure superimposed on stage 3 chronic kidney disease   Encephalopathy   Acute encephalopathy   Multiple abrasions b/l knees   Elevated LFTs   Aspiration pneumonia   Falls frequently  Discharge Medications:   Medication List    ASK your doctor about these medications       amLODipine 10 MG tablet  Commonly known as:  NORVASC  Take 1 tablet (10 mg total) by mouth daily.     aspirin EC 81 MG tablet  Take 81 mg by mouth daily.     atenolol 100 MG tablet  Commonly known as:  TENORMIN  Take 1 tablet (100 mg total) by mouth daily.     clonazePAM 0.5 MG tablet  Commonly known as:  KLONOPIN  Take 0.5 tablets (0.25 mg total) by mouth 2 (two) times daily.     cloNIDine 0.3 mg/24hr patch  Commonly known as:  CATAPRES - Dosed in mg/24 hr  Place 1 patch (0.3 mg total) onto the skin once a week.     cyanocobalamin 1000 MCG tablet  Take 1 tablet (1,000 mcg total) by mouth daily.     feeding supplement (ENSURE COMPLETE) Liqd  Take 237 mLs by mouth 3 (three) times daily between meals.     haloperidol 10 MG tablet  Commonly known as:  HALDOL  Take 15 mg by mouth 2 (two) times daily.     haloperidol 2 MG tablet  Commonly known as:  HALDOL  Take 2 mg by mouth daily as needed (for increased psychosis/hallucinations with agitation or aggression.).     hydrALAZINE 50 MG tablet  Commonly known as:  APRESOLINE  Take 50 mg by mouth 3 (three) times daily.     HYDROcodone-acetaminophen 5-325 MG per tablet  Commonly known as:  NORCO/VICODIN  Take 1 tablet by mouth every 8 (eight) hours as needed (for breakthrough pain).     lisinopril 10 MG tablet  Commonly known as:  PRINIVIL,ZESTRIL  Take 10 mg by mouth daily.     loperamide 2 MG capsule  Commonly known as:  IMODIUM  Take 2 mg by mouth 3 (three) times daily as needed for diarrhea or loose stools.     morphine 15 MG 12 hr tablet  Commonly known as:  MS CONTIN  Take 1 tablet (15 mg total) by mouth every 12 (twelve) hours.     ondansetron 4 MG tablet  Commonly known as:  ZOFRAN  Take 1 tablet (4 mg total) by mouth every 6 (six) hours as needed for nausea.     OXcarbazepine 150 MG tablet  Commonly known as:  TRILEPTAL  Take 3 tablets (450 mg total) by mouth 2 (two) times daily.     OYSTER SHELL CALCIUM PO  Take 1 tablet by mouth daily.     pantoprazole 40 MG tablet  Commonly known as:  PROTONIX  Take 1 tablet (40 mg total) by mouth daily.     polyethylene glycol packet  Commonly known as:  MIRALAX / GLYCOLAX  Take 17 g  by mouth daily as needed for mild constipation.     spironolactone 25 MG tablet  Commonly known as:  ALDACTONE  Take 12.5 mg by mouth daily.     tamsulosin 0.4 MG Caps capsule  Commonly known as:  FLOMAX  Take 0.4 mg by mouth.     trihexyphenidyl 5 MG tablet  Commonly known as:  ARTANE  Take 2.5-5 mg by mouth See admin instructions. Take 2.5mg  by mouth in the morning and take 5mg  by mouth at bedtime.        Disposition and follow-up:   David [facility] St[facility] condition.  At the hospital follow up visit please address:  1.  Patient was admitted after a fall at his ALF; his right foot xray and portable right hip xray showed no fracture or dislocations. He received non-weight-bearing PT 3x/week while hospitalized and was able to participate.  He also was found to have acute encephalopathy in the context of  presumed aspiration pneumonia, acute on chronic renal failure in the setting of CKD3 and rhabdomyolysis. He was treated with hydration and antibiotics and his rhabdomyolysis resolved. He has one clindamycin pill left to complete his full course of antibiotic treatment.  Shortly after admission, the patient started to refuse treatment intervention (pulled out IV, refused labs etc), so further workup was not possible.  2.  Labs / imaging needed at time of follow-up: BMET for creatinine, CBC   3.  Pending labs/ test needing follow-up: none  Follow-up Appointments: care at SNF Follow-up Information   Follow up with Mable ParisHANDLER,JUSTIN WILLIAM, MD.   Specialty:  Orthopedic Surgery   Contact information:   [address] [place] [id number] [phone]       Follow up On 08/30/2013. (10:45 AM)      Discharge Instructions: Please continue to participate in physical therapy ; it will be non-weight bearing until you are reassessed by your orthopaedic surgeon, Dr. Ave Filterhandler.  Continue to drink plenty of fluids.  Rhabdomyolysis Rhabdomyolysis is the breakdown of muscle fibers due to injury. The injury may come from physical damage to the muscle like an injury but other causes are:  High fever (hyperthermia).  Seizures (convulsions).  Low phosphate levels.  Diseases of metabolism.  Heatstroke.  Drug toxicity.  Over exertion.  Alcoholism.  Muscle is cut off from oxygen (anoxia).  The squeezing of nerves and blood vessels (compartment syndrome). Some drugs which may cause the breakdown of muscle are:  Antibiotics.  Statins.  Alcohol.  Animal toxins. Myoglobin is a substance which helps muscle use oxygen. When the muscle is damaged, the myoglobin is released into the bloodstream. It is filtered out of the bloodstream by the kidneys. Myoglobin may block up the kidneys. This may cause damage, such as kidney failure. It also breaks down into other damaging toxic parts,  which also cause kidney failure.  SYMPTOMS   Dark, red, or tea colored urine.  Weakness of affected muscles.  Weight gain from water retention.  Joint aches and pains.  Irregular heart from high potassium in the blood.  Muscle tenderness or aching.  Generalized weakness.  Seizures.  Feeling tired (fatigue). DIAGNOSIS  Your caregiver may find muscle tenderness on exam and suspect the problem. Urine tests and blood work can confirm the problem. TREATMENT   Early and aggressive treatment with large amounts of fluids may help prevent kidney failure.  Water producing medicine (diuretic) may be used to help flush the kidneys.  High potassium and calcium problems (electrolyte) in your blood may need treatment. HOME CARE INSTRUCTIONS  This problem is usually cared for in a hospital. If you are allowed to go home and require dialysis, make sure you keep all appointments for lab work and dialysis. Not doing so could result in death. Document Released: 01-20-04 Document Revised: 04/06/2011 Document Reviewed: 07/09/2008 [facility] Patient Information 2015 [place], [place]. This information is not intended to replace advice given to you by your health care provider. Make sure you discuss any questions you have with your health care provider.  Consultations:  none  Procedures Performed:  Chest 2 View 08/19/2013:   Elevation of the right hemidiaphragm. Minimal left basilar opacity likely reflects atelectasis. Mild peribronchial thickening noted.     Hip Complete Left 08/05/2013:  No acute bony or joint abnormality. Diffuse osteopenia. Left hip replacement. Left hip prosthesis in good anatomic position.     Ct Head Wo Contrast 08/19/2013:  No acute intracranial pathology seen on CT. Moderate cortical volume loss and scattered small vessel ischemic microangiopathy. Chronic lacunar infarct at the right caudate.     Admission HPI: Pt is a 63 y/o M w/ PMHx of HTN, DM, and paranoid  schizophrenia who presents from his assisted living facility at [facility] w/ acute encephalopathy. Pt was last admitted on 7/17 for a swollen left leg 2/2 to fall and d/c'd 7/23. Pt unable to be obtained from pt as he would not wake up. Per ED notes pt arrived to ED from [facility]  On our examination this morning: he was sleeping but arousable although not talking and trying to pull off his mittens. He did have pain on lifting or right and left legs and noted abrasions on b/l knees that are new. Upon taking with his ALF this morning, they report an unwitnessed fall yesterday when trying to get out of bed to use the bathroom--incident report was filed unclear if he was found on the ground. And then he also was noted to have a fall around 1am this morning, found on his knees and head on the bed. Unclear if any LOC. He was subsequently sent to the ER.   Hospital Course by problem list: Principal Problem:   Rhabdomyolysis Active Problems:   Hypertension   Schizophrenia, paranoid type   Type II or unspecified type diabetes mellitus without mention of complication, uncontrolled   Chronic anemia   Periprosthetic hip fracture--nondisplaced, Left   Hyponatremia   Acute renal failure superimposed on stage 3 chronic kidney disease   Encephalopathy   Acute encephalopathy   Multiple abrasions b/l knees   Elevated LFTs   Aspiration pneumonia   Falls frequently   63 yo man with HTN, DM and paranoid schizophrenia who presented with recent fall at ALF and was found to have presumed aspiration pneumonia in the context of acute encephalopathy along with rhabdomyolysis with acute on chronic renal failure and elevated LFTs. He had last been admitted on 7/17 for a swollen left leg 2/2 to fall and was d/c'd 7/23. At admission, he was treated with antibiotics, hydration and physical therapy, and his WBC and CK trended down; however, he soon started refusing most intervention (including pills, vital signs, IV  placement and blood draws), making care challenging as he awaited placement.   Acute encephalopathy in the setting of paranoid schizophrenia: back to baseline. Agitated at times overnight and when nursing intervened (with injections, meals, vital signs or pills). He previously was experiencing inappropriate day-night shifting, though this has  improved since last hospitalization. Home medications have been attempted, though patient pulled out his IV and only periodically took his medications PO (nurse has been hiding them in his applesauce, which he partially finishes).   Leukocytosis with presumed aspiration PNA in the setting of acute encephalopathy: as per explanation above. Remained afebrile. Leukocytosis trending down from 17.5 on admission to 12.9. Last dose of clindamycin on 7/31.  Rhabdomyolysis with acute on chronic renal failure and elevated LFTs: likely secondary to falls and decreased mobility in setting of recent hip fracture. Improved with aggressive IVF hydration. Cr trended down 3.85 --> 2.21, K stable at 4.1, and LFTs trending down during last set of labs. CK down from 4024 on admission to 998. Patient pulled out his IV and refused blood draws. However, pre-renal etiology is also evident given improvement with hydration.   Frequent falls: 2 falls since last hospitalization while back at ALF. Has resumed PT - continued recommendations for SNF this admission. Thankfully, R foot xray on this admission showed no acute fracture or dislocation, but does show flexion deformities and extensive arterial vascular calcifications. Portable R hip xray with no fracture or dislocation seen. Over the past month, he has continually returned to the hospital from his ALF and needs 24 hour care.   HTN: elevated during this admission, especially in setting of agitation (patient gets agitated when BP cuff is placed, so difficult to assess). Patient was being given his PO meds in applesauce when he is refusing,  so difficult to assess which pills are being consumed. Clonidine patch was last changed 7/26.  Discharge Vitals:   BP 199/89  Pulse 65  Temp(Src) 97.8 F (36.6 C) (Oral)  Resp 18  Ht 6' 0.05" (1.83 m)  Wt 200 lb 6.4 oz (90.901 kg)  BMI 27.14 kg/m2  SpO2 95%   Signed: Dionne Ano, MD 08/25/2013, 11:52 AM    Services Ordered on Discharge: SNF, physical therapy Equipment Ordered on Discharge: none

## 2013-08-21 NOTE — Progress Notes (Signed)
Pt noted to be agitated, irritable and restless. Charge nurse aware, Security called. Needs attended to. Bed alarm on. Refused for vital signs taking. Will continue to monitor.

## 2013-08-21 NOTE — Clinical Social Work Psychosocial (Signed)
Clinical Social Work Department BRIEF PSYCHOSOCIAL ASSESSMENT 08/21/2013  Patient:  David Lang,David Lang     Account Number:  [id number]     Admit date:  08/19/2013  Clinical Social Worker:  Delmer IslamRAWFORD,Eulice Rutledge, LCSW  Date/Time:  08/21/2013 03:54 AM  Referred by:  Physician  Date Referred:  08/21/2013 Referred for  SNF Placement   Other Referral:   Interview type:  Other - See comment Other interview type:   Attending physician - Dionne AnoJulia Mallory    PSYCHOSOCIAL DATA Living Status:  FACILITY [facility] Level of care:  Assisted Living Primary support name:  Leiloa Melson Primary support relationship to patient:  SIBLING Degree of support available:   Sisters are caring and supportive.  Ferne ReusNajwa Shammas (sister) is patient's HCPOA.    CURRENT CONCERNS Current Concerns  Post-Acute Placement   Other Concerns:    SOCIAL WORK ASSESSMENT / PLAN MD talked with family and they remain interested in SNF for patient due to his as his admission 5 days from discharge is again for a fall. CSW attempted to reach Ms. Barbie HaggisLeila Melson [phone]) and left a message.   Assessment/plan status:  Psychosocial Support/Ongoing Assessment of Needs Other assessment/ plan:   Information/referral to community resources:   CSW will follow-up wtih sister regarding resources.    PATIENT'S/FAMILY'S RESPONSE TO PLAN OF CARE: Family wants SNF for short-term rehab per MD. CSW will follow-up with sister with bed offers from SNF search.

## 2013-08-22 DIAGNOSIS — D72829 Elevated white blood cell count, unspecified: Secondary | ICD-10-CM | POA: Diagnosis not present

## 2013-08-22 DIAGNOSIS — F200 Paranoid schizophrenia: Secondary | ICD-10-CM | POA: Diagnosis not present

## 2013-08-22 DIAGNOSIS — G9340 Encephalopathy, unspecified: Secondary | ICD-10-CM | POA: Diagnosis not present

## 2013-08-22 DIAGNOSIS — M6282 Rhabdomyolysis: Secondary | ICD-10-CM | POA: Diagnosis not present

## 2013-08-22 LAB — GLUCOSE, CAPILLARY
GLUCOSE-CAPILLARY: 235 mg/dL — AB (ref 70–99)
Glucose-Capillary: 103 mg/dL — ABNORMAL HIGH (ref 70–99)
Glucose-Capillary: 162 mg/dL — ABNORMAL HIGH (ref 70–99)
Glucose-Capillary: 171 mg/dL — ABNORMAL HIGH (ref 70–99)

## 2013-08-22 NOTE — Progress Notes (Signed)
Internal Medicine Attending  Date: 08/22/2013  Patient name: David Lang Medical record number: [id number] Date of birth: 08/15/1950 Age: 63 y.o. Gender: male  I saw and evaluated the patient. I developed the assessment and plan with the housestaff and reviewed the resident's note by Dr. Leatha GildingMallory.  I agree with the resident's findings and plans as documented in her progress note.  Mental status at baseline on oral medications.  Rhabdomyolysis resolved.  Treating possible aspiration pneumonia with antibiotics, awaiting SNF placement.

## 2013-08-22 NOTE — Progress Notes (Signed)
PT Cancellation Note  Patient Details Name: David Lang MRN: [id number] DOB: 06/28/1950   Cancelled Treatment:    Reason Eval/Treat Not Completed: Other (comment) Sleeping quite soundly;  Will follow up later today as time allows;  Otherwise, will follow up for PT tomorrow;   Thank you,  Van ClinesHolly Xandra Laramee, PT  Acute Rehabilitation Services Pager [phone] Office [phone]     Van ClinesGarrigan, David Matty [facility] 08/22/2013, 10:15 AM

## 2013-08-22 NOTE — Progress Notes (Signed)
Subjective: David Lang was asleep this morning during our visit. Later in the day, he was awake and interactive, stated that he wanted to leave the hospital.   Currently has a Comptrollersitter.  Objective: Vital signs in last 24 hours: Filed Vitals:   08/21/13 1500 08/21/13 2143 08/22/13 0529 08/22/13 1117  BP: 154/100 205/98 184/92 160/83  Pulse: 82 75 71 74  Temp:  97.6 F (36.4 C) 98.1 F (36.7 C) 97.9 F (36.6 C)  TempSrc:  Oral Oral Oral  Resp: 17 19 17 18   Height:      Weight:  207 lb 0.2 oz (93.9 kg)    SpO2: 100% 95% 95% 95%   Weight change:   Intake/Output Summary (Last 24 hours) at 08/22/13 1332 Last data filed at 08/22/13 1000  Gross per 24 hour  Intake    460 ml  Output    350 ml  Net    110 ml   Vitals reviewed. General: lying in bed without glasses, eyes closed, resting, breakfast untouched on tray and sitter at bedside HEENT: EOMI Cardiac/pulm/abd: refusing exam Ext: moving all 4 extremities, foam dressings in place on right knee, abrasions evident on left knee, non edematous, not weeping Neuro: responsive, interactive, has been  GU: foley in place  Lab Results: Basic Metabolic Panel:  Recent Labs Lab 08/19/13 0405 08/20/13 0535 08/21/13 0433  NA 136* 143 139  K 5.3 4.5 4.1  CL 93* 102 99  CO2 24 21 25   GLUCOSE 169* 130* 175*  BUN 53* 46* 35*  CREATININE 3.85* 2.93* 2.21*  CALCIUM 9.3 8.6 8.3*  PHOS 4.7*  --   --    Liver Function Tests:  Recent Labs Lab 08/19/13 0405 08/20/13 0535  AST 59* 40*  ALT 26 28  ALKPHOS 119* 110  BILITOT 0.4 0.3  PROT 6.9 6.3  ALBUMIN 3.0* 2.6*   CBC:  Recent Labs Lab 08/19/13 0530 08/20/13 0535  WBC 17.5* 12.9*  NEUTROABS 14.3*  --   HGB 11.3* 10.9*  HCT 34.4* 34.3*  MCV 89.4 91.0  PLT 475* 477*   Cardiac Enzymes:  Recent Labs Lab 08/19/13 0405 08/20/13 0535 08/21/13 0433  CKTOTAL 4024* 2279* 998*  TROPONINI <0.30  --   --    CBG:  Recent Labs Lab 08/21/13 0746 08/21/13 1149  08/21/13 1654 08/21/13 2140 08/22/13 0809 08/22/13 1155  GLUCAP 147* 147* 215* 130* 103* 162*   Urine Drug Screen: Drugs of Abuse     Component Value Date/Time   LABOPIA POSITIVE* 08/11/2013 1150   COCAINSCRNUR NONE DETECTED 08/11/2013 1150   LABBENZ NONE DETECTED 08/11/2013 1150   AMPHETMU NONE DETECTED 08/11/2013 1150   THCU NONE DETECTED 08/11/2013 1150   LABBARB NONE DETECTED 08/11/2013 1150    Urinalysis:  Recent Labs Lab 08/19/13 0402  COLORURINE YELLOW  LABSPEC 1.020  PHURINE 5.5  GLUCOSEU 250*  HGBUR TRACE*  BILIRUBINUR SMALL*  KETONESUR NEGATIVE  PROTEINUR >300*  UROBILINOGEN 0.2  NITRITE NEGATIVE  LEUKOCYTESUR NEGATIVE   Micro Results: Recent Results (from the past 240 hour(s))  URINE CULTURE     Status: None   Collection Time    08/19/13  4:02 AM      Result Value Ref Range Status   Specimen Description URINE, CATHETERIZED   Final   Special Requests NONE   Final   Culture  Setup Time     Final   Value: 08/19/2013 15:50     Performed at [facility]   [place] Count  Final   Value: NO GROWTH     Performed at [facility]   Culture     Final   Value: NO GROWTH     Performed at [facility]   Report Status 08/20/2013 FINAL   Final   Studies/Results: No results found. Medications: I have reviewed the patient's current medications. Scheduled Meds: . antiseptic oral rinse  15 mL Mouth Rinse q12n4p  . aspirin EC  81 mg Oral Daily  . atenolol  100 mg Oral Daily  . chlorhexidine  15 mL Mouth Rinse BID  . clindamycin  450 mg Oral 3 times per day  . clonazePAM  0.25 mg Oral BID  . cloNIDine  0.3 mg Transdermal Weekly  . enoxaparin (LOVENOX) injection  40 mg Subcutaneous Q24H  . feeding supplement (ENSURE COMPLETE)  237 mL Oral BID BM  . haloperidol  15 mg Oral BID  . hydrALAZINE  50 mg Oral TID  . insulin aspart  0-9 Units Subcutaneous TID WC  . lisinopril  10 mg Oral Daily  . morphine  15 mg Oral Q12H  . OXcarbazepine  450 mg  Oral BID  . pantoprazole  40 mg Oral Daily  . tamsulosin  0.4 mg Oral Daily  . trihexyphenidyl  2.5 mg Oral q morning - 10a  . trihexyphenidyl  5 mg Oral QHS  . vitamin B-12  1,000 mcg Oral Daily   Continuous Infusions:   PRN Meds:.HYDROcodone-acetaminophen, ondansetron, polyethylene glycol Assessment/Plan: Principal Problem:   Rhabdomyolysis Active Problems:   Hypertension   Schizophrenia, paranoid type   Type II or unspecified type diabetes mellitus without mention of complication, uncontrolled   Chronic anemia   Periprosthetic hip fracture--nondisplaced, Left   Hyponatremia   Acute renal failure superimposed on stage 3 chronic kidney disease   Encephalopathy   Acute encephalopathy   Multiple abrasions b/l knees   Elevated LFTs   Aspiration pneumonia   Falls frequently  63 yo man with HTN, DM and paranoid schizophrenia who presented with acute encephalopathy. Pt was last admitted on 7/17 for a swollen left leg 2/2 to fall and d/c'd 7/23. Since admission, his WBC and CK have trended down.  Acute encephalopathy in the setting of paranoid schizophrenia--back to baseline. Agitated at times overnight and when nursing is intervening (with injections, meals, vital signs or pills). He has been experiencing inappropriate day-night shifting, though this has improved since last hospitalization. Concern for aspiration was initially high, but SLP approved regular, thin liquid diet that he can self feed. Home medication have been attempted, though patient pulled out his IV and is only periodically taking them PO (nurse has been hiding them in his applesauce, which he partially finishes).   Leukocytosis with presumed aspiration PNA in the setting of acute encephalopathy--as per explanation above. Remains afebrile. Leukocytosis trending down from 17.5 on admission to 12.9. Less suspicious of UTI.  - Transitioned yesterday to PO clindamycin 450mg  TID for total 7 days as it is in capsule form and  likely easier to swallow than augmentin. Today is day 4/7 - If febrile, will order blood cultures - Urine culture NGTD   Rhabdomyolysis with acute on chronic renal failure and elevated LFTs--likely secondary to falls and decreased mobility in setting of recent hip fracture.  Improving with aggressive IVF hydration. Cr down from 3.85 to 2.21 this morning, K stable at 4.1, and LFTs trending down during last set of labs. CK down from 4024 on admission to 998 yesterday. Given his agitation and  baseline mental status, difficult to keep IV in place; the patient pulled out his IV. However, pre-renal etiology is also evident given improvement with hydration. - Continue IVF if possible; IV currently pulled out - Encouraging fluid intake as much as he allows - No longer holding ACEi as etiology is pre-renal - Trend renal function if patient allows blood draws  Frequent falls-- 2 falls since last week's hospitalization while back at ALF. Has resumed PT - continued recommendations for SNF this admission. Thankfully, R foot xray on this admission showed no acute fracture or dislocation, but does show flexion deformities and extensive arterial vascular calcifications. Portable R hip xray with no fracture or dislocation seen. Over the past month, he has continually returned to the hospital from his ALF and needs 24 hour care. - PT (session skipped today in context of patient sleeping) - Dispo pending hopeful SNF admission  HTN--elevated periodically during this admission in setting of agitation (patient gets agitated when BP cuff is placed, so difficult to assess). Patient is only taking some of his PO meds (nurse has been putting them in his applesauce), so difficult to assess which pills are being consumed. - Continue home medications including amlodipine, atenolol, hydralazine - Continue with clonidine patch - Resume lisinopril  DM2 - Monitor cbg as patient allows - SSI sensitive  Diet: Carb mod diet -  SLP recommends regular, thin liquids with patient able to feed self - Registered dietician has suggested Ensure Complete again, though patient is refusing it (does not trust it)  DVT PPx: switched from heparin to lovenox (fewer injections)  Dispo: Disposition is deferred at this time, awaiting SNF placement. SW has PASSAR #, awaiting return phone call from family to determine SNF of choice.  The patient does have a current PCP (Provider Default, MD) and will have hospital follow-up appointment after discharge at SNF.  The patient does not have transportation limitations that hinder transportation to clinic appointments.  Services Needed at time of discharge: Y = Yes, Blank = No PT: SNF if possible and approved by insurance  OT:   RN:   Equipment:   Other:     LOS: 3 days   Dionne Ano, MD 08/22/2013, 1:32 PM

## 2013-08-23 DIAGNOSIS — G9340 Encephalopathy, unspecified: Secondary | ICD-10-CM | POA: Diagnosis not present

## 2013-08-23 DIAGNOSIS — D72829 Elevated white blood cell count, unspecified: Secondary | ICD-10-CM | POA: Diagnosis not present

## 2013-08-23 DIAGNOSIS — M6282 Rhabdomyolysis: Secondary | ICD-10-CM | POA: Diagnosis not present

## 2013-08-23 DIAGNOSIS — F200 Paranoid schizophrenia: Secondary | ICD-10-CM | POA: Diagnosis not present

## 2013-08-23 LAB — GLUCOSE, CAPILLARY
GLUCOSE-CAPILLARY: 116 mg/dL — AB (ref 70–99)
GLUCOSE-CAPILLARY: 166 mg/dL — AB (ref 70–99)
GLUCOSE-CAPILLARY: 158 mg/dL — AB (ref 70–99)
Glucose-Capillary: 150 mg/dL — ABNORMAL HIGH (ref 70–99)

## 2013-08-23 MED ORDER — AMLODIPINE BESYLATE 10 MG PO TABS
10.0000 mg | ORAL_TABLET | Freq: Every day | ORAL | Status: DC
  Administered 2013-08-23 – 2013-08-25 (×3): 10 mg via ORAL
  Filled 2013-08-23 (×4): qty 1

## 2013-08-23 NOTE — Progress Notes (Signed)
Paged teaching service, Lovett CalenderFYI Mallory stated would be up to see him in about 1 hr.

## 2013-08-23 NOTE — Progress Notes (Signed)
PT working with patient.

## 2013-08-23 NOTE — Progress Notes (Signed)
Pt aggressive hitting, slapping medication out of hands.  Pt refusing care. Pt pulled condom cath off.

## 2013-08-23 NOTE — Progress Notes (Signed)
UR Completed Ladon Vandenberghe Graves-Bigelow, RN,BSN [phone]  

## 2013-08-23 NOTE — Progress Notes (Signed)
Subjective: [name] was very pleasant during our encounter this morning, though he was trying to take his blankets off throughout. He would like to leave the hospital.  Had an episode overnight in which nursing called night team; requested haldol; night team found patient to be calm, and no haldol was given.   It is evident that patient is calm unless intervention is attempted, which is a barrier to his care in the hospital.  Currently has a Comptroller.  Objective: Vital signs in last 24 hours: Filed Vitals:   08/22/13 2057 08/22/13 2203 08/22/13 2304 08/23/13 0441  BP: 210/115 179/82 154/74 191/89  Pulse: 79   77  Temp: 98.2 F (36.8 C)   97.9 F (36.6 C)  TempSrc: Oral   Oral  Resp: 16   18  Height:      Weight: 201 lb (91.173 kg)     SpO2: 95%   97%   Weight change: -6 lb 0.2 oz (-2.727 kg)  Intake/Output Summary (Last 24 hours) at 08/23/13 0839 Last data filed at 08/23/13 0447  Gross per 24 hour  Intake      0 ml  Output   1900 ml  Net  -1900 ml   Vitals reviewed. BP has been running high General: lying in bed without glasses HEENT: EOMI Cardiac/pulm/abd: refusing exam Ext: moving all 4 extremities, foam dressings in place on both knees, nontender, non edematous, C/D/I. Still no IV. Neuro: responsive, interactive, has been  GU: condom cath has been pulled out  Lab Results: Basic Metabolic Panel:  Recent Labs Lab 08/19/13 0405 08/20/13 0535 08/21/13 0433  NA 136* 143 139  K 5.3 4.5 4.1  CL 93* 102 99  CO2 24 21 25   GLUCOSE 169* 130* 175*  BUN 53* 46* 35*  CREATININE 3.85* 2.93* 2.21*  CALCIUM 9.3 8.6 8.3*  PHOS 4.7*  --   --    Liver Function Tests:  Recent Labs Lab 08/19/13 0405 08/20/13 0535  AST 59* 40*  ALT 26 28  ALKPHOS 119* 110  BILITOT 0.4 0.3  PROT 6.9 6.3  ALBUMIN 3.0* 2.6*   CBC:  Recent Labs Lab 08/19/13 0530 08/20/13 0535  WBC 17.5* 12.9*  NEUTROABS 14.3*  --   HGB 11.3* 10.9*  HCT 34.4* 34.3*  MCV 89.4 91.0  PLT  475* 477*   Cardiac Enzymes:  Recent Labs Lab 08/19/13 0405 08/20/13 0535 08/21/13 0433  CKTOTAL 4024* 2279* 998*  TROPONINI <0.30  --   --    CBG:  Recent Labs Lab 08/21/13 2140 08/22/13 0809 08/22/13 1155 08/22/13 1646 08/22/13 2058 08/23/13 0804  GLUCAP 130* 103* 162* 171* 235* 116*   Urine Drug Screen: Drugs of Abuse     Component Value Date/Time   LABOPIA POSITIVE* 08/11/2013 1150   COCAINSCRNUR NONE DETECTED 08/11/2013 1150   LABBENZ NONE DETECTED 08/11/2013 1150   AMPHETMU NONE DETECTED 08/11/2013 1150   THCU NONE DETECTED 08/11/2013 1150   LABBARB NONE DETECTED 08/11/2013 1150    Urinalysis:  Recent Labs Lab 08/19/13 0402  COLORURINE YELLOW  LABSPEC 1.020  PHURINE 5.5  GLUCOSEU 250*  HGBUR TRACE*  BILIRUBINUR SMALL*  KETONESUR NEGATIVE  PROTEINUR >300*  UROBILINOGEN 0.2  NITRITE NEGATIVE  LEUKOCYTESUR NEGATIVE   Micro Results: Recent Results (from the past 240 hour(s))  URINE CULTURE     Status: None   Collection Time    08/19/13  4:02 AM      Result Value Ref Range Status   Specimen Description  URINE, CATHETERIZED   Final   Special Requests NONE   Final   Culture  Setup Time     Final   Value: 08/19/2013 15:50     Performed at [facility] Count     Final   Value: NO GROWTH     Performed at [facility]   Culture     Final   Value: NO GROWTH     Performed at [facility]   Report Status 08/20/2013 FINAL   Final   Studies/Results: No results found. Medications: I have reviewed the patient's current medications. Scheduled Meds: . aspirin EC  81 mg Oral Daily  . atenolol  100 mg Oral Daily  . chlorhexidine  15 mL Mouth Rinse BID  . clindamycin  450 mg Oral 3 times per day  . clonazePAM  0.25 mg Oral BID  . cloNIDine  0.3 mg Transdermal Weekly  . enoxaparin (LOVENOX) injection  40 mg Subcutaneous Q24H  . feeding supplement (ENSURE COMPLETE)  237 mL Oral BID BM  . haloperidol  15 mg Oral BID  .  hydrALAZINE  50 mg Oral TID  . insulin aspart  0-9 Units Subcutaneous TID WC  . lisinopril  10 mg Oral Daily  . morphine  15 mg Oral Q12H  . OXcarbazepine  450 mg Oral BID  . pantoprazole  40 mg Oral Daily  . tamsulosin  0.4 mg Oral Daily  . trihexyphenidyl  2.5 mg Oral q morning - 10a  . trihexyphenidyl  5 mg Oral QHS  . vitamin B-12  1,000 mcg Oral Daily   Continuous Infusions:   PRN Meds:.HYDROcodone-acetaminophen, ondansetron, polyethylene glycol Assessment/Plan: Principal Problem:   Rhabdomyolysis Active Problems:   Hypertension   Schizophrenia, paranoid type   Type II or unspecified type diabetes mellitus without mention of complication, uncontrolled   Chronic anemia   Periprosthetic hip fracture--nondisplaced, Left   Hyponatremia   Acute renal failure superimposed on stage 3 chronic kidney disease   Encephalopathy   Acute encephalopathy   Multiple abrasions b/l knees   Elevated LFTs   Aspiration pneumonia   Falls frequently  63 yo man with HTN, DM and paranoid schizophrenia who presented with acute encephalopathy. Pt was last admitted on 7/17 for a swollen left leg 2/2 to fall and d/c'd 7/23. Since admission, his WBC and CK have trended down; however, he has been refusing intervention (including pills, vital signs, IV placement and blood draws), making care challenging as he awaits placement.  Acute encephalopathy in the setting of paranoid schizophrenia--back to baseline. Agitated at times overnight and when nursing is intervening (with injections, meals, vital signs or pills). He experienced inappropriate day-night shifting, though this has improved since last hospitalization. Home medications have been attempted, though patient pulled out his IV and is only periodically taking medications PO (nurse has been hiding them in his applesauce, which he partially finishes). Took all PO medications last night and this morning.  Leukocytosis with presumed aspiration PNA in the  setting of acute encephalopathy--as per explanation above. Remains afebrile. Leukocytosis trending down from 17.5 on admission to 12.9. Less suspicious of UTI.  - Transitioned to PO clindamycin 450mg  TID for total 7 days as it is in capsule form and likely easier to swallow than augmentin. Today is day 5/7 - If febrile, will order blood cultures - Urine culture NGTD   Rhabdomyolysis with acute on chronic renal failure and elevated LFTs--likely secondary to falls and decreased mobility in setting  of recent hip fracture.  Improving with aggressive IVF hydration. Cr trended down 3.85 --> 2.21, K stable at 4.1, and LFTs trending down during last set of labs. CK down from 4024 on admission to 998. Patient pulled out his IV. However, pre-renal etiology is also evident given improvement with hydration. - Encouraging fluid intake as much as he allows - Trend renal function if patient allows blood draws  Frequent falls-- 2 falls since last week's hospitalization while back at ALF. Has resumed PT - continued recommendations for SNF this admission. Thankfully, R foot xray on this admission showed no acute fracture or dislocation, but does show flexion deformities and extensive arterial vascular calcifications. Portable R hip xray with no fracture or dislocation seen. Over the past month, he has continually returned to the hospital from his ALF and needs 24 hour care. - PT (session skipped yesterday due to patient sleeping); to resume today - Dispo pending hopeful insurance acceptance of SNF  HTN--elevated during this admission, especially in setting of agitation (patient gets agitated when BP cuff is placed, so difficult to assess). Patient is only taking some of his PO meds, so difficult to assess which pills are being consumed. - Continue home medications including clonidine (0.3 mg patch), atenolol (100mg ), hydralazine (50mg ), lisinopril (10mg ) - not on home amlodipine (10mg ); consider reordering - There is  room to move up on hydralazine and lisinopril - Continue with clonidine patch (changed 7/26)  DM2 - Monitor cbg as patient allows - SSI sensitive  Diet: Carb mod diet - SLP recommends regular, thin liquids with patient able to feed self - Registered dietician has suggested Ensure Complete again, though patient is refusing it (does not trust it)  DVT PPx: switched from heparin to lovenox (fewer injections)  Dispo: Disposition is deferred at this time, awaiting SNF placement. SW has PASSAR #, [facility]; paperwork submitted to insurance; awaiting insurance clearance.  The patient does have a current PCP (Provider Default, MD) and will have hospital follow-up appointment after discharge at SNF.  The patient does not have transportation limitations that hinder transportation to clinic appointments.  Services Needed at time of discharge: Y = Yes, Blank = No PT: SNF if possible and approved by insurance  OT:   RN:   Equipment:   Other:     LOS: 4 days   Dionne AnoJulia Tavis Kring, MD 08/23/2013, 8:39 AM

## 2013-08-23 NOTE — Progress Notes (Signed)
Physical Therapy Treatment Patient Details Name: David Lang MRN: [id number] DOB: 03/13/1950 Today's Date: 08/23/2013    History of Present Illness Admitted with Falls in the setting of L Nondisplaced periprosthetic proximal femur fracture; Acute encephalopathy w/ unclear etiology; Rhabdomyolysis with anion gap metabolic acidosis and elevated LFTs; Acute on chronic renal failure in setting of CKD3; Schizophrenia; 2 recent hospital admissions    PT Comments    Pt willing to get up initially OOB, then clearly wanting back to bed; Continued concern for pt putting too much weight on LLE, so will limit mobility to transfers only; Minimal participation in therex  Follow Up Recommendations  SNF;Supervision/Assistance - 24 hour     Equipment Recommendations  Wheelchair (measurements PT);Wheelchair cushion (measurements PT);Rolling walker with 5" wheels;3in1 (PT)    Recommendations for Other Services       Precautions / Restrictions Precautions Precautions: Fall Restrictions LLE Weight Bearing: Non weight bearing    Mobility  Bed Mobility Overal bed mobility: Needs Assistance;+2 for physical assistance Bed Mobility: Sit to Supine     Supine to sit: +2 for physical assistance;Mod assist Sit to supine: +2 for physical assistance;Max assist   General bed mobility comments: Cues for technique, and eventually required hand over hand guidance and lifting bil LEs to lay back down  Transfers Overall transfer level: Needs assistance Equipment used: 2 person hand held assist Transfers: Stand Pivot Transfers Sit to Stand: Max assist;+2 physical assistance Stand pivot transfers: +2 physical assistance;Max assist       General transfer comment: Basic transfer OOB to chair on pt's Right side; Continued concern that pt is unable to keep NWB on LLE during transfers, but keeping it to basic transfers to limit WBing as much as possible; once up in chair, pt was making a clear effort to get back  in bed, to the point of being unsafe, so assisted pt back to bed with the help of his sitter  Ambulation/Gait                 Stairs            Wheelchair Mobility    Modified Rankin (Stroke Patients Only)       Balance     Sitting balance-Leahy Scale: Fair       Standing balance-Leahy Scale: Poor                      Cognition Arousal/Alertness: Awake/alert Behavior During Therapy: Flat affect Overall Cognitive Status: History of cognitive impairments - at baseline       Memory: Decreased recall of precautions;Decreased short-term memory              Exercises General Exercises - Lower Extremity Heel Slides: AROM;AAROM;Left;5 reps Hip ABduction/ADduction:  (attempted, but pt holding LLE against moving)    General Comments        Pertinent Vitals/Pain Grimace and verbalized there is pain L hip when getting up; Reinforced that is why we are trying to only put weight on R LE when getitng up;  patient repositioned for comfort     Home Living                      Prior Function            PT Goals (current goals can now be found in the care plan section) Acute Rehab PT Goals Patient Stated Goal: Did not state PT Goal Formulation: Patient unable to participate in  goal setting Time For Goal Achievement: 09/03/13 Potential to Achieve Goals: Fair Progress towards PT goals: Progressing toward goals    Frequency  Min 3X/week    PT Plan Current plan remains appropriate    Co-evaluation             End of Session Equipment Utilized During Treatment: Gait belt Activity Tolerance: Patient limited by pain Patient left: in bed;with call bell/phone within reach;with nursing/sitter in room     Time: 1438-1501 PT Time Calculation (min): 23 min  Charges:  $Therapeutic Activity: 23-37 mins                    G Codes:      [name] 08/23/2013, 4:58 PM  Van Clines, [place]  Acute Rehabilitation  Services Pager [phone] Office [phone]

## 2013-08-23 NOTE — Progress Notes (Signed)
Internal Medicine Attending  Date: 08/23/2013  Patient name: David Lang Medical record number: [id number] Date of birth: 12/10/1950 Age: 63 y.o. Gender: male  I saw and evaluated the patient. I developed the assessment and plan with the housestaff and reviewed the resident's note by Dr. Leatha GildingMallory.  I agree with the resident's findings and plans as documented in her progress note.  David Lang was resting comfortably when seen on rounds this AM.  His paranoid schizophrenia has continued to pose challenges for all members of the healthcare team trying to provide care.  Fortunately, all of his issues are now either subacute (periprosthetic hip fracture) or chronic (paranoid schizophrenia and HTN).  He is in need of skilled nursing services to help manage these non-acute issues in a stable and eventually familiar environment given his significant psychiatric disease.  We continue to work toward this need of David Lang.

## 2013-08-23 NOTE — Care Management Note (Signed)
    Page 1 of 1   08/23/2013     4:14:37 PM CARE MANAGEMENT NOTE 08/23/2013  Patient:  Uphoff,Nayden   Account Number:  [id number]  Date Initiated:  08/23/2013  Documentation initiated by:  GRAVES-BIGELOW,Trishia Cuthrell  Subjective/Objective Assessment:   Pt admitted for  Acute encephalopathy, falls. [facility].     Action/Plan:   CSW to assist with disposition needs.   Anticipated DC Date:  08/24/2013   Anticipated DC Plan:  SKILLED NURSING FACILITY  In-house referral  Clinical Social Worker      DC Planning Services  CM consult      Choice offered to / List presented to:             Status of service:  Completed, signed off Medicare Important Message given?  NO (If response is "NO", the following Medicare IM given date fields will be blank) Date Medicare IM given:   Medicare IM given by:   Date Additional Medicare IM given:   Additional Medicare IM given by:    Discharge Disposition:  SKILLED NURSING FACILITY  Per UR Regulation:  Reviewed for med. necessity/level of care/duration of stay  If discussed at Long Length of Stay Meetings, dates discussed:   08/24/2013    Comments:

## 2013-08-24 DIAGNOSIS — M6282 Rhabdomyolysis: Secondary | ICD-10-CM | POA: Diagnosis not present

## 2013-08-24 DIAGNOSIS — G9340 Encephalopathy, unspecified: Secondary | ICD-10-CM | POA: Diagnosis not present

## 2013-08-24 DIAGNOSIS — D72829 Elevated white blood cell count, unspecified: Secondary | ICD-10-CM | POA: Diagnosis not present

## 2013-08-24 DIAGNOSIS — F200 Paranoid schizophrenia: Secondary | ICD-10-CM | POA: Diagnosis not present

## 2013-08-24 LAB — GLUCOSE, CAPILLARY
GLUCOSE-CAPILLARY: 142 mg/dL — AB (ref 70–99)
GLUCOSE-CAPILLARY: 211 mg/dL — AB (ref 70–99)
Glucose-Capillary: 104 mg/dL — ABNORMAL HIGH (ref 70–99)
Glucose-Capillary: 146 mg/dL — ABNORMAL HIGH (ref 70–99)

## 2013-08-24 NOTE — Discharge Instructions (Signed)
Please continue to participate in physical therapy ; it will be non-weight bearing until you are reassessed by your orthopaedic surgeon, Dr. Ave Filterhandler.  Continue to drink plenty of fluids.  Rhabdomyolysis Rhabdomyolysis is the breakdown of muscle fibers due to injury. The injury may come from physical damage to the muscle like an injury but other causes are:  High fever (hyperthermia).  Seizures (convulsions).  Low phosphate levels.  Diseases of metabolism.  Heatstroke.  Drug toxicity.  Over exertion.  Alcoholism.  Muscle is cut off from oxygen (anoxia).  The squeezing of nerves and blood vessels (compartment syndrome). Some drugs which may cause the breakdown of muscle are:  Antibiotics.  Statins.  Alcohol.  Animal toxins. Myoglobin is a substance which helps muscle use oxygen. When the muscle is damaged, the myoglobin is released into the bloodstream. It is filtered out of the bloodstream by the kidneys. Myoglobin may block up the kidneys. This may cause damage, such as kidney failure. It also breaks down into other damaging toxic parts, which also cause kidney failure.  SYMPTOMS   Dark, red, or tea colored urine.  Weakness of affected muscles.  Weight gain from water retention.  Joint aches and pains.  Irregular heart from high potassium in the blood.  Muscle tenderness or aching.  Generalized weakness.  Seizures.  Feeling tired (fatigue). DIAGNOSIS  Your caregiver may find muscle tenderness on exam and suspect the problem. Urine tests and blood work can confirm the problem. TREATMENT   Early and aggressive treatment with large amounts of fluids may help prevent kidney failure.  Water producing medicine (diuretic) may be used to help flush the kidneys.  High potassium and calcium problems (electrolyte) in your blood may need treatment. HOME CARE INSTRUCTIONS  This problem is usually cared for in a hospital. If you are allowed to go home and  require dialysis, make sure you keep all appointments for lab work and dialysis. Not doing so could result in death. Document Released: 12/26/2003 Document Revised: 04/06/2011 Document Reviewed: 07/09/2008 [facility] Patient Information 2015 [place] [employer] [place]. This information is not intended to replace advice given to you by your health care provider. Make sure you discuss any questions you have with your health care provider.

## 2013-08-24 NOTE — Progress Notes (Signed)
Internal Medicine Attending  Date: 08/24/2013  Patient name: David CarmineFuad Gancarz Medical record number: [id number] Date of birth: 02/12/1950 Age: 63 y.o. Gender: male  I saw and evaluated the patient. I developed the assessment and plan with the housestaff and reviewed the resident's note by Dr. Leatha GildingMallory.  I agree with the resident's findings and plans as documented in her progress note.

## 2013-08-24 NOTE — Progress Notes (Signed)
Subjective: [name] was interacting with nurses this morning during our visit and was later pleasant in conversation.  No events overnight, but it has become evident that patient is calm unless intervention is attempted; this has been a major barrier to his care in the hospital.  Currently has a sitter.  Objective: Vital signs in last 24 hours: Filed Vitals:   08/23/13 1700 08/23/13 2108 08/24/13 0640 08/24/13 0952  BP: 148/74 192/96 193/88 183/75  Pulse: 61 71 72 66  Temp: 97.6 F (36.4 C) 98.2 F (36.8 C) 97.6 F (36.4 C) 97.7 F (36.5 C)  TempSrc: Oral Oral Axillary Oral  Resp: 18 16 18 19   Height:      Weight:  200 lb 6.4 oz (90.901 kg)    SpO2: 94% 93% 99% 98%   Weight change: -9.6 oz (-0.272 kg)  Intake/Output Summary (Last 24 hours) at 08/24/13 1028 Last data filed at 08/24/13 0900  Gross per 24 hour  Intake      0 ml  Output    725 ml  Net   -725 ml   Vitals reviewed. BP has ranged from 148/74 to 196/91 General: lying in bed without glasses, interacting with nurses and sitter HEENT: EOMI Cardiac/pulm/abd: refusing exam Ext: moving all 4 extremities, foam dressings in place on both knees, nontender, non edematous, C/D/I. Still no IV. Neuro: responsive, interactive GU: condom cath has been replaced  Lab Results: Basic Metabolic Panel:  Recent Labs Lab 08/19/13 0405 08/20/13 0535 08/21/13 0433  NA 136* 143 139  K 5.3 4.5 4.1  CL 93* 102 99  CO2 24 21 25   GLUCOSE 169* 130* 175*  BUN 53* 46* 35*  CREATININE 3.85* 2.93* 2.21*  CALCIUM 9.3 8.6 8.3*  PHOS 4.7*  --   --    Liver Function Tests:  Recent Labs Lab 08/19/13 0405 08/20/13 0535  AST 59* 40*  ALT 26 28  ALKPHOS 119* 110  BILITOT 0.4 0.3  PROT 6.9 6.3  ALBUMIN 3.0* 2.6*   CBC:  Recent Labs Lab 08/19/13 0530 08/20/13 0535  WBC 17.5* 12.9*  NEUTROABS 14.3*  --   HGB 11.3* 10.9*  HCT 34.4* 34.3*  MCV 89.4 91.0  PLT 475* 477*   Cardiac Enzymes:  Recent Labs Lab  08/19/13 0405 08/20/13 0535 08/21/13 0433  CKTOTAL 4024* 2279* 998*  TROPONINI <0.30  --   --    CBG:  Recent Labs Lab 08/22/13 2058 08/23/13 0804 08/23/13 1129 08/23/13 1700 08/23/13 2105 08/24/13 0758  GLUCAP 235* 116* 150* 166* 158* 104*   Urine Drug Screen: Drugs of Abuse     Component Value Date/Time   LABOPIA POSITIVE* 08/11/2013 1150   COCAINSCRNUR NONE DETECTED 08/11/2013 1150   LABBENZ NONE DETECTED 08/11/2013 1150   AMPHETMU NONE DETECTED 08/11/2013 1150   THCU NONE DETECTED 08/11/2013 1150   LABBARB NONE DETECTED 08/11/2013 1150    Urinalysis:  Recent Labs Lab 08/19/13 0402  COLORURINE YELLOW  LABSPEC 1.020  PHURINE 5.5  GLUCOSEU 250*  HGBUR TRACE*  BILIRUBINUR SMALL*  KETONESUR NEGATIVE  PROTEINUR >300*  UROBILINOGEN 0.2  NITRITE NEGATIVE  LEUKOCYTESUR NEGATIVE   Micro Results: Recent Results (from the past 240 hour(s))  URINE CULTURE     Status: None   Collection Time    08/19/13  4:02 AM      Result Value Ref Range Status   Specimen Description URINE, CATHETERIZED   Final   Special Requests NONE   Final   Culture  Setup  Time     Final   Value: 08/19/2013 15:50     Performed at [facility]   Colony Count     Final   Value: NO GROWTH     Performed at [facility]   Culture     Final   Value: NO GROWTH     Performed at [facility]   Report Status 08/20/2013 FINAL   Final   Studies/Results: No results found. Medications: I have reviewed the patient's current medications. Scheduled Meds: . amLODipine  10 mg Oral Daily  . aspirin EC  81 mg Oral Daily  . atenolol  100 mg Oral Daily  . chlorhexidine  15 mL Mouth Rinse BID  . clindamycin  450 mg Oral 3 times per day  . clonazePAM  0.25 mg Oral BID  . cloNIDine  0.3 mg Transdermal Weekly  . enoxaparin (LOVENOX) injection  40 mg Subcutaneous Q24H  . feeding supplement (ENSURE COMPLETE)  237 mL Oral BID BM  . haloperidol  15 mg Oral BID  . hydrALAZINE  50 mg Oral  TID  . insulin aspart  0-9 Units Subcutaneous TID WC  . lisinopril  10 mg Oral Daily  . morphine  15 mg Oral Q12H  . OXcarbazepine  450 mg Oral BID  . pantoprazole  40 mg Oral Daily  . tamsulosin  0.4 mg Oral Daily  . trihexyphenidyl  2.5 mg Oral q morning - 10a  . trihexyphenidyl  5 mg Oral QHS  . vitamin B-12  1,000 mcg Oral Daily   Continuous Infusions:   PRN Meds:.HYDROcodone-acetaminophen, ondansetron, polyethylene glycol Assessment/Plan: Principal Problem:   Rhabdomyolysis Active Problems:   Hypertension   Schizophrenia, paranoid type   Type II or unspecified type diabetes mellitus without mention of complication, uncontrolled   Chronic anemia   Periprosthetic hip fracture--nondisplaced, Left   Hyponatremia   Acute renal failure superimposed on stage 3 chronic kidney disease   Encephalopathy   Acute encephalopathy   Multiple abrasions b/l knees   Elevated LFTs   Aspiration pneumonia   Falls frequently  63 yo man with HTN, DM and paranoid schizophrenia who presented with acute encephalopathy. Pt was last admitted on 7/17 for a swollen left leg 2/2 to fall and d/c'd 7/23. Since admission, his WBC and CK have trended down; however, he has been refusing intervention (including pills, vital signs, IV placement, condom cath placement and blood draws), making care challenging as he awaits placement.  Acute encephalopathy in the setting of paranoid schizophrenia:  back to baseline. Agitated at times overnight and when nursing is intervening (with injections, meals, vital signs or pills). He previously was experiencing inappropriate day-night shifting, though this has improved since last hospitalization. Home medications have been attempted, though patient pulled out his IV and is only periodically taking medications PO (nurse has been hiding them in his applesauce, which he partially finishes).   Leukocytosis with presumed aspiration PNA in the setting of acute encephalopathy: as  per explanation above. Remains afebrile. Leukocytosis trending down from 17.5 on admission to 12.9. Less suspicious of UTI.  - Transitioned to PO clindamycin 450mg  TID for total 7 days as it is in capsule form and likely easier to swallow than augmentin. Today is day 6/7 - If febrile, will order blood cultures - Urine culture NGTD   Rhabdomyolysis with acute on chronic renal failure and elevated LFTs: likely secondary to falls and decreased mobility in setting of recent hip fracture. Improving with aggressive IVF hydration.  Cr trended down 3.85 --> 2.21, K stable at 4.1, and LFTs trending down during last set of labs. CK down from 4024 on admission to 998. Patient pulled out his IV. However, pre-renal etiology is also evident given improvement with hydration. - Encouraging fluid intake (as much as he allows) - Trend renal function (if patient allows blood draws)  Frequent falls: 2 falls since last week's hospitalization while back at ALF. Has resumed PT - continued recommendations for SNF this admission. Thankfully, R foot xray on this admission showed no acute fracture or dislocation, but does show flexion deformities and extensive arterial vascular calcifications. Portable R hip xray with no fracture or dislocation seen. Over the past month, he has continually returned to the hospital from his ALF and needs 24 hour care. - PT (completed this morning) x3/week - Dispo pending hopeful insurance acceptance of SNF  HTN: elevated during this admission, especially in setting of agitation (patient gets agitated when BP cuff is placed, so difficult to assess). Patient is being given his PO meds in applesauce when he is refusing, so difficult to assess which pills are being consumed. - Continue home medications including clonidine (0.3 mg patch, last changed 7/26), atenolol (100mg ), hydralazine (50mg ), lisinopril (10mg ) and amlodipine (10mg )  DM2 - Monitor cbg as patient allows - SSI sensitive  Diet:  Carb mod diet; patient is refusing many meals, but will eat food brought in by his outpatient caregiver - SLP recommends regular, thin liquids with patient able to feed self - Registered dietician has suggested Ensure Complete again, though patient is refusing it (does not trust it)  DVT PPx: switched from heparin to lovenox (fewer injections)  Dispo: Disposition is deferred at this time, awaiting SNF placement. SW has PASSAR #, family has decided on [facility] SN[facility]; paperwork submitted to insurance; continuing to await insurance clearance.  The patient does have a current PCP (Provider Default, MD) and will have hospital follow-up appointment after discharge at SNF.  The patient does not have transportation limitations that hinder transportation to clinic appointments.  Services Needed at time of discharge: Y = Yes, Blank = No PT: SNF if possible and approved by insurance  OT:   RN:   Equipment:   Other:     LOS: 5 days   Dionne Ano, MD 08/24/2013, 10:28 AM

## 2013-08-25 DIAGNOSIS — G9340 Encephalopathy, unspecified: Secondary | ICD-10-CM | POA: Diagnosis not present

## 2013-08-25 DIAGNOSIS — M6282 Rhabdomyolysis: Secondary | ICD-10-CM | POA: Diagnosis not present

## 2013-08-25 DIAGNOSIS — D72829 Elevated white blood cell count, unspecified: Secondary | ICD-10-CM | POA: Diagnosis not present

## 2013-08-25 DIAGNOSIS — F200 Paranoid schizophrenia: Secondary | ICD-10-CM | POA: Diagnosis not present

## 2013-08-25 LAB — GLUCOSE, CAPILLARY
GLUCOSE-CAPILLARY: 129 mg/dL — AB (ref 70–99)
Glucose-Capillary: 107 mg/dL — ABNORMAL HIGH (ref 70–99)

## 2013-08-25 MED ORDER — CLINDAMYCIN HCL 150 MG PO CAPS: 450.0000 mg | ORAL_CAPSULE | Freq: Three times a day (TID) | ORAL | Status: DC

## 2013-08-25 NOTE — Progress Notes (Signed)
Physical Therapy Treatment Patient Details Name: David Lang MRN: [id number] DOB: 11/05/1950 Today's Date: 08/25/2013    History of Present Illness Admitted with Falls in the setting of L Nondisplaced periprosthetic proximal femur fracture; Acute encephalopathy w/ unclear etiology; Rhabdomyolysis with anion gap metabolic acidosis and elevated LFTs; Acute on chronic renal failure in setting of CKD3; Schizophrenia; 2 recent hospital admissions    PT Comments    Pt. Tolerated transfer to recliner well and was actually able to laterally scoot on edge of bed to position for transfer.  Pt. May do well with sliding board transfer into drop arm recliner.   Will attempt next visit.  Follow Up Recommendations  SNF;Supervision/Assistance - 24 hour     Equipment Recommendations  Wheelchair (measurements PT);Wheelchair cushion (measurements PT);Rolling walker with 5" wheels;3in1 (PT)    Recommendations for Other Services       Precautions / Restrictions Precautions Precautions: Fall Restrictions Weight Bearing Restrictions: Yes LLE Weight Bearing: Non weight bearing    Mobility  Bed Mobility Overal bed mobility: Needs Assistance;+2 for physical assistance Bed Mobility: Supine to Sit     Supine to sit: +2 for physical assistance;Mod assist     General bed mobility comments: hands on support and physical assist to move to edge of bed; use of bed pad to bring hips toward EOB  Transfers Overall transfer level: Needs assistance Equipment used: 2 person hand held assist Transfers: Stand Pivot Transfers Sit to Stand: Max assist;+2 physical assistance Stand pivot transfers: +2 physical assistance;Max assist       General transfer comment: Pt. able to laterally scoot along edge of bed to prepare for stand pivot transfer.  Pt. needed +2 max assist for stand pivot to recliner.  Unable to fully stand on R LE for transfer  and needed max assist from therapist to prevent L LE  weightbearing  Ambulation/Gait Ambulation/Gait assistance:  (pt. unable)               Stairs            Wheelchair Mobility    Modified Rankin (Stroke Patients Only)       Balance                                    Cognition Arousal/Alertness: Awake/alert Behavior During Therapy: Flat affect Overall Cognitive Status: History of cognitive impairments - at baseline       Memory: Decreased recall of precautions;Decreased short-term memory              Exercises      General Comments        Pertinent Vitals/Pain See vitals tab No indication of pain; no distress    Home Living                      Prior Function            PT Goals (current goals can now be found in the care plan section) Progress towards PT goals: Progressing toward goals    Frequency  Min 3X/week    PT Plan Current plan remains appropriate    Co-evaluation             End of Session Equipment Utilized During Treatment: Gait belt Activity Tolerance: Other (comment) (limited by decreased activity tolerance and WB restrictions) Patient left: in chair;with call bell/phone within reach;with chair alarm set  Time: 1610-9604 PT Time Calculation (min): 24 min  Charges:  $Therapeutic Activity: 23-37 mins                    G Codes:      Ferman Hamming 08/25/2013, 2:43 PM Weldon Picking PT Acute Rehab Services [phone] Beeper [phone]

## 2013-08-25 NOTE — Progress Notes (Signed)
[facility]

## 2013-08-25 NOTE — Progress Notes (Signed)
Internal Medicine Attending  Date: 08/25/2013  Patient name: David CarmineFuad Varelas Medical record number: [id number] Date of birth: 07/21/1950 Age: 63 y.o. Gender: male  I saw and evaluated the patient. I developed the assessment and plan with the housestaff and reviewed the resident's note by Dr. Leatha GildingMallory. I agree with the resident's findings and plans as documented in her progress note.

## 2013-08-25 NOTE — Progress Notes (Signed)
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   He says his hip has only a slight amount of discomfort today. He denies headache, changes in vision, chest pain and says he is interested in eating breakfast today.   Upon second visit this morning, he was sleeping; shades were drawn in the room and TV was off.  It has become evident that patient is calm unless intervention is attempted; this has been a major barrier to his care in the hospital.  Objective:  Vital Signs: BP 199/89 P 65 T 97.8 R 18 SpO2 95% Vitals reviewed. BP has ranged from 164/85 to 199/89 General: lying in bed with glasses in place, interacting nicely with sitter; then later in morning, asleep in dark room HEENT: EOMI Cardiac: RRR, normal S1S2, no M/R/G Lungs: CTAB  Abdomen: BS+, soft, nontender to palpation Ext: moving all 4 extremities, foam dressings in place on both knees, C/D/I. Still no IV. Neuro: responsive, interactive GU: condom cath no longer in place  Intake/Output Summary (Last 24 hours) at 08/25/13 0748 Last data filed at 08/25/13 0231  Gross per 24 hour  Intake    180 ml  Output   1300 ml  Net  -1120 ml   Lab Results: No new labs  Medications: I have reviewed the patient's current medications. Scheduled Meds: . amLODipine  10 mg Oral Daily  . aspirin EC  81 mg Oral Daily  . atenolol  100 mg Oral Daily  . chlorhexidine  15 mL Mouth Rinse BID  . clindamycin  450 mg Oral 3 times per day  . clonazePAM  0.25 mg Oral BID  . cloNIDine  0.3 mg Transdermal Weekly  . enoxaparin (LOVENOX) injection  40 mg Subcutaneous Q24H  . feeding supplement (ENSURE COMPLETE)  237 mL Oral BID BM  . haloperidol  15 mg Oral BID  . hydrALAZINE  50 mg Oral TID  . insulin aspart  0-9 Units Subcutaneous TID WC  . lisinopril  10 mg Oral Daily  . morphine  15 mg Oral Q12H  . OXcarbazepine  450  mg Oral BID  . pantoprazole  40 mg Oral Daily  . tamsulosin  0.4 mg Oral Daily  . trihexyphenidyl  2.5 mg Oral q morning - 10a  . trihexyphenidyl  5 mg Oral QHS  . vitamin B-12  1,000 mcg Oral Daily     PRN Meds:.HYDROcodone-acetaminophen, ondansetron, polyethylene glycol  Assessment/Plan: Principal Problem:   Rhabdomyolysis Active Problems:   Hypertension   Schizophrenia, paranoid type   Type II or unspecified type diabetes mellitus without mention of complication, uncontrolled   Chronic anemia   Periprosthetic hip fracture--nondisplaced, Left   Hyponatremia   Acute renal failure superimposed on stage 3 chronic kidney disease   Encephalopathy   Acute encephalopathy   Multiple abrasions b/l knees   Elevated LFTs   Aspiration pneumonia   Falls frequently  63 yo man with HTN, DM and paranoid schizophrenia who presented with recent fall at ALF and was found to have presumed aspiration pneumonia in the context of acute encephalopathy along with rhabdomyolysis with acute on chronic renal failure and elevated LFTs. He had last been admitted on 7/17 for a swollen left leg 2/2 to fall and was d/c'd 7/23. On this admission, he has been treated with antibiotics, hydration and physical therapy, and his WBC and CK trended down; however, he  soon started refusing most intervention (including pills, vital signs, IV placement and blood draws), making care challenging as he awaits placement.   Acute encephalopathy in the setting of paranoid schizophrenia:  back to baseline. Agitated at times, particularly when nursing is intervening (with injections, meals, vital signs or pills). Home medications have been attempted, though patient pulled out his IV and is only periodically taking medications PO (nurse has been hiding them in his applesauce, which he partially finishes).  - will ask nursing team to stimulate patient to stay awake today by opening shades in room, turing on TV, bringing him out to the  nursing desk in his chair; attempt to keep him from shifting day/night again (especially as sitter has been d/c)  Leukocytosis with presumed aspiration PNA in the setting of acute encephalopathy: as per explanation above. Remains afebrile. Leukocytosis trended down from 17.5 on admission to 12.9.  - Today is day 7/7 of PO clindamycin 450mg  TID - Urine culture NGTD   Rhabdomyolysis with acute on chronic renal failure and elevated LFTs: likely secondary to falls and decreased mobility in setting of recent hip fracture. Improved with aggressive IVF hydration. Cr trended down 3.85 --> 2.21, K stable at 4.1, and LFTs trended down. CK down from 4024 on admission to 998. Patient pulled out his IV. However, pre-renal etiology is also evident given improvement with hydration. - Encouraging fluid intake (as much as he allows)  Frequent falls: 2 falls since prior hospitalization while back at ALF. Has resumed PT - continued recommendations for SNF this admission. Thankfully, R foot xray on this admission showed no acute fracture or dislocation, but does show flexion deformities and extensive arterial vascular calcifications. Portable R hip xray with no fracture or dislocation seen. Over the past month, he has continually returned to the hospital from his ALF and needs 24 hour care. - PT (completed yesterday) x3/week - Dispo pending hopeful insurance acceptance of SNF  HTN: elevated during this admission, especially in setting of agitation (patient gets agitated when BP cuff is placed, so difficult to assess). Patient is being given his PO meds in applesauce when he is refusing, so difficult to assess which pills are being consumed. - Continue home medications including clonidine (0.3 mg patch, last changed 7/26), atenolol (100mg ), hydralazine (50mg ), lisinopril (10mg ) and amlodipine (10mg )  DM2: - Monitor cbg as patient allows - SSI sensitive  Diet: Carb mod diet - SLP recommends regular, thin liquids with  patient able to feed self - Registered dietician has suggested Ensure Complete again, though patient is refusing it (does not trust it)  DVT PPx: switched from heparin to lovenox (fewer injections)  Dispo: Disposition is deferred at this time, awaiting SNF placement. SW has PASSAR #, [facility]; paperwork submitted to insurance; continuing to await insurance clearance; sitter has been d/c in anticipation of impending SNF placement as per SW.   The patient does have a current PCP (Provider Default, MD) and will have hospital follow-up appointment after discharge at SNF.  The patient does not have transportation limitations that hinder transportation to clinic appointments.  Services Needed at time of discharge: Y = Yes, Blank = No PT: SNF if possible and approved by insurance  OT:   RN:   Equipment:   Other:     LOS: 6 days   Dionne AnoJulia Elley Harp, MD 08/25/2013, 7:48 AM

## 2013-08-25 NOTE — Progress Notes (Signed)
Pts. SBP this am was >190 pt. not agitated at that point and resting, Dr. Senaida Oresichardson made aware with orders made to give early dose of norvasc and hydralazine. Will cont. to monitor pt.

## 2013-08-30 NOTE — ED Provider Notes (Signed)
CSN: [id number]     Arrival date & time 08/19/13  4098 History   First MD Initiated Contact with Patient 08/19/13 [phone]     Chief Complaint  Patient presents with  . Altered Mental Status     (Consider location/radiation/quality/duration/timing/severity/associated sxs/prior Treatment) HPI Comments: LEVEL 5 CAVEAT FOR ALTERED MENTAL STATUS  Pt is a 63 y/o M w/ PMHx of HTN, DM, and paranoid schizophrenia who presents from his assisted living facility at [facility]  w/ CONFUSION. Pt was just discharged from the hospital. Pt sent to the ER after the nursing home found him unresponsive. EMS reports that pt is only moaning and groaning but able to follow commands. CBG 166.   Patient is a 63 y.o. male presenting with altered mental status. The history is provided by medical records and the EMS personnel.  Altered Mental Status   Past Medical History  Diagnosis Date  . Hypertension   . Diabetes mellitus without complication   . Mental disorder   . Schizophrenia    Past Surgical History  Procedure Laterality Date  . Hip arthroplasty Left 03/31/2012    Procedure: ARTHROPLASTY BIPOLAR HIP;  Surgeon: Mable Paris, MD;  Location: [facility]S;  Service: Orthopedics;  Laterality: Left;   History reviewed. No pertinent family history. History  Substance Use Topics  . Smoking status: Never Smoker   . Smokeless tobacco: Never Used  . Alcohol Use: No     Comment: not answering    Review of Systems  Unable to perform ROS: Mental status change      Allergies  Review of patient's allergies indicates no known allergies.  Home Medications   Prior to Admission medications   Medication Sig Start Date End Date Taking? Authorizing Provider  amLODipine (NORVASC) 10 MG tablet Take 1 tablet (10 mg total) by mouth daily. 04/18/12  Yes Kela Millin, MD  aspirin EC 81 MG tablet Take 81 mg by mouth daily.   Yes Historical Provider, MD  atenolol (TENORMIN) 100 MG tablet Take 1 tablet  (100 mg total) by mouth daily. 04/18/12  Yes Adeline Joselyn Glassman, MD  clonazePAM (KLONOPIN) 0.5 MG tablet Take 0.5 tablets (0.25 mg total) by mouth 2 (two) times daily. 04/18/12  Yes Adeline Joselyn Glassman, MD  cloNIDine (CATAPRES - DOSED IN MG/24 HR) 0.3 mg/24hr Place 1 patch (0.3 mg total) onto the skin once a week. 04/18/12  Yes Adeline Joselyn Glassman, MD  haloperidol (HALDOL) 10 MG tablet Take 15 mg by mouth 2 (two) times daily.   Yes Historical Provider, MD  haloperidol (HALDOL) 2 MG tablet Take 2 mg by mouth daily as needed (for increased psychosis/hallucinations with agitation or aggression.).   Yes Historical Provider, MD  hydrALAZINE (APRESOLINE) 50 MG tablet Take 50 mg by mouth 3 (three) times daily.   Yes Historical Provider, MD  lisinopril (PRINIVIL,ZESTRIL) 10 MG tablet Take 10 mg by mouth daily.   Yes Historical Provider, MD  loperamide (IMODIUM) 2 MG capsule Take 2 mg by mouth 3 (three) times daily as needed for diarrhea or loose stools.   Yes Historical Provider, MD  ondansetron (ZOFRAN) 4 MG tablet Take 1 tablet (4 mg total) by mouth every 6 (six) hours as needed for nausea. 04/18/12  Yes Adeline Joselyn Glassman, MD  OXcarbazepine (TRILEPTAL) 150 MG tablet Take 3 tablets (450 mg total) by mouth 2 (two) times daily. 04/18/12  Yes Adeline Joselyn Glassman, MD  OYSTER SHELL CALCIUM PO Take 1 tablet by mouth daily.   Yes  Historical Provider, MD  pantoprazole (PROTONIX) 40 MG tablet Take 1 tablet (40 mg total) by mouth daily. 04/18/12  Yes Adeline Joselyn Glassman Viyuoh, MD  polyethylene glycol (MIRALAX / GLYCOLAX) packet Take 17 g by mouth daily as needed for mild constipation. 04/18/12  Yes Adeline Joselyn Glassman Viyuoh, MD  spironolactone (ALDACTONE) 25 MG tablet Take 12.5 mg by mouth daily.   Yes Historical Provider, MD  tamsulosin (FLOMAX) 0.4 MG CAPS capsule Take 0.4 mg by mouth.   Yes Historical Provider, MD  trihexyphenidyl (ARTANE) 5 MG tablet Take 2.5-5 mg by mouth See admin instructions. Take 2.5mg  by mouth in the morning and take 5mg  by  mouth at bedtime.   Yes Historical Provider, MD  vitamin B-12 1000 MCG tablet Take 1 tablet (1,000 mcg total) by mouth daily. 04/18/12  Yes Adeline Joselyn Glassman Viyuoh, MD  clindamycin (CLEOCIN) 150 MG capsule Take 3 capsules (450 mg total) by mouth every 8 (eight) hours. 08/25/13   Dionne AnoJulia Mallory, MD  feeding supplement, ENSURE COMPLETE, (ENSURE COMPLETE) LIQD Take 237 mLs by mouth 3 (three) times daily between meals. 08/17/13   Dionne AnoJulia Mallory, MD  HYDROcodone-acetaminophen (NORCO/VICODIN) 5-325 MG per tablet Take 1 tablet by mouth every 8 (eight) hours as needed (for breakthrough pain). 08/17/13   Dionne AnoJulia Mallory, MD  morphine (MS CONTIN) 15 MG 12 hr tablet Take 1 tablet (15 mg total) by mouth every 12 (twelve) hours. 08/17/13   Dionne AnoJulia Mallory, MD   BP 170/78  Pulse 72  Temp(Src) 97 F (36.1 C) (Oral)  Resp 16  Ht 6' 0.05" (1.83 m)  Wt 200 lb 6.4 oz (90.901 kg)  BMI 27.14 kg/m2  SpO2 95% Physical Exam  Nursing note and vitals reviewed. Constitutional: He is oriented to person, place, and time. He appears well-developed.  HENT:  Head: Normocephalic and atraumatic.  Eyes: Conjunctivae and EOM are normal. Pupils are equal, round, and reactive to light.  Neck: Normal range of motion. Neck supple.  Cardiovascular: Normal rate and regular rhythm.   Pulmonary/Chest: Effort normal and breath sounds normal.  Abdominal: Soft. Bowel sounds are normal. He exhibits no distension. There is no tenderness. There is no rebound and no guarding.  Neurological: He is alert and oriented to person, place, and time.  Skin: Skin is warm.    ED Course  Procedures (including critical care time) Labs Review Labs Reviewed  COMPREHENSIVE METABOLIC PANEL - Abnormal; Notable for the following:    Sodium 136 (*)    Chloride 93 (*)    Glucose, Bld 169 (*)    BUN 53 (*)    Creatinine, Ser 3.85 (*)    Albumin 3.0 (*)    AST 59 (*)    Alkaline Phosphatase 119 (*)    GFR calc non Af Amer 15 (*)    GFR calc Af Amer 18 (*)     Anion gap 19 (*)    All other components within normal limits  URINALYSIS, ROUTINE W REFLEX MICROSCOPIC - Abnormal; Notable for the following:    APPearance CLOUDY (*)    Glucose, UA 250 (*)    Hgb urine dipstick TRACE (*)    Bilirubin Urine SMALL (*)    Protein, ur >300 (*)    All other components within normal limits  URINE MICROSCOPIC-ADD ON - Abnormal; Notable for the following:    Bacteria, UA MANY (*)    Casts GRANULAR CAST (*)    All other components within normal limits  CBC WITH DIFFERENTIAL - Abnormal; Notable for the following:  WBC 17.5 (*)    RBC 3.85 (*)    Hemoglobin 11.3 (*)    HCT 34.4 (*)    Platelets 475 (*)    Neutrophils Relative % 82 (*)    Neutro Abs 14.3 (*)    Lymphocytes Relative 10 (*)    Monocytes Absolute 1.3 (*)    All other components within normal limits  GLUCOSE, CAPILLARY - Abnormal; Notable for the following:    Glucose-Capillary 166 (*)    All other components within normal limits  CK - Abnormal; Notable for the following:    Total CK 4024 (*)    All other components within normal limits  PHOSPHORUS - Abnormal; Notable for the following:    Phosphorus 4.7 (*)    All other components within normal limits  GLUCOSE, CAPILLARY - Abnormal; Notable for the following:    Glucose-Capillary 135 (*)    All other components within normal limits  CK - Abnormal; Notable for the following:    Total CK 2279 (*)    All other components within normal limits  CBC - Abnormal; Notable for the following:    WBC 12.9 (*)    RBC 3.77 (*)    Hemoglobin 10.9 (*)    HCT 34.3 (*)    Platelets 477 (*)    All other components within normal limits  COMPREHENSIVE METABOLIC PANEL - Abnormal; Notable for the following:    Glucose, Bld 130 (*)    BUN 46 (*)    Creatinine, Ser 2.93 (*)    Albumin 2.6 (*)    AST 40 (*)    GFR calc non Af Amer 21 (*)    GFR calc Af Amer 25 (*)    Anion gap 20 (*)    All other components within normal limits  GLUCOSE, CAPILLARY  - Abnormal; Notable for the following:    Glucose-Capillary 148 (*)    All other components within normal limits  GLUCOSE, CAPILLARY - Abnormal; Notable for the following:    Glucose-Capillary 137 (*)    All other components within normal limits  GLUCOSE, CAPILLARY - Abnormal; Notable for the following:    Glucose-Capillary 124 (*)    All other components within normal limits  GLUCOSE, CAPILLARY - Abnormal; Notable for the following:    Glucose-Capillary 142 (*)    All other components within normal limits  BASIC METABOLIC PANEL - Abnormal; Notable for the following:    Glucose, Bld 175 (*)    BUN 35 (*)    Creatinine, Ser 2.21 (*)    Calcium 8.3 (*)    GFR calc non Af Amer 30 (*)    GFR calc Af Amer 35 (*)    All other components within normal limits  CK - Abnormal; Notable for the following:    Total CK 998 (*)    All other components within normal limits  GLUCOSE, CAPILLARY - Abnormal; Notable for the following:    Glucose-Capillary 147 (*)    All other components within normal limits  GLUCOSE, CAPILLARY - Abnormal; Notable for the following:    Glucose-Capillary 147 (*)    All other components within normal limits  GLUCOSE, CAPILLARY - Abnormal; Notable for the following:    Glucose-Capillary 215 (*)    All other components within normal limits  GLUCOSE, CAPILLARY - Abnormal; Notable for the following:    Glucose-Capillary 130 (*)    All other components within normal limits  GLUCOSE, CAPILLARY - Abnormal; Notable for the following:  Glucose-Capillary 103 (*)    All other components within normal limits  GLUCOSE, CAPILLARY - Abnormal; Notable for the following:    Glucose-Capillary 162 (*)    All other components within normal limits  GLUCOSE, CAPILLARY - Abnormal; Notable for the following:    Glucose-Capillary 171 (*)    All other components within normal limits  GLUCOSE, CAPILLARY - Abnormal; Notable for the following:    Glucose-Capillary 235 (*)    All other  components within normal limits  GLUCOSE, CAPILLARY - Abnormal; Notable for the following:    Glucose-Capillary 116 (*)    All other components within normal limits  GLUCOSE, CAPILLARY - Abnormal; Notable for the following:    Glucose-Capillary 150 (*)    All other components within normal limits  GLUCOSE, CAPILLARY - Abnormal; Notable for the following:    Glucose-Capillary 166 (*)    All other components within normal limits  GLUCOSE, CAPILLARY - Abnormal; Notable for the following:    Glucose-Capillary 158 (*)    All other components within normal limits  GLUCOSE, CAPILLARY - Abnormal; Notable for the following:    Glucose-Capillary 104 (*)    All other components within normal limits  GLUCOSE, CAPILLARY - Abnormal; Notable for the following:    Glucose-Capillary 142 (*)    All other components within normal limits  GLUCOSE, CAPILLARY - Abnormal; Notable for the following:    Glucose-Capillary 211 (*)    All other components within normal limits  GLUCOSE, CAPILLARY - Abnormal; Notable for the following:    Glucose-Capillary 146 (*)    All other components within normal limits  GLUCOSE, CAPILLARY - Abnormal; Notable for the following:    Glucose-Capillary 107 (*)    All other components within normal limits  GLUCOSE, CAPILLARY - Abnormal; Notable for the following:    Glucose-Capillary 129 (*)    All other components within normal limits  URINE CULTURE  TROPONIN I  SODIUM, URINE, RANDOM  CREATININE, URINE, RANDOM  CBC WITH DIFFERENTIAL    Imaging Review No results found.   EKG Interpretation   Date/Time:  Saturday August 19 2013 03:24:09 EDT Ventricular Rate:  76 PR Interval:  175 QRS Duration: 100 QT Interval:  400 QTC Calculation: 450 R Axis:   30 Text Interpretation:  Sinus rhythm Minimal ST depression, lateral leads  Confirmed by Rhunette Croft, MD, Janey Genta [phone]) on 08/19/2013 5:36:03 AM      MDM   Final diagnoses:  Encephalopathy acute  UTI (lower urinary  tract infection)  AKI (acute kidney injury)    DDx includes: ICH Stroke ACS Sepsis syndrome Infection - UTI/Pneumonia Electrolyte abnormality Drug overdose DKA Metabolic disorders including thyroid disorders, adrenal insufficiency Acute anemia Cancer of unknown origin Hypercapnia COPD Hypoxia  PT with acute encephalopathy. Has a UTI - cefepime given, as it could be HC associated uti. Rest of the ER workup is not indicative of any specific cause. Will admit for uti, confusion.    Derwood Kaplan, MD 08/30/13 [phone]

## 2013-09-18 ENCOUNTER — Emergency Department (HOSPITAL_COMMUNITY): Payer: BC Managed Care – PPO

## 2013-09-18 ENCOUNTER — Encounter (HOSPITAL_COMMUNITY): Payer: Self-pay | Admitting: Emergency Medicine

## 2013-09-18 ENCOUNTER — Emergency Department (HOSPITAL_COMMUNITY)
Admission: EM | Admit: 2013-09-18 | Discharge: 2013-09-18 | Disposition: A | Discharge status: Home / Self Care | Payer: BC Managed Care – PPO | Attending: Emergency Medicine | Admitting: Emergency Medicine

## 2013-09-18 DIAGNOSIS — F209 Schizophrenia, unspecified: Secondary | ICD-10-CM | POA: Insufficient documentation

## 2013-09-18 DIAGNOSIS — I10 Essential (primary) hypertension: Secondary | ICD-10-CM | POA: Insufficient documentation

## 2013-09-18 DIAGNOSIS — Z79899 Other long term (current) drug therapy: Secondary | ICD-10-CM | POA: Insufficient documentation

## 2013-09-18 DIAGNOSIS — S0180XA Unspecified open wound of other part of head, initial encounter: Secondary | ICD-10-CM | POA: Insufficient documentation

## 2013-09-18 DIAGNOSIS — S0993XA Unspecified injury of face, initial encounter: Secondary | ICD-10-CM | POA: Diagnosis present

## 2013-09-18 DIAGNOSIS — Y9389 Activity, other specified: Secondary | ICD-10-CM | POA: Diagnosis not present

## 2013-09-18 DIAGNOSIS — F0390 Unspecified dementia without behavioral disturbance: Secondary | ICD-10-CM | POA: Insufficient documentation

## 2013-09-18 DIAGNOSIS — E119 Type 2 diabetes mellitus without complications: Secondary | ICD-10-CM | POA: Diagnosis not present

## 2013-09-18 DIAGNOSIS — W010XXA Fall on same level from slipping, tripping and stumbling without subsequent striking against object, initial encounter: Secondary | ICD-10-CM | POA: Insufficient documentation

## 2013-09-18 DIAGNOSIS — Z7982 Long term (current) use of aspirin: Secondary | ICD-10-CM | POA: Diagnosis not present

## 2013-09-18 DIAGNOSIS — S022XXA Fracture of nasal bones, initial encounter for closed fracture: Secondary | ICD-10-CM | POA: Diagnosis not present

## 2013-09-18 DIAGNOSIS — Y9289 Other specified places as the place of occurrence of the external cause: Secondary | ICD-10-CM | POA: Diagnosis not present

## 2013-09-18 DIAGNOSIS — W19XXXA Unspecified fall, initial encounter: Secondary | ICD-10-CM

## 2013-09-18 DIAGNOSIS — S199XXA Unspecified injury of neck, initial encounter: Secondary | ICD-10-CM

## 2013-09-18 DIAGNOSIS — IMO0002 Reserved for concepts with insufficient information to code with codable children: Secondary | ICD-10-CM

## 2013-09-18 MED ORDER — LIDOCAINE HCL 1 % IJ SOLN
30.0000 mL | Freq: Once | INTRAMUSCULAR | Status: AC
  Administered 2013-09-18: 30 mL
  Filled 2013-09-18: qty 40

## 2013-09-18 NOTE — ED Notes (Signed)
Follow up call to [facility] to check the status of transport. There are still no trucks available at the moment.

## 2013-09-18 NOTE — ED Notes (Signed)
Pt lives in nursing home and he is a night walker and he tripped over scales and face planted into floor,  Now has laceration on face,  Nose deformity

## 2013-09-18 NOTE — ED Notes (Signed)
Pt resting quietly; no complaints

## 2013-09-18 NOTE — ED Notes (Signed)
Patient is currently waiting on [facility] arrival

## 2013-09-18 NOTE — ED Notes (Signed)
Pt returned to room from xray.

## 2013-09-18 NOTE — ED Notes (Signed)
No LOC,  Pt is alert and oriented per his norm,  He also wears brief

## 2013-09-18 NOTE — ED Notes (Signed)
Bed: WU98 Expected date: 09/18/13 Expected time:  Means of arrival:  Comments: Bed 24, EMS, Fall

## 2013-09-18 NOTE — ED Provider Notes (Signed)
Medical screening examination/treatment/procedure(s) were performed by non-physician practitioner and as supervising physician I was immediately available for consultation/collaboration.   EKG Interpretation None        Quincey Quesinberry, MD 09/18/13 0521 

## 2013-09-18 NOTE — ED Notes (Signed)
Call placed to facility x 2 and the phone just rings.

## 2013-09-18 NOTE — ED Provider Notes (Signed)
CSN: [id number]     Arrival date & time 09/18/13  0101 History   First MD Initiated Contact with Patient 09/18/13 0114     Chief Complaint  Patient presents with  . Fall  . Facial Laceration  . Facial Injury     (Consider location/radiation/quality/duration/timing/severity/associated sxs/prior Treatment) HPI Comments: Patient is slightly confused, stating he called the police because he fell.  Patient was in a nursing home, and was seen to trip over the scale and color-flow or hitting his face on the floor.  Patient is a 63 y.o. male presenting with fall and facial injury. The history is provided by the patient.  Fall This is a new problem. The current episode started today. The problem occurs constantly. The problem has been unchanged. Pertinent negatives include no neck pain or vomiting. Nothing aggravates the symptoms. He has tried nothing for the symptoms. The treatment provided no relief.  Facial Injury Associated symptoms: no neck pain and no vomiting     Past Medical History  Diagnosis Date  . Hypertension   . Diabetes mellitus without complication   . Mental disorder   . Schizophrenia    Past Surgical History  Procedure Laterality Date  . Hip arthroplasty Left 03/31/2012    Procedure: ARTHROPLASTY BIPOLAR HIP;  Surgeon: Mable Paris, MD;  Location: [facility];  Service: Orthopedics;  Laterality: Left;   History reviewed. No pertinent family history. History  Substance Use Topics  . Smoking status: Never Smoker   . Smokeless tobacco: Never Used  . Alcohol Use: No     Comment: not answering    Review of Systems  Unable to perform ROS: Dementia  Gastrointestinal: Negative for vomiting.  Musculoskeletal: Negative for neck pain.  Skin: Positive for wound.  Psychiatric/Behavioral: Positive for confusion.  All other systems reviewed and are negative.     Allergies  Review of patient's allergies indicates no known allergies.  Home Medications   Prior to  Admission medications   Medication Sig Start Date End Date Taking? Authorizing Provider  amLODipine (NORVASC) 10 MG tablet Take 1 tablet (10 mg total) by mouth daily. 04/18/12  Yes Kela Millin, MD  aspirin EC 81 MG tablet Take 81 mg by mouth daily.   Yes Historical Provider, MD  atenolol (TENORMIN) 100 MG tablet Take 1 tablet (100 mg total) by mouth daily. 04/18/12  Yes Adeline Joselyn Glassman, MD  cetirizine (ZYRTEC) 10 MG tablet Take 10 mg by mouth every evening.   Yes Historical Provider, MD  clonazePAM (KLONOPIN) 0.5 MG tablet Take 0.5 tablets (0.25 mg total) by mouth 2 (two) times daily. 04/18/12  Yes Adeline Joselyn Glassman, MD  cloNIDine (CATAPRES - DOSED IN MG/24 HR) 0.3 mg/24hr Place 1 patch (0.3 mg total) onto the skin once a week. 04/18/12  Yes Adeline Joselyn Glassman, MD  feeding supplement, ENSURE COMPLETE, (ENSURE COMPLETE) LIQD Take 237 mLs by mouth 3 (three) times daily between meals. 08/17/13  Yes Dionne Ano, MD  haloperidol (HALDOL) 10 MG tablet Take 15 mg by mouth 2 (two) times daily.   Yes Historical Provider, MD  hydrALAZINE (APRESOLINE) 50 MG tablet Take 50 mg by mouth 3 (three) times daily.   Yes Historical Provider, MD  lisinopril (PRINIVIL,ZESTRIL) 10 MG tablet Take 10 mg by mouth daily.   Yes Historical Provider, MD  morphine (MS CONTIN) 15 MG 12 hr tablet Take 1 tablet (15 mg total) by mouth every 12 (twelve) hours. 08/17/13  Yes Dionne Ano, MD  OYSTER SHELL CALCIUM  PO Take 1 tablet by mouth daily.   Yes Historical Provider, MD  pantoprazole (PROTONIX) 40 MG tablet Take 1 tablet (40 mg total) by mouth daily. 04/18/12  Yes Adeline Joselyn Glassman, MD  spironolactone (ALDACTONE) 25 MG tablet Take 12.5 mg by mouth daily.   Yes Historical Provider, MD  tamsulosin (FLOMAX) 0.4 MG CAPS capsule Take 0.4 mg by mouth.   Yes Historical Provider, MD  trihexyphenidyl (ARTANE) 5 MG tablet Take 2.5-5 mg by mouth See admin instructions. Take 2.5mg  by mouth in the morning and take  by mouth at bedtime.   Yes  Historical Provider, MD  vitamin B-12 1000 MCG tablet Take 1 tablet (1,000 mcg total) by mouth daily. 04/18/12  Yes Adeline Joselyn Glassman, MD  haloperidol (HALDOL) 2 MG tablet Take 2 mg by mouth daily as needed (for increased psychosis/hallucinations with agitation or aggression.).    Historical Provider, MD  HYDROcodone-acetaminophen (NORCO/VICODIN) 5-325 MG per tablet Take 1 tablet by mouth every 8 (eight) hours as needed (for breakthrough pain). 08/17/13   Dionne Ano, MD  loperamide (IMODIUM) 2 MG capsule Take 2 mg by mouth 3 (three) times daily as needed for diarrhea or loose stools.    Historical Provider, MD  ondansetron (ZOFRAN) 4 MG tablet Take 1 tablet (4 mg total) by mouth every 6 (six) hours as needed for nausea. 04/18/12   Kela Millin, MD  polyethylene glycol (MIRALAX / GLYCOLAX) packet Take 17 g by mouth daily as needed for mild constipation. 04/18/12   Kela Millin, MD   BP 161/74  Pulse 65  Temp(Src) 97.9 F (36.6 C) (Oral)  Resp 12  SpO2 99% Physical Exam  Nursing note and vitals reviewed. Constitutional: He appears well-developed and well-nourished.  HENT:  Head: Normocephalic.    Right Ear: External ear normal.  Left Ear: External ear normal.  Neck: Normal range of motion.  Cardiovascular: Normal rate.   Pulmonary/Chest: Effort normal.  Musculoskeletal: Normal range of motion.  Neurological: He is alert.  Skin: Skin is warm.    ED Course  LACERATION REPAIR Date/Time: 09/18/2013 3:06 AM Performed by: Arman Filter Authorized by: Arman Filter Consent: Verbal consent obtained. written consent not obtained. The procedure was performed in an emergent situation. Risks and benefits: risks, benefits and alternatives were discussed Consent given by: patient Patient understanding: patient states understanding of the procedure being performed Patient identity confirmed: arm band Time out: Immediately prior to procedure a "time out" was called to verify the correct  patient, procedure, equipment, support staff and site/side marked as required. Body area: head/neck Location details: right eyebrow Laceration length: 2 cm Foreign bodies: unknown Tendon involvement: none Nerve involvement: none Vascular damage: no Anesthesia: local infiltration Local anesthetic: lidocaine 1% with epinephrine Anesthetic total: 2 ml Patient sedated: no Irrigation solution: saline Amount of cleaning: standard Debridement: none Degree of undermining: none Skin closure: 5-0 Prolene Number of sutures: 4 Technique: simple Approximation: close Approximation difficulty: simple Dressing: antibiotic ointment Patient tolerance: Patient tolerated the procedure well with no immediate complications.   (including critical care time) Labs Review Labs Reviewed - No data to display  Imaging Review Dg Cervical Spine Complete  09/18/2013   CLINICAL DATA:  Fall  EXAM: CERVICAL SPINE  4+ VIEWS  COMPARISON:  None.  FINDINGS: There is no evidence of cervical spine fracture or prevertebral soft tissue swelling. Mild levoscoliosis present. No listhesis.  Moderate degenerative disc disease with anterior endplate spurring seen at the C4-5 and C5-6 levels.  IMPRESSION:  No acute traumatic injury within the cervical spine.   Electronically Signed   By: Rise Mu M.D.   On: 09/18/2013 02:53   Ct Head Wo Contrast  09/18/2013   CLINICAL DATA:  Fall  EXAM: CT HEAD WITHOUT CONTRAST  TECHNIQUE: Contiguous axial images were obtained from the base of the skull through the vertex without intravenous contrast.  COMPARISON:  Prior CT from 08/19/2013  FINDINGS: Atrophy with chronic small vessel ischemic disease present, stable from prior. Remote lacunar infarct present within the right internal capsule.  There is no acute intracranial hemorrhage or infarct. No mass lesion or midline shift. Gray-white matter differentiation is well maintained. Ventricles are normal in size without evidence of  hydrocephalus. CSF containing spaces are within normal limits. No extra-axial fluid collection.  The calvarium is intact.  Orbital soft tissues are within normal limits.  The paranasal sinuses and mastoid air cells are well pneumatized and free of fluid. Probable acute nondisplaced bilateral nasal bone fractures present (series 3, image 7).  Laceration with swelling present at the right forehead.  IMPRESSION: 1. No acute intracranial process. 2. Acute bilateral nondisplaced nasal bone fractures. 3. Small right forehead scalp laceration. 4. Moderate atrophy with chronic small vessel ischemic disease.   Electronically Signed   By: Rise Mu M.D.   On: 09/18/2013 02:05     EKG Interpretation None      MDM   Final diagnoses:  Fall, initial encounter  Nasal bone fracture, closed, initial encounter  Laceration        Arman Filter, NP 09/18/13 1610  Arman Filter, NP 09/18/13 [phone]

## 2013-09-18 NOTE — ED Notes (Signed)
Patient transported to X-ray 

## 2013-09-18 NOTE — Discharge Instructions (Signed)
Please have David Lang see his PCP in 10 days for suture removal

## 2014-02-08 ENCOUNTER — Encounter (HOSPITAL_COMMUNITY): Payer: Self-pay | Admitting: Orthopaedic Surgery

## 2014-09-29 ENCOUNTER — Inpatient Hospital Stay (HOSPITAL_COMMUNITY)
Admission: EM | Admit: 2014-09-29 | Discharge: 2014-10-03 | DRG: 368 | Disposition: A | Discharge status: Nursing Home (Includes ICF) | Payer: BLUE CROSS/BLUE SHIELD | Attending: Internal Medicine | Admitting: Internal Medicine

## 2014-09-29 ENCOUNTER — Emergency Department (HOSPITAL_COMMUNITY): Payer: BLUE CROSS/BLUE SHIELD

## 2014-09-29 ENCOUNTER — Encounter (HOSPITAL_COMMUNITY): Payer: Self-pay | Admitting: Internal Medicine

## 2014-09-29 DIAGNOSIS — K2210 Ulcer of esophagus without bleeding: Secondary | ICD-10-CM | POA: Diagnosis present

## 2014-09-29 DIAGNOSIS — D62 Acute posthemorrhagic anemia: Secondary | ICD-10-CM | POA: Diagnosis present

## 2014-09-29 DIAGNOSIS — Z96642 Presence of left artificial hip joint: Secondary | ICD-10-CM | POA: Diagnosis present

## 2014-09-29 DIAGNOSIS — K920 Hematemesis: Secondary | ICD-10-CM | POA: Diagnosis present

## 2014-09-29 DIAGNOSIS — Z79899 Other long term (current) drug therapy: Secondary | ICD-10-CM | POA: Diagnosis not present

## 2014-09-29 DIAGNOSIS — K449 Diaphragmatic hernia without obstruction or gangrene: Secondary | ICD-10-CM | POA: Diagnosis present

## 2014-09-29 DIAGNOSIS — Z79891 Long term (current) use of opiate analgesic: Secondary | ICD-10-CM

## 2014-09-29 DIAGNOSIS — R197 Diarrhea, unspecified: Secondary | ICD-10-CM | POA: Diagnosis present

## 2014-09-29 DIAGNOSIS — G9340 Encephalopathy, unspecified: Secondary | ICD-10-CM | POA: Diagnosis not present

## 2014-09-29 DIAGNOSIS — Z8249 Family history of ischemic heart disease and other diseases of the circulatory system: Secondary | ICD-10-CM

## 2014-09-29 DIAGNOSIS — D649 Anemia, unspecified: Secondary | ICD-10-CM | POA: Diagnosis present

## 2014-09-29 DIAGNOSIS — E1122 Type 2 diabetes mellitus with diabetic chronic kidney disease: Secondary | ICD-10-CM | POA: Diagnosis not present

## 2014-09-29 DIAGNOSIS — I129 Hypertensive chronic kidney disease with stage 1 through stage 4 chronic kidney disease, or unspecified chronic kidney disease: Secondary | ICD-10-CM | POA: Diagnosis present

## 2014-09-29 DIAGNOSIS — E7220 Disorder of urea cycle metabolism, unspecified: Secondary | ICD-10-CM | POA: Diagnosis not present

## 2014-09-29 DIAGNOSIS — I517 Cardiomegaly: Secondary | ICD-10-CM | POA: Diagnosis not present

## 2014-09-29 DIAGNOSIS — K226 Gastro-esophageal laceration-hemorrhage syndrome: Principal | ICD-10-CM | POA: Diagnosis present

## 2014-09-29 DIAGNOSIS — F200 Paranoid schizophrenia: Secondary | ICD-10-CM | POA: Diagnosis present

## 2014-09-29 DIAGNOSIS — K760 Fatty (change of) liver, not elsewhere classified: Secondary | ICD-10-CM | POA: Diagnosis present

## 2014-09-29 DIAGNOSIS — E872 Acidosis, unspecified: Secondary | ICD-10-CM | POA: Diagnosis present

## 2014-09-29 DIAGNOSIS — R61 Generalized hyperhidrosis: Secondary | ICD-10-CM | POA: Diagnosis present

## 2014-09-29 DIAGNOSIS — N183 Chronic kidney disease, stage 3 unspecified: Secondary | ICD-10-CM | POA: Diagnosis present

## 2014-09-29 DIAGNOSIS — Z833 Family history of diabetes mellitus: Secondary | ICD-10-CM | POA: Diagnosis not present

## 2014-09-29 DIAGNOSIS — K922 Gastrointestinal hemorrhage, unspecified: Secondary | ICD-10-CM | POA: Diagnosis present

## 2014-09-29 DIAGNOSIS — Z86010 Personal history of colonic polyps: Secondary | ICD-10-CM

## 2014-09-29 DIAGNOSIS — N179 Acute kidney failure, unspecified: Secondary | ICD-10-CM | POA: Diagnosis present

## 2014-09-29 DIAGNOSIS — R7989 Other specified abnormal findings of blood chemistry: Secondary | ICD-10-CM

## 2014-09-29 DIAGNOSIS — Z7982 Long term (current) use of aspirin: Secondary | ICD-10-CM

## 2014-09-29 DIAGNOSIS — I10 Essential (primary) hypertension: Secondary | ICD-10-CM | POA: Diagnosis present

## 2014-09-29 HISTORY — DX: Gastro-esophageal laceration-hemorrhage syndrome: K22.6

## 2014-09-29 HISTORY — DX: Unspecified intracapsular fracture of unspecified femur, initial encounter for closed fracture: S72.019A

## 2014-09-29 HISTORY — DX: Presence of unspecified artificial hip joint: Z96.649

## 2014-09-29 HISTORY — DX: Cyst of kidney, acquired: N28.1

## 2014-09-29 HISTORY — DX: Periprosthetic fracture around other internal prosthetic joint, initial encounter: M97.8XXA

## 2014-09-29 HISTORY — DX: Fracture of unspecified part of neck of unspecified femur, initial encounter for closed fracture: S72.009A

## 2014-09-29 HISTORY — DX: Pneumonitis due to inhalation of food and vomit: J69.0

## 2014-09-29 HISTORY — DX: Fatty (change of) liver, not elsewhere classified: K76.0

## 2014-09-29 HISTORY — DX: Ulcer of esophagus without bleeding: K22.10

## 2014-09-29 LAB — CBC WITH DIFFERENTIAL/PLATELET
Basophils Absolute: 0 10*3/uL (ref 0.0–0.1)
Basophils Relative: 0 % (ref 0–1)
Eosinophils Absolute: 0 10*3/uL (ref 0.0–0.7)
Eosinophils Relative: 0 % (ref 0–5)
HEMATOCRIT: 29.6 % — AB (ref 39.0–52.0)
HEMOGLOBIN: 9.3 g/dL — AB (ref 13.0–17.0)
LYMPHS ABS: 3.2 10*3/uL (ref 0.7–4.0)
Lymphocytes Relative: 27 % (ref 12–46)
MCH: 28.3 pg (ref 26.0–34.0)
MCHC: 31.4 g/dL (ref 30.0–36.0)
MCV: 90 fL (ref 78.0–100.0)
MONOS PCT: 12 % (ref 3–12)
Monocytes Absolute: 1.4 10*3/uL — ABNORMAL HIGH (ref 0.1–1.0)
NEUTROS PCT: 61 % (ref 43–77)
Neutro Abs: 7.5 10*3/uL (ref 1.7–7.7)
Platelets: 352 10*3/uL (ref 150–400)
RBC: 3.29 MIL/uL — ABNORMAL LOW (ref 4.22–5.81)
RDW: 12.1 % (ref 11.5–15.5)
WBC: 12.2 10*3/uL — ABNORMAL HIGH (ref 4.0–10.5)

## 2014-09-29 LAB — POC OCCULT BLOOD, ED: Fecal Occult Bld: POSITIVE — AB

## 2014-09-29 LAB — BASIC METABOLIC PANEL
Anion gap: 7 (ref 5–15)
BUN: 76 mg/dL — AB (ref 6–20)
CHLORIDE: 105 mmol/L (ref 101–111)
CO2: 27 mmol/L (ref 22–32)
CREATININE: 2.33 mg/dL — AB (ref 0.61–1.24)
Calcium: 8 mg/dL — ABNORMAL LOW (ref 8.9–10.3)
GFR calc Af Amer: 32 mL/min — ABNORMAL LOW (ref >60)
GFR calc non Af Amer: 28 mL/min — ABNORMAL LOW (ref >60)
GLUCOSE: 173 mg/dL — AB (ref 65–99)
Potassium: 4.4 mmol/L (ref 3.5–5.1)
Sodium: 139 mmol/L (ref 135–145)

## 2014-09-29 LAB — PROTIME-INR
INR: 1.27 (ref 0.00–1.49)
PROTHROMBIN TIME: 16 s — AB (ref 11.6–15.2)

## 2014-09-29 LAB — BLOOD GAS, ARTERIAL
ACID-BASE DEFICIT: 3.1 mmol/L — AB (ref 0.0–2.0)
Bicarbonate: 21.1 mEq/L (ref 20.0–24.0)
DRAWN BY: 232811
O2 Content: 2.5 L/min
O2 SAT: 98.6 %
PATIENT TEMPERATURE: 97.9
TCO2: 20.2 mmol/L (ref 0–100)
pCO2 arterial: 35.9 mmHg (ref 35.0–45.0)
pH, Arterial: 7.385 (ref 7.350–7.450)
pO2, Arterial: 126 mmHg — ABNORMAL HIGH (ref 80.0–100.0)

## 2014-09-29 LAB — I-STAT CHEM 8, ED
BUN: 82 mg/dL — AB (ref 6–20)
Calcium, Ion: 0.98 mmol/L — ABNORMAL LOW (ref 1.13–1.30)
Chloride: 105 mmol/L (ref 101–111)
Creatinine, Ser: 2.6 mg/dL — ABNORMAL HIGH (ref 0.61–1.24)
GLUCOSE: 282 mg/dL — AB (ref 65–99)
HEMATOCRIT: 29 % — AB (ref 39.0–52.0)
HEMOGLOBIN: 9.9 g/dL — AB (ref 13.0–17.0)
Potassium: 4.3 mmol/L (ref 3.5–5.1)
SODIUM: 139 mmol/L (ref 135–145)
TCO2: 19 mmol/L (ref 0–100)

## 2014-09-29 LAB — HEPATIC FUNCTION PANEL
ALK PHOS: 43 U/L (ref 38–126)
ALT: 8 U/L — ABNORMAL LOW (ref 17–63)
AST: 9 U/L — ABNORMAL LOW (ref 15–41)
Albumin: 2.9 g/dL — ABNORMAL LOW (ref 3.5–5.0)
Total Bilirubin: 0.7 mg/dL (ref 0.3–1.2)
Total Protein: 5.7 g/dL — ABNORMAL LOW (ref 6.5–8.1)

## 2014-09-29 LAB — AMMONIA: AMMONIA: 74 umol/L — AB (ref 9–35)

## 2014-09-29 LAB — CK: CK TOTAL: 34 U/L — AB (ref 49–397)

## 2014-09-29 LAB — I-STAT CG4 LACTIC ACID, ED
LACTIC ACID, VENOUS: 1.28 mmol/L (ref 0.5–2.0)
Lactic Acid, Venous: 4.7 mmol/L (ref 0.5–2.0)

## 2014-09-29 MED ORDER — ETOMIDATE 2 MG/ML IV SOLN
INTRAVENOUS | Status: AC
  Filled 2014-09-29: qty 20

## 2014-09-29 MED ORDER — LIDOCAINE HCL (CARDIAC) 20 MG/ML IV SOLN
INTRAVENOUS | Status: AC
  Filled 2014-09-29: qty 5

## 2014-09-29 MED ORDER — SUCCINYLCHOLINE CHLORIDE 20 MG/ML IJ SOLN
INTRAMUSCULAR | Status: AC
  Filled 2014-09-29: qty 1

## 2014-09-29 MED ORDER — ROCURONIUM BROMIDE 50 MG/5ML IV SOLN
INTRAVENOUS | Status: AC
  Filled 2014-09-29: qty 2

## 2014-09-29 MED ORDER — OXYBUTYNIN 3.9 MG/24HR TD PTTW
1.0000 | MEDICATED_PATCH | TRANSDERMAL | Status: DC
  Administered 2014-09-29 – 2014-10-02 (×2): 1 via TRANSDERMAL
  Filled 2014-09-29 (×3): qty 1

## 2014-09-29 MED ORDER — PANTOPRAZOLE SODIUM 40 MG IV SOLR
40.0000 mg | Freq: Once | INTRAVENOUS | Status: AC
  Administered 2014-09-29: 40 mg via INTRAVENOUS
  Filled 2014-09-29: qty 40

## 2014-09-29 NOTE — ED Notes (Signed)
Per Amy in lab, new T & S must be collected

## 2014-09-29 NOTE — ED Provider Notes (Signed)
CSN: [id number]     Arrival date & time 09/29/14  1626 History   First MD Initiated Contact with Patient 09/29/14 1648     Chief Complaint  Patient presents with  . Abdominal Pain   HPI  David Lang is 64 year old male with PMHx of schizophrenia and DM presenting from his nursing home with 2 episodes of coffee ground emesis. Pt reports one episode of vomiting after eating this afternoon. He is also complaining of recent heartburn and "stomach pain all over". Pt takes aspirin daily but no other blood thinners. Pt is difficult to interview 2/2 to paranoid schizophrenia. Pt repeating that "they keep giving me these diagnoses to keep me here". Pt ignores questions or responds inappropriately. Pt's medical aide is present and states this is typical for him.  Past Medical History  Diagnosis Date  . Hypertension   . Diabetes mellitus without complication   . Mental disorder   . Schizophrenia    Past Surgical History  Procedure Laterality Date  . Hip arthroplasty Left 03/31/2012    Procedure: ARTHROPLASTY BIPOLAR HIP;  Surgeon: Mable Paris, MD;  Location: [facility];  Service: Orthopedics;  Laterality: Left;   Family History  Problem Relation Age of Onset  . Diabetes type II Mother   . Hypertension Father    Social History  Substance Use Topics  . Smoking status: Never Smoker   . Smokeless tobacco: Never Used  . Alcohol Use: No     Comment: not answering    Review of Systems  Constitutional: Negative for fever.  Cardiovascular: Negative for chest pain.  Gastrointestinal: Positive for vomiting and abdominal pain.  Neurological: Negative for headaches.   ROS difficult to obtain. Pt would ignore or inappropriately answer questions.   Allergies  Review of patient's allergies indicates no known allergies.  Home Medications   Prior to Admission medications   Medication Sig Start Date End Date Taking? Authorizing Provider  amLODipine (NORVASC) 10 MG tablet Take 1 tablet (10 mg  total) by mouth daily. 04/18/12  Yes Kela Millin, MD  aspirin EC 81 MG tablet Take 81 mg by mouth daily.   Yes Historical Provider, MD  atenolol (TENORMIN) 100 MG tablet Take 1 tablet (100 mg total) by mouth daily. 04/18/12  Yes Adeline Joselyn Glassman, MD  cetirizine (ZYRTEC) 10 MG tablet Take 10 mg by mouth daily as needed for allergies.    Yes Historical Provider, MD  clonazePAM (KLONOPIN) 0.5 MG tablet Take 0.5 tablets (0.25 mg total) by mouth 2 (two) times daily. Patient taking differently: Take 0.25 mg by mouth as directed. Take 0.25 mg scheduled once every evening, and may also take 0.25mg  once in the morning as needed for anxiety. 04/18/12  Yes Adeline Joselyn Glassman, MD  cloNIDine (CATAPRES - DOSED IN MG/24 HR) 0.3 mg/24hr Place 1 patch (0.3 mg total) onto the skin once a week. 04/18/12  Yes Adeline Joselyn Glassman, MD  haloperidol (HALDOL) 5 MG tablet Take 2.5-5 mg by mouth 2 (two) times daily. Take 2.5mg  in the morning and then take 5mg  daily at night   Yes Historical Provider, MD  haloperidol decanoate (HALDOL DECANOATE) 100 MG/ML injection Inject 100 mg into the muscle every 28 (twenty-eight) days.   Yes Historical Provider, MD  hydrALAZINE (APRESOLINE) 50 MG tablet Take 75 mg by mouth 3 (three) times daily.    Yes Historical Provider, MD  lisinopril (PRINIVIL,ZESTRIL) 20 MG tablet Take 20 mg by mouth daily.   Yes Historical Provider, MD  loperamide (IMODIUM) 2 MG capsule Take 2 mg by mouth 3 (three) times daily as needed for diarrhea or loose stools.   Yes Historical Provider, MD  morphine (MS CONTIN) 15 MG 12 hr tablet Take 1 tablet (15 mg total) by mouth every 12 (twelve) hours. Patient taking differently: Take 15 mg by mouth every 12 (twelve) hours as needed for pain.  08/17/13  Yes Stark Bray, MD  OLANZapine (ZYPREXA) 5 MG tablet Take 5 mg by mouth 2 (two) times daily. And may take 1 additional dose of  as needed for pychosis   Yes Historical Provider, MD  ondansetron (ZOFRAN) 4 MG tablet Take 1  tablet (4 mg total) by mouth every 6 (six) hours as needed for nausea. 04/18/12  Yes Adeline Joselyn Glassman, MD  ranitidine (ZANTAC) 150 MG tablet Take 150 mg by mouth 2 (two) times daily as needed for heartburn.   Yes Historical Provider, MD  spironolactone (ALDACTONE) 25 MG tablet Take 12.5 mg by mouth daily.   Yes Historical Provider, MD  tamsulosin (FLOMAX) 0.4 MG CAPS capsule Take 0.4 mg by mouth.   Yes Historical Provider, MD  trihexyphenidyl (ARTANE) 5 MG tablet Take 2.5 mg by mouth daily.    Yes Historical Provider, MD  vitamin B-12 1000 MCG tablet Take 1 tablet (1,000 mcg total) by mouth daily. 04/18/12  Yes Adeline Joselyn Glassman, MD  OYSTER SHELL CALCIUM PO Take 1 tablet by mouth daily.    Historical Provider, MD   BP 126/69 mmHg  Pulse 82  Temp(Src) 98.9 F (37.2 C) (Oral)  Resp 17  Ht  (1.88 m)  Wt 184 lb 1.4 oz (83.5 kg)  BMI 23.62 kg/m2  SpO2 99% Physical Exam  Constitutional: He is oriented to person, place, and time. He appears well-developed and well-nourished.  HENT:  Head: Normocephalic and atraumatic.  Eyes: Conjunctivae and EOM are normal. Pupils are equal, round, and reactive to light.  Neck: Normal range of motion.  Cardiovascular: Normal rate and regular rhythm.   Pulmonary/Chest: Effort normal and breath sounds normal. No respiratory distress. He has no wheezes.  Abdominal: Soft. He exhibits no distension. There is tenderness (generalized). There is no rebound and no guarding.  Musculoskeletal: Normal range of motion.  Neurological: He is alert and oriented to person, place, and time.  Skin: Skin is warm. He is diaphoretic. There is pallor.  Psychiatric: Thought content is paranoid.  Pt difficult to interview. Inappropriate responses to questions. Paranoid thought content.   Vitals reviewed.   ED Course  Procedures (including critical care time) Labs Review Labs Reviewed  CBC WITH DIFFERENTIAL/PLATELET - Abnormal; Notable for the following:    WBC 12.2 (*)     RBC 3.29 (*)    Hemoglobin 9.3 (*)    HCT 29.6 (*)    Monocytes Absolute 1.4 (*)    All other components within normal limits  BASIC METABOLIC PANEL - Abnormal; Notable for the following:    Glucose, Bld 173 (*)    BUN 76 (*)    Creatinine, Ser 2.33 (*)    Calcium 8.0 (*)    GFR calc non Af Amer 28 (*)    GFR calc Af Amer 32 (*)    All other components within normal limits  HEPATIC FUNCTION PANEL - Abnormal; Notable for the following:    Total Protein 5.7 (*)    Albumin 2.9 (*)    AST 9 (*)    ALT 8 (*)    Bilirubin, Direct <0.1 (*)  All other components within normal limits  PROTIME-INR - Abnormal; Notable for the following:    Prothrombin Time 16.0 (*)    All other components within normal limits  AMMONIA - Abnormal; Notable for the following:    Ammonia 74 (*)    All other components within normal limits  BLOOD GAS, ARTERIAL - Abnormal; Notable for the following:    pO2, Arterial 126 (*)    Acid-base deficit 3.1 (*)    All other components within normal limits  CK - Abnormal; Notable for the following:    Total CK 34 (*)    All other components within normal limits  IRON AND TIBC - Abnormal; Notable for the following:    TIBC 172 (*)    Saturation Ratios 44 (*)    All other components within normal limits  RETICULOCYTES - Abnormal; Notable for the following:    RBC. 2.57 (*)    All other components within normal limits  MAGNESIUM - Abnormal; Notable for the following:    Magnesium 1.6 (*)    All other components within normal limits  COMPREHENSIVE METABOLIC PANEL - Abnormal; Notable for the following:    Chloride 115 (*)    CO2 21 (*)    Glucose, Bld 136 (*)    BUN 106 (*)    Creatinine, Ser 2.37 (*)    Calcium 7.6 (*)    Total Protein 5.0 (*)    Albumin 2.7 (*)    AST 7 (*)    ALT 6 (*)    Alkaline Phosphatase 32 (*)    Total Bilirubin 0.2 (*)    GFR calc non Af Amer 27 (*)    GFR calc Af Amer 32 (*)    All other components within normal limits  CBC -  Abnormal; Notable for the following:    WBC 12.8 (*)    RBC 2.62 (*)    Hemoglobin 7.6 (*)    HCT 23.9 (*)    All other components within normal limits  CBC - Abnormal; Notable for the following:    RBC 2.31 (*)    Hemoglobin 6.8 (*)    HCT 21.0 (*)    All other components within normal limits  PROTIME-INR - Abnormal; Notable for the following:    Prothrombin Time 16.7 (*)    All other components within normal limits  GLUCOSE, CAPILLARY - Abnormal; Notable for the following:    Glucose-Capillary 134 (*)    All other components within normal limits  GLUCOSE, CAPILLARY - Abnormal; Notable for the following:    Glucose-Capillary 118 (*)    All other components within normal limits  GLUCOSE, CAPILLARY - Abnormal; Notable for the following:    Glucose-Capillary 121 (*)    All other components within normal limits  POC OCCULT BLOOD, ED - Abnormal; Notable for the following:    Fecal Occult Bld POSITIVE (*)    All other components within normal limits  I-STAT CHEM 8, ED - Abnormal; Notable for the following:    BUN 82 (*)    Creatinine, Ser 2.60 (*)    Glucose, Bld 282 (*)    Calcium, Ion 0.98 (*)    Hemoglobin 9.9 (*)    HCT 29.0 (*)    All other components within normal limits  I-STAT CG4 LACTIC ACID, ED - Abnormal; Notable for the following:    Lactic Acid, Venous 4.70 (*)    All other components within normal limits  MRSA PCR SCREENING  URINE CULTURE  VITAMIN B12  FOLATE  FERRITIN  PHOSPHORUS  TSH  LACTIC ACID, PLASMA  LACTIC ACID, PLASMA  HEPATITIS PANEL, ACUTE  URINALYSIS, ROUTINE W REFLEX MICROSCOPIC (NOT AT [facility])  HEMOGLOBIN A1C  CBC  CBC  AMMONIA  TROPONIN I  TROPONIN I  TROPONIN I  I-STAT CG4 LACTIC ACID, ED  I-STAT CG4 LACTIC ACID, ED  TYPE AND SCREEN  ABO/RH  PREPARE RBC (CROSSMATCH)    Imaging Review Dg Chest 2 View  09/29/2014   CLINICAL DATA:  Hematemesis.  EXAM: CHEST  2 VIEW  COMPARISON:  08/19/2013  FINDINGS: Cardiac enlargement unchanged.  Negative for heart failure. Left lower lobe atelectasis.  Elevated right hemidiaphragm with mild right lower lobe atelectasis or scarring, unchanged from the prior study.  Negative for pneumonia or mass lesion.  IMPRESSION: Cardiac enlargement with mild left lower lobe atelectasis.  Elevated right hemidiaphragm with mild right lower lobe chronic atelectasis or scarring.   Electronically Signed   By: Marlan Palau M.D.   On: 09/29/2014 21:52   US Abdomen Limited Ruq  09/30/2014   CLINICAL DATA:  Elevated ammonia. History of hypertension and diabetes.  EXAM: US ABDOMEN LIMITED - RIGHT UPPER QUADRANT  COMPARISON:  None.  FINDINGS: Gallbladder:  No gallstones or wall thickening visualized. No sonographic Murphy sign noted.  Common bile duct:  Diameter: 3.7 mm  Liver:  Mildly echogenic.  Two right renal cysts are noted. The largest is arising from the upper pole, measuring 6.5 cm in maximum diameter. There is also a small right pleural effusion.  IMPRESSION: 1. Mildly echogenic liver, most likely due to steatosis. 2. Small right pleural effusion. 3. Right renal cysts.   Electronically Signed   By: Beckie Salts M.D.   On: 09/30/2014 12:37   I have personally reviewed and evaluated these images and lab results as part of my medical decision-making.   EKG Interpretation None      MDM   Final diagnoses:  Gastrointestinal hemorrhage, unspecified gastritis, unspecified gastrointestinal hemorrhage type    Pt presenting from his nursing home with two episodes of hematemesis. Pt difficult to interview 2/2 to schizophrenia. No GI history, pt takes daily aspirin. Pt does endorse heartburn and generalized stomach pain. Pt had large bowel movement after initial interview and became diaphoretic, pale and altered. IVF and protonix IV with bed placed in trendelenburg. Pt improving and appears back to baseline but still pale. Hemoccult positive. Hgb 9.3. Lactic acid 4.7. Consult to [facility] GI on call who recommend  admission and they will see him in the morning. [facility] hospitalist down to see pt and admit.     Alveta Heimlich, PA-C 09/30/14 1304  Marily Memos, MD 10/02/14 1414

## 2014-09-29 NOTE — ED Notes (Signed)
Bed: WA24 Expected date:  Expected time:  Means of arrival:  Comments: EMS 

## 2014-09-29 NOTE — ED Notes (Signed)
Patient from [facility] memory unit vomited coffee ground emesis today.

## 2014-09-29 NOTE — ED Provider Notes (Signed)
Medical screening examination/treatment/procedure(s) were conducted as a shared visit with non-physician practitioner(s) and myself.  I personally evaluated the patient during the encounter.  64 year old male history of schizophrenia presents to the emergency department for abdominal pain. Also had one episode of hematemesis prior to arrival. Initially patient appeared well was cooperative with history. Patient had an acute change in mental status when he had 2 large Hemoccult positive bowel movements. He became diaphoretic and hypotensive pale and decreased responsiveness. Patient was immediately placed in Trendelenburg 2 large bore IVs were put in an 2 fluid boluses were started, type and cross were sent. Patient was moved to resuscitation room however over the next 1-2 hours his mental status slowly improved and was continued to protect his airway whole time. Concerned that the patient may have had an acute bleed at that time versus a vagal type episode. GI was consulted and will see the patient as an inpatient. Otherwise patient was admitted to hospitalist service.   CRITICAL CARE Performed by: Marily Memos   Total critical care time: 45 minutes   Critical care time was exclusive of separately billable procedures and treating other patients.  Critical care was necessary to treat or prevent imminent or life-threatening deterioration.  Critical care was time spent personally by me on the following activities: development of treatment plan with patient and/or surrogate as well as nursing, discussions with consultants, evaluation of patient's response to treatment, examination of patient, obtaining history from patient or surrogate, ordering and performing treatments and interventions, ordering and review of laboratory studies, ordering and review of radiographic studies, pulse oximetry and re-evaluation of patient's condition. c   EKG Interpretation   Date/Time:  Saturday September 29 2014  18:57:46 EDT Ventricular Rate:  77 PR Interval:  167 QRS Duration: 86 QT Interval:  395 QTC Calculation: 447 R Axis:   27 Text Interpretation:  Sinus rhythm ED PHYSICIAN INTERPRETATION AVAILABLE  IN [facility] Confirmed by TEST, Record ([id number]) on 09/30/2014 1:14:51  PM        Marily Memos, MD 10/01/14 2253

## 2014-09-29 NOTE — H&P (Signed)
PCP:  Default, Provider, MD    Referring provider Stevi   Chief Complaint:  hematemesis  HPI: David Lang is a 64 y.o. male   has a past medical history of Hypertension; Diabetes mellitus without complication; Mental disorder; and Schizophrenia.   Presented from [facility] memory unit with hematemesis of coffee-ground emesis. Patient has been reporting some heartburn and epigastric pain for some time. He is on daily aspirin. Patient has history of paranoid schizophrenia and difficult to obtain history.    Emergency department patient was noted to be pale and diaphoretic and encephalopathic. He had large loose BM and became diaphoretic and pale. For some time patient was poorly responsive but currently back to baseline.  ER provider has spoken to GI who recommended admission to medicine with [facility] consult in the morning Patient has history of paranoid schizophrenia. One year ago he was admitted with encephalopathy and presumed aspiration pneumonia. Patient has history of chronic kidney disease with baseline creatinine around 2.   Hospitalist was called for admission for hematemesis   Review of Systems:    Pertinent positives include: Confusion and hematemesis, heartburn, indigestion, abdominal pain, nausea, vomiting, diarrhea,  Constitutional:  No weight loss, night sweats, Fevers, chills, fatigue, weight loss  HEENT:  No headaches, Difficulty swallowing,Tooth/dental problems,Sore throat,  No sneezing, itching, ear ache, nasal congestion, post nasal drip,  Cardio-vascular:  No chest pain, Orthopnea, PND, anasarca, dizziness, palpitations.no Bilateral lower extremity swelling  GI:  No  change in bowel habits, loss of appetite, melena, blood in stool, hematemesis Resp:  no shortness of breath at rest. No dyspnea on exertion, No excess mucus, no productive cough, No non-productive cough, No coughing up of blood.No change in color of mucus.No wheezing. Skin:  no rash or  lesions. No jaundice GU:  no dysuria, change in color of urine, no urgency or frequency. No straining to urinate.  No flank pain.  Musculoskeletal:  No joint pain or no joint swelling. No decreased range of motion. No back pain.  Psych:  No change in mood or affect. No depression or anxiety. No memory loss.  Neuro: no localizing neurological complaints, no tingling, no weakness, no double vision, no gait abnormality, no slurred speech, no confusion  Otherwise ROS are negative except for above, 10 systems were reviewed  Past Medical History: Past Medical History  Diagnosis Date  . Hypertension   . Diabetes mellitus without complication   . Mental disorder   . Schizophrenia    Past Surgical History  Procedure Laterality Date  . Hip arthroplasty Left 03/31/2012    Procedure: ARTHROPLASTY BIPOLAR HIP;  Surgeon: Mable Paris, MD;  Location: [facility] ORS;  Service: Orthopedics;  Laterality: Left;     Medications: Prior to Admission medications   Medication Sig Start Date End Date Taking? Authorizing Provider  amLODipine (NORVASC) 10 MG tablet Take 1 tablet (10 mg total) by mouth daily. 04/18/12  Yes Kela Millin, MD  aspirin EC 81 MG tablet Take 81 mg by mouth daily.   Yes Historical Provider, MD  atenolol (TENORMIN) 100 MG tablet Take 1 tablet (100 mg total) by mouth daily. 04/18/12  Yes Adeline Joselyn Glassman, MD  cetirizine (ZYRTEC) 10 MG tablet Take 10 mg by mouth daily as needed for allergies.    Yes Historical Provider, MD  clonazePAM (KLONOPIN) 0.5 MG tablet Take 0.5 tablets (0.25 mg total) by mouth 2 (two) times daily. Patient taking differently: Take 0.25 mg by mouth as directed. Take 0.25 mg scheduled  once every evening, and may also take 0.25mg  once in the morning as needed for anxiety. 04/18/12  Yes Adeline Joselyn Glassman, MD  cloNIDine (CATAPRES - DOSED IN MG/24 HR) 0.3 mg/24hr Place 1 patch (0.3 mg total) onto the skin once a week. 04/18/12  Yes Adeline Joselyn Glassman, MD  haloperidol  (HALDOL) 5 MG tablet Take 2.5-5 mg by mouth 2 (two) times daily. Take 2.5mg  in the morning and then take 5mg  daily at night   Yes Historical Provider, MD  haloperidol decanoate (HALDOL DECANOATE) 100 MG/ML injection Inject 100 mg into the muscle every 28 (twenty-eight) days.   Yes Historical Provider, MD  hydrALAZINE (APRESOLINE) 50 MG tablet Take 75 mg by mouth 3 (three) times daily.    Yes Historical Provider, MD  lisinopril (PRINIVIL,ZESTRIL) 20 MG tablet Take 20 mg by mouth daily.   Yes Historical Provider, MD  loperamide (IMODIUM) 2 MG capsule Take 2 mg by mouth 3 (three) times daily as needed for diarrhea or loose stools.   Yes Historical Provider, MD  morphine (MS CONTIN) 15 MG 12 hr tablet Take 1 tablet (15 mg total) by mouth every 12 (twelve) hours. Patient taking differently: Take 15 mg by mouth every 12 (twelve) hours as needed for pain.  08/17/13  Yes Stark Bray, MD  OLANZapine (ZYPREXA) 5 MG tablet Take 5 mg by mouth 2 (two) times daily.   Yes Historical Provider, MD  ondansetron (ZOFRAN) 4 MG tablet Take 1 tablet (4 mg total) by mouth every 6 (six) hours as needed for nausea. 04/18/12  Yes Adeline Joselyn Glassman, MD  ranitidine (ZANTAC) 150 MG tablet Take 150 mg by mouth 2 (two) times daily as needed for heartburn.   Yes Historical Provider, MD  spironolactone (ALDACTONE) 25 MG tablet Take 12.5 mg by mouth daily.   Yes Historical Provider, MD  tamsulosin (FLOMAX) 0.4 MG CAPS capsule Take 0.4 mg by mouth.   Yes Historical Provider, MD  trihexyphenidyl (ARTANE) 5 MG tablet Take 2.5 mg by mouth daily.    Yes Historical Provider, MD  vitamin B-12 1000 MCG tablet Take 1 tablet (1,000 mcg total) by mouth daily. 04/18/12  Yes Adeline Joselyn Glassman, MD  OYSTER SHELL CALCIUM PO Take 1 tablet by mouth daily.    Historical Provider, MD  polyethylene glycol (MIRALAX / GLYCOLAX) packet Take 17 g by mouth daily as needed for mild constipation. 04/18/12   Kela Millin, MD    Allergies:  No Known  Allergies  Social History:  Ambulatory  [place],  From facility [facility]   reports that he has never smoked. He has never used smokeless tobacco. He reports that he does not drink alcohol or use illicit drugs.    Family History: family history includes Diabetes type II in his mother; Hypertension in his father.    Physical Exam: Patient Vitals for the past 24 hrs:  BP Temp Temp src Pulse Resp SpO2  09/29/14 2124 110/70 mmHg - - - - -  09/29/14 2105 159/91 mmHg - - 72 14 96 %  09/29/14 2100 116/63 mmHg - - 76 14 100 %  09/29/14 2055 128/60 mmHg - - 75 14 100 %  09/29/14 2050 112/61 mmHg - - 75 17 100 %  09/29/14 2045 106/57 mmHg - - 71 12 100 %  09/29/14 2044 100/62 mmHg - - - - -  09/29/14 2030 (!) 97/53 mmHg - - 67 14 99 %  09/29/14 2015 100/61 mmHg - - 69 13 100 %  09/29/14 1945 99/60 mmHg - - 74 14 97 %  09/29/14 1925 (!) 108/53 mmHg - - 73 18 97 %  09/29/14 1920 (!) 102/54 mmHg - - 72 18 98 %  09/29/14 1915 (!) 99/52 mmHg - - 73 20 97 %  09/29/14 1910 99/58 mmHg - - 74 19 99 %  09/29/14 1900 131/63 mmHg - - 78 20 99 %  09/29/14 1854 131/63 mmHg - - - - -  09/29/14 1651 110/68 mmHg 98.8 F (37.1 C) Oral 73 16 97 %    1. General:  in No Acute distress 2. Psychological: Somnolent not fully Oriented 3. Head/ENT:    Dry Mucous Membranes                          Head Non traumatic, neck supple                          Normal   Dentition 4. SKIN:  decreased Skin turgor,  Skin clean Dry and intact no rash pale 5. Heart: Regular rate and rhythm no Murmur, Rub or gallop 6. Lungs: Clear to auscultation bilaterally, no wheezes or crackles   7. Abdomen: Soft, non-tender, Non distended 8. Lower extremities: no clubbing, cyanosis, or edema 9. Neurologically Grossly intact, moving all 4 extremities equally 10. MSK: Normal range of motion  Hemoccult positive  body mass index is unknown because there is no weight on file.   Labs on Admission:   Results for orders  placed or performed during the hospital encounter of 09/29/14 (from the past 24 hour(s))  CBC with Differential     Status: Abnormal   Collection Time: 09/29/14  5:50 PM  Result Value Ref Range   WBC 12.2 (H) 4.0 - 10.5 K/uL   RBC 3.29 (L) 4.22 - 5.81 MIL/uL   Hemoglobin 9.3 (L) 13.0 - 17.0 g/dL   HCT 56.2 (L) 13.0 - 86.5 %   MCV 90.0 78.0 - 100.0 fL   MCH 28.3 26.0 - 34.0 pg   MCHC 31.4 30.0 - 36.0 g/dL   RDW 78.4 69.6 - 29.5 %   Platelets 352 150 - 400 K/uL   Neutrophils Relative % 61 43 - 77 %   Neutro Abs 7.5 1.7 - 7.7 K/uL   Lymphocytes Relative 27 12 - 46 %   Lymphs Abs 3.2 0.7 - 4.0 K/uL   Monocytes Relative 12 3 - 12 %   Monocytes Absolute 1.4 (H) 0.1 - 1.0 K/uL   Eosinophils Relative 0 0 - 5 %   Eosinophils Absolute 0.0 0.0 - 0.7 K/uL   Basophils Relative 0 0 - 1 %   Basophils Absolute 0.0 0.0 - 0.1 K/uL  Basic metabolic panel     Status: Abnormal   Collection Time: 09/29/14  5:50 PM  Result Value Ref Range   Sodium 139 135 - 145 mmol/L   Potassium 4.4 3.5 - 5.1 mmol/L   Chloride 105 101 - 111 mmol/L   CO2 27 22 - 32 mmol/L   Glucose, Bld 173 (H) 65 - 99 mg/dL   BUN 76 (H) 6 - 20 mg/dL   Creatinine, Ser 2.84 (H) 0.61 - 1.24 mg/dL   Calcium 8.0 (L) 8.9 - 10.3 mg/dL   GFR calc non Af Amer 28 (L) >60 mL/min   GFR calc Af Amer 32 (L) >60 mL/min   Anion gap 7 5 - 15  Hepatic function panel  Status: Abnormal   Collection Time: 09/29/14  5:50 PM  Result Value Ref Range   Total Protein 5.7 (L) 6.5 - 8.1 g/dL   Albumin 2.9 (L) 3.5 - 5.0 g/dL   AST 9 (L) 15 - 41 U/L   ALT 8 (L) 17 - 63 U/L   Alkaline Phosphatase 43 38 - 126 U/L   Total Bilirubin 0.7 0.3 - 1.2 mg/dL   Bilirubin, Direct <1.6 (L) 0.1 - 0.5 mg/dL   Indirect Bilirubin NOT CALCULATED 0.3 - 0.9 mg/dL  Protime-INR     Status: Abnormal   Collection Time: 09/29/14  5:50 PM  Result Value Ref Range   Prothrombin Time 16.0 (H) 11.6 - 15.2 seconds   INR 1.27 0.00 - 1.49  Ammonia     Status: Abnormal    Collection Time: 09/29/14  7:03 PM  Result Value Ref Range   Ammonia 74 (H) 9 - 35 umol/L  POC occult blood, ED     Status: Abnormal   Collection Time: 09/29/14  7:12 PM  Result Value Ref Range   Fecal Occult Bld POSITIVE (A) NEGATIVE  I-Stat CG4 Lactic Acid, ED     Status: Abnormal   Collection Time: 09/29/14  7:39 PM  Result Value Ref Range   Lactic Acid, Venous 4.70 (HH) 0.5 - 2.0 mmol/L   Comment NOTIFIED PHYSICIAN   I-stat chem 8, ed     Status: Abnormal   Collection Time: 09/29/14  7:40 PM  Result Value Ref Range   Sodium 139 135 - 145 mmol/L   Potassium 4.3 3.5 - 5.1 mmol/L   Chloride 105 101 - 111 mmol/L   BUN 82 (H) 6 - 20 mg/dL   Creatinine, Ser 1.09 (H) 0.61 - 1.24 mg/dL   Glucose, Bld 604 (H) 65 - 99 mg/dL   Calcium, Ion 5.40 (L) 1.13 - 1.30 mmol/L   TCO2 19 0 - 100 mmol/L   Hemoglobin 9.9 (L) 13.0 - 17.0 g/dL   HCT 98.1 (L) 19.1 - 47.8 %  Type and screen     Status: None   Collection Time: 09/29/14  8:15 PM  Result Value Ref Range   ABO/RH(D) A POS    Antibody Screen NEG    Sample Expiration 10/02/2014     UA ordered  Lab Results  Component Value Date   HGBA1C 8.8* 03/22/2012    CrCl cannot be calculated (Unknown ideal weight.).  BNP (last 3 results) No results for input(s): PROBNP in the last 8760 hours.  Other results:  I have pearsonaly reviewed this: ECG REPORT  Rate:77   Rhythm:  sinusRhythm ST&T Change: No ischemic changes QTC 447  There were no vitals filed for this visit.   Cultures:    Component Value Date/Time   SDES URINE, CATHETERIZED 08/19/2013 0402   SPECREQUEST NONE 08/19/2013 0402   CULT NO GROWTH Performed at [facility] 08/19/2013 0402   REPTSTATUS 08/20/2013 FINAL 08/19/2013 0402     Radiological Exams on Admission: Dg Chest 2 View  09/29/2014   CLINICAL DATA:  Hematemesis.  EXAM: CHEST  2 VIEW  COMPARISON:  08/19/2013  FINDINGS: Cardiac enlargement unchanged. Negative for heart failure. Left lower lobe  atelectasis.  Elevated right hemidiaphragm with mild right lower lobe atelectasis or scarring, unchanged from the prior study.  Negative for pneumonia or mass lesion.  IMPRESSION: Cardiac enlargement with mild left lower lobe atelectasis.  Elevated right hemidiaphragm with mild right lower lobe chronic atelectasis or scarring.   Electronically Signed  By: Marlan Palau M.D.   On: 09/29/2014 21:52    Chart has been reviewed  Family not  at  Bedside Patient friend is at bedside,   Assessment/Plan  89 year old gentleman with history of paranoid schizophrenia presents with hematemesis at nursing home followed by episode of diaphoresis and hypotension while in emergency department after the large volume loose bowel movement. Heme occult positive from below  Present on Admission:  . Acute encephalopathy - likely multifactorial. Currently improving after blood pressure has stabilized. Patient was noted to have elevated ammonia level. No prior history of liver disease. Although in the past he have had transiently elevated LFTs while he was diagnosed with rhabdomyolysis currently LFTs within normal limits  . DM type 2 causing CKD stage 3 - chronic will order sliding scale  . Chronic anemia - will order an anemia panel, transfuse as needed for hemoglobin below 7 was significant bleeding, currently no more episodes of hematemesis  . Schizophrenia, paranoid type - continue home medications including Haldol and Zyprexa  . Hematemesis - will order Protonix drip admit to step down serial CBC GI is aware we'll continue morning. So far no more episodes of hematemesis. Vital signs stabilizing we'll fluid resuscitate  . Hyperammonemia - unclear etiology, no known liver disease. Mental status currently spontaneously improving after administration of IV fluids. Given elevated loose bowel movements will hold off on lactulose and monitor clinically repeat ammonia level in the morning Elevated lactic acid in the  setting of hypotension after large bowel movement currently blood pressure improved after IV fluid administration we'll repeat a lactic acid. No fever no evidence of infection will check urine. Cardiomegaly will obtain echogram monitor on telemetry  Prophylaxis: SCD   CODE STATUS:  FULL CODE  as per patient   Disposition:                            Back to current facility when stable                             Other plan as per orders.  I have spent a total of 65 min on this admission spoke to PCCM  Heddy Vidana 09/29/2014, 9:36 PM  [facility]  Pager [phone]   after 2 AM please page floor coverage PA If 7AM-7PM, please contact the day team taking care of the patient  [email or web address]  Password [email or web address]

## 2014-09-30 ENCOUNTER — Encounter (HOSPITAL_COMMUNITY)
Admission: EM | Disposition: A | Discharge status: Nursing Home (Includes ICF) | Payer: Self-pay | Source: Home / Self Care | Attending: Internal Medicine

## 2014-09-30 ENCOUNTER — Encounter (HOSPITAL_COMMUNITY): Payer: Self-pay | Admitting: Gastroenterology

## 2014-09-30 ENCOUNTER — Inpatient Hospital Stay (HOSPITAL_COMMUNITY): Payer: BLUE CROSS/BLUE SHIELD | Admitting: Anesthesiology

## 2014-09-30 ENCOUNTER — Inpatient Hospital Stay (HOSPITAL_COMMUNITY): Payer: BLUE CROSS/BLUE SHIELD

## 2014-09-30 HISTORY — PX: ESOPHAGOGASTRODUODENOSCOPY (EGD) WITH PROPOFOL: SHX5813

## 2014-09-30 LAB — COMPREHENSIVE METABOLIC PANEL
ALBUMIN: 2.7 g/dL — AB (ref 3.5–5.0)
ALK PHOS: 32 U/L — AB (ref 38–126)
ALT: 6 U/L — AB (ref 17–63)
AST: 7 U/L — ABNORMAL LOW (ref 15–41)
Anion gap: 7 (ref 5–15)
BILIRUBIN TOTAL: 0.2 mg/dL — AB (ref 0.3–1.2)
BUN: 106 mg/dL — ABNORMAL HIGH (ref 6–20)
CALCIUM: 7.6 mg/dL — AB (ref 8.9–10.3)
CO2: 21 mmol/L — AB (ref 22–32)
CREATININE: 2.37 mg/dL — AB (ref 0.61–1.24)
Chloride: 115 mmol/L — ABNORMAL HIGH (ref 101–111)
GFR calc non Af Amer: 27 mL/min — ABNORMAL LOW (ref >60)
GFR, EST AFRICAN AMERICAN: 32 mL/min — AB (ref >60)
GLUCOSE: 136 mg/dL — AB (ref 65–99)
Potassium: 4.8 mmol/L (ref 3.5–5.1)
SODIUM: 143 mmol/L (ref 135–145)
TOTAL PROTEIN: 5 g/dL — AB (ref 6.5–8.1)

## 2014-09-30 LAB — RETICULOCYTES
RBC.: 2.57 MIL/uL — AB (ref 4.22–5.81)
RETIC COUNT ABSOLUTE: 33.4 10*3/uL (ref 19.0–186.0)
RETIC CT PCT: 1.3 % (ref 0.4–3.1)

## 2014-09-30 LAB — CBC
HCT: 23.9 % — ABNORMAL LOW (ref 39.0–52.0)
HCT: 21 % — ABNORMAL LOW (ref 39.0–52.0)
HEMATOCRIT: 31.8 % — AB (ref 39.0–52.0)
Hemoglobin: 7.6 g/dL — ABNORMAL LOW (ref 13.0–17.0)
Hemoglobin: 6.8 g/dL — CL (ref 13.0–17.0)
Hemoglobin: 10.5 g/dL — ABNORMAL LOW (ref 13.0–17.0)
MCH: 29 pg (ref 26.0–34.0)
MCH: 29.4 pg (ref 26.0–34.0)
MCH: 30.1 pg (ref 26.0–34.0)
MCHC: 31.8 g/dL (ref 30.0–36.0)
MCHC: 32.4 g/dL (ref 30.0–36.0)
MCHC: 33 g/dL (ref 30.0–36.0)
MCV: 91.2 fL (ref 78.0–100.0)
MCV: 90.9 fL (ref 78.0–100.0)
MCV: 91.1 fL (ref 78.0–100.0)
PLATELETS: 329 10*3/uL (ref 150–400)
PLATELETS: 269 10*3/uL (ref 150–400)
PLATELETS: 273 10*3/uL (ref 150–400)
RBC: 2.62 MIL/uL — ABNORMAL LOW (ref 4.22–5.81)
RBC: 2.31 MIL/uL — AB (ref 4.22–5.81)
RBC: 3.49 MIL/uL — AB (ref 4.22–5.81)
RDW: 12.2 % (ref 11.5–15.5)
RDW: 12.2 % (ref 11.5–15.5)
RDW: 13.9 % (ref 11.5–15.5)
WBC: 12.8 10*3/uL — ABNORMAL HIGH (ref 4.0–10.5)
WBC: 9.5 10*3/uL (ref 4.0–10.5)
WBC: 11.4 10*3/uL — AB (ref 4.0–10.5)

## 2014-09-30 LAB — IRON AND TIBC
Iron: 76 ug/dL (ref 45–182)
SATURATION RATIOS: 44 % — AB (ref 17.9–39.5)
TIBC: 172 ug/dL — ABNORMAL LOW (ref 250–450)
UIBC: 96 ug/dL

## 2014-09-30 LAB — LACTIC ACID, PLASMA
Lactic Acid, Venous: 0.7 mmol/L (ref 0.5–2.0)
Lactic Acid, Venous: 0.9 mmol/L (ref 0.5–2.0)

## 2014-09-30 LAB — GLUCOSE, CAPILLARY
GLUCOSE-CAPILLARY: 216 mg/dL — AB (ref 65–99)
Glucose-Capillary: 134 mg/dL — ABNORMAL HIGH (ref 65–99)
Glucose-Capillary: 118 mg/dL — ABNORMAL HIGH (ref 65–99)
Glucose-Capillary: 121 mg/dL — ABNORMAL HIGH (ref 65–99)
Glucose-Capillary: 125 mg/dL — ABNORMAL HIGH (ref 65–99)

## 2014-09-30 LAB — PROTIME-INR
INR: 1.34 (ref 0.00–1.49)
PROTHROMBIN TIME: 16.7 s — AB (ref 11.6–15.2)

## 2014-09-30 LAB — VITAMIN B12: VITAMIN B 12: 439 pg/mL (ref 180–914)

## 2014-09-30 LAB — PHOSPHORUS: Phosphorus: 2.6 mg/dL (ref 2.5–4.6)

## 2014-09-30 LAB — TSH: TSH: 0.361 u[IU]/mL (ref 0.350–4.500)

## 2014-09-30 LAB — AMMONIA: AMMONIA: 53 umol/L — AB (ref 9–35)

## 2014-09-30 LAB — TROPONIN I: Troponin I: <0.03 ng/mL (ref <0.031)

## 2014-09-30 LAB — FERRITIN: Ferritin: 45 ng/mL (ref 24–336)

## 2014-09-30 LAB — FOLATE: FOLATE: 8 ng/mL (ref >5.9)

## 2014-09-30 LAB — MAGNESIUM: MAGNESIUM: 1.6 mg/dL — AB (ref 1.7–2.4)

## 2014-09-30 LAB — MRSA PCR SCREENING: MRSA BY PCR: NEGATIVE

## 2014-09-30 LAB — PREPARE RBC (CROSSMATCH)

## 2014-09-30 LAB — ABO/RH: ABO/RH(D): A POS

## 2014-09-30 SURGERY — ESOPHAGOGASTRODUODENOSCOPY (EGD) WITH PROPOFOL
Anesthesia: Monitor Anesthesia Care

## 2014-09-30 MED ORDER — ONDANSETRON HCL 4 MG PO TABS: 4.0000 mg | ORAL_TABLET | Freq: Four times a day (QID) | ORAL | Status: DC | PRN

## 2014-09-30 MED ORDER — SODIUM CHLORIDE 0.9 % IV SOLN
INTRAVENOUS | Status: DC
Start: 2014-09-30 — End: 2014-09-30

## 2014-09-30 MED ORDER — SODIUM CHLORIDE 0.9 % IV SOLN
8.0000 mg/h | INTRAVENOUS | Status: AC
  Administered 2014-09-30 – 2014-10-02 (×7): 8 mg/h via INTRAVENOUS
  Filled 2014-09-30 (×14): qty 80

## 2014-09-30 MED ORDER — CETYLPYRIDINIUM CHLORIDE 0.05 % MT LIQD
7.0000 mL | Freq: Two times a day (BID) | OROMUCOSAL | Status: DC
  Administered 2014-10-01: 7 mL via OROMUCOSAL

## 2014-09-30 MED ORDER — HALOPERIDOL 2 MG PO TABS
2.5000 mg | ORAL_TABLET | Freq: Every day | ORAL | Status: DC
  Administered 2014-09-30 – 2014-10-03 (×4): 2.5 mg via ORAL
  Filled 2014-09-30 (×4): qty 1

## 2014-09-30 MED ORDER — MORPHINE SULFATE (PF) 2 MG/ML IV SOLN: 2.0000 mg | INTRAVENOUS | Status: DC | PRN

## 2014-09-30 MED ORDER — PANTOPRAZOLE SODIUM 40 MG IV SOLR
40.0000 mg | Freq: Once | INTRAVENOUS | Status: AC
  Administered 2014-09-30: 40 mg via INTRAVENOUS
  Filled 2014-09-30: qty 40

## 2014-09-30 MED ORDER — SODIUM CHLORIDE 0.9 % IV SOLN
INTRAVENOUS | Status: DC
  Administered 2014-09-30: 18:00:00 via INTRAVENOUS
  Administered 2014-10-01: 75 mL/h via INTRAVENOUS
  Administered 2014-10-01: 08:00:00 via INTRAVENOUS

## 2014-09-30 MED ORDER — PROPOFOL 10 MG/ML IV BOLUS
INTRAVENOUS | Status: AC
  Filled 2014-09-30: qty 20

## 2014-09-30 MED ORDER — SODIUM CHLORIDE 0.9 % IJ SOLN
3.0000 mL | Freq: Two times a day (BID) | INTRAMUSCULAR | Status: DC
  Administered 2014-09-30 (×3): 3 mL via INTRAVENOUS

## 2014-09-30 MED ORDER — PANTOPRAZOLE SODIUM 40 MG IV SOLR
40.0000 mg | Freq: Two times a day (BID) | INTRAVENOUS | Status: DC
  Filled 2014-09-30 (×2): qty 40

## 2014-09-30 MED ORDER — CHLORHEXIDINE GLUCONATE 0.12 % MT SOLN
15.0000 mL | Freq: Two times a day (BID) | OROMUCOSAL | Status: DC
  Administered 2014-10-01 – 2014-10-03 (×3): 15 mL via OROMUCOSAL
  Filled 2014-09-30 (×6): qty 15

## 2014-09-30 MED ORDER — LORAZEPAM 2 MG/ML IJ SOLN
0.2500 mg | Freq: Once | INTRAMUSCULAR | Status: AC
  Administered 2014-09-30: 0.25 mg via INTRAVENOUS
  Filled 2014-09-30: qty 1

## 2014-09-30 MED ORDER — INSULIN ASPART 100 UNIT/ML ~~LOC~~ SOLN
0.0000 [IU] | SUBCUTANEOUS | Status: DC
  Administered 2014-09-30: 3 [IU] via SUBCUTANEOUS
  Administered 2014-09-30: 1 [IU] via SUBCUTANEOUS
  Administered 2014-09-30: 3 [IU] via SUBCUTANEOUS
  Administered 2014-09-30 – 2014-10-01 (×4): 1 [IU] via SUBCUTANEOUS

## 2014-09-30 MED ORDER — OLANZAPINE 5 MG PO TABS
5.0000 mg | ORAL_TABLET | Freq: Two times a day (BID) | ORAL | Status: DC
  Administered 2014-09-30 – 2014-10-03 (×7): 5 mg via ORAL
  Filled 2014-09-30 (×11): qty 1

## 2014-09-30 MED ORDER — TAMSULOSIN HCL 0.4 MG PO CAPS
0.4000 mg | ORAL_CAPSULE | Freq: Every day | ORAL | Status: DC
  Administered 2014-09-30 – 2014-10-03 (×4): 0.4 mg via ORAL
  Filled 2014-09-30 (×4): qty 1

## 2014-09-30 MED ORDER — SODIUM CHLORIDE 0.9 % IV SOLN
Freq: Once | INTRAVENOUS | Status: AC
  Administered 2014-09-30: 12:00:00 via INTRAVENOUS

## 2014-09-30 MED ORDER — PROPOFOL 10 MG/ML IV BOLUS
INTRAVENOUS | Status: DC | PRN
  Administered 2014-09-30 (×2): 50 mg via INTRAVENOUS
  Administered 2014-09-30: 20 mg via INTRAVENOUS

## 2014-09-30 MED ORDER — SODIUM CHLORIDE 0.9 % IV SOLN
INTRAVENOUS | Status: AC
  Administered 2014-09-30: 01:00:00 via INTRAVENOUS

## 2014-09-30 MED ORDER — CLONAZEPAM 0.5 MG PO TABS
0.2500 mg | ORAL_TABLET | Freq: Every day | ORAL | Status: DC
  Administered 2014-10-01 – 2014-10-02 (×3): 0.25 mg via ORAL
  Filled 2014-09-30 (×4): qty 1

## 2014-09-30 MED ORDER — HALOPERIDOL 5 MG PO TABS
5.0000 mg | ORAL_TABLET | Freq: Every day | ORAL | Status: DC
  Administered 2014-10-01 – 2014-10-02 (×3): 5 mg via ORAL
  Filled 2014-09-30 (×7): qty 1

## 2014-09-30 MED ORDER — TRIHEXYPHENIDYL HCL 5 MG PO TABS
2.5000 mg | ORAL_TABLET | Freq: Every day | ORAL | Status: DC
  Administered 2014-09-30 – 2014-10-03 (×4): 2.5 mg via ORAL
  Filled 2014-09-30 (×4): qty 1

## 2014-09-30 MED ORDER — ONDANSETRON HCL 4 MG/2ML IJ SOLN: 4.0000 mg | Freq: Four times a day (QID) | INTRAMUSCULAR | Status: DC | PRN

## 2014-09-30 MED ORDER — LIDOCAINE HCL (PF) 2 % IJ SOLN
INTRAMUSCULAR | Status: DC | PRN
  Administered 2014-09-30: 20 mg via INTRADERMAL

## 2014-09-30 MED ORDER — LACTATED RINGERS IV SOLN
INTRAVENOUS | Status: DC | PRN
  Administered 2014-09-30: 13:00:00 via INTRAVENOUS

## 2014-09-30 MED ORDER — LIDOCAINE HCL (CARDIAC) 20 MG/ML IV SOLN
INTRAVENOUS | Status: AC
  Filled 2014-09-30: qty 5

## 2014-09-30 MED ORDER — CLONAZEPAM 0.5 MG PO TABS: 0.2500 mg | ORAL_TABLET | Freq: Every day | ORAL | Status: DC | PRN

## 2014-09-30 SURGICAL SUPPLY — 14 items

## 2014-09-30 NOTE — Anesthesia Postprocedure Evaluation (Signed)
Anesthesia Post Note  Patient: David Lang  Procedure(s) Performed: Procedure(s) (LRB): ESOPHAGOGASTRODUODENOSCOPY (EGD) WITH PROPOFOL (N/A)  Anesthesia type: MAC  Patient location: PACU  Post pain: Pain level controlled  Post assessment: Post-op Vital signs reviewed  Last Vitals:  Filed Vitals:   09/30/14 1700  BP: 122/53  Pulse: 74  Temp:   Resp: 15    Post vital signs: Reviewed  Level of consciousness: sedated  Complications: No apparent anesthesia complications

## 2014-09-30 NOTE — Progress Notes (Signed)
[facility] PROGRESS NOTE  David Lang ZOX:[id number] DOB: [birth year] DOA: 09/29/2014 PCP: Default, Provider, MD  Assessment/Plan: David Lang is a 64 y.o. male has a past medical history of Hypertension; Diabetes mellitus without complication; Mental disorder; and Schizophrenia. Presented from [facility] memory unit with hematemesis of coffee-ground emesis. For some time patient was poorly responsive while in the ED but currently back to baseline.  Hematemesis, Upper GI bleed.  Continue with Protonix Gtt.  Transfuse 2 units PRBC.  [facility] , [facility]. Will consider adding Ceftriaxone in setting of GI bleed in case patient has cirrhosis. although patient with diarrhea, would hold on IV antibiotics.  If diarrhea persist will check for C diff.    Anemia; acute blood loss. Hb this am at 7. Will transfuse 2 units of PRBC. Anemia panel pending.   Acute encephalopathy ; Suspect to be multifactorial, acute illness, hypotension, elevated ammonia.  TSH, B 12 pending.   Lactic acidosis; in setting hypoperfusion. Repeat lactic acid. On IV fluids.   DM type 2 causing CKD stage 3; Continue with SSI.   CKD stage 3; Monitor renal function daily. Cr baseline around 2.  Suspect elevated BUN in setting of GI bleed.   Schizophrenia, paranoid type - continue home medications including Haldol and Zyprexa   Hyperammonemia; LFT low. Suspect liver diseases. Will check RUQ [place]. Hepatitis panel pending.   Code Status: Full code.  Family Communication:  Disposition Plan: Remain in the step down unit   Consultants:  GI  Procedures:  none  Antibiotics:  none  HPI/Subjective: He is alert, denies abdominal pain, no more hematemesis. Still with black  diarrhea.   Objective: Filed Vitals:   09/30/14 0700  BP: 130/71  Pulse: 88  Temp:   Resp: 23    Intake/Output Summary (Last 24 hours) at 09/30/14 0711 Last data filed at 09/30/14 0600  Gross per 24 hour  Intake 623.75 ml   Output    500 ml  Net 123.75 ml   Filed Weights   09/30/14 0030  Weight: 83.5 kg (184 lb 1.4 oz)    Exam:   General:  Alert, oriented to person and place.   Cardiovascular: S 1, S 2 RRR  Respiratory: CTA  Abdomen: bs present, soft, NT  Musculoskeletal: no edema  Data Reviewed: Basic Metabolic Panel:  Recent Labs Lab 09/29/14 1750 09/29/14 1940  NA 139 139  K 4.4 4.3  CL 105 105  CO2 27  --   GLUCOSE 173* 282*  BUN 76* 82*  CREATININE 2.33* 2.60*  CALCIUM 8.0*  --    Liver Function Tests:  Recent Labs Lab 09/29/14 1750  AST 9*  ALT 8*  ALKPHOS 43  BILITOT 0.7  PROT 5.7*  ALBUMIN 2.9*   No results for input(s): LIPASE, AMYLASE in the last 168 hours.  Recent Labs Lab 09/29/14 1903  AMMONIA 74*   CBC:  Recent Labs Lab 09/29/14 1750 09/29/14 1940 09/30/14 0110  WBC 12.2*  --  12.8*  NEUTROABS 7.5  --   --   HGB 9.3* 9.9* 7.6*  HCT 29.6* 29.0* 23.9*  MCV 90.0  --  91.2  PLT 352  --  329   Cardiac Enzymes:  Recent Labs Lab 09/29/14 2154  CKTOTAL 34*   BNP (last 3 results) No results for input(s): BNP in the last 8760 hours.  ProBNP (last 3 results) No results for input(s): PROBNP in the last 8760 hours.  CBG:  Recent Labs Lab 09/30/14 0352  GLUCAP 134*  Recent Results (from the past 240 hour(s))  MRSA PCR Screening     Status: None   Collection Time: 09/30/14  1:00 AM  Result Value Ref Range Status   MRSA by PCR NEGATIVE NEGATIVE Final    Comment:        The GeneXpert MRSA Assay (FDA approved for NASAL specimens only), is one component of a comprehensive MRSA colonization surveillance program. It is not intended to diagnose MRSA infection nor to guide or monitor treatment for MRSA infections.      Studies: Dg Chest 2 View  09/29/2014   CLINICAL DATA:  Hematemesis.  EXAM: CHEST  2 VIEW  COMPARISON:  08/19/2013  FINDINGS: Cardiac enlargement unchanged. Negative for heart failure. Left lower lobe atelectasis.   Elevated right hemidiaphragm with mild right lower lobe atelectasis or scarring, unchanged from the prior study.  Negative for pneumonia or mass lesion.  IMPRESSION: Cardiac enlargement with mild left lower lobe atelectasis.  Elevated right hemidiaphragm with mild right lower lobe chronic atelectasis or scarring.   Electronically Signed   By: Marlan Palau M.D.   On: 09/29/2014 21:52    Scheduled Meds: . clonazePAM  0.25 mg Oral QHS  . haloperidol  2.5 mg Oral Daily  . haloperidol  5 mg Oral QHS  . insulin aspart  0-9 Units Subcutaneous 6 times per day  . OLANZapine  5 mg Oral BID  . oxybutynin  1 patch Transdermal Q72H  . [START ON 10/03/2014] pantoprazole (PROTONIX) IV  40 mg Intravenous Q12H  . sodium chloride  3 mL Intravenous Q12H  . tamsulosin  0.4 mg Oral Daily  . trihexyphenidyl  2.5 mg Oral Daily   Continuous Infusions: . sodium chloride 100 mL/hr at 09/30/14 0050  . pantoprozole (PROTONIX) infusion 8 mg/hr (09/30/14 0143)    Active Problems:   Schizophrenia, paranoid type   DM type 2 causing CKD stage 3   Chronic anemia   Acute encephalopathy   Hematemesis   Hyperammonemia    Time spent: 35 minutes.     Hartley Barefoot A  [facility] Pager [phone]. If 7PM-7AM, please contact night-coverage at [email or web address], password [email or web address] 09/30/2014, 7:11 AM  LOS: 1 day

## 2014-09-30 NOTE — Anesthesia Preprocedure Evaluation (Addendum)
Anesthesia Evaluation  Patient identified by MRN, date of birth, ID band Patient awake    Reviewed: Allergy & Precautions, H&P , NPO status , Patient's Chart, lab work & pertinent test results, reviewed documented beta blocker date and time   Airway Mallampati: III  TM Distance: >3 FB Neck ROM: full  Mouth opening: Limited Mouth Opening  Dental no notable dental hx. (+) Teeth Intact   Pulmonary  Aspiration pneumonitis in the past    Pulmonary exam normal       Cardiovascular hypertension, Pt. on home beta blockers Normal cardiovascular exam    Neuro/Psych PSYCHIATRIC DISORDERS Schizophrenia    GI/Hepatic negative GI ROS, Neg liver ROS,   Endo/Other  diabetes, Type 2  Renal/GU Chronic with episodes in the  past of acute decompensation.     Musculoskeletal   Abdominal Normal abdominal exam  (+)   Peds  Hematology  (+) anemia ,   Anesthesia Other Findings   Reproductive/Obstetrics negative OB ROS                            Anesthesia Physical Anesthesia Plan  ASA: II  Anesthesia Plan: MAC   Post-op Pain Management:    Induction: Intravenous  Airway Management Planned: Natural Airway  Additional Equipment:   Intra-op Plan:   Post-operative Plan:   Informed Consent: I have reviewed the patients History and Physical, chart, labs and discussed the procedure including the risks, benefits and alternatives for the proposed anesthesia with the patient or authorized representative who has indicated his/her understanding and acceptance.     Plan Discussed with: CRNA and Surgeon  Anesthesia Plan Comments:        Anesthesia Quick Evaluation

## 2014-09-30 NOTE — Progress Notes (Signed)
CRITICAL VALUE ALERT  Critical value received:  6.8 hemoglobin  Date of notification:  09/30/14  Time of notification:  0724  Critical value read back:Yes.    Nurse who received alert:  Oren Beckmann, RN  MD notified (1st page):  B. Regalado, MD  Time of first page: 0730 Physician notified at bedside during rounds.   2 units of PRBCs ordered.

## 2014-09-30 NOTE — Consult Note (Addendum)
Reason for Consult: Hematemesis Referring Physician: Hospital team  David Lang is an 64 y.o. male.  HPI: Patient seen and examined and his hospital computer chart was reviewed and his case was discussed with the ER physician and the hospital team and although it's hard to know if we are getting an accurate history I think this is the patient's first GI bleed and he was on an aspirin a day at home and does have some upper tract symptoms which he takes Zantac for but has not had any other GI complaint and has never thrown up blood before and he has had some dark stools since and currently he has no other complaints  Past Medical History  Diagnosis Date  . Hypertension   . Diabetes mellitus without complication   . Mental disorder   . Schizophrenia     Past Surgical History  Procedure Laterality Date  . Hip arthroplasty Left 03/31/2012    Procedure: ARTHROPLASTY BIPOLAR HIP;  Surgeon: Nita Sells, MD;  Location: [facility];  Service: Orthopedics;  Laterality: Left;    Family History  Problem Relation Age of Onset  . Diabetes type II Mother   . Hypertension Father     Social History:  reports that he has never smoked. He has never used smokeless tobacco. He reports that he does not drink alcohol or use illicit drugs.  Allergies: No Known Allergies  Medications: I have reviewed the patient's current medications.  Results for orders placed or performed during the hospital encounter of 09/29/14 (from the past 48 hour(s))  CBC with Differential     Status: Abnormal   Collection Time: 09/29/14  5:50 PM  Result Value Ref Range   WBC 12.2 (H) 4.0 - 10.5 K/uL   RBC 3.29 (L) 4.22 - 5.81 MIL/uL   Hemoglobin 9.3 (L) 13.0 - 17.0 g/dL   HCT 29.6 (L) 39.0 - 52.0 %   MCV 90.0 78.0 - 100.0 fL   MCH 28.3 26.0 - 34.0 pg   MCHC 31.4 30.0 - 36.0 g/dL   RDW 12.1 11.5 - 15.5 %   Platelets 352 150 - 400 K/uL   Neutrophils Relative % 61 43 - 77 %   Neutro Abs 7.5 1.7 - 7.7 K/uL    Lymphocytes Relative 27 12 - 46 %   Lymphs Abs 3.2 0.7 - 4.0 K/uL   Monocytes Relative 12 3 - 12 %   Monocytes Absolute 1.4 (H) 0.1 - 1.0 K/uL   Eosinophils Relative 0 0 - 5 %   Eosinophils Absolute 0.0 0.0 - 0.7 K/uL   Basophils Relative 0 0 - 1 %   Basophils Absolute 0.0 0.0 - 0.1 K/uL  Basic metabolic panel     Status: Abnormal   Collection Time: 09/29/14  5:50 PM  Result Value Ref Range   Sodium 139 135 - 145 mmol/L   Potassium 4.4 3.5 - 5.1 mmol/L   Chloride 105 101 - 111 mmol/L   CO2 27 22 - 32 mmol/L   Glucose, Bld 173 (H) 65 - 99 mg/dL   BUN 76 (H) 6 - 20 mg/dL   Creatinine, Ser 2.33 (H) 0.61 - 1.24 mg/dL   Calcium 8.0 (L) 8.9 - 10.3 mg/dL   GFR calc non Af Amer 28 (L) >60 mL/min   GFR calc Af Amer 32 (L) >60 mL/min    Comment: (NOTE) The eGFR has been calculated using the CKD EPI equation. This calculation has not been validated in all clinical situations. eGFR's persistently <  60 mL/min signify possible Chronic Kidney Disease.    Anion gap 7 5 - 15  Hepatic function panel     Status: Abnormal   Collection Time: 09/29/14  5:50 PM  Result Value Ref Range   Total Protein 5.7 (L) 6.5 - 8.1 g/dL   Albumin 2.9 (L) 3.5 - 5.0 g/dL   AST 9 (L) 15 - 41 U/L   ALT 8 (L) 17 - 63 U/L   Alkaline Phosphatase 43 38 - 126 U/L   Total Bilirubin 0.7 0.3 - 1.2 mg/dL   Bilirubin, Direct <0.1 (L) 0.1 - 0.5 mg/dL   Indirect Bilirubin NOT CALCULATED 0.3 - 0.9 mg/dL  Protime-INR     Status: Abnormal   Collection Time: 09/29/14  5:50 PM  Result Value Ref Range   Prothrombin Time 16.0 (H) 11.6 - 15.2 seconds   INR 1.27 0.00 - 1.49  Ammonia     Status: Abnormal   Collection Time: 09/29/14  7:03 PM  Result Value Ref Range   Ammonia 74 (H) 9 - 35 umol/L  POC occult blood, ED     Status: Abnormal   Collection Time: 09/29/14  7:12 PM  Result Value Ref Range   Fecal Occult Bld POSITIVE (A) NEGATIVE  I-Stat CG4 Lactic Acid, ED     Status: Abnormal   Collection Time: 09/29/14  7:39 PM   Result Value Ref Range   Lactic Acid, Venous 4.70 (HH) 0.5 - 2.0 mmol/L   Comment NOTIFIED PHYSICIAN   I-stat chem 8, ed     Status: Abnormal   Collection Time: 09/29/14  7:40 PM  Result Value Ref Range   Sodium 139 135 - 145 mmol/L   Potassium 4.3 3.5 - 5.1 mmol/L   Chloride 105 101 - 111 mmol/L   BUN 82 (H) 6 - 20 mg/dL   Creatinine, Ser 2.60 (H) 0.61 - 1.24 mg/dL   Glucose, Bld 282 (H) 65 - 99 mg/dL   Calcium, Ion 0.98 (L) 1.13 - 1.30 mmol/L   TCO2 19 0 - 100 mmol/L   Hemoglobin 9.9 (L) 13.0 - 17.0 g/dL   HCT 29.0 (L) 39.0 - 52.0 %  Type and screen     Status: None (Preliminary result)   Collection Time: 09/29/14  8:15 PM  Result Value Ref Range   ABO/RH(D) A POS    Antibody Screen NEG    Sample Expiration 10/02/2014    Unit Number [id number]    Blood Component Type RED CELLS,LR    Unit division 00    Status of Unit ALLOCATED    Transfusion Status OK TO TRANSFUSE    Crossmatch Result Compatible    Unit Number [id number]    Blood Component Type RED CELLS,LR    Unit division 00    Status of Unit ALLOCATED    Transfusion Status OK TO TRANSFUSE    Crossmatch Result Compatible   ABO/Rh     Status: None   Collection Time: 09/29/14  8:15 PM  Result Value Ref Range   ABO/RH(D) A POS   Blood gas, arterial ([facility] ONLY)     Status: Abnormal   Collection Time: 09/29/14  9:25 PM  Result Value Ref Range   O2 Content 2.5 L/min   Delivery systems NASAL CANNULA    pH, Arterial 7.385 7.350 - 7.450   pCO2 arterial 35.9 35.0 - 45.0 mmHg   pO2, Arterial 126 (H) 80.0 - 100.0 mmHg   Bicarbonate 21.1 20.0 - 24.0 mEq/L   TCO2  20.2 0 - 100 mmol/L   Acid-base deficit 3.1 (H) 0.0 - 2.0 mmol/L   O2 Saturation 98.6 %   Patient temperature 97.9    Collection site RIGHT RADIAL    Drawn by [id number]    Sample type ARTERIAL    Allens test (pass/fail) PASS PASS  CK     Status: Abnormal   Collection Time: 09/29/14  9:54 PM  Result Value Ref Range   Total CK 34 (L) 49 - 397 U/L   I-Stat CG4 Lactic Acid, ED     Status: None   Collection Time: 09/29/14 10:04 PM  Result Value Ref Range   Lactic Acid, Venous 1.28 0.5 - 2.0 mmol/L  MRSA PCR Screening     Status: None   Collection Time: 09/30/14  1:00 AM  Result Value Ref Range   MRSA by PCR NEGATIVE NEGATIVE    Comment:        The GeneXpert MRSA Assay (FDA approved for NASAL specimens only), is one component of a comprehensive MRSA colonization surveillance program. It is not intended to diagnose MRSA infection nor to guide or monitor treatment for MRSA infections.   Folate     Status: None   Collection Time: 09/30/14  1:01 AM  Result Value Ref Range   Folate 8.0 >5.9 ng/mL    Comment: Performed at [facility]  Vitamin B12     Status: None   Collection Time: 09/30/14  1:10 AM  Result Value Ref Range   Vitamin B-12 439 180 - 914 pg/mL    Comment: (NOTE) This assay is not validated for testing neonatal or myeloproliferative syndrome specimens for Vitamin B12 levels. Performed at [facility]   Iron and TIBC     Status: Abnormal   Collection Time: 09/30/14  1:10 AM  Result Value Ref Range   Iron 76 45 - 182 ug/dL   TIBC 172 (L) 250 - 450 ug/dL   Saturation Ratios 44 (H) 17.9 - 39.5 %   UIBC 96 ug/dL    Comment: Performed at [facility]  Ferritin     Status: None   Collection Time: 09/30/14  1:10 AM  Result Value Ref Range   Ferritin 45 24 - 336 ng/mL    Comment: Performed at [facility]  Reticulocytes     Status: Abnormal   Collection Time: 09/30/14  1:10 AM  Result Value Ref Range   Retic Ct Pct 1.3 0.4 - 3.1 %   RBC. 2.57 (L) 4.22 - 5.81 MIL/uL   Retic Count, Manual 33.4 19.0 - 186.0 K/uL  CBC     Status: Abnormal   Collection Time: 09/30/14  1:10 AM  Result Value Ref Range   WBC 12.8 (H) 4.0 - 10.5 K/uL   RBC 2.62 (L) 4.22 - 5.81 MIL/uL   Hemoglobin 7.6 (L) 13.0 - 17.0 g/dL    Comment: DELTA CHECK NOTED REPEATED TO VERIFY PREVIOUS RESULT ISTAT    HCT  23.9 (L) 39.0 - 52.0 %   MCV 91.2 78.0 - 100.0 fL   MCH 29.0 26.0 - 34.0 pg   MCHC 31.8 30.0 - 36.0 g/dL   RDW 12.2 11.5 - 15.5 %   Platelets 329 150 - 400 K/uL  Glucose, capillary     Status: Abnormal   Collection Time: 09/30/14  3:52 AM  Result Value Ref Range   Glucose-Capillary 134 (H) 65 - 99 mg/dL  Magnesium     Status: Abnormal   Collection Time: 09/30/14  6:50 AM  Result Value Ref Range   Magnesium 1.6 (L) 1.7 - 2.4 mg/dL  Phosphorus     Status: None   Collection Time: 09/30/14  6:50 AM  Result Value Ref Range   Phosphorus 2.6 2.5 - 4.6 mg/dL  TSH     Status: None   Collection Time: 09/30/14  6:50 AM  Result Value Ref Range   TSH 0.361 0.350 - 4.500 uIU/mL  Comprehensive metabolic panel     Status: Abnormal   Collection Time: 09/30/14  6:50 AM  Result Value Ref Range   Sodium 143 135 - 145 mmol/L   Potassium 4.8 3.5 - 5.1 mmol/L   Chloride 115 (H) 101 - 111 mmol/L   CO2 21 (L) 22 - 32 mmol/L   Glucose, Bld 136 (H) 65 - 99 mg/dL   BUN 106 (H) 6 - 20 mg/dL    Comment: RESULTS CONFIRMED BY MANUAL DILUTION   Creatinine, Ser 2.37 (H) 0.61 - 1.24 mg/dL   Calcium 7.6 (L) 8.9 - 10.3 mg/dL   Total Protein 5.0 (L) 6.5 - 8.1 g/dL   Albumin 2.7 (L) 3.5 - 5.0 g/dL   AST 7 (L) 15 - 41 U/L   ALT 6 (L) 17 - 63 U/L   Alkaline Phosphatase 32 (L) 38 - 126 U/L   Total Bilirubin 0.2 (L) 0.3 - 1.2 mg/dL   GFR calc non Af Amer 27 (L) >60 mL/min   GFR calc Af Amer 32 (L) >60 mL/min    Comment: (NOTE) The eGFR has been calculated using the CKD EPI equation. This calculation has not been validated in all clinical situations. eGFR's persistently <60 mL/min signify possible Chronic Kidney Disease.    Anion gap 7 5 - 15  CBC     Status: Abnormal   Collection Time: 09/30/14  6:50 AM  Result Value Ref Range   WBC 9.5 4.0 - 10.5 K/uL   RBC 2.31 (L) 4.22 - 5.81 MIL/uL   Hemoglobin 6.8 (LL) 13.0 - 17.0 g/dL    Comment: RESULT REPEATED AND VERIFIED CRITICAL RESULT CALLED TO, READ BACK BY  AND VERIFIED WITH: J.GARLAND AT 2122 ON 09/30/14 BY S.VANHOORNE    HCT 21.0 (L) 39.0 - 52.0 %   MCV 90.9 78.0 - 100.0 fL   MCH 29.4 26.0 - 34.0 pg   MCHC 32.4 30.0 - 36.0 g/dL   RDW 12.2 11.5 - 15.5 %   Platelets 269 150 - 400 K/uL  Protime-INR     Status: Abnormal   Collection Time: 09/30/14  6:50 AM  Result Value Ref Range   Prothrombin Time 16.7 (H) 11.6 - 15.2 seconds   INR 1.34 0.00 - 1.49  Prepare RBC     Status: None   Collection Time: 09/30/14  8:00 AM  Result Value Ref Range   Order Confirmation ORDER PROCESSED BY BLOOD BANK   Lactic acid, plasma     Status: None   Collection Time: 09/30/14  8:30 AM  Result Value Ref Range   Lactic Acid, Venous 0.7 0.5 - 2.0 mmol/L    Dg Chest 2 View  09/29/2014   CLINICAL DATA:  Hematemesis.  EXAM: CHEST  2 VIEW  COMPARISON:  08/19/2013  FINDINGS: Cardiac enlargement unchanged. Negative for heart failure. Left lower lobe atelectasis.  Elevated right hemidiaphragm with mild right lower lobe atelectasis or scarring, unchanged from the prior study.  Negative for pneumonia or mass lesion.  IMPRESSION: Cardiac enlargement with mild left lower lobe atelectasis.  Elevated right  hemidiaphragm with mild right lower lobe chronic atelectasis or scarring.   Electronically Signed   By: Franchot Gallo M.D.   On: 09/29/2014 21:52    ROS negative except above Blood pressure 130/71, pulse 88, temperature 98.5 F (36.9 C), temperature source Oral, resp. rate 23, height 6' 2"  (1.88 m), weight 83.5 kg (184 lb 1.4 oz), SpO2 99 %. Physical Exam Vital signs stable afebrile no acute distress lungs are clear regular rate and rhythm abdomen is soft nontender good bowel sounds patient lying comfortably in the bed labs reviewed he does have chronic renal insufficiency Assessment/Plan: Upper GI bleed in a patient on aspirin with some upper tract symptoms Plan: I discussed endoscopy with him including the risks and methods and he is in agreement with proceeding today  with anesthesia assistance for sedation  Marylene Masek E 09/30/2014, 10:42 AM

## 2014-09-30 NOTE — Op Note (Signed)
[facility] [address] [place], [address]   1ENDOSCOPY PROCEDURE REPORT  PATIENT: David Lang, David Lang  MR#: [id number] BIRTHDATE: 05/11/1950 , 63  yrs. old GENDER: male ENDOSCOPIST: Vida Rigger, MD REFERRED BY: PROCEDURE DATE:  10-20-2014 PROCEDURE:  EGD, diagnostic ASA CLASS:     Class II INDICATIONS:  hematemesis, melena, and acute post hemorrhagic anemia. MEDICATIONS: Propofol 120 mg IV and Lidocaine 20 mg IV TOPICAL ANESTHETIC: none  DESCRIPTION OF PROCEDURE: After the risks benefits and alternatives of the procedure were thoroughly explained, informed consent was obtained.  The Pentax Gastroscope [id number] endoscope was introduced through the mouth and advanced to theduodenal bulb but due to looping and a J-shaped stomach we were unable to advance any further so at the end of the procedure we used the ultraslim scope series [id number] and we were able to advance to the second portion of the duodenum , Without limitations. however we could not advance any further due to increase looping The instrument was slowly withdrawn as the mucosa was fully examined. Estimated blood loss is zero unless otherwise noted in this procedure report.    the findings are recorded below       Retroflexed views revealed no abnormalities.     The scope was then withdrawn from the patient and the procedure completed.  COMPLICATIONS: There were no immediate complications.  ENDOSCOPIC IMPRESSION: 1. 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 Old blood and clots in stomach 3. Otherwise within normal limits to the second portion of the duodenum  RECOMMENDATIONS: clear liquids only pump inhibitors long-term reevaluate aspirin and blood thinner needs and care with non-steroidal's in the future and consider repeat endoscopy in 2-3 months to document healing   REPEAT EXAM: as needed or as  above  eSigned:  Vida Rigger, MD October 20, 2014 1:05 PM    CC:  CPT CODES: ICD CODES:  The ICD and CPT codes recommended by this software are interpretations from the data that the clinical staff has captured with the software.  The verification of the translation of this report to the ICD and CPT codes and modifiers is the sole responsibility of the health care institution and practicing physician where this report was generated.  [employer] will not be held responsible for the validity of the ICD and CPT codes included on this report.  AMA assumes no liability for data contained or not contained herein. CPT is a Publishing rights manager of the [employer].  PATIENT NAME:  Quenton, Recendez MR#: [id number]

## 2014-09-30 NOTE — Transfer of Care (Signed)
Immediate Anesthesia Transfer of Care Note  Patient: David Lang  Procedure(s) Performed: Procedure(s): ESOPHAGOGASTRODUODENOSCOPY (EGD) WITH PROPOFOL (N/A)  Patient Location: PACU  Anesthesia Type:MAC  Level of Consciousness:  sedated, patient cooperative and responds to stimulation  Airway & Oxygen Therapy:Patient Spontanous Breathing and Patient connected to nasal cannula  Post-op Assessment:  Report given to PACU RN and Post -op Vital signs reviewed and stable  Post vital signs:  Reviewed and stable  Last Vitals:  Filed Vitals:   09/30/14 1229  BP: 126/69  Pulse:   Temp: 37.2 C  Resp: 17    Complications: No apparent anesthesia complications

## 2014-10-01 ENCOUNTER — Inpatient Hospital Stay (HOSPITAL_COMMUNITY): Payer: BLUE CROSS/BLUE SHIELD

## 2014-10-01 DIAGNOSIS — I517 Cardiomegaly: Secondary | ICD-10-CM

## 2014-10-01 LAB — GLUCOSE, CAPILLARY
GLUCOSE-CAPILLARY: 133 mg/dL — AB (ref 65–99)
GLUCOSE-CAPILLARY: 84 mg/dL (ref 65–99)
GLUCOSE-CAPILLARY: 113 mg/dL — AB (ref 65–99)
Glucose-Capillary: 114 mg/dL — ABNORMAL HIGH (ref 65–99)
Glucose-Capillary: 145 mg/dL — ABNORMAL HIGH (ref 65–99)
Glucose-Capillary: 83 mg/dL (ref 65–99)

## 2014-10-01 LAB — BASIC METABOLIC PANEL
ANION GAP: 5 (ref 5–15)
BUN: 97 mg/dL — AB (ref 6–20)
CALCIUM: 8.2 mg/dL — AB (ref 8.9–10.3)
CO2: 22 mmol/L (ref 22–32)
CREATININE: 2.22 mg/dL — AB (ref 0.61–1.24)
Chloride: 117 mmol/L — ABNORMAL HIGH (ref 101–111)
GFR calc Af Amer: 34 mL/min — ABNORMAL LOW (ref >60)
GFR, EST NON AFRICAN AMERICAN: 30 mL/min — AB (ref >60)
GLUCOSE: 90 mg/dL (ref 65–99)
Potassium: 4.3 mmol/L (ref 3.5–5.1)
Sodium: 144 mmol/L (ref 135–145)

## 2014-10-01 LAB — CBC
HCT: 28.2 % — ABNORMAL LOW (ref 39.0–52.0)
HCT: 28.9 % — ABNORMAL LOW (ref 39.0–52.0)
HEMOGLOBIN: 9.6 g/dL — AB (ref 13.0–17.0)
Hemoglobin: 9.1 g/dL — ABNORMAL LOW (ref 13.0–17.0)
MCH: 29.3 pg (ref 26.0–34.0)
MCH: 30.6 pg (ref 26.0–34.0)
MCHC: 32.3 g/dL (ref 30.0–36.0)
MCHC: 33.2 g/dL (ref 30.0–36.0)
MCV: 90.7 fL (ref 78.0–100.0)
MCV: 92 fL (ref 78.0–100.0)
PLATELETS: 257 10*3/uL (ref 150–400)
PLATELETS: 252 10*3/uL (ref 150–400)
RBC: 3.11 MIL/uL — ABNORMAL LOW (ref 4.22–5.81)
RBC: 3.14 MIL/uL — ABNORMAL LOW (ref 4.22–5.81)
RDW: 13.7 % (ref 11.5–15.5)
RDW: 13.9 % (ref 11.5–15.5)
WBC: 11.5 10*3/uL — ABNORMAL HIGH (ref 4.0–10.5)
WBC: 11.1 10*3/uL — ABNORMAL HIGH (ref 4.0–10.5)

## 2014-10-01 LAB — TYPE AND SCREEN
ABO/RH(D): A POS
Antibody Screen: NEGATIVE
UNIT DIVISION: 0
Unit division: 0

## 2014-10-01 LAB — TROPONIN I: Troponin I: <0.03 ng/mL (ref <0.031)

## 2014-10-01 MED ORDER — LACTULOSE 10 GM/15ML PO SOLN
20.0000 g | Freq: Every day | ORAL | Status: DC
  Administered 2014-10-01 – 2014-10-03 (×3): 20 g via ORAL
  Filled 2014-10-01 (×3): qty 30

## 2014-10-01 NOTE — Clinical Social Work Note (Signed)
Clinical Social Work Assessment  Patient Details  Name: David Lang MRN: [id number] Date of Birth: 02/17/1950  Date of referral:  10/01/14               Reason for consult:  Discharge Planning                Permission sought to share information with:  Family Supports Permission granted to share information::  Yes, Verbal Permission Granted  Name::     David Lang  Agency::     Relationship::  sister  Contact Information:     Housing/Transportation Living arrangements for the past 2 months:  [place] of Information:  Patient Patient Interpreter Needed:  None Criminal Activity/Legal Involvement Pertinent to Current Situation/Hospitalization:  No - Comment as needed Significant Relationships:  Siblings Lives with:  Facility Resident Do you feel safe going back to the place where you live?  Yes Need for family participation in patient care:  Yes (Comment)  Care giving concerns:  Patient reports he will return to ALF.   Social Worker assessment / plan:  CSW received referral due to patient being admitted from [facility]. CSW reviewed chart and met with patient at bedside. Patient reports he is doing well but has been trying to contact sister David Lang) who lives out of town. Patient asked that CSW call sister and ask her to call him on his room phone. CSW called sister and left a message with CSW contact information. Patient confirms he is from ALF and plans to return at [place] spoke with ALF who reports patient was using a walker to ambulate at ALF. Patient required assistance with bathing and dressing. Patient is currently living in memory care but was not experiencing any behaviors prior to admission. ALF is agreeable to accept patient back when medically stable.  FL2 completed and placed on chart for MD signature.  Employment status:  Disabled (Comment on whether or not currently receiving Disability) Insurance information:  Managed Care PT Recommendations:   Not assessed at this time Information / Referral to community resources:  Other (Comment Required) (Will return to ALF)  Patient/Family's Response to care:  Patient engaged during assessment but wanting to speak with family.  Patient/Family's Understanding of and Emotional Response to Diagnosis, Current Treatment, and Prognosis:  Patient reports he is feeling better and wants to go back home when stable. Patient reports he enjoys ALF and that sister's ensure he is provided for. Patient reports he is unsure why he had to come to the hospital but reports he will be compliant with treatment.  Emotional Assessment Appearance:  Appears stated age Attitude/Demeanor/Rapport:  Other (Cooperative) Affect (typically observed):  Flat Orientation:  Oriented to  Time, Oriented to Place, Oriented to Self Alcohol / Substance use:  Not Applicable Psych involvement (Current and /or in the community):  No (Comment)  Discharge Needs  Concerns to be addressed:  No discharge needs identified Readmission within the last 30 days:  No Current discharge risk:  None Barriers to Discharge:  No Barriers Identified   David Lang, [place] 10/01/2014, 11:00 AM (Coverage for David Lang)

## 2014-10-01 NOTE — Progress Notes (Signed)
[facility] PROGRESS NOTE  David Lang XIP:[id number] DOB: Apr 08, 1950 DOA: 09/29/2014 PCP: Default, Provider, MD  Assessment/Plan: Keric Zehren is a 64 y.o. male has a past medical history of Hypertension; Diabetes mellitus without complication; Mental disorder; and Schizophrenia. Presented from [facility] memory unit with hematemesis of coffee-ground emesis. For some time patient was poorly responsive while in the ED but subsequently back to baseline.  Hematemesis, Upper GI bleed. : Mallory-Weiss Tear and severe distal and mid ulcerative esophagitis.  Continue with Protonix Gtt.  S/P  2 units PRBC transfusion 9-4 Dr Watt Climes following.  S/P endoscopy 9-04 which showed Mallory-Weiss Tear and severe distal and mid ulcerative esophagitis. On clear diet.   Anemia; acute blood loss. In setting of GI bleed.  S/P 2 units of PRBC on 9-04.  Hb stable. continue to follow trend.  Hb at 9.   Acute encephalopathy ; Suspect to be multifactorial, acute illness, hypotension, elevated ammonia.  TSH 0.361, B 12: 439 .  Start lactulose.   Lactic acidosis; in setting hypoperfusion. Repeat lactic acid normal . Resolved.   DM type 2 causing CKD stage 3; Continue with SSI.   CKD stage 3; Monitor renal function daily. Cr baseline around 2.  Suspect elevated BUN in setting of GI bleed.  Awaiting B-met.   Schizophrenia, paranoid type - continue home medications including Haldol and Zyprexa   Hyperammonemia; LFT low. RUQ US showed liver steatosis. . Hepatitis panel pending.  Ammonia level trending down. Might be related to GI bleed, increase BUN. Start low dose of lactulose/   Code Status: Full code.  Family Communication:  Disposition Plan: Remain in the step down unit, might be able to be transfer later today if hb remain stable and no further GI bleed.    Consultants:  GI, Dr Watt Climes.   Procedures: Upper Endoscopy 9-04: Small hiatal hernia with adherent clot at GE junction probably from  Mallory-Weiss tear and severe distal and mid ulcerative esophagitis 2. Old blood and clots in stomach 3. Otherwise within  normal limits to the second portion of the duodenum  Antibiotics:  none  HPI/Subjective: He needs to be change , he is sock in urine.  He denies abdominal pain  Objective: Filed Vitals:   10/01/14 0600  BP: 154/77  Pulse: 73  Temp:   Resp: 17    Intake/Output Summary (Last 24 hours) at 10/01/14 0719 Last data filed at 10/01/14 0600  Gross per 24 hour  Intake 2558.33 ml  Output   2150 ml  Net 408.33 ml   Filed Weights   09/30/14 0030 10/01/14 0325  Weight: 83.5 kg (184 lb 1.4 oz) 83.5 kg (184 lb 1.4 oz)    Exam:   General:  Alert, oriented to person and place.   Cardiovascular: S 1, S 2 RRR  Respiratory: CTA  Abdomen: bs present, soft, NT  Musculoskeletal: no edema  Data Reviewed: Basic Metabolic Panel:  Recent Labs Lab 09/29/14 1750 09/29/14 1940 09/30/14 0650  NA 139 139 143  K 4.4 4.3 4.8  CL 105 105 115*  CO2 27  --  21*  GLUCOSE 173* 282* 136*  BUN 76* 82* 106*  CREATININE 2.33* 2.60* 2.37*  CALCIUM 8.0*  --  7.6*  MG  --   --  1.6*  PHOS  --   --  2.6   Liver Function Tests:  Recent Labs Lab 09/29/14 1750 09/30/14 0650  AST 9* 7*  ALT 8* 6*  ALKPHOS 43 32*  BILITOT 0.7 0.2*  PROT 5.7* 5.0*  ALBUMIN 2.9* 2.7*   No results for input(s): LIPASE, AMYLASE in the last 168 hours.  Recent Labs Lab 09/29/14 1903 09/30/14 1850  AMMONIA 74* 53*   CBC:  Recent Labs Lab 09/29/14 1750 09/29/14 1940 09/30/14 0110 09/30/14 0650 09/30/14 1850 10/01/14 0326  WBC 12.2*  --  12.8* 9.5 11.4* 11.5*  NEUTROABS 7.5  --   --   --   --   --   HGB 9.3* 9.9* 7.6* 6.8* 10.5* 9.1*  HCT 29.6* 29.0* 23.9* 21.0* 31.8* 28.2*  MCV 90.0  --  91.2 90.9 91.1 90.7  PLT 352  --  329 269 273 257   Cardiac Enzymes:  Recent Labs Lab 09/29/14 2154 09/30/14 1850 10/01/14 0326  CKTOTAL 34*  --   --   TROPONINI  --  <0.03  <0.03   BNP (last 3 results) No results for input(s): BNP in the last 8760 hours.  ProBNP (last 3 results) No results for input(s): PROBNP in the last 8760 hours.  CBG:  Recent Labs Lab 09/30/14 1144 09/30/14 1606 09/30/14 2007 09/30/14 2326 10/01/14 0327  GLUCAP 121* 125* 216* 133* 84    Recent Results (from the past 240 hour(s))  MRSA PCR Screening     Status: None   Collection Time: 09/30/14  1:00 AM  Result Value Ref Range Status   MRSA by PCR NEGATIVE NEGATIVE Final    Comment:        The GeneXpert MRSA Assay (FDA approved for NASAL specimens only), is one component of a comprehensive MRSA colonization surveillance program. It is not intended to diagnose MRSA infection nor to guide or monitor treatment for MRSA infections.      Studies: Dg Chest 2 View  09/29/2014   CLINICAL DATA:  Hematemesis.  EXAM: CHEST  2 VIEW  COMPARISON:  08/19/2013  FINDINGS: Cardiac enlargement unchanged. Negative for heart failure. Left lower lobe atelectasis.  Elevated right hemidiaphragm with mild right lower lobe atelectasis or scarring, unchanged from the prior study.  Negative for pneumonia or mass lesion.  IMPRESSION: Cardiac enlargement with mild left lower lobe atelectasis.  Elevated right hemidiaphragm with mild right lower lobe chronic atelectasis or scarring.   Electronically Signed   By: Franchot Gallo M.D.   On: 09/29/2014 21:52   US Abdomen Limited Ruq  09/30/2014   CLINICAL DATA:  Elevated ammonia. History of hypertension and diabetes.  EXAM: US ABDOMEN LIMITED - RIGHT UPPER QUADRANT  COMPARISON:  None.  FINDINGS: Gallbladder:  No gallstones or wall thickening visualized. No sonographic Murphy sign noted.  Common bile duct:  Diameter: 3.7 mm  Liver:  Mildly echogenic.  Two right renal cysts are noted. The largest is arising from the upper pole, measuring 6.5 cm in maximum diameter. There is also a small right pleural effusion.  IMPRESSION: 1. Mildly echogenic liver, most likely  due to steatosis. 2. Small right pleural effusion. 3. Right renal cysts.   Electronically Signed   By: Claudie Revering M.D.   On: 09/30/2014 12:37    Scheduled Meds: . antiseptic oral rinse  7 mL Mouth Rinse q12n4p  . chlorhexidine  15 mL Mouth Rinse BID  . clonazePAM  0.25 mg Oral QHS  . haloperidol  2.5 mg Oral Daily  . haloperidol  5 mg Oral QHS  . insulin aspart  0-9 Units Subcutaneous 6 times per day  . OLANZapine  5 mg Oral BID  . oxybutynin  1 patch Transdermal Q72H  . [START  ON 10/03/2014] pantoprazole (PROTONIX) IV  40 mg Intravenous Q12H  . sodium chloride  3 mL Intravenous Q12H  . tamsulosin  0.4 mg Oral Daily  . trihexyphenidyl  2.5 mg Oral Daily   Continuous Infusions: . sodium chloride 75 mL/hr at 09/30/14 1900  . pantoprozole (PROTONIX) infusion 8 mg/hr (09/30/14 2357)    Active Problems:   Schizophrenia, paranoid type   DM type 2 causing CKD stage 3   Chronic anemia   Acute encephalopathy   Hematemesis   Hyperammonemia    Time spent: 35 minutes.     Niel Hummer A  [facility] Pager [phone]. If 7PM-7AM, please contact night-coverage at [email or web address], password [facility] 10/01/2014, 7:19 AM  LOS: 2 days

## 2014-10-01 NOTE — Progress Notes (Signed)
  Echocardiogram 2D Echocardiogram has been performed.  Nolon Rod 10/01/2014, 12:45 PM

## 2014-10-01 NOTE — Progress Notes (Signed)
David Lang 9:37 AM  Subjective: Patient without signs of bleeding and did well with his procedure and no obvious new complaints and we discussed the results with him and he requests some reading material which the nurse will  print off for him  Objective: Vital signs stable afebrile no acute distress abdomen is soft nontender good bowel sounds hemoglobin slight decrease as did BUN and creatinine  Assessment: Mallory-Weiss tear and esophageal ulcers from reflux and hiatal hernia  Plan: If no signs of further bleeding after clear liquid lunch may have soft solids for evening meal and tomorrow can change him to by mouth pump inhibitors and please arrange follow-up with me in the office in one month to hopefully set up outpatient repeat endoscopy in 2-3 months to document healing and care with aspirin and non-steroidal's as an outpatient  [facility] E  Pager [phone] After 5PM or if no answer call [phone]

## 2014-10-02 LAB — GLUCOSE, CAPILLARY
GLUCOSE-CAPILLARY: 109 mg/dL — AB (ref 65–99)
GLUCOSE-CAPILLARY: 144 mg/dL — AB (ref 65–99)
GLUCOSE-CAPILLARY: 211 mg/dL — AB (ref 65–99)
GLUCOSE-CAPILLARY: 96 mg/dL (ref 65–99)
Glucose-Capillary: 113 mg/dL — ABNORMAL HIGH (ref 65–99)
Glucose-Capillary: 113 mg/dL — ABNORMAL HIGH (ref 65–99)

## 2014-10-02 LAB — HEMOGLOBIN A1C
Hgb A1c MFr Bld: 7.5 % — ABNORMAL HIGH (ref 4.8–5.6)
Mean Plasma Glucose: 169 mg/dL

## 2014-10-02 LAB — CBC
HCT: 27.8 % — ABNORMAL LOW (ref 39.0–52.0)
HEMOGLOBIN: 9.1 g/dL — AB (ref 13.0–17.0)
MCH: 29.5 pg (ref 26.0–34.0)
MCHC: 32.7 g/dL (ref 30.0–36.0)
MCV: 90.3 fL (ref 78.0–100.0)
PLATELETS: 287 10*3/uL (ref 150–400)
RBC: 3.08 MIL/uL — AB (ref 4.22–5.81)
RDW: 13.4 % (ref 11.5–15.5)
WBC: 10.2 10*3/uL (ref 4.0–10.5)

## 2014-10-02 LAB — BASIC METABOLIC PANEL
ANION GAP: 6 (ref 5–15)
BUN: 53 mg/dL — ABNORMAL HIGH (ref 6–20)
CHLORIDE: 117 mmol/L — AB (ref 101–111)
CO2: 21 mmol/L — ABNORMAL LOW (ref 22–32)
Calcium: 8.4 mg/dL — ABNORMAL LOW (ref 8.9–10.3)
Creatinine, Ser: 1.73 mg/dL — ABNORMAL HIGH (ref 0.61–1.24)
GFR calc Af Amer: 46 mL/min — ABNORMAL LOW (ref >60)
GFR, EST NON AFRICAN AMERICAN: 40 mL/min — AB (ref >60)
Glucose, Bld: 118 mg/dL — ABNORMAL HIGH (ref 65–99)
POTASSIUM: 4.1 mmol/L (ref 3.5–5.1)
SODIUM: 144 mmol/L (ref 135–145)

## 2014-10-02 LAB — AMMONIA: AMMONIA: 15 umol/L (ref 9–35)

## 2014-10-02 LAB — HEPATITIS PANEL, ACUTE
HEP B C IGM: NEGATIVE
HEP B S AG: NEGATIVE
Hep A IgM: NEGATIVE

## 2014-10-02 MED ORDER — INSULIN ASPART 100 UNIT/ML ~~LOC~~ SOLN: 0.0000 [IU] | Freq: Three times a day (TID) | SUBCUTANEOUS | Status: DC

## 2014-10-02 NOTE — Progress Notes (Signed)
Clinical Social Work  Per chart review, patient to DC back to ALF tomorrow. CSW spoke with [facility] who is aware of possible DC and reports on face-to-face evaluation needed prior to DC. CSW to fax FL2 and DC summary to [phone] when medically stable.  [place], [place] [phone]

## 2014-10-02 NOTE — Progress Notes (Signed)
CSW contacted [facility] and spoke with Irwin in nsg. Pt will need to be 24 hrs sitter free prior to returning to ALF. NSG / MD have been updated. Clinical updates have been sent to ALF for review. CSW will continue to follow to assist with d/c planning back to ALF when stable.  Cori Razor LCSW [phone]

## 2014-10-02 NOTE — Care Management Note (Signed)
Case Management Note  Patient Details  Name: Stylianos Stradling MRN: [id number] Date of Birth: [birth year]  Subjective/Objective:           Admitted with symptomatic GIB         Action/Plan: Discharge planning per CSW. Return to ALF when stable   Expected Discharge Date:                  Expected Discharge Plan:  Assisted Living / Rest Home  In-House Referral:  Clinical Social Work  Discharge planning Services  CM Consult  Post Acute Care Choice:  NA Choice offered to:  NA  DME Arranged:  N/A DME Agency:  NA  HH Arranged:  NA HH Agency:  NA  Status of Service:  Completed, signed off  Medicare Important Message Given:    Date Medicare IM Given:    Medicare IM give by:    Date Additional Medicare IM Given:    Additional Medicare Important Message give by:     If discussed at Long Length of Stay Meetings, dates discussed:    Additional Comments:  Alexis Goodell, RN 10/02/2014, 11:45 AM

## 2014-10-02 NOTE — Progress Notes (Signed)
[facility] PROGRESS NOTE  David Lang OZH:[id number] DOB: 1950/11/26 DOA: 09/29/2014 PCP: Default, Provider, MD  Assessment/Plan: David Lang is a 64 y.o. male has a past medical history of Hypertension; Diabetes mellitus without complication; Mental disorder; and Schizophrenia. Presented from [facility] memory unit with hematemesis of coffee-ground emesis. For some time patient was poorly responsive while in the ED but subsequently back to baseline. Patient admitted with GI bleed, hematemesis, S/P endoscopy which showed Mallory-Weiss Tear and severe distal and mid ulcerative esophagitis.    Hematemesis, Upper GI bleed. : Mallory-Weiss Tear and severe distal and mid ulcerative esophagitis.  Continue with Protonix Gtt.  S/P  2 units PRBC transfusion 9-4 Dr Ewing Schlein sing off.  S/P endoscopy 9-04 which showed Mallory-Weiss Tear and severe distal and mid ulcerative esophagitis. Diet advance. Needs to be discharge on PPI BID. Needs to follow up with Dr Ewing Schlein in 1 month. Need endoscopy in 2 to 3 months.   Anemia; acute blood loss. In setting of GI bleed.  S/P 2 units of PRBC on 9-04.  Hb stable. continue to follow trend.  Hb at 9. Stable.   Acute encephalopathy ; Suspect to be multifactorial, acute illness, hypotension, elevated ammonia.  TSH 0.361, B 12: 439 .  Started  lactulose.  Stable.   Lactic acidosis; in setting hypoperfusion. Repeat lactic acid normal . Resolved.   DM type 2 causing CKD stage 3; Continue with SSI.   CKD stage 3; Monitor renal function daily. Cr baseline around 2.  Suspect elevated BUN in setting of GI bleed.  Improving.   Schizophrenia, paranoid type - continue home medications including Haldol and Zyprexa   Hyperammonemia; LFT low. RUQ US showed liver steatosis. . Hepatitis panel negative. Ammonia level trending down. Might be related to GI bleed, increase BUN. Started on  low dose of lactulose/   Code Status: Full code.  Family Communication:   Disposition Plan: discontinue sitter, ALF 9-7 if stable.   Consultants:  GI, Dr Ewing Schlein.   Procedures: Upper Endoscopy 9-04: Small hiatal hernia with adherent clot at GE junction probably from Mallory-Weiss tear and severe distal and mid ulcerative esophagitis 2. Old blood and clots in stomach 3. Otherwise within  normal limits to the second portion of the duodenum  Antibiotics:  none  HPI/Subjective: He needs to be change , he is sock in urine.  He denies abdominal pain  Objective: Filed Vitals:   10/02/14 0519  BP: 138/73  Pulse: 78  Temp: 98.7 F (37.1 C)  Resp: 14    Intake/Output Summary (Last 24 hours) at 10/02/14 1351 Last data filed at 10/02/14 1300  Gross per 24 hour  Intake   1845 ml  Output    875 ml  Net    970 ml   Filed Weights   09/30/14 0030 10/01/14 0325  Weight: 83.5 kg (184 lb 1.4 oz) 83.5 kg (184 lb 1.4 oz)    Exam:   General:  Alert, oriented to person and place.   Cardiovascular: S 1, S 2 RRR  Respiratory: CTA  Abdomen: bs present, soft, NT  Musculoskeletal: no edema  Data Reviewed: Basic Metabolic Panel:  Recent Labs Lab 09/29/14 1750 09/29/14 1940 09/30/14 0650 10/01/14 0326 10/02/14 0500  NA 139 139 143 144 144  K 4.4 4.3 4.8 4.3 4.1  CL 105 105 115* 117* 117*  CO2 27  --  21* 22 21*  GLUCOSE 173* 282* 136* 90 118*  BUN 76* 82* 106* 97* 53*  CREATININE 2.33* 2.60*  2.37* 2.22* 1.73*  CALCIUM 8.0*  --  7.6* 8.2* 8.4*  MG  --   --  1.6*  --   --   PHOS  --   --  2.6  --   --    Liver Function Tests:  Recent Labs Lab 09/29/14 1750 09/30/14 0650  AST 9* 7*  ALT 8* 6*  ALKPHOS 43 32*  BILITOT 0.7 0.2*  PROT 5.7* 5.0*  ALBUMIN 2.9* 2.7*   No results for input(s): LIPASE, AMYLASE in the last 168 hours.  Recent Labs Lab 09/29/14 1903 09/30/14 1850  AMMONIA 74* 53*   CBC:  Recent Labs Lab 09/29/14 1750  09/30/14 0650 09/30/14 1850 10/01/14 0326 10/01/14 1138 10/02/14 0500  WBC 12.2*  < > 9.5  11.4* 11.5* 11.1* 10.2  NEUTROABS 7.5  --   --   --   --   --   --   HGB 9.3*  < > 6.8* 10.5* 9.1* 9.6* 9.1*  HCT 29.6*  < > 21.0* 31.8* 28.2* 28.9* 27.8*  MCV 90.0  < > 90.9 91.1 90.7 92.0 90.3  PLT 352  < > 269 273 257 252 287  < > = values in this interval not displayed. Cardiac Enzymes:  Recent Labs Lab 09/29/14 2154 09/30/14 1850 10/01/14 0326  CKTOTAL 34*  --   --   TROPONINI  --  <0.03 <0.03   BNP (last 3 results) No results for input(s): BNP in the last 8760 hours.  ProBNP (last 3 results) No results for input(s): PROBNP in the last 8760 hours.  CBG:  Recent Labs Lab 10/01/14 1906 10/01/14 2351 10/02/14 0339 10/02/14 0750 10/02/14 1207  GLUCAP 145* 114* 96 113* 113*    Recent Results (from the past 240 hour(s))  MRSA PCR Screening     Status: None   Collection Time: 09/30/14  1:00 AM  Result Value Ref Range Status   MRSA by PCR NEGATIVE NEGATIVE Final    Comment:        The GeneXpert MRSA Assay (FDA approved for NASAL specimens only), is one component of a comprehensive MRSA colonization surveillance program. It is not intended to diagnose MRSA infection nor to guide or monitor treatment for MRSA infections.      Studies: No results found.  Scheduled Meds: . antiseptic oral rinse  7 mL Mouth Rinse q12n4p  . chlorhexidine  15 mL Mouth Rinse BID  . clonazePAM  0.25 mg Oral QHS  . haloperidol  2.5 mg Oral Daily  . haloperidol  5 mg Oral QHS  . insulin aspart  0-9 Units Subcutaneous 6 times per day  . lactulose  20 g Oral Daily  . OLANZapine  5 mg Oral BID  . oxybutynin  1 patch Transdermal Q72H  . [START ON 10/03/2014] pantoprazole (PROTONIX) IV  40 mg Intravenous Q12H  . sodium chloride  3 mL Intravenous Q12H  . tamsulosin  0.4 mg Oral Daily  . trihexyphenidyl  2.5 mg Oral Daily   Continuous Infusions: . sodium chloride 75 mL/hr (10/01/14 2016)  . pantoprozole (PROTONIX) infusion 8 mg/hr (10/02/14 0756)    Active Problems:    Schizophrenia, paranoid type   DM type 2 causing CKD stage 3   Chronic anemia   Acute encephalopathy   Hematemesis   Hyperammonemia    Time spent: 35 minutes.     Hartley Barefoot A  [facility] Pager [phone]. If 7PM-7AM, please contact night-coverage at [email or web address], password [facility] 10/02/2014, 1:51 PM  LOS: 3  days

## 2014-10-02 NOTE — Progress Notes (Signed)
Waldron Halfmann 3:00 PM  Subjective: Patient doing much better getting ready to eat solid food and no abdominal complaints or signs of bleeding and his case was discussed with his caretaker and her questions were answered  Objective: Vital signs stable afebrile no acute distress abdomen is soft nontender hemoglobin stable decreased BUN and creatinine  Assessment: GI bleeding secondary to Mallory-Weiss tear and erosive esophagitis  Plan: Continue twice a day pump inhibitors call me when necessary otherwise follow-up in one month in our office to set up repeat endoscopy and consider screening colonoscopy as well since he does have a history of polyps and they will try to bring me his previous colonoscopy report on follow-up  [facility] E  Pager [phone] After 5PM or if no answer call [phone]

## 2014-10-03 ENCOUNTER — Encounter (HOSPITAL_COMMUNITY): Payer: Self-pay | Admitting: Internal Medicine

## 2014-10-03 DIAGNOSIS — E7220 Disorder of urea cycle metabolism, unspecified: Secondary | ICD-10-CM

## 2014-10-03 DIAGNOSIS — D649 Anemia, unspecified: Secondary | ICD-10-CM

## 2014-10-03 DIAGNOSIS — K922 Gastrointestinal hemorrhage, unspecified: Secondary | ICD-10-CM

## 2014-10-03 DIAGNOSIS — K226 Gastro-esophageal laceration-hemorrhage syndrome: Secondary | ICD-10-CM

## 2014-10-03 DIAGNOSIS — K2210 Ulcer of esophagus without bleeding: Secondary | ICD-10-CM

## 2014-10-03 DIAGNOSIS — K920 Hematemesis: Secondary | ICD-10-CM

## 2014-10-03 DIAGNOSIS — G9340 Encephalopathy, unspecified: Secondary | ICD-10-CM

## 2014-10-03 DIAGNOSIS — N183 Chronic kidney disease, stage 3 (moderate): Secondary | ICD-10-CM

## 2014-10-03 DIAGNOSIS — N179 Acute kidney failure, unspecified: Secondary | ICD-10-CM

## 2014-10-03 DIAGNOSIS — E872 Acidosis, unspecified: Secondary | ICD-10-CM | POA: Diagnosis present

## 2014-10-03 DIAGNOSIS — E1122 Type 2 diabetes mellitus with diabetic chronic kidney disease: Secondary | ICD-10-CM

## 2014-10-03 DIAGNOSIS — K760 Fatty (change of) liver, not elsewhere classified: Secondary | ICD-10-CM

## 2014-10-03 DIAGNOSIS — F200 Paranoid schizophrenia: Secondary | ICD-10-CM

## 2014-10-03 DIAGNOSIS — R110 Nausea: Secondary | ICD-10-CM

## 2014-10-03 DIAGNOSIS — I10 Essential (primary) hypertension: Secondary | ICD-10-CM

## 2014-10-03 HISTORY — DX: Gastro-esophageal laceration-hemorrhage syndrome: K22.6

## 2014-10-03 HISTORY — DX: Fatty (change of) liver, not elsewhere classified: K76.0

## 2014-10-03 HISTORY — DX: Ulcer of esophagus without bleeding: K22.10

## 2014-10-03 LAB — CBC
HEMATOCRIT: 30.2 % — AB (ref 39.0–52.0)
HEMOGLOBIN: 9.8 g/dL — AB (ref 13.0–17.0)
MCH: 29.2 pg (ref 26.0–34.0)
MCHC: 32.5 g/dL (ref 30.0–36.0)
MCV: 89.9 fL (ref 78.0–100.0)
Platelets: 300 10*3/uL (ref 150–400)
RBC: 3.36 MIL/uL — ABNORMAL LOW (ref 4.22–5.81)
RDW: 13.2 % (ref 11.5–15.5)
WBC: 8.4 10*3/uL (ref 4.0–10.5)

## 2014-10-03 LAB — GLUCOSE, CAPILLARY
GLUCOSE-CAPILLARY: 144 mg/dL — AB (ref 65–99)
Glucose-Capillary: 110 mg/dL — ABNORMAL HIGH (ref 65–99)

## 2014-10-03 LAB — BASIC METABOLIC PANEL
ANION GAP: 6 (ref 5–15)
BUN: 28 mg/dL — ABNORMAL HIGH (ref 6–20)
CO2: 23 mmol/L (ref 22–32)
Calcium: 8.6 mg/dL — ABNORMAL LOW (ref 8.9–10.3)
Chloride: 114 mmol/L — ABNORMAL HIGH (ref 101–111)
Creatinine, Ser: 1.67 mg/dL — ABNORMAL HIGH (ref 0.61–1.24)
GFR calc Af Amer: 48 mL/min — ABNORMAL LOW (ref >60)
GFR, EST NON AFRICAN AMERICAN: 42 mL/min — AB (ref >60)
GLUCOSE: 114 mg/dL — AB (ref 65–99)
POTASSIUM: 4.1 mmol/L (ref 3.5–5.1)
Sodium: 143 mmol/L (ref 135–145)

## 2014-10-03 MED ORDER — PANTOPRAZOLE SODIUM 40 MG PO TBEC: 40.0000 mg | DELAYED_RELEASE_TABLET | Freq: Two times a day (BID) | ORAL | Status: DC

## 2014-10-03 MED ORDER — CLONAZEPAM 0.5 MG PO TABS: 0.2500 mg | ORAL_TABLET | ORAL | Status: DC

## 2014-10-03 MED ORDER — MORPHINE SULFATE ER 15 MG PO TBCR: 15.0000 mg | EXTENDED_RELEASE_TABLET | Freq: Two times a day (BID) | ORAL | Status: DC | PRN

## 2014-10-03 MED ORDER — PANTOPRAZOLE SODIUM 40 MG PO TBEC
40.0000 mg | DELAYED_RELEASE_TABLET | Freq: Every day | ORAL | Status: DC
  Administered 2014-10-03: 40 mg via ORAL
  Filled 2014-10-03: qty 1

## 2014-10-03 NOTE — Discharge Summary (Signed)
Physician Discharge Summary  David Lang ZOX:[id number] DOB: [birth year] DOA: 09/29/2014  PCP: David Jenny, MD  Admit date: 09/29/2014 Discharge date: 10/03/2014   Recommendations for Outpatient Follow-Up:   1. Recommend repeat CBC in 1 week to ensure stability. 2. F/U with Dr. Ewing Schlein in 1 month.   Discharge Diagnosis:   Principal Problem:   Hematemesis Active Problems:   Hypertension   Schizophrenia, paranoid type   DM type 2 causing CKD stage 3   Chronic anemia   Acute renal failure superimposed on stage 3 chronic kidney disease   Acute encephalopathy   Hyperammonemia   Bleeding gastrointestinal   Mallory-Weiss tear   Discharge disposition: [facility]  Discharge Condition: Improved.  Diet recommendation: Low sodium, heart healthy.  Carbohydrate-modified.    History of Present Illness:   David Lang is an 64 y.o. male with PMH of hypertension, diabetes, and schizophrenia who was admitted from his SNF with a chief complaint of hematemesis secondary to a Mallory-Weiss tear found on EGD.  Hospital Course by Problem:   Principal problem:  Hematemesis, Upper GI bleed. : Mallory-Weiss Tear and severe distal and mid ulcerative esophagitis. .  - S/P 2 units PRBC transfusion 09/30/14. - S/P endoscopy 9-04 which showed Mallory-Weiss Tear and severe distal and mid ulcerative esophagitis. - Diet advanced 10/01/14. - D/C on PPI BID. Needs to follow up with Dr Ewing Schlein in 1 month. Need endoscopy in 2 to 3 months.   Active problems: Anemia; acute blood loss. In setting of GI bleed.  - S/P 2 units of PRBC on 09/30/14.  - Hemoglobin stable at 9.8 at discharge.  Acute encephalopathy; Suspect to be multifactorial, acute illness, hypotension, elevated ammonia.  - TSH 0.361, B 12: 439.  - Resolved, hyperammonemia responded to lactulose.   Lactic acidosis; in setting hypoperfusion.  - Repeat lactic acid normal. Resolved.   DM type 2 causing CKD stage 3 -Treated with  SSI while in the hospital.   CKD stage 3 - Creatinine back to usual baseline values with hydration.  Schizophrenia, paranoid type  - Continue home medications including Haldol and Zyprexa.  Hyperammonemia - LFT not elevated. RUQ US showed liver steatosis. Hepatitis panel negative. - Ammonia level now WNL after being treated with Lactulose. Might be related to GI bleed, increase BUN.    Medical Consultants:   Vida Rigger, MD, Gastroenterology   Discharge Exam:   Filed Vitals:   10/03/14 0658  BP: 133/66  Pulse: 76  Temp: 98.2 F (36.8 C)  Resp: 16   Filed Vitals:   10/02/14 0519 10/02/14 1544 10/02/14 2100 10/03/14 0658  BP: 138/73 150/71 149/79 133/66  Pulse: 78 79 80 76  Temp: 98.7 F (37.1 C) 97.7 F (36.5 C) 98 F (36.7 C) 98.2 F (36.8 C)  TempSrc: Oral Oral Oral Oral  Resp: 14 16 14 16   Height:      Weight:      SpO2: 100% 99% 100% 96%    Gen:  NAD Cardiovascular:  RRR, No M/R/G Respiratory: Lungs CTAB Gastrointestinal: Abdomen soft, NT/ND with normal active bowel sounds. Extremities: No C/E/C   The results of significant diagnostics from this hospitalization (including imaging, microbiology, ancillary and laboratory) are listed below for reference.     Procedures and Diagnostic Studies:   Dg Chest 2 View  09/29/2014   CLINICAL DATA:  Hematemesis.  EXAM: CHEST  2 VIEW  COMPARISON:  08/19/2013  FINDINGS: Cardiac enlargement unchanged. Negative for heart failure. Left lower lobe atelectasis.  Elevated  right hemidiaphragm with mild right lower lobe atelectasis or scarring, unchanged from the prior study.  Negative for pneumonia or mass lesion.  IMPRESSION: Cardiac enlargement with mild left lower lobe atelectasis.  Elevated right hemidiaphragm with mild right lower lobe chronic atelectasis or scarring.   Electronically Signed   By: Marlan Palau M.D.   On: 09/29/2014 21:52   US Abdomen Limited Ruq  09/30/2014   CLINICAL DATA:  Elevated ammonia. History of  hypertension and diabetes.  EXAM: US ABDOMEN LIMITED - RIGHT UPPER QUADRANT  COMPARISON:  None.  FINDINGS: Gallbladder:  No gallstones or wall thickening visualized. No sonographic Murphy sign noted.  Common bile duct:  Diameter: 3.7 mm  Liver:  Mildly echogenic.  Two right renal cysts are noted. The largest is arising from the upper pole, measuring 6.5 cm in maximum diameter. There is also a small right pleural effusion.  IMPRESSION: 1. Mildly echogenic liver, most likely due to steatosis. 2. Small right pleural effusion. 3. Right renal cysts.   Electronically Signed   By: Beckie Salts M.D.   On: 09/30/2014 12:37      Labs:   Basic Metabolic Panel:  Recent Labs Lab 09/29/14 1750 09/29/14 1940 09/30/14 0650 10/01/14 0326 10/02/14 0500 10/03/14 0550  NA 139 139 143 144 144 143  K 4.4 4.3 4.8 4.3 4.1 4.1  CL 105 105 115* 117* 117* 114*  CO2 27  --  21* 22 21* 23  GLUCOSE 173* 282* 136* 90 118* 114*  BUN 76* 82* 106* 97* 53* 28*  CREATININE 2.33* 2.60* 2.37* 2.22* 1.73* 1.67*  CALCIUM 8.0*  --  7.6* 8.2* 8.4* 8.6*  MG  --   --  1.6*  --   --   --   PHOS  --   --  2.6  --   --   --    GFR Estimated Creatinine Clearance: 52 mL/min (by C-G formula based on Cr of 1.67). Liver Function Tests:  Recent Labs Lab 09/29/14 1750 09/30/14 0650  AST 9* 7*  ALT 8* 6*  ALKPHOS 43 32*  BILITOT 0.7 0.2*  PROT 5.7* 5.0*  ALBUMIN 2.9* 2.7*    Recent Labs Lab 09/29/14 1903 09/30/14 1850 10/02/14 1335  AMMONIA 74* 53* 15   Coagulation profile  Recent Labs Lab 09/29/14 1750 09/30/14 0650  INR 1.27 1.34    CBC:  Recent Labs Lab 09/29/14 1750  09/30/14 1850 10/01/14 0326 10/01/14 1138 10/02/14 0500 10/03/14 0550  WBC 12.2*  < > 11.4* 11.5* 11.1* 10.2 8.4  NEUTROABS 7.5  --   --   --   --   --   --   HGB 9.3*  < > 10.5* 9.1* 9.6* 9.1* 9.8*  HCT 29.6*  < > 31.8* 28.2* 28.9* 27.8* 30.2*  MCV 90.0  < > 91.1 90.7 92.0 90.3 89.9  PLT 352  < > 273 257 252 287 300  < > =  values in this interval not displayed. Cardiac Enzymes:  Recent Labs Lab 09/29/14 2154 09/30/14 1850 10/01/14 0326  CKTOTAL 34*  --   --   TROPONINI  --  <0.03 <0.03   BNP: Invalid input(s): POCBNP CBG:  Recent Labs Lab 10/02/14 0750 10/02/14 1207 10/02/14 1540 10/02/14 2139 10/03/14 0754  GLUCAP 113* 113* 109* 144* 110*   Microbiology Recent Results (from the past 240 hour(s))  MRSA PCR Screening     Status: None   Collection Time: 09/30/14  1:00 AM  Result Value Ref Range  Status   MRSA by PCR NEGATIVE NEGATIVE Final    Comment:        The GeneXpert MRSA Assay (FDA approved for NASAL specimens only), is one component of a comprehensive MRSA colonization surveillance program. It is not intended to diagnose MRSA infection nor to guide or monitor treatment for MRSA infections.      Discharge Instructions:       Discharge Instructions    Call MD for:  extreme fatigue    Complete by:  As directed      Call MD for:  persistant dizziness or light-headedness    Complete by:  As directed      Call MD for:  persistant nausea and vomiting    Complete by:  As directed      Diet - low sodium heart healthy    Complete by:  As directed      Increase activity slowly    Complete by:  As directed             Medication List    STOP taking these medications        aspirin EC 81 MG tablet     ranitidine 150 MG tablet  Commonly known as:  ZANTAC      TAKE these medications        amLODipine 10 MG tablet  Commonly known as:  NORVASC  Take 1 tablet (10 mg total) by mouth daily.     atenolol 100 MG tablet  Commonly known as:  TENORMIN  Take 1 tablet (100 mg total) by mouth daily.     cetirizine 10 MG tablet  Commonly known as:  ZYRTEC  Take 10 mg by mouth daily as needed for allergies.     clonazePAM 0.5 MG tablet  Commonly known as:  KLONOPIN  Take 0.5 tablets (0.25 mg total) by mouth as directed. Take 0.25 mg scheduled once every evening, and may  also take 0.25mg  once in the morning as needed for anxiety.     cloNIDine 0.3 mg/24hr patch  Commonly known as:  CATAPRES - Dosed in mg/24 hr  Place 1 patch (0.3 mg total) onto the skin once a week.     cyanocobalamin 1000 MCG tablet  Take 1 tablet (1,000 mcg total) by mouth daily.     haloperidol 5 MG tablet  Commonly known as:  HALDOL  Take 2.5-5 mg by mouth 2 (two) times daily. Take 2.5mg  in the morning and then take  daily at night     haloperidol decanoate 100 MG/ML injection  Commonly known as:  HALDOL DECANOATE  Inject 100 mg into the muscle every 28 (twenty-eight) days.     hydrALAZINE 50 MG tablet  Commonly known as:  APRESOLINE  Take 75 mg by mouth 3 (three) times daily.     lisinopril 20 MG tablet  Commonly known as:  PRINIVIL,ZESTRIL  Take 20 mg by mouth daily.     loperamide 2 MG capsule  Commonly known as:  IMODIUM  Take 2 mg by mouth 3 (three) times daily as needed for diarrhea or loose stools.     morphine 15 MG 12 hr tablet  Commonly known as:  MS CONTIN  Take 1 tablet (15 mg total) by mouth every 12 (twelve) hours as needed for pain.     OLANZapine 5 MG tablet  Commonly known as:  ZYPREXA  Take 5 mg by mouth 2 (two) times daily. And may take 1 additional dose of  as needed  for pychosis     ondansetron 4 MG tablet  Commonly known as:  ZOFRAN  Take 1 tablet (4 mg total) by mouth every 6 (six) hours as needed for nausea.     OYSTER SHELL CALCIUM PO  Take 1 tablet by mouth daily.     pantoprazole 40 MG tablet  Commonly known as:  PROTONIX  Take 1 tablet (40 mg total) by mouth 2 (two) times daily.     spironolactone 25 MG tablet  Commonly known as:  ALDACTONE  Take 12.5 mg by mouth daily.     tamsulosin 0.4 MG Caps capsule  Commonly known as:  FLOMAX  Take 0.4 mg by mouth.     trihexyphenidyl 5 MG tablet  Commonly known as:  ARTANE  Take 2.5 mg by mouth daily.       Follow-up Information    Follow up with [facility] E, MD On  11/15/2014.   Specialty:  Gastroenterology   Why:  Please follow up with Dr. Ewing Schlein on Thursday, October 20th at 9:15am   Contact information:   [address] [place] [address] [phone]        Time coordinating discharge: 35 minutes.  Signed:  Vian Fluegel  Pager [phone] [facility] 10/03/2014, 9:59 AM

## 2014-10-03 NOTE — Progress Notes (Signed)
Clinical Social Work  CSW faxed DC summary and FL2 to [facility] and spoke with [place] who is agreeable to accept patient back today. CSW prepared DC packet with DC summary, FL2, and hard scripts included. CSW informed patient of DC and he asked that CSW call his sisters and caregiver (Diane). CSW left a message with sister [name]) and spoke with sister Melton Krebs) and caregiver who are aware of DC and prefer [facility]. RN aware and agreeable to DC.  [facility] requested for 12:30pm pick up. Request #: [id number].  CSW is signing off but available if needed.  [place], [place] [phone]

## 2014-10-09 ENCOUNTER — Inpatient Hospital Stay (HOSPITAL_COMMUNITY)
Admission: EM | Admit: 2014-10-09 | Discharge: 2014-10-15 | DRG: 871 | Disposition: A | Discharge status: Nursing Home (Includes ICF) | Payer: BLUE CROSS/BLUE SHIELD | Attending: Internal Medicine | Admitting: Internal Medicine

## 2014-10-09 ENCOUNTER — Emergency Department (HOSPITAL_COMMUNITY): Payer: BLUE CROSS/BLUE SHIELD

## 2014-10-09 ENCOUNTER — Encounter (HOSPITAL_COMMUNITY): Payer: Self-pay | Admitting: Emergency Medicine

## 2014-10-09 DIAGNOSIS — Z79899 Other long term (current) drug therapy: Secondary | ICD-10-CM

## 2014-10-09 DIAGNOSIS — R197 Diarrhea, unspecified: Secondary | ICD-10-CM | POA: Diagnosis present

## 2014-10-09 DIAGNOSIS — Z96642 Presence of left artificial hip joint: Secondary | ICD-10-CM | POA: Diagnosis present

## 2014-10-09 DIAGNOSIS — K760 Fatty (change of) liver, not elsewhere classified: Secondary | ICD-10-CM | POA: Diagnosis present

## 2014-10-09 DIAGNOSIS — E861 Hypovolemia: Secondary | ICD-10-CM | POA: Diagnosis present

## 2014-10-09 DIAGNOSIS — E1122 Type 2 diabetes mellitus with diabetic chronic kidney disease: Secondary | ICD-10-CM | POA: Diagnosis not present

## 2014-10-09 DIAGNOSIS — R0902 Hypoxemia: Secondary | ICD-10-CM | POA: Diagnosis present

## 2014-10-09 DIAGNOSIS — G9340 Encephalopathy, unspecified: Secondary | ICD-10-CM | POA: Diagnosis present

## 2014-10-09 DIAGNOSIS — E872 Acidosis, unspecified: Secondary | ICD-10-CM | POA: Diagnosis present

## 2014-10-09 DIAGNOSIS — Z8711 Personal history of peptic ulcer disease: Secondary | ICD-10-CM

## 2014-10-09 DIAGNOSIS — N179 Acute kidney failure, unspecified: Secondary | ICD-10-CM | POA: Diagnosis present

## 2014-10-09 DIAGNOSIS — N390 Urinary tract infection, site not specified: Secondary | ICD-10-CM | POA: Diagnosis present

## 2014-10-09 DIAGNOSIS — Z8249 Family history of ischemic heart disease and other diseases of the circulatory system: Secondary | ICD-10-CM

## 2014-10-09 DIAGNOSIS — I959 Hypotension, unspecified: Secondary | ICD-10-CM | POA: Diagnosis present

## 2014-10-09 DIAGNOSIS — N183 Chronic kidney disease, stage 3 unspecified: Secondary | ICD-10-CM | POA: Diagnosis present

## 2014-10-09 DIAGNOSIS — R0602 Shortness of breath: Secondary | ICD-10-CM

## 2014-10-09 DIAGNOSIS — N170 Acute kidney failure with tubular necrosis: Secondary | ICD-10-CM | POA: Diagnosis present

## 2014-10-09 DIAGNOSIS — Z79891 Long term (current) use of opiate analgesic: Secondary | ICD-10-CM

## 2014-10-09 DIAGNOSIS — Z8719 Personal history of other diseases of the digestive system: Secondary | ICD-10-CM

## 2014-10-09 DIAGNOSIS — F200 Paranoid schizophrenia: Secondary | ICD-10-CM | POA: Diagnosis present

## 2014-10-09 DIAGNOSIS — I10 Essential (primary) hypertension: Secondary | ICD-10-CM | POA: Diagnosis not present

## 2014-10-09 DIAGNOSIS — R509 Fever, unspecified: Secondary | ICD-10-CM

## 2014-10-09 DIAGNOSIS — D649 Anemia, unspecified: Secondary | ICD-10-CM | POA: Diagnosis present

## 2014-10-09 DIAGNOSIS — Z833 Family history of diabetes mellitus: Secondary | ICD-10-CM | POA: Diagnosis not present

## 2014-10-09 DIAGNOSIS — I129 Hypertensive chronic kidney disease with stage 1 through stage 4 chronic kidney disease, or unspecified chronic kidney disease: Secondary | ICD-10-CM | POA: Diagnosis present

## 2014-10-09 DIAGNOSIS — N136 Pyonephrosis: Secondary | ICD-10-CM

## 2014-10-09 DIAGNOSIS — A419 Sepsis, unspecified organism: Secondary | ICD-10-CM | POA: Diagnosis not present

## 2014-10-09 DIAGNOSIS — R55 Syncope and collapse: Secondary | ICD-10-CM | POA: Diagnosis present

## 2014-10-09 DIAGNOSIS — B9620 Unspecified Escherichia coli [E. coli] as the cause of diseases classified elsewhere: Secondary | ICD-10-CM | POA: Diagnosis present

## 2014-10-09 LAB — AMMONIA: Ammonia: 40 umol/L — ABNORMAL HIGH (ref 9–35)

## 2014-10-09 LAB — CBC WITH DIFFERENTIAL/PLATELET
BASOS ABS: 0 10*3/uL (ref 0.0–0.1)
Basophils Relative: 0 % (ref 0–1)
EOS ABS: 0 10*3/uL (ref 0.0–0.7)
Eosinophils Relative: 0 % (ref 0–5)
HCT: 25.3 % — ABNORMAL LOW (ref 39.0–52.0)
Hemoglobin: 8.5 g/dL — ABNORMAL LOW (ref 13.0–17.0)
LYMPHS PCT: 8 % — AB (ref 12–46)
Lymphs Abs: 2.2 10*3/uL (ref 0.7–4.0)
MCH: 29.5 pg (ref 26.0–34.0)
MCHC: 33.6 g/dL (ref 30.0–36.0)
MCV: 87.8 fL (ref 78.0–100.0)
MONO ABS: 2.2 10*3/uL — AB (ref 0.1–1.0)
Monocytes Relative: 8 % (ref 3–12)
NEUTROS PCT: 84 % — AB (ref 43–77)
Neutro Abs: 23.2 10*3/uL — ABNORMAL HIGH (ref 1.7–7.7)
PLATELETS: 415 10*3/uL — AB (ref 150–400)
RBC: 2.88 MIL/uL — ABNORMAL LOW (ref 4.22–5.81)
RDW: 13.5 % (ref 11.5–15.5)
WBC: 27.6 10*3/uL — AB (ref 4.0–10.5)

## 2014-10-09 LAB — URINALYSIS, ROUTINE W REFLEX MICROSCOPIC
GLUCOSE, UA: NEGATIVE mg/dL
KETONES UR: NEGATIVE mg/dL
Nitrite: NEGATIVE
PH: 5 (ref 5.0–8.0)
Protein, ur: 100 mg/dL — AB
SPECIFIC GRAVITY, URINE: 1.017 (ref 1.005–1.030)
Urobilinogen, UA: 1 mg/dL (ref 0.0–1.0)

## 2014-10-09 LAB — COMPREHENSIVE METABOLIC PANEL
ALBUMIN: 3.1 g/dL — AB (ref 3.5–5.0)
ALT: 8 U/L — AB (ref 17–63)
AST: 14 U/L — AB (ref 15–41)
Alkaline Phosphatase: 46 U/L (ref 38–126)
Anion gap: 11 (ref 5–15)
BUN: 41 mg/dL — AB (ref 6–20)
CHLORIDE: 104 mmol/L (ref 101–111)
CO2: 19 mmol/L — AB (ref 22–32)
CREATININE: 3.19 mg/dL — AB (ref 0.61–1.24)
Calcium: 8.4 mg/dL — ABNORMAL LOW (ref 8.9–10.3)
GFR calc non Af Amer: 19 mL/min — ABNORMAL LOW (ref >60)
GFR, EST AFRICAN AMERICAN: 22 mL/min — AB (ref >60)
GLUCOSE: 175 mg/dL — AB (ref 65–99)
Potassium: 4.2 mmol/L (ref 3.5–5.1)
SODIUM: 134 mmol/L — AB (ref 135–145)
Total Bilirubin: 0.7 mg/dL (ref 0.3–1.2)
Total Protein: 6.5 g/dL (ref 6.5–8.1)

## 2014-10-09 LAB — URINE MICROSCOPIC-ADD ON

## 2014-10-09 LAB — I-STAT CG4 LACTIC ACID, ED: Lactic Acid, Venous: 1.18 mmol/L (ref 0.5–2.0)

## 2014-10-09 MED ORDER — ONDANSETRON HCL 4 MG/2ML IJ SOLN: 4.0000 mg | Freq: Four times a day (QID) | INTRAMUSCULAR | Status: DC | PRN

## 2014-10-09 MED ORDER — SODIUM CHLORIDE 0.9 % IV BOLUS (SEPSIS)
1000.0000 mL | INTRAVENOUS | Status: AC
  Administered 2014-10-09 (×2): 1000 mL via INTRAVENOUS

## 2014-10-09 MED ORDER — SODIUM CHLORIDE 0.9 % IV BOLUS (SEPSIS)
1000.0000 mL | Freq: Once | INTRAVENOUS | Status: AC
Start: 2014-10-09 — End: 2014-10-09
  Administered 2014-10-09: 1000 mL via INTRAVENOUS

## 2014-10-09 MED ORDER — SODIUM CHLORIDE 0.9 % IV BOLUS (SEPSIS)
500.0000 mL | INTRAVENOUS | Status: AC
  Administered 2014-10-09: 500 mL via INTRAVENOUS

## 2014-10-09 MED ORDER — ONDANSETRON HCL 4 MG PO TABS: 4.0000 mg | ORAL_TABLET | Freq: Four times a day (QID) | ORAL | Status: DC | PRN

## 2014-10-09 MED ORDER — SODIUM CHLORIDE 0.9 % IV BOLUS (SEPSIS): 500.0000 mL | INTRAVENOUS | Status: AC

## 2014-10-09 MED ORDER — HEPARIN SODIUM (PORCINE) 5000 UNIT/ML IJ SOLN
5000.0000 [IU] | Freq: Three times a day (TID) | INTRAMUSCULAR | Status: DC
  Administered 2014-10-10 – 2014-10-15 (×17): 5000 [IU] via SUBCUTANEOUS
  Filled 2014-10-09 (×20): qty 1

## 2014-10-09 MED ORDER — PIPERACILLIN-TAZOBACTAM 3.375 G IVPB 30 MIN
3.3750 g | Freq: Once | INTRAVENOUS | Status: AC
  Administered 2014-10-10: 3.375 g via INTRAVENOUS
  Filled 2014-10-09: qty 50

## 2014-10-09 MED ORDER — VANCOMYCIN HCL IN DEXTROSE 1-5 GM/200ML-% IV SOLN
1000.0000 mg | Freq: Once | INTRAVENOUS | Status: AC
  Administered 2014-10-10: 1000 mg via INTRAVENOUS
  Filled 2014-10-09: qty 200

## 2014-10-09 MED ORDER — SODIUM CHLORIDE 0.9 % IV BOLUS (SEPSIS)
1000.0000 mL | INTRAVENOUS | Status: AC
  Administered 2014-10-10: 1000 mL via INTRAVENOUS

## 2014-10-09 MED ORDER — ACETAMINOPHEN 325 MG PO TABS
650.0000 mg | ORAL_TABLET | Freq: Once | ORAL | Status: AC
  Administered 2014-10-09: 650 mg via ORAL
  Filled 2014-10-09: qty 2

## 2014-10-09 MED ORDER — DEXTROSE 5 % IV SOLN
1.0000 g | Freq: Once | INTRAVENOUS | Status: AC
  Administered 2014-10-09: 1 g via INTRAVENOUS
  Filled 2014-10-09: qty 10

## 2014-10-09 MED ORDER — SODIUM CHLORIDE 0.9 % IV SOLN
INTRAVENOUS | Status: DC
  Administered 2014-10-10 (×2): via INTRAVENOUS

## 2014-10-09 MED ORDER — LEVALBUTEROL HCL 0.63 MG/3ML IN NEBU: 0.6300 mg | INHALATION_SOLUTION | Freq: Four times a day (QID) | RESPIRATORY_TRACT | Status: DC | PRN

## 2014-10-09 MED ORDER — SODIUM CHLORIDE 0.9 % IJ SOLN
3.0000 mL | Freq: Two times a day (BID) | INTRAMUSCULAR | Status: DC
  Administered 2014-10-10 – 2014-10-15 (×5): 3 mL via INTRAVENOUS

## 2014-10-09 MED ORDER — ACETAMINOPHEN 325 MG PO TABS
650.0000 mg | ORAL_TABLET | Freq: Four times a day (QID) | ORAL | Status: DC | PRN
  Administered 2014-10-10 – 2014-10-12 (×5): 650 mg via ORAL
  Filled 2014-10-09 (×6): qty 2

## 2014-10-09 MED ORDER — ACETAMINOPHEN 650 MG RE SUPP: 650.0000 mg | Freq: Four times a day (QID) | RECTAL | Status: DC | PRN

## 2014-10-09 MED ORDER — SENNOSIDES-DOCUSATE SODIUM 8.6-50 MG PO TABS: 1.0000 | ORAL_TABLET | Freq: Every evening | ORAL | Status: DC | PRN

## 2014-10-09 NOTE — ED Notes (Signed)
Pt's oxygen does drop depending on how he lays in bed,  [facility] PA is aware and she states to readjust pt,  When this is done his oxygen level increases and then he slumps back down in bed and it decreases again,  No new order given .

## 2014-10-09 NOTE — ED Notes (Addendum)
Pt from [facility] dementia unit. Pt has hx of dementia. Staff report pt had a near fall this evening. Pt was assisted to floor by the staff. Staff unsure if pt had a syncopal episode as they were assisting pt from behind. Pt denies syncopal episode, states he just sat down. Pt normally walks with walker and 2 assist. Pt oriented per norm according to facility. Pt had a fever in triage

## 2014-10-09 NOTE — ED Notes (Signed)
Report received,  Pt is septic,  He has a 24 IV,  This Clinical research associate order IV team consult.

## 2014-10-09 NOTE — ED Provider Notes (Signed)
CSN: [id number]     Arrival date & time 10/09/14  1756 History   First MD Initiated Contact with Patient 10/09/14 1820     Chief Complaint  Patient presents with  . Near Syncope  . Fever     (Consider location/radiation/quality/duration/timing/severity/associated sxs/prior Treatment) Patient is a 64 y.o. male presenting with near-syncope and fever. The history is provided by the patient and the EMS personnel. No language interpreter was used.  Near Syncope Associated symptoms include a fever. Pertinent negatives include no chest pain.  Fever Associated symptoms: diarrhea   Associated symptoms: no chest pain   David Lang is a 64 y.o male with a history of hypertension, diabetes, and schizophrenia who presents for several episodes of diarrhea 3 days. He denies any blood in the diarrhea. He states that he is here to replenish his fluids. He is difficult to interview secondary to schizophrenia. He either ignores questions or does not answer them appropriately. Nursing note states that he is here from [facility] and the staff reported that he had a near syncopal episode earlier today. He normally ambulates with a walker and is assisted. According to nursing facility he is at baseline.  He denies any fever, chest pain, shortness of breath, abdominal pain, nausea, vomiting, or leg swelling. Level V caveat.  Past Medical History  Diagnosis Date  . Hypertension   . Diabetes mellitus without complication   . Mental disorder   . Schizophrenia   . Aspiration pneumonia 08/21/2013  . Hip fracture 03/30/2012  . Periprosthetic hip fracture--nondisplaced, Left 08/05/2013  . Subcapital fracture of neck of femur 03/31/2012  . Mallory-Weiss tear 10/03/2014  . Hepatic steatosis 10/03/2014  . Ulcerative esophagitis 10/03/2014  . Renal cyst, right    Past Surgical History  Procedure Laterality Date  . Hip arthroplasty Left 03/31/2012    Procedure: ARTHROPLASTY BIPOLAR HIP;  Surgeon: Mable Paris, MD;  Location: [facility] ORS;  Service: Orthopedics;  Laterality: Left;  . Esophagogastroduodenoscopy (egd) with propofol N/A 09/30/2014    Procedure: ESOPHAGOGASTRODUODENOSCOPY (EGD) WITH PROPOFOL;  Surgeon: Vida Rigger, MD;  Location: [facility] ENDOSCOPY;  Service: Endoscopy;  Laterality: N/A;   Family History  Problem Relation Age of Onset  . Diabetes type II Mother   . Hypertension Father    Social History  Substance Use Topics  . Smoking status: Never Smoker   . Smokeless tobacco: Never Used  . Alcohol Use: No     Comment: not answering    Review of Systems  Unable to perform ROS: Dementia  Constitutional: Positive for fever.  Respiratory: Negative for shortness of breath.   Cardiovascular: Positive for near-syncope. Negative for chest pain.  Gastrointestinal: Positive for diarrhea.      Allergies  Review of patient's allergies indicates no known allergies.  Home Medications   Prior to Admission medications   Medication Sig Start Date End Date Taking? Authorizing Provider  amLODipine (NORVASC) 10 MG tablet Take 1 tablet (10 mg total) by mouth daily. 04/18/12  Yes Adeline Joselyn Glassman, MD  atenolol (TENORMIN) 100 MG tablet Take 1 tablet (100 mg total) by mouth daily. 04/18/12  Yes Adeline Joselyn Glassman, MD  cetirizine (ZYRTEC) 10 MG tablet Take 10 mg by mouth daily as needed for allergies.    Yes Historical Provider, MD  clonazePAM (KLONOPIN) 0.5 MG tablet Take 0.5 tablets (0.25 mg total) by mouth as directed. Take 0.25 mg scheduled once every evening, and may also take 0.25mg  once in the morning as needed for anxiety.  10/03/14  Yes Christina P Rama, MD  cloNIDine (CATAPRES - DOSED IN MG/24 HR) 0.3 mg/24hr Place 1 patch (0.3 mg total) onto the skin once a week. 04/18/12  Yes Adeline Joselyn Glassman, MD  haloperidol (HALDOL) 5 MG tablet Take 2.5-5 mg by mouth 2 (two) times daily. Take 2.5mg  in the morning and then take 5mg  daily at night   Yes Historical Provider, MD  haloperidol decanoate (HALDOL  DECANOATE) 100 MG/ML injection Inject 100 mg into the muscle every 28 (twenty-eight) days.   Yes Historical Provider, MD  hydrALAZINE (APRESOLINE) 50 MG tablet Take 75 mg by mouth 3 (three) times daily.    Yes Historical Provider, MD  lisinopril (PRINIVIL,ZESTRIL) 20 MG tablet Take 20 mg by mouth daily.   Yes Historical Provider, MD  loperamide (IMODIUM) 2 MG capsule Take 2 mg by mouth 3 (three) times daily as needed for diarrhea or loose stools.   Yes Historical Provider, MD  morphine (MS CONTIN) 15 MG 12 hr tablet Take 1 tablet (15 mg total) by mouth every 12 (twelve) hours as needed for pain. 10/03/14  Yes Christina P Rama, MD  OLANZapine (ZYPREXA) 5 MG tablet Take 5 mg by mouth 2 (two) times daily. And may take 1 additional dose of 5mg  as needed for pychosis   Yes Historical Provider, MD  ondansetron (ZOFRAN) 4 MG tablet Take 1 tablet (4 mg total) by mouth every 6 (six) hours as needed for nausea. 04/18/12  Yes Adeline Joselyn Glassman, MD  OYSTER SHELL CALCIUM PO Take 500 mg by mouth daily.    Yes Historical Provider, MD  pantoprazole (PROTONIX) 40 MG tablet Take 1 tablet (40 mg total) by mouth 2 (two) times daily. 10/03/14  Yes Maryruth Bun Rama, MD  spironolactone (ALDACTONE) 25 MG tablet Take 12.5 mg by mouth daily.   Yes Historical Provider, MD  tamsulosin (FLOMAX) 0.4 MG CAPS capsule Take 0.4 mg by mouth.   Yes Historical Provider, MD  trihexyphenidyl (ARTANE) 5 MG tablet Take 2.5 mg by mouth daily.    Yes Historical Provider, MD  vitamin B-12 1000 MCG tablet Take 1 tablet (1,000 mcg total) by mouth daily. 04/18/12  Yes Adeline C Viyuoh, MD   BP 119/73 mmHg  Pulse 84  Temp(Src) 98.6 F (37 C) (Oral)  Resp 18  SpO2 100% Physical Exam  Constitutional: He is oriented to person, place, and time. He appears well-developed and well-nourished. No distress.  HENT:  Head: Normocephalic and atraumatic.  No signs of head trauma.  Eyes: Conjunctivae are normal.  Neck: Normal range of motion. Neck supple.   Cardiovascular: Normal rate, regular rhythm and normal heart sounds.   Pulmonary/Chest: Effort normal and breath sounds normal. No respiratory distress. He has no wheezes. He has no rales.  Lungs clear to auscultation bilaterally.  Abdominal: Soft. He exhibits no distension. There is no tenderness. There is no rebound and no guarding.  Abdomen is soft and nontender. No distention. No guarding or rebound.  Musculoskeletal: Normal range of motion.  Neurological: He is alert and oriented to person, place, and time.  Skin: Skin is warm and dry.  Nursing note and vitals reviewed.   ED Course  Procedures (including critical care time) Labs Review Labs Reviewed  CBC WITH DIFFERENTIAL/PLATELET - Abnormal; Notable for the following:    WBC 27.6 (*)    RBC 2.88 (*)    Hemoglobin 8.5 (*)    HCT 25.3 (*)    Platelets 415 (*)    Neutrophils Relative %  84 (*)    Lymphocytes Relative 8 (*)    Neutro Abs 23.2 (*)    Monocytes Absolute 2.2 (*)    All other components within normal limits  COMPREHENSIVE METABOLIC PANEL - Abnormal; Notable for the following:    Sodium 134 (*)    CO2 19 (*)    Glucose, Bld 175 (*)    BUN 41 (*)    Creatinine, Ser 3.19 (*)    Calcium 8.4 (*)    Albumin 3.1 (*)    AST 14 (*)    ALT 8 (*)    GFR calc non Af Amer 19 (*)    GFR calc Af Amer 22 (*)    All other components within normal limits  URINALYSIS, ROUTINE W REFLEX MICROSCOPIC (NOT AT [facility]) - Abnormal; Notable for the following:    Color, Urine RED (*)    APPearance TURBID (*)    Hgb urine dipstick LARGE (*)    Bilirubin Urine MODERATE (*)    Protein, ur 100 (*)    Leukocytes, UA LARGE (*)    All other components within normal limits  AMMONIA - Abnormal; Notable for the following:    Ammonia 40 (*)    All other components within normal limits  URINE MICROSCOPIC-ADD ON - Abnormal; Notable for the following:    Bacteria, UA MANY (*)    All other components within normal limits  CULTURE, BLOOD  (ROUTINE X 2)  C DIFFICILE QUICK SCREEN W PCR REFLEX  CULTURE, BLOOD (ROUTINE X 2)  URINE CULTURE  CBC  CREATININE, SERUM  MAGNESIUM  TROPONIN I  TROPONIN I  TROPONIN I  I-STAT CG4 LACTIC ACID, ED  I-STAT CG4 LACTIC ACID, ED    Imaging Review Dg Chest Port 1 View  10/09/2014   CLINICAL DATA:  64 year old male with fever  EXAM: PORTABLE CHEST - 1 VIEW  COMPARISON:  Chest radiograph dated 09/29/2014  FINDINGS: Single-view of the chest demonstrate minimal atelectatic changes of the left lung base. There is no focal consolidation, pleural effusion, or pneumothorax. Stable cardiac silhouette. The osseous structures are grossly unremarkable.  IMPRESSION: Left lung base subsegmental atelectasis.  No focal consolidation.   Electronically Signed   By: Elgie Collard M.D.   On: 10/09/2014 20:13   I have personally reviewed and evaluated these images and lab results as part of my medical decision-making.   EKG Interpretation None     CRITICAL CARE Performed by: Catha Gosselin   Total critical care time: 35  Critical care time was exclusive of separately billable procedures and treating other patients. Critical care was necessary to treat or prevent imminent or life-threatening deterioration. Critical care was time spent personally by me on the following activities: development of treatment plan with patient and/or surrogate as well as nursing, discussions with consultants, evaluation of patient's response to treatment, examination of patient, obtaining history from patient or surrogate, ordering and performing treatments and interventions, ordering and review of laboratory studies, ordering and review of radiographic studies, pulse oximetry and re-evaluation of patient's condition.  MDM   Final diagnoses:  Sepsis, due to unspecified organism  Acute UTI  Acute diarrhea   Patient presents from senior living for near syncope, fever and diarrhea. He is febrile at 103.1 and  hypotensive. Sepsis order set was placed to a find source of infection. His lactic acid is 1.18. He has leukocytosis of 28, acute on chronic kidney failure, and a UTI. Filed Vitals:   10/10/14 0011  BP: 119/73  Pulse: 84  Temp: 98.6 F (37 C)  Resp: 18  Recheck: Patient is sleeping and easily arousable. He is hypotensive.   He was given 2 liters of fluids, tylenol, and ceftriaxone.  He is still hypotensive but otherwise stable and in no respiratory distress. Chest x-ray is negative for acute infectious process.  I discussed this patient with Dr. Hyacinth Meeker who has seen and evaluated patient himself. I spoke to the hospitalist who will admit to stepdown.    Catha Gosselin, PA-C 10/10/14 0056  Eber Hong, MD 10/11/14 2156

## 2014-10-09 NOTE — ED Notes (Signed)
Attempted IV, no success.

## 2014-10-09 NOTE — ED Notes (Signed)
Pt given urinal unable to urinate at this time

## 2014-10-09 NOTE — ED Notes (Signed)
Bed: ON62 Expected date:  Expected time:  Means of arrival:  Comments: EMS/63/syncope

## 2014-10-09 NOTE — H&P (Addendum)
[facility] History and Physical  David Lang WUJ:[id number] DOB: December 11, 1950 DOA: 10/09/2014  Referring physician:  PCP: Florentina Jenny, MD   Chief Complaint:  presents with  . Near Syncope  . Fever        HPI:   64 year old male with a history of hypertension diabetes, schizophrenia, recent admission for hematemesis secondary to Mallory-Weiss tear and  mid ulcerative esophagitis, status post EGD on 9/4, status post 2 units of packed red blood cells, discharged on 9/7, who presents to the ER with several episodes of diarrhea for the last 3 days. Patient does not provide any meaningful history and most history is obtained from the ED records and notes from nursing home. Patient is a resident of [facility].staff reported that he had a near syncopal episode earlier today. He normally ambulates with a walker and is assisted.  In the ER the patient was found to be hypotensive, tachycardic, febrile with a fever of 103.1. Chest x-ray was negative, white blood cell count of 27.6, creatinine has increased from 1.67>3.19. Lactic acid 1.18. UA grossly positive . Patient has received a total of 2 L of fluid boluses and remains hypotensive in the 90s.    Unable to perform ROS: Dementia  Constitutional: Positive for fever.  Respiratory: Negative for shortness of breath.  Cardiovascular: Positive for near-syncope. Negative for chest pain.    Past Medical History  Diagnosis Date  . Hypertension   . Diabetes mellitus without complication   . Mental disorder   . Schizophrenia   . Aspiration pneumonia 08/21/2013  . Hip fracture 03/30/2012  . Periprosthetic hip fracture--nondisplaced, Left 08/05/2013  . Subcapital fracture of neck of femur 03/31/2012  . Mallory-Weiss tear 10/03/2014  . Hepatic steatosis 10/03/2014  . Ulcerative esophagitis 10/03/2014  . Renal cyst, right      Past Surgical History  Procedure Laterality Date  . Hip arthroplasty Left 03/31/2012    Procedure: ARTHROPLASTY  BIPOLAR HIP;  Surgeon: Mable Paris, MD;  Location: [facility];  Service: Orthopedics;  Laterality: Left;  . Esophagogastroduodenoscopy (egd) with propofol N/A 09/30/2014    Procedure: ESOPHAGOGASTRODUODENOSCOPY (EGD) WITH PROPOFOL;  Surgeon: Vida Rigger, MD;  Location: [facility];  Service: Endoscopy;  Laterality: N/A;      Social History:  reports that he has never smoked. He has never used smokeless tobacco. He reports that he does not drink alcohol or use illicit drugs.    No Known Allergies  Family History  Problem Relation Age of Onset  . Diabetes type II Mother   . Hypertension Father         Prior to Admission medications   Medication Sig Start Date End Date Taking? Authorizing Provider  amLODipine (NORVASC) 10 MG tablet Take 1 tablet (10 mg total) by mouth daily. 04/18/12  Yes Adeline Joselyn Glassman, MD  atenolol (TENORMIN) 100 MG tablet Take 1 tablet (100 mg total) by mouth daily. 04/18/12  Yes Adeline Joselyn Glassman, MD  cetirizine (ZYRTEC) 10 MG tablet Take 10 mg by mouth daily as needed for allergies.    Yes Historical Provider, MD  clonazePAM (KLONOPIN) 0.5 MG tablet Take 0.5 tablets (0.25 mg total) by mouth as directed. Take 0.25 mg scheduled once every evening, and may also take 0.25mg  once in the morning as needed for anxiety. 10/03/14  Yes Christina P Rama, MD  cloNIDine (CATAPRES - DOSED IN MG/24 HR) 0.3 mg/24hr Place 1 patch (0.3 mg total) onto the skin once a week. 04/18/12  Yes Adeline C Viyuoh,  MD  haloperidol (HALDOL) 5 MG tablet Take 2.5-5 mg by mouth 2 (two) times daily. Take 2.5mg  in the morning and then take 5mg  daily at night   Yes Historical Provider, MD  haloperidol decanoate (HALDOL DECANOATE) 100 MG/ML injection Inject 100 mg into the muscle every 28 (twenty-eight) days.   Yes Historical Provider, MD  hydrALAZINE (APRESOLINE) 50 MG tablet Take 75 mg by mouth 3 (three) times daily.    Yes Historical Provider, MD  lisinopril (PRINIVIL,ZESTRIL) 20 MG tablet Take 20 mg  by mouth daily.   Yes Historical Provider, MD  loperamide (IMODIUM) 2 MG capsule Take 2 mg by mouth 3 (three) times daily as needed for diarrhea or loose stools.   Yes Historical Provider, MD  morphine (MS CONTIN) 15 MG 12 hr tablet Take 1 tablet (15 mg total) by mouth every 12 (twelve) hours as needed for pain. 10/03/14  Yes Christina P Rama, MD  OLANZapine (ZYPREXA) 5 MG tablet Take 5 mg by mouth 2 (two) times daily. And may take 1 additional dose of 5mg  as needed for pychosis   Yes Historical Provider, MD  ondansetron (ZOFRAN) 4 MG tablet Take 1 tablet (4 mg total) by mouth every 6 (six) hours as needed for nausea. 04/18/12  Yes Adeline Joselyn Glassman, MD  OYSTER SHELL CALCIUM PO Take 500 mg by mouth daily.    Yes Historical Provider, MD  pantoprazole (PROTONIX) 40 MG tablet Take 1 tablet (40 mg total) by mouth 2 (two) times daily. 10/03/14  Yes Maryruth Bun Rama, MD  spironolactone (ALDACTONE) 25 MG tablet Take 12.5 mg by mouth daily.   Yes Historical Provider, MD  tamsulosin (FLOMAX) 0.4 MG CAPS capsule Take 0.4 mg by mouth.   Yes Historical Provider, MD  trihexyphenidyl (ARTANE) 5 MG tablet Take 2.5 mg by mouth daily.    Yes Historical Provider, MD  vitamin B-12 1000 MCG tablet Take 1 tablet (1,000 mcg total) by mouth daily. 04/18/12  Yes Kela Millin, MD     Physical Exam: Filed Vitals:   10/09/14 2045 10/09/14 2100 10/09/14 2115 10/09/14 2231  BP: 103/47 99/48    Pulse: 82 84 84   Temp:    99.7 F (37.6 C)  TempSrc:    Oral  Resp: 24 29 26    SpO2: 87% 88% 90%      Constitutional: Vital signs reviewed. Patient is a well-developed and well-nourished in no acute distress and cooperative with exam. Alert and oriented x3.  Head: Normocephalic and atraumatic  Ear: TM normal bilaterally  Mouth: no erythema or exudates, MMM  Eyes: PERRL, EOMI, conjunctivae normal, No scleral icterus.  Neck: Supple, Trachea midline normal ROM, No JVD, mass, thyromegaly, or carotid bruit present.   Cardiovascular: RRR, S1 normal, S2 normal, no MRG, pulses symmetric and intact bilaterally  Lungs clear to auscultation bilaterally.  Abdominal: Soft. He exhibits no distension. There is no tenderness. There is no rebound and no guarding.  Abdomen is soft and nontender. No distention. No guarding or rebound.  GU: no CVA tenderness Musculoskeletal: No joint deformities, erythema, or stiffness, ROM full and no nontender Ext: no edema and no cyanosis, pulses palpable bilaterally (DP and PT)  Hematology: no cervical, inginal, or axillary adenopathy.  Neurological: A&O x3, Strenght is normal and symmetric bilaterally, cranial nerve II-XII are grossly intact, no focal motor deficit, sensory intact to light touch bilaterally.  Skin: Warm, dry and intact. No rash, cyanosis, or clubbing.  Psychiatric: Normal mood and affect. speech and behavior is  normal. Judgment and thought content normal. Cognition and memory are normal.      Data Review   Micro Results Recent Results (from the past 240 hour(s))  MRSA PCR Screening     Status: None   Collection Time: 09/30/14  1:00 AM  Result Value Ref Range Status   MRSA by PCR NEGATIVE NEGATIVE Final    Comment:        The GeneXpert MRSA Assay (FDA approved for NASAL specimens only), is one component of a comprehensive MRSA colonization surveillance program. It is not intended to diagnose MRSA infection nor to guide or monitor treatment for MRSA infections.   Blood Culture (routine x 2)     Status: None (Preliminary result)   Collection Time: 10/09/14  8:26 PM  Result Value Ref Range Status   Specimen Description BLOOD LEFT FOREARM  Final   Special Requests   Final    BOTTLES DRAWN AEROBIC AND ANAEROBIC 5CC BLUE 10CC RED Performed at [facility]    Culture PENDING  Incomplete   Report Status PENDING  Incomplete    Radiology Reports Dg Chest 2 View  09/29/2014   CLINICAL DATA:  Hematemesis.  EXAM: CHEST  2 VIEW  COMPARISON:   08/19/2013  FINDINGS: Cardiac enlargement unchanged. Negative for heart failure. Left lower lobe atelectasis.  Elevated right hemidiaphragm with mild right lower lobe atelectasis or scarring, unchanged from the prior study.  Negative for pneumonia or mass lesion.  IMPRESSION: Cardiac enlargement with mild left lower lobe atelectasis.  Elevated right hemidiaphragm with mild right lower lobe chronic atelectasis or scarring.   Electronically Signed   By: Marlan Palau M.D.   On: 09/29/2014 21:52   Dg Chest Port 1 View  10/09/2014   CLINICAL DATA:  64 year old male with fever  EXAM: PORTABLE CHEST - 1 VIEW  COMPARISON:  Chest radiograph dated 09/29/2014  FINDINGS: Single-view of the chest demonstrate minimal atelectatic changes of the left lung base. There is no focal consolidation, pleural effusion, or pneumothorax. Stable cardiac silhouette. The osseous structures are grossly unremarkable.  IMPRESSION: Left lung base subsegmental atelectasis.  No focal consolidation.   Electronically Signed   By: Elgie Collard M.D.   On: 10/09/2014 20:13   US Abdomen Limited Ruq  09/30/2014   CLINICAL DATA:  Elevated ammonia. History of hypertension and diabetes.  EXAM: US ABDOMEN LIMITED - RIGHT UPPER QUADRANT  COMPARISON:  None.  FINDINGS: Gallbladder:  No gallstones or wall thickening visualized. No sonographic Murphy sign noted.  Common bile duct:  Diameter: 3.7 mm  Liver:  Mildly echogenic.  Two right renal cysts are noted. The largest is arising from the upper pole, measuring 6.5 cm in maximum diameter. There is also a small right pleural effusion.  IMPRESSION: 1. Mildly echogenic liver, most likely due to steatosis. 2. Small right pleural effusion. 3. Right renal cysts.   Electronically Signed   By: Beckie Salts M.D.   On: 09/30/2014 12:37     CBC  Recent Labs Lab 10/03/14 0550 10/09/14 1850  WBC 8.4 27.6*  HGB 9.8* 8.5*  HCT 30.2* 25.3*  PLT 300 415*  MCV 89.9 87.8  MCH 29.2 29.5  MCHC 32.5 33.6  RDW  13.2 13.5  LYMPHSABS  --  2.2  MONOABS  --  2.2*  EOSABS  --  0.0  BASOSABS  --  0.0    Chemistries   Recent Labs Lab 10/03/14 0550 10/09/14 1850  NA 143 134*  K 4.1 4.2  CL  114* 104  CO2 23 19*  GLUCOSE 114* 175*  BUN 28* 41*  CREATININE 1.67* 3.19*  CALCIUM 8.6* 8.4*  AST  --  14*  ALT  --  8*  ALKPHOS  --  46  BILITOT  --  0.7   ------------------------------------------------------------------------------------------------------------------ estimated creatinine clearance is 27.2 mL/min (by C-G formula based on Cr of 3.19). ------------------------------------------------------------------------------------------------------------------ No results for input(s): HGBA1C in the last 72 hours. ------------------------------------------------------------------------------------------------------------------ No results for input(s): CHOL, HDL, LDLCALC, TRIG, CHOLHDL, LDLDIRECT in the last 72 hours. ------------------------------------------------------------------------------------------------------------------ No results for input(s): TSH, T4TOTAL, T3FREE, THYROIDAB in the last 72 hours.  Invalid input(s): FREET3 ------------------------------------------------------------------------------------------------------------------ No results for input(s): VITAMINB12, FOLATE, FERRITIN, TIBC, IRON, RETICCTPCT in the last 72 hours.  Coagulation profile No results for input(s): INR, PROTIME in the last 168 hours.  No results for input(s): DDIMER in the last 72 hours.  Cardiac Enzymes No results for input(s): CKMB, TROPONINI, MYOGLOBIN in the last 168 hours.  Invalid input(s): CK ------------------------------------------------------------------------------------------------------------------ Invalid input(s): POCBNP   CBG:  Recent Labs Lab 10/03/14 0754 10/03/14 1139  GLUCAP 110* 144*       EKG: Independently reviewed. *   Assessment/Plan Principal Problem:    Sepsis secondary to UTI- patient meets criteria for sepsis, blood culture 2 has been obtained, will empirically start the patient on Zosyn and vancomycin, most likely the source of infection is his urine. Will repeat chest x-ray in the morning as we cannot rule out aspiration, patient was found to be hypoxic in the ER. Rule out C. difficile      Hypertension-hold all antihypertensives medications,   patient on multiple medications at home including ACE inhibitor and Aldactone    Schizophrenia, paranoid type-continue Zyprexa, trihexyphenidyl    DM type 2 causing CKD stage 3-start the patient on a suicide    Chronic anemia-recent admission for hematemesis, hemoglobin stable, continue PPI, status post 2 units of packed red blood cells on 9/4    Acute renal failure superimposed on stage 3 chronic kidney disease-prerenal, likely a combination of sepsis and Ace inhibitors, continue aggressive IV fluids hopefully this will improve, will obtain CT abdomen pelvis without contrast to rule out hydronephrosis      UTI (lower urinary tract infection)-patient started on Zosyn   Code Status:   full Family Communication: bedside Disposition Plan: admit   Total time spent 55 minutes.Greater than 50% of this time was spent in counseling, explanation of diagnosis, planning of further management, and coordination of care  [facility] Pager [phone]  If 7PM-7AM, please contact night-coverage [email or web address] Password [facility] 10/09/2014, 11:55 PM

## 2014-10-09 NOTE — ED Notes (Signed)
MD at bedside. 

## 2014-10-09 NOTE — ED Provider Notes (Signed)
The patient is a 64 year old male, presents with fever, he does not complain of any specific problems but does state that he has had recent diarrhea though none today. On exam the patient has a normal heart rate, clear lung sounds, speaks in full sentences without respiratory distress and has a soft nontender abdomen. Skin without rashes, extremities without edema, mucous members appear normal. The patient does not seem to have a source of infection clinically on exam, will perform labs, chest x-ray, urinalysis and search of a source.  Medical screening examination/treatment/procedure(s) were conducted as a shared visit with non-physician practitioner(s) and myself.  I personally evaluated the patient during the encounter.  Clinical Impression:   Final diagnoses:  Sepsis, due to unspecified organism  Acute UTI  Acute diarrhea         Eber Hong, MD 10/11/14 2156

## 2014-10-10 ENCOUNTER — Inpatient Hospital Stay (HOSPITAL_COMMUNITY): Payer: BLUE CROSS/BLUE SHIELD

## 2014-10-10 ENCOUNTER — Encounter (HOSPITAL_COMMUNITY): Payer: Self-pay | Admitting: Radiology

## 2014-10-10 DIAGNOSIS — N183 Chronic kidney disease, stage 3 (moderate): Secondary | ICD-10-CM

## 2014-10-10 DIAGNOSIS — N179 Acute kidney failure, unspecified: Secondary | ICD-10-CM

## 2014-10-10 DIAGNOSIS — A419 Sepsis, unspecified organism: Principal | ICD-10-CM

## 2014-10-10 DIAGNOSIS — E1122 Type 2 diabetes mellitus with diabetic chronic kidney disease: Secondary | ICD-10-CM

## 2014-10-10 DIAGNOSIS — N390 Urinary tract infection, site not specified: Secondary | ICD-10-CM

## 2014-10-10 LAB — CBC
HEMATOCRIT: 25 % — AB (ref 39.0–52.0)
Hemoglobin: 8.4 g/dL — ABNORMAL LOW (ref 13.0–17.0)
MCH: 30.3 pg (ref 26.0–34.0)
MCHC: 33.6 g/dL (ref 30.0–36.0)
MCV: 90.3 fL (ref 78.0–100.0)
PLATELETS: 385 10*3/uL (ref 150–400)
RBC: 2.77 MIL/uL — AB (ref 4.22–5.81)
RDW: 13.6 % (ref 11.5–15.5)
WBC: 24.7 10*3/uL — AB (ref 4.0–10.5)

## 2014-10-10 LAB — TROPONIN I
TROPONIN I: 0.05 ng/mL — AB (ref <0.031)
Troponin I: <0.03 ng/mL (ref <0.031)

## 2014-10-10 LAB — CREATININE, SERUM
Creatinine, Ser: 3.17 mg/dL — ABNORMAL HIGH (ref 0.61–1.24)
GFR calc non Af Amer: 19 mL/min — ABNORMAL LOW (ref >60)
GFR, EST AFRICAN AMERICAN: 22 mL/min — AB (ref >60)

## 2014-10-10 LAB — MAGNESIUM: MAGNESIUM: 1.6 mg/dL — AB (ref 1.7–2.4)

## 2014-10-10 MED ORDER — PANTOPRAZOLE SODIUM 40 MG IV SOLR
40.0000 mg | INTRAVENOUS | Status: DC
  Administered 2014-10-10: 40 mg via INTRAVENOUS
  Filled 2014-10-10: qty 40

## 2014-10-10 MED ORDER — PANTOPRAZOLE SODIUM 40 MG PO TBEC: 40.0000 mg | DELAYED_RELEASE_TABLET | Freq: Two times a day (BID) | ORAL | Status: DC

## 2014-10-10 MED ORDER — TAMSULOSIN HCL 0.4 MG PO CAPS
0.4000 mg | ORAL_CAPSULE | Freq: Every day | ORAL | Status: DC
  Administered 2014-10-10 – 2014-10-15 (×6): 0.4 mg via ORAL
  Filled 2014-10-10 (×7): qty 1

## 2014-10-10 MED ORDER — TRIHEXYPHENIDYL HCL 5 MG PO TABS
2.5000 mg | ORAL_TABLET | Freq: Every day | ORAL | Status: DC
  Administered 2014-10-10 – 2014-10-15 (×6): 2.5 mg via ORAL
  Filled 2014-10-10 (×6): qty 1

## 2014-10-10 MED ORDER — MORPHINE SULFATE (PF) 2 MG/ML IV SOLN: 1.0000 mg | INTRAVENOUS | Status: DC | PRN

## 2014-10-10 MED ORDER — OLANZAPINE 5 MG PO TABS
5.0000 mg | ORAL_TABLET | Freq: Two times a day (BID) | ORAL | Status: DC
  Administered 2014-10-10 – 2014-10-15 (×11): 5 mg via ORAL
  Filled 2014-10-10 (×13): qty 1

## 2014-10-10 MED ORDER — VITAMIN B-12 1000 MCG PO TABS
1000.0000 ug | ORAL_TABLET | Freq: Every day | ORAL | Status: DC
  Administered 2014-10-10 – 2014-10-15 (×6): 1000 ug via ORAL
  Filled 2014-10-10 (×6): qty 1

## 2014-10-10 MED ORDER — VANCOMYCIN HCL IN DEXTROSE 1-5 GM/200ML-% IV SOLN
1000.0000 mg | Freq: Every day | INTRAVENOUS | Status: DC
  Administered 2014-10-10: 1000 mg via INTRAVENOUS
  Filled 2014-10-10: qty 200

## 2014-10-10 MED ORDER — ONDANSETRON HCL 4 MG PO TABS: 4.0000 mg | ORAL_TABLET | Freq: Four times a day (QID) | ORAL | Status: DC | PRN

## 2014-10-10 MED ORDER — PIPERACILLIN-TAZOBACTAM 3.375 G IVPB
3.3750 g | Freq: Three times a day (TID) | INTRAVENOUS | Status: DC
  Administered 2014-10-10 – 2014-10-15 (×16): 3.375 g via INTRAVENOUS
  Filled 2014-10-10 (×17): qty 50

## 2014-10-10 NOTE — Progress Notes (Signed)
ANTIBIOTIC CONSULT NOTE - INITIAL  Pharmacy Consult for Vancomycin and Zosyn  Indication: sepsis, UTI  No Known Allergies  Patient Measurements: Weight: 180 lb 1.9 oz (81.7 kg) Adjusted Body Weight:   Vital Signs: Temp: 97.9 F (36.6 C) (09/14 0539) Temp Source: Oral (09/14 0539) BP: 114/67 mmHg (09/14 0600) Pulse Rate: 80 (09/14 0600) Intake/Output from previous day:   Intake/Output from this shift:    Labs:  Recent Labs  10/09/14 1850 10/10/14 0132  WBC 27.6* 24.7*  HGB 8.5* 8.4*  PLT 415* 385  CREATININE 3.19* 3.17*   Estimated Creatinine Clearance: 27.2 mL/min (by C-G formula based on Cr of 3.17). No results for input(s): VANCOTROUGH, VANCOPEAK, VANCORANDOM, GENTTROUGH, GENTPEAK, GENTRANDOM, TOBRATROUGH, TOBRAPEAK, TOBRARND, AMIKACINPEAK, AMIKACINTROU, AMIKACIN in the last 72 hours.   Microbiology: Recent Results (from the past 720 hour(s))  MRSA PCR Screening     Status: None   Collection Time: 09/30/14  1:00 AM  Result Value Ref Range Status   MRSA by PCR NEGATIVE NEGATIVE Final    Comment:        The GeneXpert MRSA Assay (FDA approved for NASAL specimens only), is one component of a comprehensive MRSA colonization surveillance program. It is not intended to diagnose MRSA infection nor to guide or monitor treatment for MRSA infections.   Blood Culture (routine x 2)     Status: None (Preliminary result)   Collection Time: 10/09/14  8:26 PM  Result Value Ref Range Status   Specimen Description BLOOD LEFT FOREARM  Final   Special Requests   Final    BOTTLES DRAWN AEROBIC AND ANAEROBIC 5CC BLUE 10CC RED Performed at [facility]    Culture PENDING  Incomplete   Report Status PENDING  Incomplete    Medical History: Past Medical History  Diagnosis Date  . Hypertension   . Diabetes mellitus without complication   . Mental disorder   . Schizophrenia   . Aspiration pneumonia 08/21/2013  . Hip fracture 03/30/2012  . Periprosthetic hip  fracture--nondisplaced, Left 08/05/2013  . Subcapital fracture of neck of femur 03/31/2012  . Mallory-Weiss tear 10/03/2014  . Hepatic steatosis 10/03/2014  . Ulcerative esophagitis 10/03/2014  . Renal cyst, right     Medications:  Anti-infectives    Start     Dose/Rate Route Frequency Ordered Stop   10/10/14 2200  vancomycin (VANCOCIN) IVPB 1000 mg/200 mL premix     1,000 mg 200 mL/hr over 60 Minutes Intravenous Daily at bedtime 10/10/14 0638     10/10/14 0800  piperacillin-tazobactam (ZOSYN) IVPB 3.375 g     3.375 g 12.5 mL/hr over 240 Minutes Intravenous 3 times per day 10/10/14 0638     10/10/14 0000  vancomycin (VANCOCIN) IVPB 1000 mg/200 mL premix     1,000 mg 200 mL/hr over 60 Minutes Intravenous  Once 10/09/14 2357 10/10/14 0234   10/10/14 0000  piperacillin-tazobactam (ZOSYN) IVPB 3.375 g     3.375 g 100 mL/hr over 30 Minutes Intravenous  Once 10/09/14 2357 10/10/14 0134   10/09/14 2300  cefTRIAXone (ROCEPHIN) 1 g in dextrose 5 % 50 mL IVPB     1 g 100 mL/hr over 30 Minutes Intravenous  Once 10/09/14 2247 10/09/14 2344     Assessment: Patient with sepsis secondary to UTI.  First dose of antibiotics already given.  Patient with very poor renal function.  Goal of Therapy:  Vancomycin trough level 15-20 mcg/ml  Zosyn based on renal function   Plan:  Measure antibiotic drug levels at  steady state Follow up culture results Vancomycin 1gm iv q24hr  Zosyn 3.375g IV Q8H infused over 4hrs.   Darlina Guys, Jacquenette Shone Crowford 10/10/2014,6:41 AM

## 2014-10-10 NOTE — Care Management Note (Signed)
Case Management Note  Patient Details  Name: David Lang MRN: [id number] Date of Birth: 1950-02-26  Subjective/Objective:              Sepsis with hypotension      Action/Plan: Date:  Sept. 14, 2016 U.R. performed for needs and level of care. Will continue to follow for Case Management needs.  Marcelle Smiling, RN, BSN, [place]   [phone]  Expected Discharge Date:   (UNKNOWN)               Expected Discharge Plan:  Skilled Nursing Facility  In-House Referral:  Clinical Social Work  Discharge planning Services  CM Consult  Post Acute Care Choice:  NA Choice offered to:  NA  DME Arranged:    DME Agency:     HH Arranged:    HH Agency:     Status of Service:  In process, will continue to follow  Medicare Important Message Given:    Date Medicare IM Given:    Medicare IM give by:    Date Additional Medicare IM Given:    Additional Medicare Important Message give by:     If discussed at Long Length of Stay Meetings, dates discussed:    Additional Comments:  Golda Acre, RN 10/10/2014, 9:59 AM

## 2014-10-10 NOTE — Progress Notes (Signed)
Initial Nutrition Assessment  DOCUMENTATION CODES:   Not applicable  INTERVENTION:  - RD will continue to monitor for needs with diet advancement  NUTRITION DIAGNOSIS:   Inadequate oral intake related to inability to eat as evidenced by NPO status.  GOAL:   Patient will meet greater than or equal to 90% of their needs  MONITOR:   Diet advancement, Weight trends, Labs, I & O's  REASON FOR ASSESSMENT:   Malnutrition Screening Tool  ASSESSMENT:   64 year old male with a history of hypertension diabetes, schizophrenia, recent admission for hematemesis secondary to Mallory-Weiss tear and mid ulcerative esophagitis, status post EGD on 9/4, status post 2 units of packed red blood cells, discharged on 9/7, who presents to the ER with several episodes of diarrhea for the last 3 days. Patient does not provide any meaningful history and most history is obtained from the ED records and notes from nursing home. Patient is a resident of [facility].staff reported that he had a near syncopal episode earlier today. He normally ambulates with a walker and is assisted.   Pt seen for MST. BMI indicates normal weight status. Pt has been NPO since admission.   He denies abdominal pain or nausea at this time but states he was experiencing these sensations PTA. He states that his appetite has decreased but unable to give time frame for this occurrence.  He states that he has lost 25-35 lbs and when asked about time frame for weight loss, pt reports "since my brother and sister put that spell on me, it was witchcraft." Pt does not give further details about question asked. Noted hx of schizophrenia. No recent weight hx in chart prior to this admission.  Did not complete physical assessment given pt's current demeanor. Unable to determine if he meets criteria for malnutrition at this time.  Unable to meet needs while NPO. Medications reviewed. Labs reviewed; CBGs: 109-144 mg/dL, Na: 409  mmol/L, BUN/creatinine elevated and trending up, Ca: 8.4 mg/dL, GFR: 19.   Diet Order:  Diet NPO time specified  Skin:  Reviewed, no issues  Last BM:  9/14  Height:   Ht Readings from Last 1 Encounters:  09/30/14  (1.88 m)    Weight:   Wt Readings from Last 1 Encounters:  10/10/14 180 lb 1.9 oz (81.7 kg)    Ideal Body Weight:   86.36 kg  BMI:  Body mass index is 23.12 kg/(m^2).  Estimated Nutritional Needs:   Kcal:  1600-1800  Protein:  65-75 grams  Fluid:  1.8-2.2 L/day  EDUCATION NEEDS:   No education needs identified at this time     Trenton Gammon, RD, LDN Inpatient Clinical Dietitian Pager # [phone] After hours/weekend pager # [phone]

## 2014-10-10 NOTE — ED Notes (Signed)
Pt had a bowel movement that was yellow in color , sample retrieved for lab,  Pt cleaned and warm blankets applied

## 2014-10-10 NOTE — Progress Notes (Signed)
[facility] PROGRESS NOTE  David Lang ZOX:[id number] DOB: 1950-03-17 DOA: 10/09/2014 PCP: Florentina Jenny, MD  Assessment/Plan: 1. Sepsis -Present on admission, evidenced by a white count of 27,600, development of acute renal failure, temperature 103 with source of infection likely to be urinary tract infection. Urine appeared turbid with UA showing the presence of many bacteria, and large amount of leukocytes. -Patient recently hospitalized, fully catheter may have been utilized on recent hospitalization -Urine blood cultures are pending at time of this dictation. -Hypotension responding to IV fluid resuscitation -Continue supportive care in the step down unit, broad-spectrum IV antimicrobial therapy with vancomycin and Zosyn, consider narrowing antimicrobials in a.m. -Follow-up on cultures.  2.  Acute on chronic renal failure. -Labs showing creatinine of 3.19, increased from 1.67 on 10/03/2014. Likely secondary to critical illness/sepsis -Continue IV fluid resuscitation with normal saline -On physical exam appears dry -Follow volume status closely  3.  Acute encephalopathy/delirium. -Patient presenting with delirium likely secondary to infectious process. He was noted to have an acute change with inattention and mental status changes. -Will provide IV fluid resuscitation and empiric IV antimicrobial therapy  4.  History of GI bleed. -He was recently hospitalized undergoing upper endoscopy on 09/30/2014 that revealed a Mallory-Weiss tear and severe distal and mid ulcerated esophagitis. -Continue Protonix 40 mg IV every 24 hours  5.  History of type 2 diabetes mellitus -He is currently nothing by mouth, continue sliding scale coverage  6.  History of hypertension -Anti-hypertensive agents held due to the presentation of hypotension and sepsis  Code Status: Full code Family Communication: Spoke with his sister over telephone Disposition Plan: Monitor closely the step down  unit   Consultants:    Procedures:    Antibiotics:  Zosyn  Vancomycin  HPI/Subjective: Patient is a 64 year old gentleman with multiple comorbidities including diabetes mellitus, hypertension, recent hospitalization for upper GI bleed status post upper endoscopy on 09/30/2014, discharged from the medicine service on 10/03/2014 presented to the emergency department with mental status changes. He presented as a transfer from his facility. On admission his found to be septic having a white count of 27,600, acute renal failure, temperature of 103, with source of infection likely to be urinary tract. He was started on broad-spectrum IV antimicrobial therapy with vancomycin and Zosyn.  Objective: Filed Vitals:   10/10/14 0600  BP: 114/67  Pulse: 80  Temp:   Resp: 14   No intake or output data in the 24 hours ending 10/10/14 0801 Filed Weights   10/10/14 0539  Weight: 81.7 kg (180 lb 1.9 oz)    Exam:   General:  Toxic-appearing, confused, delirious, cannot provide history.  Cardiovascular: Tachycardic, regular rate rhythm normal S1-S2  Respiratory: Normal respiratory effort lungs clear to auscultation  Abdomen: Soft nontender nondistended  Musculoskeletal: No edema  Data Reviewed: Basic Metabolic Panel:  Recent Labs Lab 10/09/14 1850 10/10/14 0132  NA 134*  --   K 4.2  --   CL 104  --   CO2 19*  --   GLUCOSE 175*  --   BUN 41*  --   CREATININE 3.19* 3.17*  CALCIUM 8.4*  --   MG  --  1.6*   Liver Function Tests:  Recent Labs Lab 10/09/14 1850  AST 14*  ALT 8*  ALKPHOS 46  BILITOT 0.7  PROT 6.5  ALBUMIN 3.1*   No results for input(s): LIPASE, AMYLASE in the last 168 hours.  Recent Labs Lab 10/09/14 1850  AMMONIA 40*   CBC:  Recent Labs Lab 10/09/14 1850 10/10/14 0132  WBC 27.6* 24.7*  NEUTROABS 23.2*  --   HGB 8.5* 8.4*  HCT 25.3* 25.0*  MCV 87.8 90.3  PLT 415* 385   Cardiac Enzymes:  Recent Labs Lab 10/10/14 0132  TROPONINI  <0.03   BNP (last 3 results) No results for input(s): BNP in the last 8760 hours.  ProBNP (last 3 results) No results for input(s): PROBNP in the last 8760 hours.  CBG:  Recent Labs Lab 10/03/14 1139  GLUCAP 144*    Recent Results (from the past 240 hour(s))  Blood Culture (routine x 2)     Status: None (Preliminary result)   Collection Time: 10/09/14  8:26 PM  Result Value Ref Range Status   Specimen Description BLOOD LEFT FOREARM  Final   Special Requests   Final    BOTTLES DRAWN AEROBIC AND ANAEROBIC 5CC BLUE 10CC RED Performed at [facility]    Culture PENDING  Incomplete   Report Status PENDING  Incomplete     Studies: Ct Abdomen Pelvis Wo Contrast  10/10/2014   CLINICAL DATA:  64 year old male with UTI.  EXAM: CT ABDOMEN AND PELVIS WITHOUT CONTRAST  TECHNIQUE: Multidetector CT imaging of the abdomen and pelvis was performed following the standard protocol without IV contrast.  COMPARISON:  Ultrasound dated 09/30/2014 and CT of the pelvis dated 08/05/2013  FINDINGS: Evaluation of this exam is limited in the absence of intravenous contrast. Evaluation is also limited due to respiratory motion artifact.  Partially visualized small bilateral pleural effusions. The visualized lung bases are otherwise clear. There is coronary vascular calcification. A small pericardial effusion measuring up to 9 mm in transverse dimensions noted. There is hypoattenuation of the cardiac blood pool compatible with anemia.  No intra-abdominal free air or free fluid.  Stop  The liver, gallbladder, pancreas, spleen, and adrenal glands appear unremarkable. There is no hydronephrosis or nephrolithiasis. Multiple bilateral renal hypodense lesions measuring up to 7 cm in the upper pole of the right kidney. The largest lesion represents a cyst. Smaller lesions are not well characterized. Ultrasound may provide better evaluation. There is diffuse thickening of the urinary bladder most compatible with  cystitis. Correlation with urinalysis recommended. A small pocket of air in the anterior bladder may be related to recent instrumentation. Clinical correlation is recommended. The prostate gland is mildly enlarged.  Loose stool noted throughout the colon compatible with diarrheal state. Correlation with clinical exam and stool cultures recommended. There is diffuse thickening of the wall of the stomach which may be related to underdistention. Gastritis is not excluded. Clinical correlation is recommended. No evidence of bowel obstruction. Normal appendix.  There is aortoiliac atherosclerotic disease. No portal venous gas identified. There is no adenopathy. Osteopenia with degenerative changes of the spine. Old healed right eleventh rib fracture. No acute fracture. Total left hip arthroplasty.  IMPRESSION: Diffuse bladder wall thickening most compatible with cystitis. Correlation with urinalysis recommended.  There is no hydronephrosis or nephrolithiasis. Probable bilateral renal cysts. Ultrasound may provide better evaluation.  Diarrheal state. Correlation with clinical exam and stool cultures recommended. No bowel obstruction. Normal appendix.   Electronically Signed   By: Elgie Collard M.D.   On: 10/10/2014 01:43   Dg Chest Port 1 View  10/10/2014   CLINICAL DATA:  Shortness of breath.  EXAM: PORTABLE CHEST - 1 VIEW  COMPARISON:  Radiographs obtained yesterday at 1948 hour.  FINDINGS: Lung volumes are low. The cardiomediastinal contours are unchanged allowing for differences in  technique. Decreased atelectasis at the left lung base, with minimal increase in right basilar atelectasis. No pulmonary edema. No consolidation, pleural effusion, or pneumothorax. No acute osseous abnormalities are seen.  IMPRESSION: Shifting atelectasis at the lung bases, improved on the left but worsening on the right.   Electronically Signed   By: Rubye Oaks M.D.   On: 10/10/2014 05:21   Dg Chest Port 1 View  10/09/2014    CLINICAL DATA:  64 year old male with fever  EXAM: PORTABLE CHEST - 1 VIEW  COMPARISON:  Chest radiograph dated 09/29/2014  FINDINGS: Single-view of the chest demonstrate minimal atelectatic changes of the left lung base. There is no focal consolidation, pleural effusion, or pneumothorax. Stable cardiac silhouette. The osseous structures are grossly unremarkable.  IMPRESSION: Left lung base subsegmental atelectasis.  No focal consolidation.   Electronically Signed   By: Elgie Collard M.D.   On: 10/09/2014 20:13    Scheduled Meds: . heparin  5,000 Units Subcutaneous 3 times per day  . OLANZapine  5 mg Oral BID  . pantoprazole  40 mg Oral BID  . piperacillin-tazobactam (ZOSYN)  IV  3.375 g Intravenous 3 times per day  . sodium chloride  3 mL Intravenous Q12H  . tamsulosin  0.4 mg Oral QPC breakfast  . trihexyphenidyl  2.5 mg Oral Daily  . vancomycin  1,000 mg Intravenous QHS  . vitamin B-12  1,000 mcg Oral Daily   Continuous Infusions: . sodium chloride 125 mL/hr at 10/10/14 0608    Principal Problem:   Sepsis secondary to UTI Active Problems:   Hypertension   Schizophrenia, paranoid type   DM type 2 causing CKD stage 3   Chronic anemia   Acute renal failure superimposed on stage 3 chronic kidney disease   Lactic acidosis   UTI (lower urinary tract infection)    Time spent: 35 min    Jeralyn Bennett  [facility] Pager [phone]. If 7PM-7AM, please contact night-coverage at [email or web address], password [facility] 10/10/2014, 8:01 AM  LOS: 1 day

## 2014-10-10 NOTE — ED Notes (Signed)
David Lang to call back for transport

## 2014-10-11 DIAGNOSIS — R410 Disorientation, unspecified: Secondary | ICD-10-CM

## 2014-10-11 LAB — BASIC METABOLIC PANEL
Anion gap: 10 (ref 5–15)
BUN: 29 mg/dL — ABNORMAL HIGH (ref 6–20)
CALCIUM: 7.8 mg/dL — AB (ref 8.9–10.3)
CO2: 17 mmol/L — AB (ref 22–32)
CREATININE: 2.44 mg/dL — AB (ref 0.61–1.24)
Chloride: 109 mmol/L (ref 101–111)
GFR calc Af Amer: 31 mL/min — ABNORMAL LOW (ref >60)
GFR calc non Af Amer: 26 mL/min — ABNORMAL LOW (ref >60)
GLUCOSE: 88 mg/dL (ref 65–99)
Potassium: 4.2 mmol/L (ref 3.5–5.1)
Sodium: 136 mmol/L (ref 135–145)

## 2014-10-11 LAB — CBC
HCT: 27.8 % — ABNORMAL LOW (ref 39.0–52.0)
Hemoglobin: 9 g/dL — ABNORMAL LOW (ref 13.0–17.0)
MCH: 28.8 pg (ref 26.0–34.0)
MCHC: 32.4 g/dL (ref 30.0–36.0)
MCV: 88.8 fL (ref 78.0–100.0)
PLATELETS: 376 10*3/uL (ref 150–400)
RBC: 3.13 MIL/uL — ABNORMAL LOW (ref 4.22–5.81)
RDW: 13.9 % (ref 11.5–15.5)
WBC: 14.9 10*3/uL — ABNORMAL HIGH (ref 4.0–10.5)

## 2014-10-11 MED ORDER — SODIUM CHLORIDE 0.9 % IV SOLN
INTRAVENOUS | Status: DC
  Administered 2014-10-12: 10:00:00 via INTRAVENOUS

## 2014-10-11 MED ORDER — PANTOPRAZOLE SODIUM 40 MG PO TBEC
40.0000 mg | DELAYED_RELEASE_TABLET | Freq: Two times a day (BID) | ORAL | Status: DC
  Administered 2014-10-11 – 2014-10-15 (×9): 40 mg via ORAL
  Filled 2014-10-11 (×10): qty 1

## 2014-10-11 NOTE — Evaluation (Signed)
Clinical/Bedside Swallow Evaluation Patient Details  Name: David Lang MRN: [id number] Date of Birth: 1950-05-15  Today's Date: 10/11/2014 Time: SLP Start Time (ACUTE ONLY): 0900 SLP Stop Time (ACUTE ONLY): 0914 SLP Time Calculation (min) (ACUTE ONLY): 14 min  Past Medical History:  Past Medical History  Diagnosis Date  . Hypertension   . Diabetes mellitus without complication   . Mental disorder   . Schizophrenia   . Aspiration pneumonia 08/21/2013  . Hip fracture 03/30/2012  . Periprosthetic hip fracture--nondisplaced, Left 08/05/2013  . Subcapital fracture of neck of femur 03/31/2012  . Mallory-Weiss tear 10/03/2014  . Hepatic steatosis 10/03/2014  . Ulcerative esophagitis 10/03/2014  . Renal cyst, right    Past Surgical History:  Past Surgical History  Procedure Laterality Date  . Hip arthroplasty Left 03/31/2012    Procedure: ARTHROPLASTY BIPOLAR HIP;  Surgeon: Mable Paris, MD;  Location: [facility];  Service: Orthopedics;  Laterality: Left;  . Esophagogastroduodenoscopy (egd) with propofol N/A 09/30/2014    Procedure: ESOPHAGOGASTRODUODENOSCOPY (EGD) WITH PROPOFOL;  Surgeon: Vida Rigger, MD;  Location: [facility];  Service: Endoscopy;  Laterality: N/A;  . Joint replacement     HPI:  64 year old male with a history of hypertension, diabetes, schizophrenia, recent admission for hematemesis secondary to Mallory-Weiss tear andulcerative esophagitis, who presents after a near syncopal episode. Pt was septic upon admission, and per MD note, source is likely UTI.   Assessment / Plan / Recommendation Clinical Impression  Pt's oropharyngeal swallow appears to be grossly WFL, without overt signs of aspiration despite impulsivity with intake. No clear indications of esophageal component are observed, however note pt's h/o esophageal issues and therefore recommend to continue regular diet and thin liquids with use of general aspiration and esophageal precautions. No further SLP needs  identified at this time.    Aspiration Risk  Mild    Diet Recommendation Age appropriate regular solids;Thin   Medication Administration: Whole meds with liquid Compensations: Minimize environmental distractions;Slow rate;Small sips/bites    Other  Recommendations Oral Care Recommendations: Oral care BID   Pertinent Vitals/Pain n/a    SLP Swallow Goals     Swallow Study Prior Functional Status       General Other Pertinent Information: 64 year old male with a history of hypertension, diabetes, schizophrenia, recent admission for hematemesis secondary to Mallory-Weiss tear andulcerative esophagitis, who presents after a near syncopal episode. Pt was septic upon admission, and per MD note, source is likely UTI. Type of Study: Bedside swallow evaluation Previous Swallow Assessment: BSE 07/2013 initially recommending NPO due to altered mentation,  Diet Prior to this Study: Regular;Thin liquids Temperature Spikes Noted: Yes (102.8) Respiratory Status: Room air History of Recent Intubation: No Behavior/Cognition: Alert;Cooperative;Requires cueing Oral Cavity - Dentition: Adequate natural dentition/normal for age Self-Feeding Abilities: Able to feed self (requests assistance) Patient Positioning: Upright in chair/Tumbleform Baseline Vocal Quality: Normal;Other (comment) (monotone)    Oral/Motor/Sensory Function Overall Oral Motor/Sensory Function: Appears within functional limits for tasks assessed   Ice Chips Ice chips: Not tested   Thin Liquid Thin Liquid: Within functional limits Presentation: Cup;Self Fed;Straw    Nectar Thick Nectar Thick Liquid: Not tested   Honey Thick Honey Thick Liquid: Not tested   Puree Puree: Within functional limits Presentation: Self Fed;Spoon   Solid    Solid: Within functional limits Presentation: Self Fed      [name], M.A. CCC-SLP [phone]  [name] 10/11/2014,9:38 AM

## 2014-10-11 NOTE — Progress Notes (Signed)
[facility] PROGRESS NOTE  David Lang ZOX:[id number] DOB: Jan 11, 1951 DOA: 10/09/2014 PCP: Florentina Jenny, MD  Assessment/Plan: 1. Sepsis -Present on admission, evidenced by a white count of 27,600, development of acute renal failure, temperature 103 with source of infection likely to be urinary tract infection. Urine appeared turbid with UA showing the presence of many bacteria, and large amount of leukocytes. -Patient recently hospitalized, foley catheter may have been utilized on recent hospitalization -Urine blood cultures are pending -On a patient showing significant clinical improvement this morning, he was assisted out of bed to chair. As mentioned above source of infection is likely urinary tract infection for which I'll discontinue his vancomycin and keep him on Zosyn for the next 24 hours. Await urine and blood cultures.  2.  Acute on chronic renal failure. -Creatinine trending down from 3.17-2.44 after receiving IV fluid resuscitation. His renal failure was likely secondary to ATN in setting of hypotension and probably prerenal azothemia/hypovolemia from decreased by mouth intake. -We'll encourage oral intake today, continue normal saline at 75 mL/hour  3.  Acute encephalopathy/delirium. -Patient presenting with delirium likely secondary to infectious process. He was noted to have an acute change with inattention and mental status changes. -I think he is showing clinical improvement today, he was assisted out of bed to chair. He appears stronger and less confused. Will consult physical therapy, transfer him to MedSurg bed.  4.  History of GI bleed. -He was recently hospitalized undergoing upper endoscopy on 09/30/2014 that revealed a Mallory-Weiss tear and severe distal and mid ulcerated esophagitis. -Will transition to oral Protonix today.  5.  History of type 2 diabetes mellitus -He is currently nothing by mouth, continue sliding scale coverage  6.  History of  hypertension -Anti-hypertensive agents held due to the presentation of hypotension and sepsis  Code Status: Full code Family Communication: Spoke with his sister over telephone Disposition Plan: Transfer to MedSurg floor   Consultants:    Procedures:    Antibiotics:  Zosyn  Vancomycin stopped on 10/11/2014  HPI/Subjective: Patient is a 64 year old gentleman with multiple comorbidities including diabetes mellitus, hypertension, recent hospitalization for upper GI bleed status post upper endoscopy on 09/30/2014, discharged from the medicine service on 10/03/2014 presented to the emergency department with mental status changes. He presented as a transfer from his facility. On admission his found to be septic having a white count of 27,600, acute renal failure, temperature of 103, with source of infection likely to be urinary tract. He was started on broad-spectrum IV antimicrobial therapy with vancomycin and Zosyn.  Objective: Filed Vitals:   10/11/14 0700  BP:   Pulse: 91  Temp:   Resp: 37    Intake/Output Summary (Last 24 hours) at 10/11/14 0844 Last data filed at 10/11/14 0700  Gross per 24 hour  Intake   3655 ml  Output    800 ml  Net   2855 ml   Filed Weights   10/10/14 0539  Weight: 81.7 kg (180 lb 1.9 oz)    Exam:   General:  Appears better, he is more awake and alert, following commands, was assisted out of bed to chair. Less toxic appearing.  Cardiovascular: regular rate rhythm normal S1-S2  Respiratory: Normal respiratory effort lungs clear to auscultation  Abdomen: Soft nontender nondistended  Musculoskeletal: No edema  Data Reviewed: Basic Metabolic Panel:  Recent Labs Lab 10/09/14 1850 10/10/14 0132 10/11/14 0340  NA 134*  --  136  K 4.2  --  4.2  CL 104  --  109  CO2 19*  --  17*  GLUCOSE 175*  --  88  BUN 41*  --  29*  CREATININE 3.19* 3.17* 2.44*  CALCIUM 8.4*  --  7.8*  MG  --  1.6*  --    Liver Function Tests:  Recent  Labs Lab 10/09/14 1850  AST 14*  ALT 8*  ALKPHOS 46  BILITOT 0.7  PROT 6.5  ALBUMIN 3.1*   No results for input(s): LIPASE, AMYLASE in the last 168 hours.  Recent Labs Lab 10/09/14 1850  AMMONIA 40*   CBC:  Recent Labs Lab 10/09/14 1850 10/10/14 0132 10/11/14 0340  WBC 27.6* 24.7* 14.9*  NEUTROABS 23.2*  --   --   HGB 8.5* 8.4* 9.0*  HCT 25.3* 25.0* 27.8*  MCV 87.8 90.3 88.8  PLT 415* 385 376   Cardiac Enzymes:  Recent Labs Lab 10/10/14 0132 10/10/14 0745 10/10/14 1335  TROPONINI <0.03 <0.03 0.05*   BNP (last 3 results) No results for input(s): BNP in the last 8760 hours.  ProBNP (last 3 results) No results for input(s): PROBNP in the last 8760 hours.  CBG: No results for input(s): GLUCAP in the last 168 hours.  Recent Results (from the past 240 hour(s))  Blood Culture (routine x 2)     Status: None (Preliminary result)   Collection Time: 10/09/14  8:26 PM  Result Value Ref Range Status   Specimen Description BLOOD LEFT HAND  Final   Special Requests BOTTLES DRAWN AEROBIC AND ANAEROBIC 10CC  Final   Culture   Final    NO GROWTH < 24 HOURS Performed at [facility]    Report Status PENDING  Incomplete  Blood Culture (routine x 2)     Status: None (Preliminary result)   Collection Time: 10/09/14  8:26 PM  Result Value Ref Range Status   Specimen Description BLOOD LEFT FOREARM  Final   Special Requests   Final    BOTTLES DRAWN AEROBIC AND ANAEROBIC 5CC BLUE 10CC RED   Culture   Final    NO GROWTH < 24 HOURS Performed at [facility]    Report Status PENDING  Incomplete     Studies: Ct Abdomen Pelvis Wo Contrast  10/10/2014   CLINICAL DATA:  64 year old male with UTI.  EXAM: CT ABDOMEN AND PELVIS WITHOUT CONTRAST  TECHNIQUE: Multidetector CT imaging of the abdomen and pelvis was performed following the standard protocol without IV contrast.  COMPARISON:  Ultrasound dated 09/30/2014 and CT of the pelvis dated 08/05/2013  FINDINGS:  Evaluation of this exam is limited in the absence of intravenous contrast. Evaluation is also limited due to respiratory motion artifact.  Partially visualized small bilateral pleural effusions. The visualized lung bases are otherwise clear. There is coronary vascular calcification. A small pericardial effusion measuring up to 9 mm in transverse dimensions noted. There is hypoattenuation of the cardiac blood pool compatible with anemia.  No intra-abdominal free air or free fluid.  Stop  The liver, gallbladder, pancreas, spleen, and adrenal glands appear unremarkable. There is no hydronephrosis or nephrolithiasis. Multiple bilateral renal hypodense lesions measuring up to 7 cm in the upper pole of the right kidney. The largest lesion represents a cyst. Smaller lesions are not well characterized. Ultrasound may provide better evaluation. There is diffuse thickening of the urinary bladder most compatible with cystitis. Correlation with urinalysis recommended. A small pocket of air in the anterior bladder may be related to recent instrumentation. Clinical correlation is recommended. The prostate gland is  mildly enlarged.  Loose stool noted throughout the colon compatible with diarrheal state. Correlation with clinical exam and stool cultures recommended. There is diffuse thickening of the wall of the stomach which may be related to underdistention. Gastritis is not excluded. Clinical correlation is recommended. No evidence of bowel obstruction. Normal appendix.  There is aortoiliac atherosclerotic disease. No portal venous gas identified. There is no adenopathy. Osteopenia with degenerative changes of the spine. Old healed right eleventh rib fracture. No acute fracture. Total left hip arthroplasty.  IMPRESSION: Diffuse bladder wall thickening most compatible with cystitis. Correlation with urinalysis recommended.  There is no hydronephrosis or nephrolithiasis. Probable bilateral renal cysts. Ultrasound may provide  better evaluation.  Diarrheal state. Correlation with clinical exam and stool cultures recommended. No bowel obstruction. Normal appendix.   Electronically Signed   By: Elgie Collard M.D.   On: 10/10/2014 01:43   Dg Chest Port 1 View  10/10/2014   CLINICAL DATA:  Shortness of breath.  EXAM: PORTABLE CHEST - 1 VIEW  COMPARISON:  Radiographs obtained yesterday at 1948 hour.  FINDINGS: Lung volumes are low. The cardiomediastinal contours are unchanged allowing for differences in technique. Decreased atelectasis at the left lung base, with minimal increase in right basilar atelectasis. No pulmonary edema. No consolidation, pleural effusion, or pneumothorax. No acute osseous abnormalities are seen.  IMPRESSION: Shifting atelectasis at the lung bases, improved on the left but worsening on the right.   Electronically Signed   By: Rubye Oaks M.D.   On: 10/10/2014 05:21   Dg Chest Port 1 View  10/09/2014   CLINICAL DATA:  64 year old male with fever  EXAM: PORTABLE CHEST - 1 VIEW  COMPARISON:  Chest radiograph dated 09/29/2014  FINDINGS: Single-view of the chest demonstrate minimal atelectatic changes of the left lung base. There is no focal consolidation, pleural effusion, or pneumothorax. Stable cardiac silhouette. The osseous structures are grossly unremarkable.  IMPRESSION: Left lung base subsegmental atelectasis.  No focal consolidation.   Electronically Signed   By: Elgie Collard M.D.   On: 10/09/2014 20:13    Scheduled Meds: . heparin  5,000 Units Subcutaneous 3 times per day  . OLANZapine  5 mg Oral BID  . pantoprazole (PROTONIX) IV  40 mg Intravenous Q24H  . piperacillin-tazobactam (ZOSYN)  IV  3.375 g Intravenous 3 times per day  . sodium chloride  3 mL Intravenous Q12H  . tamsulosin  0.4 mg Oral QPC breakfast  . trihexyphenidyl  2.5 mg Oral Daily  . vitamin B-12  1,000 mcg Oral Daily   Continuous Infusions: . sodium chloride 125 mL/hr at 10/10/14 1354    Principal Problem:    Sepsis secondary to UTI Active Problems:   Hypertension   Schizophrenia, paranoid type   DM type 2 causing CKD stage 3   Chronic anemia   Acute renal failure superimposed on stage 3 chronic kidney disease   Lactic acidosis   UTI (lower urinary tract infection)    Time spent: 35 min    Jeralyn Bennett  [facility] Pager [phone]. If 7PM-7AM, please contact night-coverage at [email or web address], password [facility] 10/11/2014, 8:44 AM  LOS: 2 days

## 2014-10-12 LAB — CBC
HCT: 26.9 % — ABNORMAL LOW (ref 39.0–52.0)
HEMOGLOBIN: 8.8 g/dL — AB (ref 13.0–17.0)
MCH: 28.5 pg (ref 26.0–34.0)
MCHC: 32.7 g/dL (ref 30.0–36.0)
MCV: 87.1 fL (ref 78.0–100.0)
Platelets: 379 10*3/uL (ref 150–400)
RBC: 3.09 MIL/uL — AB (ref 4.22–5.81)
RDW: 14.1 % (ref 11.5–15.5)
WBC: 4.7 10*3/uL (ref 4.0–10.5)

## 2014-10-12 LAB — URINE CULTURE: Culture: >=100000

## 2014-10-12 LAB — BASIC METABOLIC PANEL
ANION GAP: 9 (ref 5–15)
BUN: 21 mg/dL — ABNORMAL HIGH (ref 6–20)
CALCIUM: 8 mg/dL — AB (ref 8.9–10.3)
CO2: 19 mmol/L — ABNORMAL LOW (ref 22–32)
Chloride: 111 mmol/L (ref 101–111)
Creatinine, Ser: 2.15 mg/dL — ABNORMAL HIGH (ref 0.61–1.24)
GFR, EST AFRICAN AMERICAN: 36 mL/min — AB (ref >60)
GFR, EST NON AFRICAN AMERICAN: 31 mL/min — AB (ref >60)
GLUCOSE: 100 mg/dL — AB (ref 65–99)
Potassium: 3.7 mmol/L (ref 3.5–5.1)
SODIUM: 139 mmol/L (ref 135–145)

## 2014-10-12 NOTE — Progress Notes (Signed)
[facility] PROGRESS NOTE  David Lang NFA:[id number] DOB: 07/31/1950 DOA: 10/09/2014 PCP: Florentina Jenny, MD  Assessment/Plan: 1. Sepsis -Present on admission, evidenced by a white count of 27,600, development of acute renal failure, temperature 103 with source of infection likely to be urinary tract infection. Urine appeared turbid with UA showing the presence of many bacteria, and large amount of leukocytes. -Patient recently hospitalized, foley catheter may have been utilized on recent hospitalization -Urine blood cultures growing >100,000 CFU, pending susceptibilities -On a patient showing significant clinical improvement this morning, he was assisted out of bed to chair. As mentioned above source of infection is likely urinary tract infection for which I'll discontinue his vancomycin and keep him on Zosyn for the next 24 hours. Await finalization of urine cultures  2.  Acute on chronic renal failure. -Creatinine trending down from 3.17-2.44 after receiving IV fluid resuscitation. His renal failure was likely secondary to ATN in setting of hypotension and probably prerenal azothemia/hypovolemia from decreased by mouth intake. -His kidney function improving, creatinine trending down to 2.12 from 3.17 on admission. -We'll stop IV fluids and encourage oral intake  3.  Acute encephalopathy/delirium. -Patient presenting with delirium likely secondary to infectious process. He was noted to have an acute change with inattention and mental status changes. -I think he is showing clinical improvement today, he was assisted out of bed to chair. He appears stronger and less confused.  -Physical therapy consulted  4.  History of GI bleed. -He was recently hospitalized undergoing upper endoscopy on 09/30/2014 that revealed a Mallory-Weiss tear and severe distal and mid ulcerated esophagitis. -Continue proton pump inhibitor therapy.  5.  History of type 2 diabetes mellitus -His diet was advanced,  blood sugar stable.  6.  History of hypertension -Anti-hypertensive agents held due to the presentation of hypotension and sepsis  7.  Diarrhea -Patient having several episodes of diarrhea yesterday, a stool for C. difficile is pending at the time of this dictation  8.  Urinary tract infection -Urine cultures growing greater than 100,000 colony forming units of a:, Pending susceptibility testing -We'll continue IV Zosyn  Code Status: Full code Family Communication: Spoke with his sister over telephone Disposition Plan: Transfer to MedSurg floor   Consultants:    Procedures:    Antibiotics:  Zosyn  HPI/Subjective: Patient is a 64 year old gentleman with multiple comorbidities including diabetes mellitus, hypertension, recent hospitalization for upper GI bleed status post upper endoscopy on 09/30/2014, discharged from the medicine service on 10/03/2014 presented to the emergency department with mental status changes. He presented as a transfer from his facility. On admission his found to be septic having a white count of 27,600, acute renal failure, temperature of 103, with source of infection likely to be urinary tract. He was started on broad-spectrum IV antimicrobial therapy with vancomycin and Zosyn.  Objective: Filed Vitals:   10/12/14 1400  BP: 163/78  Pulse: 98  Temp: 101.1 F (38.4 C)  Resp: 16    Intake/Output Summary (Last 24 hours) at 10/12/14 1422 Last data filed at 10/12/14 0853  Gross per 24 hour  Intake 1702.5 ml  Output   1175 ml  Net  527.5 ml   Filed Weights   10/10/14 0539  Weight: 81.7 kg (180 lb 1.9 oz)    Exam:   General:  Appears better, he is more awake and alert, following commands, was assisted out of bed to chair. Less toxic appearing.  Cardiovascular: regular rate rhythm normal S1-S2  Respiratory: Normal respiratory effort lungs  clear to auscultation  Abdomen: Soft nontender nondistended  Musculoskeletal: No edema  Data  Reviewed: Basic Metabolic Panel:  Recent Labs Lab 10/09/14 1850 10/10/14 0132 10/11/14 0340 10/12/14 0535  NA 134*  --  136 139  K 4.2  --  4.2 3.7  CL 104  --  109 111  CO2 19*  --  17* 19*  GLUCOSE 175*  --  88 100*  BUN 41*  --  29* 21*  CREATININE 3.19* 3.17* 2.44* 2.15*  CALCIUM 8.4*  --  7.8* 8.0*  MG  --  1.6*  --   --    Liver Function Tests:  Recent Labs Lab 10/09/14 1850  AST 14*  ALT 8*  ALKPHOS 46  BILITOT 0.7  PROT 6.5  ALBUMIN 3.1*   No results for input(s): LIPASE, AMYLASE in the last 168 hours.  Recent Labs Lab 10/09/14 1850  AMMONIA 40*   CBC:  Recent Labs Lab 10/09/14 1850 10/10/14 0132 10/11/14 0340 10/12/14 0535  WBC 27.6* 24.7* 14.9* 4.7  NEUTROABS 23.2*  --   --   --   HGB 8.5* 8.4* 9.0* 8.8*  HCT 25.3* 25.0* 27.8* 26.9*  MCV 87.8 90.3 88.8 87.1  PLT 415* 385 376 379   Cardiac Enzymes:  Recent Labs Lab 10/10/14 0132 10/10/14 0745 10/10/14 1335  TROPONINI <0.03 <0.03 0.05*   BNP (last 3 results) No results for input(s): BNP in the last 8760 hours.  ProBNP (last 3 results) No results for input(s): PROBNP in the last 8760 hours.  CBG: No results for input(s): GLUCAP in the last 168 hours.  Recent Results (from the past 240 hour(s))  Blood Culture (routine x 2)     Status: None (Preliminary result)   Collection Time: 10/09/14  8:26 PM  Result Value Ref Range Status   Specimen Description BLOOD LEFT HAND  Final   Special Requests BOTTLES DRAWN AEROBIC AND ANAEROBIC 10CC  Final   Culture   Final    NO GROWTH 3 DAYS Performed at [facility]    Report Status PENDING  Incomplete  Blood Culture (routine x 2)     Status: None (Preliminary result)   Collection Time: 10/09/14  8:26 PM  Result Value Ref Range Status   Specimen Description BLOOD LEFT FOREARM  Final   Special Requests   Final    BOTTLES DRAWN AEROBIC AND ANAEROBIC 5CC BLUE 10CC RED   Culture   Final    NO GROWTH 3 DAYS Performed at [facility]    Report Status PENDING  Incomplete  Urine culture     Status: None   Collection Time: 10/09/14 10:08 PM  Result Value Ref Range Status   Specimen Description URINE, CATHETERIZED  Final   Special Requests NONE  Final   Culture   Final    >=100,000 COLONIES/mL ESCHERICHIA COLI Performed at [facility]    Report Status 10/12/2014 FINAL  Final   Organism ID, Bacteria ESCHERICHIA COLI  Final      Susceptibility   Escherichia coli - MIC*    AMPICILLIN >=32 RESISTANT Resistant     CEFAZOLIN <=4 SENSITIVE Sensitive     CEFTRIAXONE <=1 SENSITIVE Sensitive     CIPROFLOXACIN <=0.25 SENSITIVE Sensitive     GENTAMICIN <=1 SENSITIVE Sensitive     IMIPENEM <=0.25 SENSITIVE Sensitive     NITROFURANTOIN <=16 SENSITIVE Sensitive     TRIMETH/SULFA <=20 SENSITIVE Sensitive     AMPICILLIN/SULBACTAM 16 INTERMEDIATE Intermediate  PIP/TAZO <=4 SENSITIVE Sensitive     * >=100,000 COLONIES/mL ESCHERICHIA COLI     Studies: No results found.  Scheduled Meds: . heparin  5,000 Units Subcutaneous 3 times per day  . OLANZapine  5 mg Oral BID  . pantoprazole  40 mg Oral BID  . piperacillin-tazobactam (ZOSYN)  IV  3.375 g Intravenous 3 times per day  . sodium chloride  3 mL Intravenous Q12H  . tamsulosin  0.4 mg Oral QPC breakfast  . trihexyphenidyl  2.5 mg Oral Daily  . vitamin B-12  1,000 mcg Oral Daily   Continuous Infusions:    Principal Problem:   Sepsis secondary to UTI Active Problems:   Hypertension   Schizophrenia, paranoid type   DM type 2 causing CKD stage 3   Chronic anemia   Acute renal failure superimposed on stage 3 chronic kidney disease   Lactic acidosis   UTI (lower urinary tract infection)    Time spent: 30 min    Jeralyn Bennett  [facility] Pager [phone]. If 7PM-7AM, please contact night-coverage at [email or web address], password [facility] 10/12/2014, 2:22 PM  LOS: 3 days

## 2014-10-12 NOTE — Progress Notes (Signed)
PT Cancellation Note  Patient Details Name: David Lang MRN: [id number] DOB: [birth year]   Cancelled Treatment:    Reason Eval/Treat Not Completed: Fatigue/lethargy limiting ability to participate (pt had just been up with nursing and is tired, wants to rest right now. Will attempt later. )   Tamala Ser 10/12/2014, 11:50 AM [phone]

## 2014-10-13 DIAGNOSIS — I10 Essential (primary) hypertension: Secondary | ICD-10-CM

## 2014-10-13 DIAGNOSIS — F200 Paranoid schizophrenia: Secondary | ICD-10-CM

## 2014-10-13 LAB — CBC
HEMATOCRIT: 26.5 % — AB (ref 39.0–52.0)
Hemoglobin: 8.5 g/dL — ABNORMAL LOW (ref 13.0–17.0)
MCH: 28.1 pg (ref 26.0–34.0)
MCHC: 32.1 g/dL (ref 30.0–36.0)
MCV: 87.7 fL (ref 78.0–100.0)
Platelets: 371 10*3/uL (ref 150–400)
RBC: 3.02 MIL/uL — ABNORMAL LOW (ref 4.22–5.81)
RDW: 14.2 % (ref 11.5–15.5)
WBC: 4.2 10*3/uL (ref 4.0–10.5)

## 2014-10-13 LAB — BASIC METABOLIC PANEL
Anion gap: 8 (ref 5–15)
BUN: 18 mg/dL (ref 6–20)
CHLORIDE: 112 mmol/L — AB (ref 101–111)
CO2: 20 mmol/L — AB (ref 22–32)
Calcium: 7.9 mg/dL — ABNORMAL LOW (ref 8.9–10.3)
Creatinine, Ser: 1.98 mg/dL — ABNORMAL HIGH (ref 0.61–1.24)
GFR calc Af Amer: 39 mL/min — ABNORMAL LOW (ref >60)
GFR calc non Af Amer: 34 mL/min — ABNORMAL LOW (ref >60)
GLUCOSE: 166 mg/dL — AB (ref 65–99)
POTASSIUM: 3.7 mmol/L (ref 3.5–5.1)
Sodium: 140 mmol/L (ref 135–145)

## 2014-10-13 NOTE — Progress Notes (Signed)
PT Cancellation Note  Patient Details Name: David Lang MRN: [id number] DOB: 1951/01/17   Cancelled Treatment:    Reason Eval/Treat Not Completed: Patient declined, no reason specified. Pt would not agree to participate despite max encouragement.    Rebeca Alert, MPT Pager: [phone]

## 2014-10-13 NOTE — Progress Notes (Addendum)
[facility] PROGRESS NOTE  David Lang YQM:[id number] DOB: [birth year] DOA: 10/09/2014 PCP: Florentina Jenny, MD  Assessment/Plan: 1. Sepsis -Present on admission, evidenced by a white count of 27,600, development of acute renal failure, temperature 103 with source of infection likely to be urinary tract infection. Urine appeared turbid with UA showing the presence of many bacteria, and large amount of leukocytes. -Patient recently hospitalized, foley catheter may have been utilized on recent hospitalization -Urine blood cultures growing E.coli organism resistant to ampicillin with intermediate susceptibility to ampicillin/sulbactam. It was susceptible to Zosyn, nitrofurantoin, ciprofloxacin, ceftriaxone and Cefazolin.  -This morning he spiked a temperature of 100.8. Will continue IV Zosyn for now, blood cultures obtained on 10/09/2014 showing no growth 2 sets.  2.  Acute on chronic renal failure. -Creatinine trending down from 3.17-2.44 after receiving IV fluid resuscitation. His renal failure was likely secondary to ATN in setting of hypotension and probably prerenal azothemia/hypovolemia from decreased by mouth intake. -Renal function remained stable at 1.98. He has a history of stage III/IV chronic kidney disease having baseline creatinine near 2.0.  3.  Acute encephalopathy/delirium. -Patient presenting with delirium likely secondary to infectious process. He was noted to have an acute change with inattention and mental status changes. -I think he is showing clinical improvement today, he was assisted out of bed to chair. He appears stronger and less confused.  -Physical therapy consulted  4.  History of GI bleed. -He was recently hospitalized undergoing upper endoscopy on 09/30/2014 that revealed a Mallory-Weiss tear and severe distal and mid ulcerated esophagitis. -Continue proton pump inhibitor therapy.  5.  History of type 2 diabetes mellitus -His diet was advanced, blood sugar  stable.  6.  History of hypertension -Anti-hypertensive agents held due to the presentation of hypotension and sepsis -Blood pressures trending upward, will restart his amlodipine at 10 mg by mouth daily and atenolol 100 mg by mouth daily. For now will continue holding Hydralazine and ACE inhibitor  7.  Diarrhea -Patient having formed stools this morning. Unlikely to have C. difficile colitis  8.  Urinary tract infection -Urine cultures positive for Ecoli, organism susceptible to Zosyn. -Will continue IV Zosyn since he spiked a temperature of 100.8 this morning  Code Status: Full code Family Communication: Spoke with his sister over telephone Disposition Plan: Transfer to MedSurg floor   Consultants:    Procedures:    Antibiotics:  Zosyn  HPI/Subjective: Patient is a 64 year old gentleman with multiple comorbidities including diabetes mellitus, hypertension, recent hospitalization for upper GI bleed status post upper endoscopy on 09/30/2014, discharged from the medicine service on 10/03/2014 presented to the emergency department with mental status changes. He presented as a transfer from his facility. On admission his found to be septic having a white count of 27,600, acute renal failure, temperature of 103, with source of infection likely to be urinary tract. He was started on broad-spectrum IV antimicrobial therapy with vancomycin and Zosyn.  Objective: Filed Vitals:   10/13/14 1427  BP: 123/85  Pulse: 84  Temp: 98.3 F (36.8 C)  Resp: 18    Intake/Output Summary (Last 24 hours) at 10/13/14 1632 Last data filed at 10/13/14 1330  Gross per 24 hour  Intake    540 ml  Output    550 ml  Net    -10 ml   Filed Weights   10/10/14 0539  Weight: 81.7 kg (180 lb 1.9 oz)    Exam:   General:  Continues to do well. He is talkative  Cardiovascular: regular rate rhythm normal S1-S2  Respiratory: Normal respiratory effort lungs clear to auscultation  Abdomen: Soft  nontender nondistended  Musculoskeletal: No edema  Data Reviewed: Basic Metabolic Panel:  Recent Labs Lab 10/09/14 1850 10/10/14 0132 10/11/14 0340 10/12/14 0535 10/13/14 0556  NA 134*  --  136 139 140  K 4.2  --  4.2 3.7 3.7  CL 104  --  109 111 112*  CO2 19*  --  17* 19* 20*  GLUCOSE 175*  --  88 100* 166*  BUN 41*  --  29* 21* 18  CREATININE 3.19* 3.17* 2.44* 2.15* 1.98*  CALCIUM 8.4*  --  7.8* 8.0* 7.9*  MG  --  1.6*  --   --   --    Liver Function Tests:  Recent Labs Lab 10/09/14 1850  AST 14*  ALT 8*  ALKPHOS 46  BILITOT 0.7  PROT 6.5  ALBUMIN 3.1*   No results for input(s): LIPASE, AMYLASE in the last 168 hours.  Recent Labs Lab 10/09/14 1850  AMMONIA 40*   CBC:  Recent Labs Lab 10/09/14 1850 10/10/14 0132 10/11/14 0340 10/12/14 0535 10/13/14 0556  WBC 27.6* 24.7* 14.9* 4.7 4.2  NEUTROABS 23.2*  --   --   --   --   HGB 8.5* 8.4* 9.0* 8.8* 8.5*  HCT 25.3* 25.0* 27.8* 26.9* 26.5*  MCV 87.8 90.3 88.8 87.1 87.7  PLT 415* 385 376 379 371   Cardiac Enzymes:  Recent Labs Lab 10/10/14 0132 10/10/14 0745 10/10/14 1335  TROPONINI <0.03 <0.03 0.05*   BNP (last 3 results) No results for input(s): BNP in the last 8760 hours.  ProBNP (last 3 results) No results for input(s): PROBNP in the last 8760 hours.  CBG: No results for input(s): GLUCAP in the last 168 hours.  Recent Results (from the past 240 hour(s))  Blood Culture (routine x 2)     Status: None (Preliminary result)   Collection Time: 10/09/14  8:26 PM  Result Value Ref Range Status   Specimen Description BLOOD LEFT HAND  Final   Special Requests BOTTLES DRAWN AEROBIC AND ANAEROBIC 10CC  Final   Culture   Final    NO GROWTH 4 DAYS Performed at [facility]    Report Status PENDING  Incomplete  Blood Culture (routine x 2)     Status: None (Preliminary result)   Collection Time: 10/09/14  8:26 PM  Result Value Ref Range Status   Specimen Description BLOOD LEFT FOREARM   Final   Special Requests   Final    BOTTLES DRAWN AEROBIC AND ANAEROBIC 5CC BLUE 10CC RED   Culture   Final    NO GROWTH 4 DAYS Performed at [facility]    Report Status PENDING  Incomplete  Urine culture     Status: None   Collection Time: 10/09/14 10:08 PM  Result Value Ref Range Status   Specimen Description URINE, CATHETERIZED  Final   Special Requests NONE  Final   Culture   Final    >=100,000 COLONIES/mL ESCHERICHIA COLI Performed at [facility]    Report Status 10/12/2014 FINAL  Final   Organism ID, Bacteria ESCHERICHIA COLI  Final      Susceptibility   Escherichia coli - MIC*    AMPICILLIN >=32 RESISTANT Resistant     CEFAZOLIN <=4 SENSITIVE Sensitive     CEFTRIAXONE <=1 SENSITIVE Sensitive     CIPROFLOXACIN <=0.25 SENSITIVE Sensitive     GENTAMICIN <=1 SENSITIVE Sensitive  IMIPENEM <=0.25 SENSITIVE Sensitive     NITROFURANTOIN <=16 SENSITIVE Sensitive     TRIMETH/SULFA <=20 SENSITIVE Sensitive     AMPICILLIN/SULBACTAM 16 INTERMEDIATE Intermediate     PIP/TAZO <=4 SENSITIVE Sensitive     * >=100,000 COLONIES/mL ESCHERICHIA COLI     Studies: No results found.  Scheduled Meds: . heparin  5,000 Units Subcutaneous 3 times per day  . OLANZapine  5 mg Oral BID  . pantoprazole  40 mg Oral BID  . piperacillin-tazobactam (ZOSYN)  IV  3.375 g Intravenous 3 times per day  . sodium chloride  3 mL Intravenous Q12H  . tamsulosin  0.4 mg Oral QPC breakfast  . trihexyphenidyl  2.5 mg Oral Daily  . vitamin B-12  1,000 mcg Oral Daily   Continuous Infusions:    Principal Problem:   Sepsis secondary to UTI Active Problems:   Hypertension   Schizophrenia, paranoid type   DM type 2 causing CKD stage 3   Chronic anemia   Acute renal failure superimposed on stage 3 chronic kidney disease   Lactic acidosis   UTI (lower urinary tract infection)    Time spent: 30 min    Jeralyn Bennett  [facility] Pager [phone]. If 7PM-7AM, please contact  night-coverage at [email or web address], password [facility] 10/13/2014, 4:32 PM  LOS: 4 days

## 2014-10-14 LAB — CBC
HEMATOCRIT: 27.3 % — AB (ref 39.0–52.0)
Hemoglobin: 9 g/dL — ABNORMAL LOW (ref 13.0–17.0)
MCH: 28.9 pg (ref 26.0–34.0)
MCHC: 33 g/dL (ref 30.0–36.0)
MCV: 87.8 fL (ref 78.0–100.0)
Platelets: 418 10*3/uL — ABNORMAL HIGH (ref 150–400)
RBC: 3.11 MIL/uL — ABNORMAL LOW (ref 4.22–5.81)
RDW: 14.3 % (ref 11.5–15.5)
WBC: 5.6 10*3/uL (ref 4.0–10.5)

## 2014-10-14 LAB — CULTURE, BLOOD (ROUTINE X 2)
CULTURE: NO GROWTH
CULTURE: NO GROWTH

## 2014-10-14 LAB — BASIC METABOLIC PANEL
Anion gap: 9 (ref 5–15)
BUN: 15 mg/dL (ref 6–20)
CHLORIDE: 110 mmol/L (ref 101–111)
CO2: 22 mmol/L (ref 22–32)
Calcium: 8.2 mg/dL — ABNORMAL LOW (ref 8.9–10.3)
Creatinine, Ser: 2.11 mg/dL — ABNORMAL HIGH (ref 0.61–1.24)
GFR calc Af Amer: 36 mL/min — ABNORMAL LOW (ref >60)
GFR calc non Af Amer: 31 mL/min — ABNORMAL LOW (ref >60)
GLUCOSE: 159 mg/dL — AB (ref 65–99)
POTASSIUM: 3.9 mmol/L (ref 3.5–5.1)
SODIUM: 141 mmol/L (ref 135–145)

## 2014-10-14 MED ORDER — AMLODIPINE BESYLATE 5 MG PO TABS
5.0000 mg | ORAL_TABLET | Freq: Every day | ORAL | Status: DC
  Administered 2014-10-14: 5 mg via ORAL
  Filled 2014-10-14 (×2): qty 1

## 2014-10-14 NOTE — Progress Notes (Signed)
[facility] PROGRESS NOTE  David Lang ZOX:[id number] DOB: 1950-12-16 DOA: 10/09/2014 PCP: Florentina Jenny, MD  Assessment/Plan: 1. Sepsis -Present on admission, evidenced by a white count of 27,600, development of acute renal failure, temperature 103 with source of infection likely to be urinary tract infection. Urine appeared turbid with UA showing the presence of many bacteria, and large amount of leukocytes. -Patient recently hospitalized, foley catheter may have been utilized on recent hospitalization -Urine blood cultures growing E.coli organism resistant to ampicillin with intermediate susceptibility to ampicillin/sulbactam. It was susceptible to Zosyn, nitrofurantoin, ciprofloxacin, ceftriaxone and Cefazolin.  -He remained afebrile overnight, I plan to transition to oral antimicrobial therapy in a.m. if he continues to do well and not spike further temperatures.  2.  Acute on chronic renal failure. -Creatinine trending down from 3.17-2.44 after receiving IV fluid resuscitation. His renal failure was likely secondary to ATN in setting of hypotension and probably prerenal azothemia/hypovolemia from decreased by mouth intake. - He has a history of stage III/IV chronic kidney disease having baseline creatinine near 2.0. -A.m. labs showing creatinine of 2.1.  3.  Acute encephalopathy/delirium. -Patient presenting with delirium likely secondary to infectious process. He was noted to have an acute change with inattention and mental status changes. -This morning he seems a little sleepy, I spoke to his sister who told me on occasion can have excessive daytime sleepiness from not sleeping well at night or from the fact of his anti-psychotics. Will reassess later.  -Physical therapy consulted  4.  History of GI bleed. -He was recently hospitalized undergoing upper endoscopy on 09/30/2014 that revealed a Mallory-Weiss tear and severe distal and mid ulcerated esophagitis. -Continue proton pump  inhibitor therapy.  5.  History of type 2 diabetes mellitus -His diet was advanced, blood sugar stable.  6.  History of hypertension -Anti-hypertensive agents held due to the presentation of hypotension and sepsis -Blood pressures trending upward, will restart his amlodipine at 10 mg by mouth daily and atenolol 100 mg by mouth daily. For now will continue holding Hydralazine and ACE inhibitor  7.  Diarrhea -Patient having formed stools this morning. Unlikely to have C. difficile colitis  8.  Urinary tract infection -Urine cultures positive for Ecoli, organism susceptible to Zosyn. -Will continue IV Zosyn for 1 more day, anticipate transition to oral antibiotic therapy and in a.m.  Code Status: Full code Family Communication: Spoke with his sister over telephone Disposition Plan: Patient seen by physical therapy recommending home health PT   Consultants:  Physical therapy   Antibiotics:  Zosyn  HPI/Subjective: Patient is a 64 year old gentleman with multiple comorbidities including diabetes mellitus, hypertension, recent hospitalization for upper GI bleed status post upper endoscopy on 09/30/2014, discharged from the medicine service on 10/03/2014 presented to the emergency department with mental status changes. He presented as a transfer from his facility. On admission his found to be septic having a white count of 27,600, acute renal failure, temperature of 103, with source of infection likely to be urinary tract. He was started on broad-spectrum IV antimicrobial therapy with vancomycin and Zosyn.  Objective: Filed Vitals:   10/14/14 1413  BP: 166/86  Pulse: 80  Temp: 98.3 F (36.8 C)  Resp: 18    Intake/Output Summary (Last 24 hours) at 10/14/14 1439 Last data filed at 10/14/14 1438  Gross per 24 hour  Intake    480 ml  Output   1601 ml  Net  -1121 ml   Filed Weights   10/10/14 0539  Weight: 81.7  kg (180 lb 1.9 oz)    Exam:   General:  Patient seems a more  sleepy today, not as talkative as yesterday  Cardiovascular: regular rate rhythm normal S1-S2  Respiratory: Normal respiratory effort lungs clear to auscultation  Abdomen: Soft nontender nondistended  Musculoskeletal: No edema  Data Reviewed: Basic Metabolic Panel:  Recent Labs Lab 10/09/14 1850 10/10/14 0132 10/11/14 0340 10/12/14 0535 10/13/14 0556 10/14/14 0545  NA 134*  --  136 139 140 141  K 4.2  --  4.2 3.7 3.7 3.9  CL 104  --  109 111 112* 110  CO2 19*  --  17* 19* 20* 22  GLUCOSE 175*  --  88 100* 166* 159*  BUN 41*  --  29* 21* 18 15  CREATININE 3.19* 3.17* 2.44* 2.15* 1.98* 2.11*  CALCIUM 8.4*  --  7.8* 8.0* 7.9* 8.2*  MG  --  1.6*  --   --   --   --    Liver Function Tests:  Recent Labs Lab 10/09/14 1850  AST 14*  ALT 8*  ALKPHOS 46  BILITOT 0.7  PROT 6.5  ALBUMIN 3.1*   No results for input(s): LIPASE, AMYLASE in the last 168 hours.  Recent Labs Lab 10/09/14 1850  AMMONIA 40*   CBC:  Recent Labs Lab 10/09/14 1850 10/10/14 0132 10/11/14 0340 10/12/14 0535 10/13/14 0556 10/14/14 0545  WBC 27.6* 24.7* 14.9* 4.7 4.2 5.6  NEUTROABS 23.2*  --   --   --   --   --   HGB 8.5* 8.4* 9.0* 8.8* 8.5* 9.0*  HCT 25.3* 25.0* 27.8* 26.9* 26.5* 27.3*  MCV 87.8 90.3 88.8 87.1 87.7 87.8  PLT 415* 385 376 379 371 418*   Cardiac Enzymes:  Recent Labs Lab 10/10/14 0132 10/10/14 0745 10/10/14 1335  TROPONINI <0.03 <0.03 0.05*   BNP (last 3 results) No results for input(s): BNP in the last 8760 hours.  ProBNP (last 3 results) No results for input(s): PROBNP in the last 8760 hours.  CBG: No results for input(s): GLUCAP in the last 168 hours.  Recent Results (from the past 240 hour(s))  Blood Culture (routine x 2)     Status: None   Collection Time: 10/09/14  8:26 PM  Result Value Ref Range Status   Specimen Description BLOOD LEFT HAND  Final   Special Requests BOTTLES DRAWN AEROBIC AND ANAEROBIC 10CC  Final   Culture   Final    NO GROWTH 5  DAYS Performed at [facility]    Report Status 10/14/2014 FINAL  Final  Blood Culture (routine x 2)     Status: None   Collection Time: 10/09/14  8:26 PM  Result Value Ref Range Status   Specimen Description BLOOD LEFT FOREARM  Final   Special Requests   Final    BOTTLES DRAWN AEROBIC AND ANAEROBIC 5CC BLUE 10CC RED   Culture   Final    NO GROWTH 5 DAYS Performed at [facility]    Report Status 10/14/2014 FINAL  Final  Urine culture     Status: None   Collection Time: 10/09/14 10:08 PM  Result Value Ref Range Status   Specimen Description URINE, CATHETERIZED  Final   Special Requests NONE  Final   Culture   Final    >=100,000 COLONIES/mL ESCHERICHIA COLI Performed at [facility]    Report Status 10/12/2014 FINAL  Final   Organism ID, Bacteria ESCHERICHIA COLI  Final  Susceptibility   Escherichia coli - MIC*    AMPICILLIN >=32 RESISTANT Resistant     CEFAZOLIN <=4 SENSITIVE Sensitive     CEFTRIAXONE <=1 SENSITIVE Sensitive     CIPROFLOXACIN <=0.25 SENSITIVE Sensitive     GENTAMICIN <=1 SENSITIVE Sensitive     IMIPENEM <=0.25 SENSITIVE Sensitive     NITROFURANTOIN <=16 SENSITIVE Sensitive     TRIMETH/SULFA <=20 SENSITIVE Sensitive     AMPICILLIN/SULBACTAM 16 INTERMEDIATE Intermediate     PIP/TAZO <=4 SENSITIVE Sensitive     * >=100,000 COLONIES/mL ESCHERICHIA COLI     Studies: No results found.  Scheduled Meds: . amLODipine  5 mg Oral Daily  . heparin  5,000 Units Subcutaneous 3 times per day  . OLANZapine  5 mg Oral BID  . pantoprazole  40 mg Oral BID  . piperacillin-tazobactam (ZOSYN)  IV  3.375 g Intravenous 3 times per day  . sodium chloride  3 mL Intravenous Q12H  . tamsulosin  0.4 mg Oral QPC breakfast  . trihexyphenidyl  2.5 mg Oral Daily  . vitamin B-12  1,000 mcg Oral Daily   Continuous Infusions:    Principal Problem:   Sepsis secondary to UTI Active Problems:   Hypertension   Schizophrenia, paranoid type   DM type 2  causing CKD stage 3   Chronic anemia   Acute renal failure superimposed on stage 3 chronic kidney disease   Lactic acidosis   UTI (lower urinary tract infection)    Time spent: 25 min    Jeralyn Bennett  [facility] Pager [phone]. If 7PM-7AM, please contact night-coverage at [email or web address], password [facility] 10/14/2014, 2:39 PM  LOS: 5 days

## 2014-10-14 NOTE — Evaluation (Signed)
Physical Therapy Evaluation Patient Details Name: David Lang MRN: [id number] DOB: [birth year] Today's Date: 10/14/2014   History of Present Illness  64 year old male with a history of hypertension diabetes, schizophrenia, recent admission for hematemesis secondary to Mallory-Weiss tear and mid ulcerative esophagitis, status post EGD on 9/4, status post 2 units of packed red blood cells, discharged on 9/7, who presents to the ER with several episodes of diarrhea. Dx of sepsis 2* UTI.  Clinical Impression  Pt admitted with above diagnosis. Pt currently with functional limitations due to the deficits listed below (see PT Problem List). Pt quite resistant to mobility. With max encouragement he performed bed mobility with mod assist and sit to stand with min assist. He stood briefly with RW, refused to attempt ambulation, then returned to bed.  Pt will benefit from skilled PT to increase their independence and safety with mobility to allow discharge to the venue listed below.       Follow Up Recommendations Supervision for mobility/OOB;Home health PT    Equipment Recommendations  None recommended by PT    Recommendations for Other Services       Precautions / Restrictions Precautions Precautions: Fall Restrictions Weight Bearing Restrictions: No      Mobility  Bed Mobility Overal bed mobility: Needs Assistance Bed Mobility: Rolling;Sidelying to Sit Rolling: Min assist Sidelying to sit: Mod assist       General bed mobility comments: increased time, assist to raise trunk  Transfers Overall transfer level: Needs assistance Equipment used: Rolling walker (2 wheeled) Transfers: Sit to/from Stand Sit to Stand: Min assist;From elevated surface         General transfer comment: pt stood for 30 seconds with RW, stated he felt dizzy and that walking wouldn't help him, took 2 sidesteps to [facility] with RW with min/guard assist then returned to supine, HR 115 with  activity  Ambulation/Gait Ambulation/Gait assistance: Min guard Ambulation Distance (Feet): 2 Feet Assistive device: Rolling walker (2 wheeled) Gait Pattern/deviations: Step-to pattern     General Gait Details: 2 sidestep to [facility] with RW, pt refused to ambulate further  Stairs            Wheelchair Mobility    Modified Rankin (Stroke Patients Only)       Balance                                             Pertinent Vitals/Pain Pain Assessment: No/denies pain    Home Living Family/patient expects to be discharged to:: Assisted living                 Additional Comments: pt stated he's from [place]    Prior Function           Comments: pt stated he used a walker prior to admission     Hand Dominance        Extremity/Trunk Assessment   Upper Extremity Assessment: Generalized weakness           Lower Extremity Assessment: Generalized weakness      Cervical / Trunk Assessment: Kyphotic  Communication   Communication: No difficulties  Cognition Arousal/Alertness: Awake/alert Behavior During Therapy: Flat affect Overall Cognitive Status: No family/caregiver present to determine baseline cognitive functioning (noted h/o schizophrenia, pt stated his leg is broken, per chart he had LE fx over a year ago, pt adamant that getting OOB and walking will  not help him)                      General Comments      Exercises        Assessment/Plan    PT Assessment Patient needs continued PT services  PT Diagnosis Generalized weakness   PT Problem List Decreased strength;Decreased activity tolerance;Decreased mobility  PT Treatment Interventions Gait training;Functional mobility training;Therapeutic activities;Patient/family education;Therapeutic exercise   PT Goals (Current goals can be found in the Care Plan section) Acute Rehab PT Goals Patient Stated Goal: pt stated he just needs rest PT Goal Formulation: With  patient Time For Goal Achievement: 10/28/14 Potential to Achieve Goals: Good    Frequency Min 3X/week   Barriers to discharge        Co-evaluation               End of Session Equipment Utilized During Treatment: Gait belt Activity Tolerance: Patient limited by fatigue Patient left: in bed;with call bell/phone within reach;with bed alarm set Nurse Communication: Mobility status         Time: 8119-1478 PT Time Calculation (min) (ACUTE ONLY): 19 min   Charges:   PT Evaluation $Initial PT Evaluation Tier I: 1 Procedure     PT G CodesTamala Ser 10/14/2014, 9:06 AM [phone]

## 2014-10-14 NOTE — Progress Notes (Signed)
ANTIBIOTIC CONSULT NOTE - Follow Up  Pharmacy Consult for Zosyn  Indication: sepsis, UTI  No Known Allergies  Patient Measurements: Height:  (188 cm) Weight: 180 lb 1.9 oz (81.7 kg) IBW/kg (Calculated) : 82.2 Adjusted Body Weight:   Vital Signs: Temp: 97.4 F (36.3 C) (09/18 0559) Temp Source: Oral (09/18 0559) BP: 155/73 mmHg (09/18 0559) Pulse Rate: 114 (09/18 0900) Intake/Output from previous day: 09/17 0701 - 09/18 0700 In: 480 [P.O.:480] Out: 1351 [Urine:1350; Stool:1] Intake/Output from this shift: Total I/O In: 240 [P.O.:240] Out: -   Labs:  Recent Labs  10/12/14 0535 10/13/14 0556 10/14/14 0545  WBC 4.7 4.2 5.6  HGB 8.8* 8.5* 9.0*  PLT 379 371 418*  CREATININE 2.15* 1.98* 2.11*   Estimated Creatinine Clearance: 40.9 mL/min (by C-G formula based on Cr of 2.11). No results for input(s): VANCOTROUGH, VANCOPEAK, VANCORANDOM, GENTTROUGH, GENTPEAK, GENTRANDOM, TOBRATROUGH, TOBRAPEAK, TOBRARND, AMIKACINPEAK, AMIKACINTROU, AMIKACIN in the last 72 hours.   Microbiology: Recent Results (from the past 720 hour(s))  MRSA PCR Screening     Status: None   Collection Time: 09/30/14  1:00 AM  Result Value Ref Range Status   MRSA by PCR NEGATIVE NEGATIVE Final    Comment:        The GeneXpert MRSA Assay (FDA approved for NASAL specimens only), is one component of a comprehensive MRSA colonization surveillance program. It is not intended to diagnose MRSA infection nor to guide or monitor treatment for MRSA infections.   Blood Culture (routine x 2)     Status: None (Preliminary result)   Collection Time: 10/09/14  8:26 PM  Result Value Ref Range Status   Specimen Description BLOOD LEFT HAND  Final   Special Requests BOTTLES DRAWN AEROBIC AND ANAEROBIC 10CC  Final   Culture   Final    NO GROWTH 4 DAYS Performed at [facility]    Report Status PENDING  Incomplete  Blood Culture (routine x 2)     Status: None (Preliminary result)   Collection  Time: 10/09/14  8:26 PM  Result Value Ref Range Status   Specimen Description BLOOD LEFT FOREARM  Final   Special Requests   Final    BOTTLES DRAWN AEROBIC AND ANAEROBIC 5CC BLUE 10CC RED   Culture   Final    NO GROWTH 4 DAYS Performed at [facility]    Report Status PENDING  Incomplete  Urine culture     Status: None   Collection Time: 10/09/14 10:08 PM  Result Value Ref Range Status   Specimen Description URINE, CATHETERIZED  Final   Special Requests NONE  Final   Culture   Final    >=100,000 COLONIES/mL ESCHERICHIA COLI Performed at [facility]    Report Status 10/12/2014 FINAL  Final   Organism ID, Bacteria ESCHERICHIA COLI  Final      Susceptibility   Escherichia coli - MIC*    AMPICILLIN >=32 RESISTANT Resistant     CEFAZOLIN <=4 SENSITIVE Sensitive     CEFTRIAXONE <=1 SENSITIVE Sensitive     CIPROFLOXACIN <=0.25 SENSITIVE Sensitive     GENTAMICIN <=1 SENSITIVE Sensitive     IMIPENEM <=0.25 SENSITIVE Sensitive     NITROFURANTOIN <=16 SENSITIVE Sensitive     TRIMETH/SULFA <=20 SENSITIVE Sensitive     AMPICILLIN/SULBACTAM 16 INTERMEDIATE Intermediate     PIP/TAZO <=4 SENSITIVE Sensitive     * >=100,000 COLONIES/mL ESCHERICHIA COLI    Medical History: Past Medical History  Diagnosis Date  . Hypertension   .  Diabetes mellitus without complication   . Mental disorder   . Schizophrenia   . Aspiration pneumonia 08/21/2013  . Hip fracture 03/30/2012  . Periprosthetic hip fracture--nondisplaced, Left 08/05/2013  . Subcapital fracture of neck of femur 03/31/2012  . Mallory-Weiss tear 10/03/2014  . Hepatic steatosis 10/03/2014  . Ulcerative esophagitis 10/03/2014  . Renal cyst, right     Medications:  Anti-infectives    Start     Dose/Rate Route Frequency Ordered Stop   10/10/14 2200  vancomycin (VANCOCIN) IVPB 1000 mg/200 mL premix  Status:  Discontinued     1,000 mg 200 mL/hr over 60 Minutes Intravenous Daily at bedtime 10/10/14 0638 10/11/14 0841    10/10/14 0800  piperacillin-tazobactam (ZOSYN) IVPB 3.375 g     3.375 g 12.5 mL/hr over 240 Minutes Intravenous 3 times per day 10/10/14 0638     10/10/14 0000  vancomycin (VANCOCIN) IVPB 1000 mg/200 mL premix     1,000 mg 200 mL/hr over 60 Minutes Intravenous  Once 10/09/14 2357 10/10/14 0234   10/10/14 0000  piperacillin-tazobactam (ZOSYN) IVPB 3.375 g     3.375 g 100 mL/hr over 30 Minutes Intravenous  Once 10/09/14 2357 10/10/14 0134   10/09/14 2300  cefTRIAXone (ROCEPHIN) 1 g in dextrose 5 % 50 mL IVPB     1 g 100 mL/hr over 30 Minutes Intravenous  Once 10/09/14 2247 10/09/14 2344     Assessment: 64 yo patient with sepsis secondary to UTI started on Vanc/Zosyn per pharmacy on 9/14 now only on Zosyn as of 9/15 (Day 5). Fevers appear to have resolved. SCr up but stable with CrCl 41 and stable WBC. Urine culture positive for Ecoli resistant only to ampicillin and intermediate to Unasyn  Goal of Therapy:   Zosyn based on renal function   Plan:  Continue current Zosyn dosing.  Recommend changing to oral therapy when appropriate - ? Today   Hessie Knows, PharmD, BCPS Pager [phone] 10/14/2014 11:16 AM

## 2014-10-15 LAB — CBC
HCT: 27.6 % — ABNORMAL LOW (ref 39.0–52.0)
HEMOGLOBIN: 9.1 g/dL — AB (ref 13.0–17.0)
MCH: 29.4 pg (ref 26.0–34.0)
MCHC: 33 g/dL (ref 30.0–36.0)
MCV: 89 fL (ref 78.0–100.0)
Platelets: 530 10*3/uL — ABNORMAL HIGH (ref 150–400)
RBC: 3.1 MIL/uL — ABNORMAL LOW (ref 4.22–5.81)
RDW: 14.5 % (ref 11.5–15.5)
WBC: 7.4 10*3/uL (ref 4.0–10.5)

## 2014-10-15 LAB — BASIC METABOLIC PANEL
ANION GAP: 8 (ref 5–15)
BUN: 13 mg/dL (ref 6–20)
CALCIUM: 8.1 mg/dL — AB (ref 8.9–10.3)
CO2: 22 mmol/L (ref 22–32)
Chloride: 109 mmol/L (ref 101–111)
Creatinine, Ser: 1.79 mg/dL — ABNORMAL HIGH (ref 0.61–1.24)
GFR calc Af Amer: 44 mL/min — ABNORMAL LOW (ref >60)
GFR, EST NON AFRICAN AMERICAN: 38 mL/min — AB (ref >60)
GLUCOSE: 210 mg/dL — AB (ref 65–99)
Potassium: 4.2 mmol/L (ref 3.5–5.1)
SODIUM: 139 mmol/L (ref 135–145)

## 2014-10-15 MED ORDER — CEFUROXIME AXETIL 250 MG PO TABS: 250.0000 mg | ORAL_TABLET | Freq: Two times a day (BID) | ORAL | Status: DC

## 2014-10-15 MED ORDER — CLONAZEPAM 0.5 MG PO TABS: 0.2500 mg | ORAL_TABLET | ORAL | Status: AC

## 2014-10-15 MED ORDER — AMLODIPINE BESYLATE 10 MG PO TABS
10.0000 mg | ORAL_TABLET | Freq: Every day | ORAL | Status: DC
  Administered 2014-10-15: 10 mg via ORAL
  Filled 2014-10-15: qty 1

## 2014-10-15 MED ORDER — MORPHINE SULFATE ER 15 MG PO TBCR: 15.0000 mg | EXTENDED_RELEASE_TABLET | Freq: Two times a day (BID) | ORAL | Status: DC | PRN

## 2014-10-15 MED ORDER — CEFUROXIME AXETIL 250 MG PO TABS
250.0000 mg | ORAL_TABLET | Freq: Two times a day (BID) | ORAL | Status: DC
  Administered 2014-10-15: 250 mg via ORAL
  Filled 2014-10-15 (×3): qty 1

## 2014-10-15 NOTE — Care Management Note (Signed)
Case Management Note  Patient Details  Name: David Lang MRN: [id number] Date [name]: [birth year]  Subjective/Objective:         64 yo admitted with Sepsis/UTI          Action/Plan: Pt is from [facility], [facility] UNIT   Expected Discharge Date:   (UNKNOWN)               Expected Discharge Plan:  Assisted Living / Rest Home  In-House Referral:  Clinical Social Work  Discharge planning Services  CM Consult  Post Acute Care Choice:  NA Choice offered to:  NA  DME Arranged:    DME Agency:     HH Arranged:  PT, OT HH Agency:     Status of Service:  Completed, signed off  Medicare Important Message Given:    Date Medicare IM Given:    Medicare IM give by:    Date Additional Medicare IM Given:    Additional Medicare Important Message give by:     If discussed at Long Length of Stay Meetings, dates discussed:    Additional Comments: PT is recommending HHPT. Pt is from [facility], [facility] UNIT and will DC there. Facility has an in house PT/OT clinic called [facility] for their pt's. PT/OT orders faxed to the Wellness office at the facility. No other CM needs noted. Bartholome Bill, RN 10/15/2014, 12:16 PM

## 2014-10-15 NOTE — Progress Notes (Signed)
Patient to be transported via [facility] to [facility] at 4:00pm. RN provided report to facility representatives via face to face interview. Patient informed of transport. No further services reported at this time. CSW to sign off. Please consult if further social work services arise.   Fernande Boyden, [facility] Clinical Social Worker [place] [facility] [phone]

## 2014-10-15 NOTE — Discharge Summary (Signed)
Physician Discharge Summary  David Lang ZOX:[id number] DOB: 12/04/1950 DOA: 10/09/2014  PCP: Florentina Jenny, MD  Admit date: 10/09/2014 Discharge date: 10/15/2014  Time spent: 35 minutes  Recommendations for Outpatient Follow-up:  1. Please follow up on blood pressures, he is on multiple antihypertensive agents, held on this hospitalization due to hypotension on presentation.     Discharge Diagnoses:  Principal Problem:   Sepsis secondary to UTI Active Problems:   Hypertension   Schizophrenia, paranoid type   DM type 2 causing CKD stage 3   Chronic anemia   Acute renal failure superimposed on stage 3 chronic kidney disease   Lactic acidosis   UTI (lower urinary tract infection)   Discharge Condition: Stable  Diet recommendation: Heart Healthy  Filed Weights   10/10/14 0539  Weight: 81.7 kg (180 lb 1.9 oz)    History of present illness:  64 year old male with a history of hypertension diabetes, schizophrenia, recent admission for hematemesis secondary to Mallory-Weiss tear and mid ulcerative esophagitis, status post EGD on 9/4, status post 2 units of packed red blood cells, discharged on 9/7, who presents to the ER with several episodes of diarrhea for the last 3 days. Patient does not provide any meaningful history and most history is obtained from the ED records and notes from nursing home. Patient is a resident of [facility].staff reported that he had a near syncopal episode earlier today. He normally ambulates with a walker and is assisted.  In the ER the patient was found to be hypotensive, tachycardic, febrile with a fever of 103.1. Chest x-ray was negative, white blood cell count of 27.6, creatinine has increased from 1.67>3.19. Lactic acid 1.18. UA grossly positive . Patient has received a total of 2 L of fluid boluses and remains hypotensive in the 90s.  Hospital Course:  Patient is a 64 year old gentleman with multiple comorbidities including diabetes mellitus,  hypertension, recent hospitalization for upper GI bleed status post upper endoscopy on 09/30/2014, discharged from the medicine service on 10/03/2014 presented to the emergency department with mental status changes. He presented as a transfer from his facility. On admission his found to be septic having a white count of 27,600, acute renal failure, temperature of 103, with source of infection likely to be urinary tract. He was started on broad-spectrum IV antimicrobial therapy with vancomycin and Zosyn.   Sepsis -Present on admission, evidenced by a white count of 27,600, development of acute on chronic renal failure, temperature 103 with source of infection likely to be urinary tract infection. Urine appeared turbid with UA showing the presence of many bacteria, and large amount of leukocytes. -Patient recently hospitalized, foley catheter may have been utilized on recent hospitalization -Urine blood cultures growing E.coli organism resistant to ampicillin with intermediate susceptibility to ampicillin/sulbactam. It was susceptible to Zosyn, nitrofurantoin, ciprofloxacin, ceftriaxone and Cefazolin.  -He was transitioned to Ceftin 250 mg PO BID on 10/15/2014  2. Acute on chronic renal failure. -Creatinine trending down from 3.17-2.44 after receiving IV fluid resuscitation. His renal failure was likely secondary to ATN in setting of hypotension and probably prerenal azothemia/hypovolemia from decreased by mouth intake. - He has a history of stage III/IV chronic kidney disease having baseline creatinine near 2.0. -A.m. Labs on 10/14/2014 showing creatinine of 2.1.  3. Acute encephalopathy/delirium. -Patient presenting with delirium likely secondary to infectious process. He was noted to have an acute change with inattention and mental status changes. -Improved, he was conversive on the day of discharge, appearing to be functioning at  his baseline level.   4. History of GI bleed. -He was recently  hospitalized undergoing upper endoscopy on 09/30/2014 that revealed a Mallory-Weiss tear and severe distal and mid ulcerated esophagitis. -Continue proton pump inhibitor therapy.  5. History of type 2 diabetes mellitus -His diet was advanced, blood sugar stable.  6. History of hypertension -Anti-hypertensive agents held due to the presentation of hypotension and sepsis -He is on multiple antihypertensive agents please follow up on his blood pressures closely.   7. Diarrhea -Patient having formed stools this morning. Unlikely to have C. difficile colitis  8. Urinary tract infection -Urine cultures positive for Ecoli, organism susceptible to Zosyn. -He was treated with 5 days of IV Zosyn. Transitioned to Ceftin    Consultations:  PT  Discharge Exam: Filed Vitals:   10/15/14 1030  BP: 168/77  Pulse: 78  Temp:   Resp:      General: Patient is more awake today, talkative, no distress, following commands. He appears to be functioning at his baseline.   Cardiovascular: regular rate rhythm normal S1-S2  Respiratory: Normal respiratory effort lungs clear to auscultation  Abdomen: Soft nontender nondistended  Musculoskeletal: No edema   Discharge Instructions   Discharge Instructions    Call MD for:  difficulty breathing, headache or visual disturbances    Complete by:  As directed      Call MD for:  extreme fatigue    Complete by:  As directed      Call MD for:  hives    Complete by:  As directed      Call MD for:  persistant dizziness or light-headedness    Complete by:  As directed      Call MD for:  persistant nausea and vomiting    Complete by:  As directed      Call MD for:  redness, tenderness, or signs of infection (pain, swelling, redness, odor or green/yellow discharge around incision site)    Complete by:  As directed      Call MD for:  severe uncontrolled pain    Complete by:  As directed      Call MD for:  temperature >100.4    Complete by:  As  directed      Call MD for:    Complete by:  As directed      Diet - low sodium heart healthy    Complete by:  As directed      Increase activity slowly    Complete by:  As directed           Current Discharge Medication List    START taking these medications   Details  cefUROXime (CEFTIN) 250 MG tablet Take 1 tablet (250 mg total) by mouth 2 (two) times daily with a meal. Qty: 8 tablet, Refills: 0      CONTINUE these medications which have CHANGED   Details  clonazePAM (KLONOPIN) 0.5 MG tablet Take 0.5 tablets (0.25 mg total) by mouth as directed. Take 0.25 mg scheduled once every evening, and may also take 0.25mg  once in the morning as needed for anxiety. Qty: 30 tablet, Refills: 0    morphine (MS CONTIN) 15 MG 12 hr tablet Take 1 tablet (15 mg total) by mouth every 12 (twelve) hours as needed for pain. Qty: 30 tablet, Refills: 0      CONTINUE these medications which have NOT CHANGED   Details  amLODipine (NORVASC) 10 MG tablet Take 1 tablet (10 mg total) by mouth daily.  atenolol (TENORMIN) 100 MG tablet Take 1 tablet (100 mg total) by mouth daily.    cetirizine (ZYRTEC) 10 MG tablet Take 10 mg by mouth daily as needed for allergies.     cloNIDine (CATAPRES - DOSED IN MG/24 HR) 0.3 mg/24hr Place 1 patch (0.3 mg total) onto the skin once a week. Qty: 4 patch    haloperidol (HALDOL) 5 MG tablet Take 2.5-5 mg by mouth 2 (two) times daily. Take 2.5mg  in the morning and then take 5mg  daily at night    hydrALAZINE (APRESOLINE) 50 MG tablet Take 75 mg by mouth 3 (three) times daily.     lisinopril (PRINIVIL,ZESTRIL) 20 MG tablet Take 20 mg by mouth daily.    loperamide (IMODIUM) 2 MG capsule Take 2 mg by mouth 3 (three) times daily as needed for diarrhea or loose stools.    OLANZapine (ZYPREXA) 5 MG tablet Take 5 mg by mouth 2 (two) times daily. And may take 1 additional dose of 5mg  as needed for pychosis    ondansetron (ZOFRAN) 4 MG tablet Take 1 tablet (4 mg total) by  mouth every 6 (six) hours as needed for nausea. Qty: 20 tablet    OYSTER SHELL CALCIUM PO Take 500 mg by mouth daily.     pantoprazole (PROTONIX) 40 MG tablet Take 1 tablet (40 mg total) by mouth 2 (two) times daily. Qty: 60 tablet, Refills: 2    spironolactone (ALDACTONE) 25 MG tablet Take 12.5 mg by mouth daily.    tamsulosin (FLOMAX) 0.4 MG CAPS capsule Take 0.4 mg by mouth.    trihexyphenidyl (ARTANE) 5 MG tablet Take 2.5 mg by mouth daily.     vitamin B-12 1000 MCG tablet Take 1 tablet (1,000 mcg total) by mouth daily.      STOP taking these medications     haloperidol decanoate (HALDOL DECANOATE) 100 MG/ML injection        No Known Allergies    The results of significant diagnostics from this hospitalization (including imaging, microbiology, ancillary and laboratory) are listed below for reference.    Significant Diagnostic Studies: Ct Abdomen Pelvis Wo Contrast  10/10/2014   CLINICAL DATA:  64 year old male with UTI.  EXAM: CT ABDOMEN AND PELVIS WITHOUT CONTRAST  TECHNIQUE: Multidetector CT imaging of the abdomen and pelvis was performed following the standard protocol without IV contrast.  COMPARISON:  Ultrasound dated 09/30/2014 and CT of the pelvis dated 08/05/2013  FINDINGS: Evaluation of this exam is limited in the absence of intravenous contrast. Evaluation is also limited due to respiratory motion artifact.  Partially visualized small bilateral pleural effusions. The visualized lung bases are otherwise clear. There is coronary vascular calcification. A small pericardial effusion measuring up to 9 mm in transverse dimensions noted. There is hypoattenuation of the cardiac blood pool compatible with anemia.  No intra-abdominal free air or free fluid.  Stop  The liver, gallbladder, pancreas, spleen, and adrenal glands appear unremarkable. There is no hydronephrosis or nephrolithiasis. Multiple bilateral renal hypodense lesions measuring up to 7 cm in the upper pole of the  right kidney. The largest lesion represents a cyst. Smaller lesions are not well characterized. Ultrasound may provide better evaluation. There is diffuse thickening of the urinary bladder most compatible with cystitis. Correlation with urinalysis recommended. A small pocket of air in the anterior bladder may be related to recent instrumentation. Clinical correlation is recommended. The prostate gland is mildly enlarged.  Loose stool noted throughout the colon compatible with diarrheal state. Correlation with clinical exam  and stool cultures recommended. There is diffuse thickening of the wall of the stomach which may be related to underdistention. Gastritis is not excluded. Clinical correlation is recommended. No evidence of bowel obstruction. Normal appendix.  There is aortoiliac atherosclerotic disease. No portal venous gas identified. There is no adenopathy. Osteopenia with degenerative changes of the spine. Old healed right eleventh rib fracture. No acute fracture. Total left hip arthroplasty.  IMPRESSION: Diffuse bladder wall thickening most compatible with cystitis. Correlation with urinalysis recommended.  There is no hydronephrosis or nephrolithiasis. Probable bilateral renal cysts. Ultrasound may provide better evaluation.  Diarrheal state. Correlation with clinical exam and stool cultures recommended. No bowel obstruction. Normal appendix.   Electronically Signed   By: Elgie Collard M.D.   On: 10/10/2014 01:43   Dg Chest 2 View  09/29/2014   CLINICAL DATA:  Hematemesis.  EXAM: CHEST  2 VIEW  COMPARISON:  08/19/2013  FINDINGS: Cardiac enlargement unchanged. Negative for heart failure. Left lower lobe atelectasis.  Elevated right hemidiaphragm with mild right lower lobe atelectasis or scarring, unchanged from the prior study.  Negative for pneumonia or mass lesion.  IMPRESSION: Cardiac enlargement with mild left lower lobe atelectasis.  Elevated right hemidiaphragm with mild right lower lobe chronic  atelectasis or scarring.   Electronically Signed   By: Marlan Palau M.D.   On: 09/29/2014 21:52   Dg Chest Port 1 View  10/10/2014   CLINICAL DATA:  Shortness of breath.  EXAM: PORTABLE CHEST - 1 VIEW  COMPARISON:  Radiographs obtained yesterday at 1948 hour.  FINDINGS: Lung volumes are low. The cardiomediastinal contours are unchanged allowing for differences in technique. Decreased atelectasis at the left lung base, with minimal increase in right basilar atelectasis. No pulmonary edema. No consolidation, pleural effusion, or pneumothorax. No acute osseous abnormalities are seen.  IMPRESSION: Shifting atelectasis at the lung bases, improved on the left but worsening on the right.   Electronically Signed   By: Rubye Oaks M.D.   On: 10/10/2014 05:21   Dg Chest Port 1 View  10/09/2014   CLINICAL DATA:  64 year old male with fever  EXAM: PORTABLE CHEST - 1 VIEW  COMPARISON:  Chest radiograph dated 09/29/2014  FINDINGS: Single-view of the chest demonstrate minimal atelectatic changes of the left lung base. There is no focal consolidation, pleural effusion, or pneumothorax. Stable cardiac silhouette. The osseous structures are grossly unremarkable.  IMPRESSION: Left lung base subsegmental atelectasis.  No focal consolidation.   Electronically Signed   By: Elgie Collard M.D.   On: 10/09/2014 20:13   US Abdomen Limited Ruq  09/30/2014   CLINICAL DATA:  Elevated ammonia. History of hypertension and diabetes.  EXAM: US ABDOMEN LIMITED - RIGHT UPPER QUADRANT  COMPARISON:  None.  FINDINGS: Gallbladder:  No gallstones or wall thickening visualized. No sonographic Murphy sign noted.  Common bile duct:  Diameter: 3.7 mm  Liver:  Mildly echogenic.  Two right renal cysts are noted. The largest is arising from the upper pole, measuring 6.5 cm in maximum diameter. There is also a small right pleural effusion.  IMPRESSION: 1. Mildly echogenic liver, most likely due to steatosis. 2. Small right pleural effusion. 3.  Right renal cysts.   Electronically Signed   By: Beckie Salts M.D.   On: 09/30/2014 12:37    Microbiology: Recent Results (from the past 240 hour(s))  Blood Culture (routine x 2)     Status: None   Collection Time: 10/09/14  8:26 PM  Result Value Ref Range Status  Specimen Description BLOOD LEFT HAND  Final   Special Requests BOTTLES DRAWN AEROBIC AND ANAEROBIC 10CC  Final   Culture   Final    NO GROWTH 5 DAYS Performed at [facility]    Report Status 10/14/2014 FINAL  Final  Blood Culture (routine x 2)     Status: None   Collection Time: 10/09/14  8:26 PM  Result Value Ref Range Status   Specimen Description BLOOD LEFT FOREARM  Final   Special Requests   Final    BOTTLES DRAWN AEROBIC AND ANAEROBIC 5CC BLUE 10CC RED   Culture   Final    NO GROWTH 5 DAYS Performed at [facility]    Report Status 10/14/2014 FINAL  Final  Urine culture     Status: None   Collection Time: 10/09/14 10:08 PM  Result Value Ref Range Status   Specimen Description URINE, CATHETERIZED  Final   Special Requests NONE  Final   Culture   Final    >=100,000 COLONIES/mL ESCHERICHIA COLI Performed at [facility]    Report Status 10/12/2014 FINAL  Final   Organism ID, Bacteria ESCHERICHIA COLI  Final      Susceptibility   Escherichia coli - MIC*    AMPICILLIN >=32 RESISTANT Resistant     CEFAZOLIN <=4 SENSITIVE Sensitive     CEFTRIAXONE <=1 SENSITIVE Sensitive     CIPROFLOXACIN <=0.25 SENSITIVE Sensitive     GENTAMICIN <=1 SENSITIVE Sensitive     IMIPENEM <=0.25 SENSITIVE Sensitive     NITROFURANTOIN <=16 SENSITIVE Sensitive     TRIMETH/SULFA <=20 SENSITIVE Sensitive     AMPICILLIN/SULBACTAM 16 INTERMEDIATE Intermediate     PIP/TAZO <=4 SENSITIVE Sensitive     * >=100,000 COLONIES/mL ESCHERICHIA COLI     Labs: Basic Metabolic Panel:  Recent Labs Lab 10/09/14 1850 10/10/14 0132 10/11/14 0340 10/12/14 0535 10/13/14 0556 10/14/14 0545  NA 134*  --  136 139 140 141   K 4.2  --  4.2 3.7 3.7 3.9  CL 104  --  109 111 112* 110  CO2 19*  --  17* 19* 20* 22  GLUCOSE 175*  --  88 100* 166* 159*  BUN 41*  --  29* 21* 18 15  CREATININE 3.19* 3.17* 2.44* 2.15* 1.98* 2.11*  CALCIUM 8.4*  --  7.8* 8.0* 7.9* 8.2*  MG  --  1.6*  --   --   --   --    Liver Function Tests:  Recent Labs Lab 10/09/14 1850  AST 14*  ALT 8*  ALKPHOS 46  BILITOT 0.7  PROT 6.5  ALBUMIN 3.1*   No results for input(s): LIPASE, AMYLASE in the last 168 hours.  Recent Labs Lab 10/09/14 1850  AMMONIA 40*   CBC:  Recent Labs Lab 10/09/14 1850 10/10/14 0132 10/11/14 0340 10/12/14 0535 10/13/14 0556 10/14/14 0545  WBC 27.6* 24.7* 14.9* 4.7 4.2 5.6  NEUTROABS 23.2*  --   --   --   --   --   HGB 8.5* 8.4* 9.0* 8.8* 8.5* 9.0*  HCT 25.3* 25.0* 27.8* 26.9* 26.5* 27.3*  MCV 87.8 90.3 88.8 87.1 87.7 87.8  PLT 415* 385 376 379 371 418*   Cardiac Enzymes:  Recent Labs Lab 10/10/14 0132 10/10/14 0745 10/10/14 1335  TROPONINI <0.03 <0.03 0.05*   BNP: BNP (last 3 results) No results for input(s): BNP in the last 8760 hours.  ProBNP (last 3 results) No results for input(s): PROBNP in the last 8760 hours.  CBG: No results for input(s): GLUCAP in the last 168 hours.     SignedJeralyn Bennett  [facility] 10/15/2014, 12:42 PM

## 2014-10-15 NOTE — Progress Notes (Signed)
Physical Therapy Treatment Patient Details Name: David Lang MRN: [id number] DOB: 02/14/1950 Today's Date: 10-22-2014    History of Present Illness 64 year old male with a history of hypertension diabetes, schizophrenia, recent admission for hematemesis secondary to Mallory-Weiss tear and mid ulcerative esophagitis, status post EGD on 9/4, status post 2 units of packed red blood cells, discharged on 9/7, who presents to the ER with several episodes of diarrhea. Dx of sepsis 2* UTI.    PT Comments    Pt participates with exercises only, with encouragement; he refuses to get OOB but was up to chair with nursing earlier; will follow  Follow Up Recommendations  Supervision for mobility/OOB;Home health PT     Equipment Recommendations  None recommended by PT    Recommendations for Other Services       Precautions / Restrictions Precautions Precautions: Fall Restrictions Weight Bearing Restrictions: No    Mobility  Bed Mobility               General bed mobility comments: pt refuses OOB with PT, per NT he did get up to chair earlier  Transfers                    Ambulation/Gait                 Stairs            Wheelchair Mobility    Modified Rankin (Stroke Patients Only)       Balance                                    Cognition Arousal/Alertness: Awake/alert Behavior During Therapy: Flat affect Overall Cognitive Status: No family/caregiver present to determine baseline cognitive functioning                      Exercises General Exercises - Lower Extremity Ankle Circles/Pumps: AROM;Both;15 reps;Supine Short Arc Quad: AROM;AAROM;Both;15 reps Heel Slides: AROM;AAROM;Both;15 reps;Supine Hip ABduction/ADduction: AROM;AAROM;Strengthening;Both;15 reps;Supine  Requires tactile input throughout to continue ther-ex    General Comments        Pertinent Vitals/Pain Pain Assessment: No/denies pain    Home  Living                      Prior Function            PT Goals (current goals can now be found in the care plan section) Acute Rehab PT Goals Patient Stated Goal: pt stated he just needs rest PT Goal Formulation: With patient Time For Goal Achievement: 10/28/14 Potential to Achieve Goals: Good Progress towards PT goals: Progressing toward goals    Frequency  Min 3X/week    PT Plan Current plan remains appropriate    Co-evaluation             End of Session   Activity Tolerance: Patient limited by fatigue Patient left: in bed;with call bell/phone within reach;with bed alarm set     Time: 1205-1221 PT Time Calculation (min) (ACUTE ONLY): 16 min  Charges:  $Therapeutic Exercise: 8-22 mins                    G Codes:      [facility] 10-22-14, 12:37 PM

## 2014-10-15 NOTE — Progress Notes (Signed)
Pt discharged to [facility] via ambulance transport. Rn form [facility] was here at the hospital today. Report was given to his Nurse.  Called Dianne patients friend to let her know that patient was being discharged and that he wanted [place] to let her know.

## 2014-10-15 NOTE — Clinical Social Work Placement (Signed)
   CLINICAL SOCIAL WORK PLACEMENT  NOTE  Date:  10/15/2014  Patient Details  Name: David Lang MRN: [id number] Date of Birth: [birth year]  Clinical Social Work is seeking post-discharge placement for this patient at the Assisted Living Facility level of care (*CSW will initial, date and re-position this form in  chart as items are completed):  Yes   Patient/family provided with [facility] Clinical Social Work Department's list of facilities offering this level of care within the geographic area requested by the patient (or if unable, by the patient's family).  Yes   Patient/family informed of their freedom to choose among providers that offer the needed level of care, that participate in Medicare, Medicaid or managed care program needed by the patient, have an available bed and are willing to accept the patient.  Yes   Patient/family informed of 's ownership interest in [facility] and [facility], as well as of the fact that they are under no obligation to receive care at these facilities.  PASRR submitted to EDS on       PASRR number received on       Existing PASRR number confirmed on       FL2 transmitted to all facilities in geographic area requested by pt/family on       FL2 transmitted to all facilities within larger geographic area on       Patient informed that his/her managed care company has contracts with or will negotiate with certain facilities, including the following:         (N/A)   Patient/family informed of bed offers received.  Patient chooses bed at  [facility] )     Physician recommends and patient chooses bed at      Patient to be transferred to  [facility]) on 10/15/14.  Patient to be transferred to facility by [facility]     Patient family notified on 10/15/14 of transfer.  Name of family member notified:        PHYSICIAN       Additional Comment:    _______________________________________________ Loleta Dicker,  LCSW 10/15/2014, 3:33 PM

## 2014-11-14 ENCOUNTER — Emergency Department (HOSPITAL_COMMUNITY)
Admission: EM | Admit: 2014-11-14 | Discharge: 2014-11-14 | Disposition: A | Discharge status: Home / Self Care | Payer: BLUE CROSS/BLUE SHIELD | Attending: Emergency Medicine | Admitting: Emergency Medicine

## 2014-11-14 ENCOUNTER — Encounter (HOSPITAL_COMMUNITY): Payer: Self-pay

## 2014-11-14 ENCOUNTER — Emergency Department (HOSPITAL_COMMUNITY): Payer: BLUE CROSS/BLUE SHIELD

## 2014-11-14 DIAGNOSIS — Z8701 Personal history of pneumonia (recurrent): Secondary | ICD-10-CM | POA: Diagnosis not present

## 2014-11-14 DIAGNOSIS — Y92129 Unspecified place in nursing home as the place of occurrence of the external cause: Secondary | ICD-10-CM | POA: Insufficient documentation

## 2014-11-14 DIAGNOSIS — S0101XA Laceration without foreign body of scalp, initial encounter: Secondary | ICD-10-CM | POA: Diagnosis not present

## 2014-11-14 DIAGNOSIS — Y998 Other external cause status: Secondary | ICD-10-CM | POA: Diagnosis not present

## 2014-11-14 DIAGNOSIS — Z79899 Other long term (current) drug therapy: Secondary | ICD-10-CM | POA: Diagnosis not present

## 2014-11-14 DIAGNOSIS — Z8719 Personal history of other diseases of the digestive system: Secondary | ICD-10-CM | POA: Diagnosis not present

## 2014-11-14 DIAGNOSIS — E119 Type 2 diabetes mellitus without complications: Secondary | ICD-10-CM | POA: Diagnosis not present

## 2014-11-14 DIAGNOSIS — F209 Schizophrenia, unspecified: Secondary | ICD-10-CM | POA: Diagnosis not present

## 2014-11-14 DIAGNOSIS — I10 Essential (primary) hypertension: Secondary | ICD-10-CM | POA: Insufficient documentation

## 2014-11-14 DIAGNOSIS — Q6100 Congenital renal cyst, unspecified: Secondary | ICD-10-CM | POA: Diagnosis not present

## 2014-11-14 DIAGNOSIS — S0990XA Unspecified injury of head, initial encounter: Secondary | ICD-10-CM | POA: Diagnosis present

## 2014-11-14 DIAGNOSIS — Y9389 Activity, other specified: Secondary | ICD-10-CM | POA: Diagnosis not present

## 2014-11-14 DIAGNOSIS — W01198A Fall on same level from slipping, tripping and stumbling with subsequent striking against other object, initial encounter: Secondary | ICD-10-CM | POA: Diagnosis not present

## 2014-11-14 DIAGNOSIS — S0191XA Laceration without foreign body of unspecified part of head, initial encounter: Secondary | ICD-10-CM

## 2014-11-14 NOTE — Discharge Instructions (Signed)
Head Injury, Adult °You have a head injury. Headaches and throwing up (vomiting) are common after a head injury. It should be easy to wake up from sleeping. Sometimes you must stay in the hospital. Most problems happen within the first 24 hours. Side effects may occur up to 7-10 days after the injury.  °WHAT ARE THE TYPES OF HEAD INJURIES? °Head injuries can be as minor as a bump. Some head injuries can be more severe. More severe head injuries include: °· A jarring injury to the brain (concussion). °· A bruise of the brain (contusion). This mean there is bleeding in the brain that can cause swelling. °· A cracked skull (skull fracture). °· Bleeding in the brain that collects, clots, and forms a bump (hematoma). °WHEN SHOULD I GET HELP RIGHT AWAY?  °· You are confused or sleepy. °· You cannot be woken up. °· You feel sick to your stomach (nauseous) or keep throwing up (vomiting). °· Your dizziness or unsteadiness is getting worse. °· You have very bad, lasting headaches that are not helped by medicine. Take medicines only as told by your doctor. °· You cannot use your arms or legs like normal. °· You cannot walk. °· You notice changes in the black spots in the center of the colored part of your eye (pupil). °· You have clear or bloody fluid coming from your nose or ears. °· You have trouble seeing. °During the next 24 hours after the injury, you must stay with someone who can watch you. This person should get help right away (call 911 in the [place] if you start to shake and are not able to control it (have seizures), you pass out, or you are unable to wake up. °HOW CAN I PREVENT A HEAD INJURY IN THE FUTURE? °· Wear seat belts. °· Wear a helmet while bike riding and playing sports like football. °· Stay away from dangerous activities around the house. °WHEN CAN I RETURN TO NORMAL ACTIVITIES AND ATHLETICS? °See your doctor before doing these activities. You should not do normal activities or play contact sports until 1  week after the following symptoms have stopped: °· Headache that does not go away. °· Dizziness. °· Poor attention. °· Confusion. °· Memory problems. °· Sickness to your stomach or throwing up. °· Tiredness. °· Fussiness. °· Bothered by bright lights or loud noises. °· Anxiousness or depression. °· Restless sleep. °MAKE SURE YOU:  °· Understand these instructions. °· Will watch your condition. °· Will get help right away if you are not doing well or get worse. °  °This information is not intended to replace advice given to you by your health care provider. Make sure you discuss any questions you have with your health care provider. °  °Document Released: 12/26/2007 Document Revised: 02/02/2014 Document Reviewed: 09/19/2012 °Elsevier Interactive Patient Education ©2016 [employer] ° °

## 2014-11-14 NOTE — ED Provider Notes (Signed)
CSN: [id number]     Arrival date & time 11/14/14  1114 History   First MD Initiated Contact with Patient 11/14/14 1146     Chief Complaint  Patient presents with  . Fall     (Consider location/radiation/quality/duration/timing/severity/associated sxs/prior Treatment) Patient is a 64 y.o. male presenting with fall. The history is provided by the patient.  Fall This is a new problem. The current episode started less than 1 hour ago. The problem occurs constantly. The problem has been resolved. Associated symptoms include headaches. Pertinent negatives include no chest pain, no abdominal pain and no shortness of breath. Nothing aggravates the symptoms. Nothing relieves the symptoms. He has tried nothing for the symptoms. The treatment provided no relief.   64 yo M with a chief complaint of a fall. Says this was mechanical. Lare[place] Seat and hit the back of his head. Patient denies loss of consciousness. Denies neck pain back pain chest pain abdominal pain.  Past Medical History  Diagnosis Date  . Hypertension   . Diabetes mellitus without complication (HCC)   . Mental disorder   . Schizophrenia (HCC)   . Aspiration pneumonia (HCC) 08/21/2013  . Hip fracture (HCC) 03/30/2012  . Periprosthetic hip fracture--nondisplaced, Left 08/05/2013  . Subcapital fracture of neck of femur (HCC) 03/31/2012  . Mallory-Weiss tear 10/03/2014  . Hepatic steatosis 10/03/2014  . Ulcerative esophagitis 10/03/2014  . Renal cyst, right    Past Surgical History  Procedure Laterality Date  . Hip arthroplasty Left 03/31/2012    Procedure: ARTHROPLASTY BIPOLAR HIP;  Surgeon: Mable Paris, MD;  Location: [facility];  Service: Orthopedics;  Laterality: Left;  . Esophagogastroduodenoscopy (egd) with propofol N/A 09/30/2014    Procedure: ESOPHAGOGASTRODUODENOSCOPY (EGD) WITH PROPOFOL;  Surgeon: Vida Rigger, MD;  Location: [facility];  Service: Endoscopy;  Laterality: N/A;  . Joint replacement     Family History  Problem  Relation Age of Onset  . Diabetes type II Mother   . Hypertension Father    Social History  Substance Use Topics  . Smoking status: Never Smoker   . Smokeless tobacco: Never Used  . Alcohol Use: No     Comment: not answering    Review of Systems  Constitutional: Negative for fever and chills.  HENT: Negative for congestion and facial swelling.   Eyes: Negative for discharge and visual disturbance.  Respiratory: Negative for shortness of breath.   Cardiovascular: Negative for chest pain and palpitations.  Gastrointestinal: Negative for vomiting, abdominal pain and diarrhea.  Musculoskeletal: Negative for myalgias and arthralgias.  Skin: Negative for color change and rash.  Neurological: Positive for headaches. Negative for tremors and syncope.  Psychiatric/Behavioral: Negative for confusion and dysphoric mood.      Allergies  Review of patient's allergies indicates no known allergies.  Home Medications   Prior to Admission medications   Medication Sig Start Date End Date Taking? Authorizing Provider  atenolol (TENORMIN) 50 MG tablet Take 50 mg by mouth daily.   Yes Historical Provider, MD  clonazePAM (KLONOPIN) 0.5 MG tablet Take 0.5 tablets (0.25 mg total) by mouth as directed. Take 0.25 mg scheduled once every evening, and may also take 0.25mg  once in the morning as needed for anxiety. Patient taking differently: Take 0.25 mg by mouth every evening. Take 0.25 mg scheduled once every evening, and may also take 0.25 mg in the morning as needed for anxiety 10/15/14  Yes Jeralyn Bennett, MD  haloperidol decanoate (HALDOL DECANOATE) 100 MG/ML injection Inject 100 mg into the muscle every  28 (twenty-eight) days.   Yes Historical Provider, MD  hydrALAZINE (APRESOLINE) 50 MG tablet Take 25 mg by mouth 3 (three) times daily.    Yes Historical Provider, MD  lisinopril (PRINIVIL,ZESTRIL) 20 MG tablet Take 20 mg by mouth daily.   Yes Historical Provider, MD  loperamide (IMODIUM) 2 MG  capsule Take 2 mg by mouth 3 (three) times daily as needed for diarrhea or loose stools.   Yes Historical Provider, MD  morphine (MS CONTIN) 15 MG 12 hr tablet Take 1 tablet (15 mg total) by mouth every 12 (twelve) hours as needed for pain. 10/15/14  Yes Jeralyn Bennett, MD  OLANZapine (ZYPREXA) 5 MG tablet Take 5 mg by mouth 2 (two) times daily. And may take 1 additional dose of  as needed for pychosis   Yes Historical Provider, MD  ondansetron (ZOFRAN) 4 MG tablet Take 1 tablet (4 mg total) by mouth every 6 (six) hours as needed for nausea. 04/18/12  Yes Adeline C Viyuoh, MD  pantoprazole (PROTONIX) 40 MG tablet Take 1 tablet (40 mg total) by mouth 2 (two) times daily. 10/03/14  Yes Maryruth Bun Rama, MD  spironolactone (ALDACTONE) 25 MG tablet Take 12.5 mg by mouth daily.   Yes Historical Provider, MD  tamsulosin (FLOMAX) 0.4 MG CAPS capsule Take 0.4 mg by mouth.   Yes Historical Provider, MD  trihexyphenidyl (ARTANE) 5 MG tablet Take 2.5 mg by mouth daily.    Yes Historical Provider, MD  vitamin B-12 1000 MCG tablet Take 1 tablet (1,000 mcg total) by mouth daily. 04/18/12  Yes Adeline Joselyn Glassman, MD  amLODipine (NORVASC) 10 MG tablet Take 1 tablet (10 mg total) by mouth daily. 04/18/12   Kela Millin, MD  atenolol (TENORMIN) 100 MG tablet Take 1 tablet (100 mg total) by mouth daily. 04/18/12   Kela Millin, MD  cefUROXime (CEFTIN) 250 MG tablet Take 1 tablet (250 mg total) by mouth 2 (two) times daily with a meal. Patient not taking: Reported on 11/14/2014 10/15/14   Jeralyn Bennett, MD  cloNIDine (CATAPRES - DOSED IN MG/24 HR) 0.3 mg/24hr Place 1 patch (0.3 mg total) onto the skin once a week. Patient not taking: Reported on 11/14/2014 04/18/12   Kela Millin, MD  haloperidol (HALDOL) 0.5 MG tablet Take 0.5 mg by mouth every morning.    Historical Provider, MD  haloperidol (HALDOL) 5 MG tablet Take 5 mg by mouth at bedtime. Take 2.5mg  in the morning and then take  daily at night     Historical Provider, MD   BP 119/63 mmHg  Pulse 73  Temp(Src) 98.1 F (36.7 C) (Oral)  Resp 20  SpO2 96% Physical Exam  Constitutional: He is oriented to person, place, and time. He appears well-developed and well-nourished.  HENT:  Head: Normocephalic and atraumatic.    Eyes: EOM are normal. Pupils are equal, round, and reactive to light.  Neck: Normal range of motion. Neck supple. No JVD present.  Cardiovascular: Normal rate and regular rhythm.  Exam reveals no gallop and no friction rub.   No murmur heard. Pulmonary/Chest: No respiratory distress. He has no wheezes.  Abdominal: He exhibits no distension. There is no rebound and no guarding.  Musculoskeletal: Normal range of motion. He exhibits no edema or tenderness.  Neurological: He is alert and oriented to person, place, and time.  Skin: No rash noted. No pallor.  Psychiatric: He has a normal mood and affect. His behavior is normal.  Nursing note and vitals reviewed.  ED Course  Procedures (including critical care time) Labs Review Labs Reviewed - No data to display  Imaging Review Ct Head Wo Contrast  11/14/2014  CLINICAL DATA:  Post fall this morning now with small hematoma involving the posterior aspect of the head with associated small laceration. EXAM: CT HEAD WITHOUT CONTRAST CT CERVICAL SPINE WITHOUT CONTRAST TECHNIQUE: Multidetector CT imaging of the head and cervical spine was performed following the standard protocol without intravenous contrast. Multiplanar CT image reconstructions of the cervical spine were also generated. COMPARISON:  Head CT - 09/18/2013; 08/19/2013; 08/11/2013 FINDINGS: CT HEAD FINDINGS There is a small amount of ill-defined soft tissue stranding about the left occipital calvarium which measures approximately 1.9 cm in length (image 11, series 2). There is a tiny foci of subcutaneous emphysema compatible with provided history of laceration. No radiopaque foreign body. No displaced calvarial  fracture. Similar findings of age advanced atrophy with sulcal prominence and centralized volume loss with commensurate ex vacuo dilatation of the ventricular system. Old lacunar infarcts within the right basal ganglia are unchanged (image 17, series 2 close. Rather extensive periventricular hypodensities compatible with microvascular ischemic disease. Given extensive background parenchymal abnormalities, there is no CT evidence of superimposed acute large territory infarct. No intraparenchymal or extra-axial mass or hemorrhage. Unchanged size and configuration of the ventricles and basilar cisterns. No midline shift. Intracranial atherosclerosis. There is scattered opacification of the bilateral anterior ethmoidal air cells. There is minimal polypoid mucosal thickening with the bilateral maxillary sinuses, left greater than right. The remaining paranasal sinuses and mastoid air cells are normally aerated. No air-fluid levels. CT CERVICAL SPINE FINDINGS C1 to the superior endplate of T1 is imaged. Normal alignment of the cervical spine. No anterolisthesis or retrolisthesis. The bilateral facets are normally aligned. The dens is normally positioned between the lateral masses of C1. Normal atlantodental and atlantoaxial articulations. No fracture or static subluxation of cervical spine. Cervical vertebral body heights are preserved. Prevertebral soft tissues are normal. Mild-to-moderate multilevel cervical spine DDD, worse at C5-C6 and C6-C7 with disc space height loss, endplate irregularity and anteriorly directed disc osteophyte complexes at these locations. No bulky cervical lymphadenopathy on this noncontrast examination. Normal noncontrast appearance of the thyroid gland. Limited visualization lung apices is normal. IMPRESSION: 1. Minimal amount of ill-defined soft tissue stranding about the posterior aspect of the left occiput with associated tiny foci of subcutaneous emphysema but without associated radiopaque  foreign body, displaced calvarial fracture or acute intracranial process. 2. No fracture or static subluxation of the cervical spine. 3. Similar findings of advanced atrophy and microvascular ischemic disease. 4. Mild to moderate multilevel cervical spine DDD. Electronically Signed   By: Simonne ComeJohn  Watts M.D.   On: 11/14/2014 12:42   Ct Cervical Spine Wo Contrast  11/14/2014  CLINICAL DATA:  Post fall this morning now with small hematoma involving the posterior aspect of the head with associated small laceration. EXAM: CT HEAD WITHOUT CONTRAST CT CERVICAL SPINE WITHOUT CONTRAST TECHNIQUE: Multidetector CT imaging of the head and cervical spine was performed following the standard protocol without intravenous contrast. Multiplanar CT image reconstructions of the cervical spine were also generated. COMPARISON:  Head CT - 09/18/2013; 08/19/2013; 08/11/2013 FINDINGS: CT HEAD FINDINGS There is a small amount of ill-defined soft tissue stranding about the left occipital calvarium which measures approximately 1.9 cm in length (image 11, series 2). There is a tiny foci of subcutaneous emphysema compatible with provided history of laceration. No radiopaque foreign body. No displaced calvarial fracture. Similar findings of  age advanced atrophy with sulcal prominence and centralized volume loss with commensurate ex vacuo dilatation of the ventricular system. Old lacunar infarcts within the right basal ganglia are unchanged (image 17, series 2 close. Rather extensive periventricular hypodensities compatible with microvascular ischemic disease. Given extensive background parenchymal abnormalities, there is no CT evidence of superimposed acute large territory infarct. No intraparenchymal or extra-axial mass or hemorrhage. Unchanged size and configuration of the ventricles and basilar cisterns. No midline shift. Intracranial atherosclerosis. There is scattered opacification of the bilateral anterior ethmoidal air cells. There is  minimal polypoid mucosal thickening with the bilateral maxillary sinuses, left greater than right. The remaining paranasal sinuses and mastoid air cells are normally aerated. No air-fluid levels. CT CERVICAL SPINE FINDINGS C1 to the superior endplate of T1 is imaged. Normal alignment of the cervical spine. No anterolisthesis or retrolisthesis. The bilateral facets are normally aligned. The dens is normally positioned between the lateral masses of C1. Normal atlantodental and atlantoaxial articulations. No fracture or static subluxation of cervical spine. Cervical vertebral body heights are preserved. Prevertebral soft tissues are normal. Mild-to-moderate multilevel cervical spine DDD, worse at C5-C6 and C6-C7 with disc space height loss, endplate irregularity and anteriorly directed disc osteophyte complexes at these locations. No bulky cervical lymphadenopathy on this noncontrast examination. Normal noncontrast appearance of the thyroid gland. Limited visualization lung apices is normal. IMPRESSION: 1. Minimal amount of ill-defined soft tissue stranding about the posterior aspect of the left occiput with associated tiny foci of subcutaneous emphysema but without associated radiopaque foreign body, displaced calvarial fracture or acute intracranial process. 2. No fracture or static subluxation of the cervical spine. 3. Similar findings of advanced atrophy and microvascular ischemic disease. 4. Mild to moderate multilevel cervical spine DDD. Electronically Signed   By: Simonne Come M.D.   On: 11/14/2014 12:42   I have personally reviewed and evaluated these images and lab results as part of my medical decision-making.   EKG Interpretation None      MDM   Final diagnoses:  Laceration of head, initial encounter    64 yo M status post mechanical fall. CT head and C-spine negative. Patient with a head laceration discussed repair bedside. Patient currently declining says he would rather does have a bandage  on it.  1:17 PM:  I have discussed the diagnosis/risks/treatment options with the patient and believe the pt to be eligible for discharge home to follow-up with PCP. We also discussed returning to the ED immediately if new or worsening sx occur. We discussed the sx which are most concerning (e.g., sudden worsening pain, fever, inability to tolerate by mouth) that necessitate immediate return. Medications administered to the patient during their visit and any new prescriptions provided to the patient are listed below.  Medications given during this visit Medications - No data to display  New Prescriptions   No medications on file    The patient appears reasonably screen and/or stabilized for discharge and I doubt any other medical condition or other [facility] requiring further screening, evaluation, or treatment in the ED at this time prior to discharge.      Melene Plan, DO 11/14/14 1317

## 2014-11-14 NOTE — ED Notes (Signed)
Per EMS, patient from [facility]. Patient had a fall this am, has a small hematoma to his posterior to head and a small laceration. [facility] is not taking any of his medications. Patient has a hx of being combative. Patient in C- Collar on arrival to ER.

## 2014-12-13 IMAGING — CT CT HEAD W/O CM
2 series · 16 of 30 positions shown, 19 images · non-contrast
Comparison: Prior CT from 08/19/2013

CLINICAL DATA: Fall

EXAM:
CT HEAD WITHOUT CONTRAST
TECHNIQUE: Contiguous axial images were obtained from the base of the skull
through the vertex without intravenous contrast.

[Series 2: head w/o · axial · non-contrast · 0.47mm/px · z∈[-57,+63]mm · 9 of 31 slices shown, 12 images]
[im 4/31  brain]
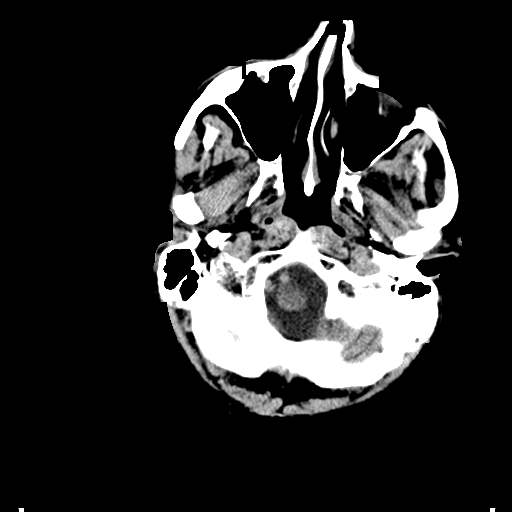
[im 4/31  bone]
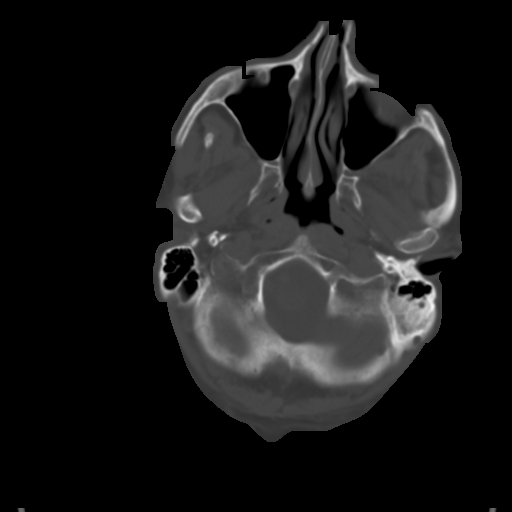
[im 7/31  brain]
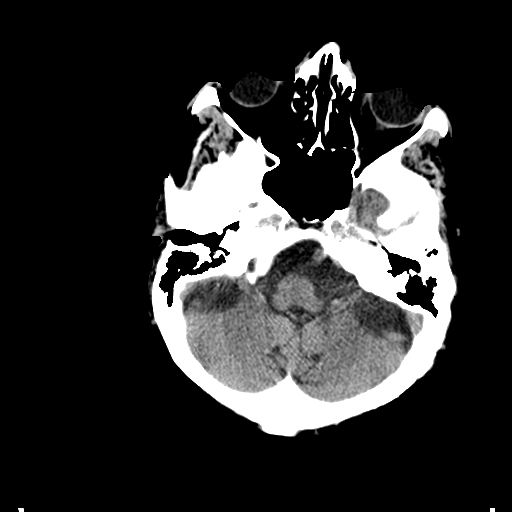
[im 10/31  brain]
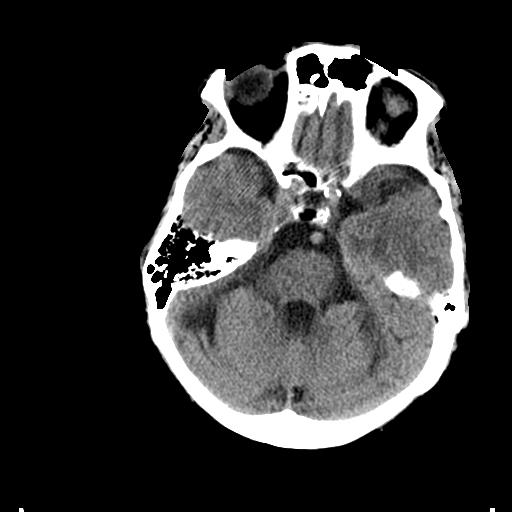
[im 13/31  brain]
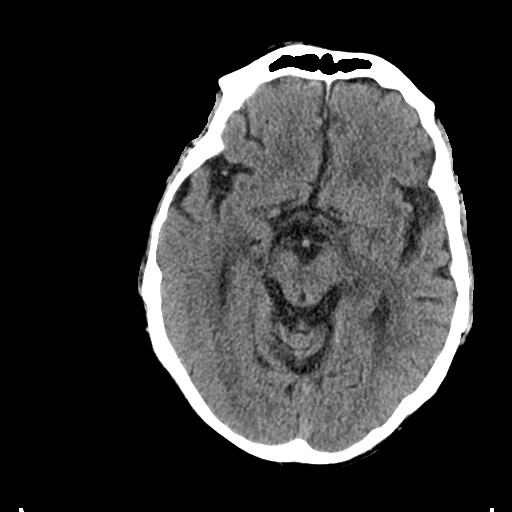
[im 16/31  brain]
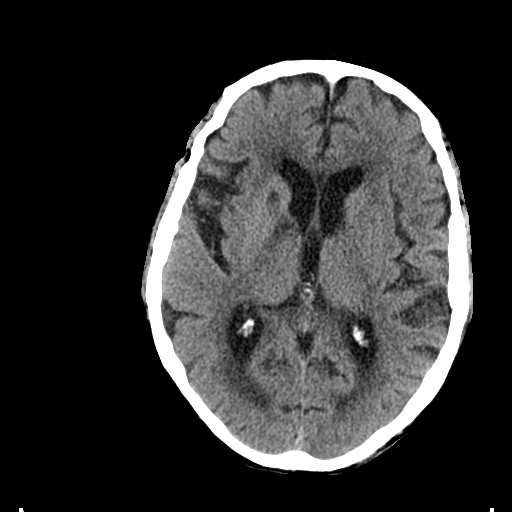
[im 16/31  bone]
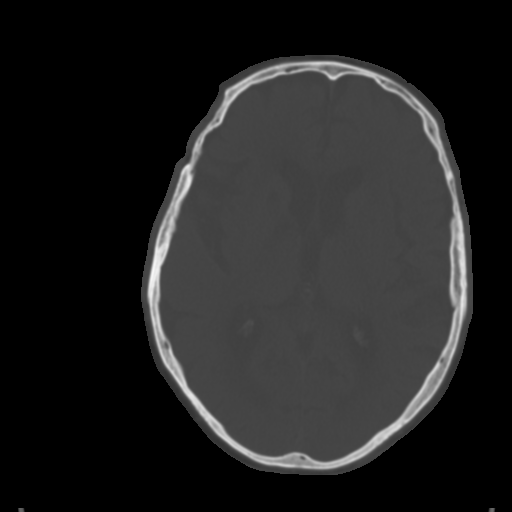
[im 19/31  brain]
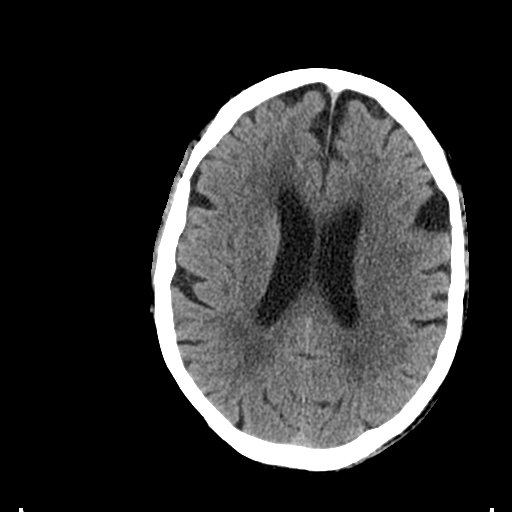
[im 22/31  brain]
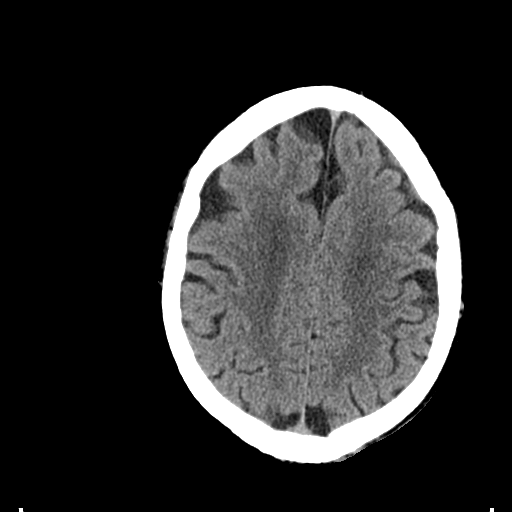
[im 25/31  brain]
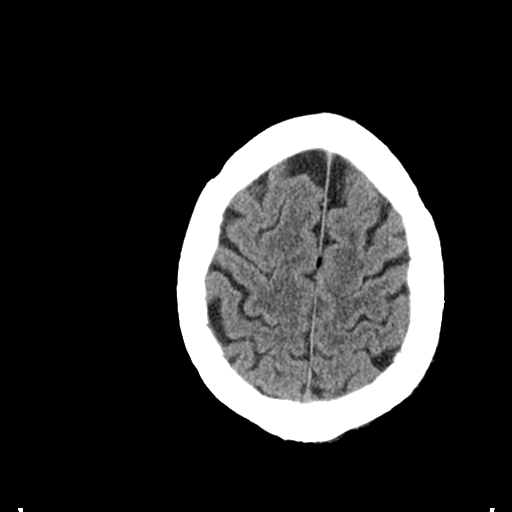
[im 28/31  brain]
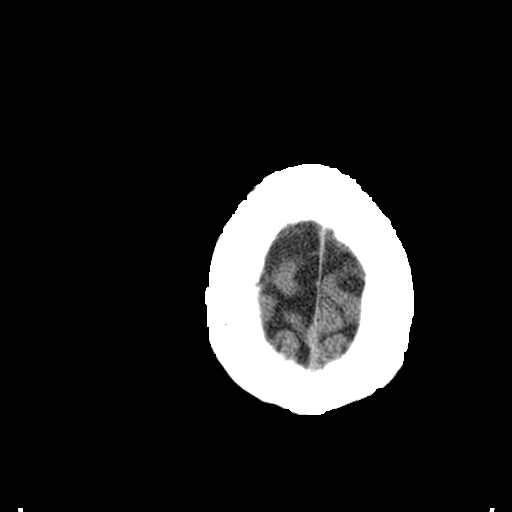
[im 28/31  bone]
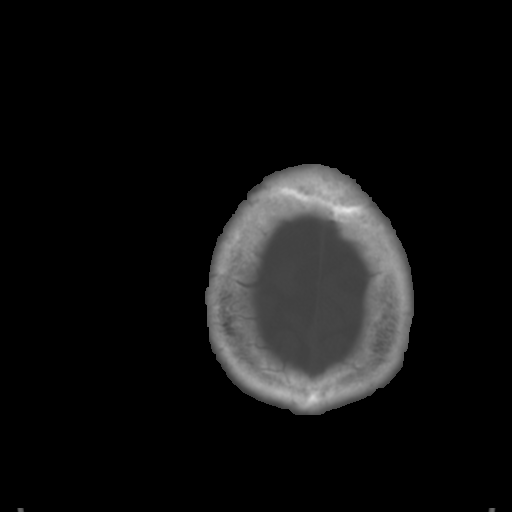

[Series 3: bone windows · axial · 0.47mm/px · z∈[-57,+42]mm · 7 of 51 slices shown]
[im 6/51  bone]
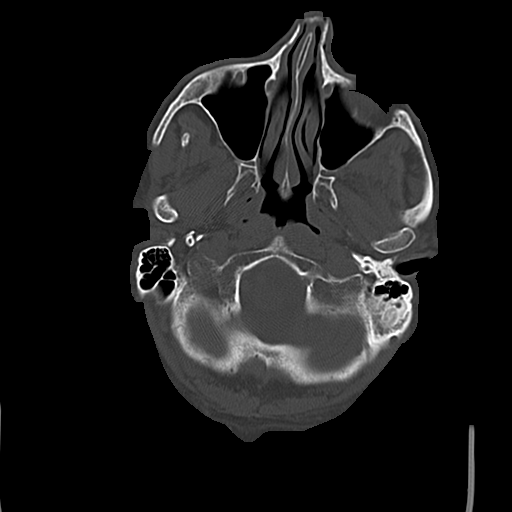
[im 12/51  bone]
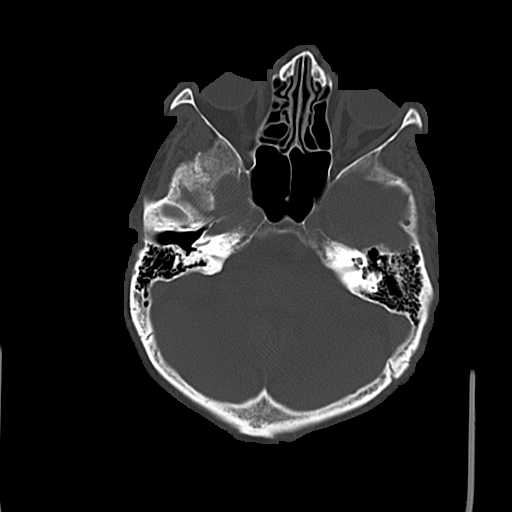
[im 17/51  bone]
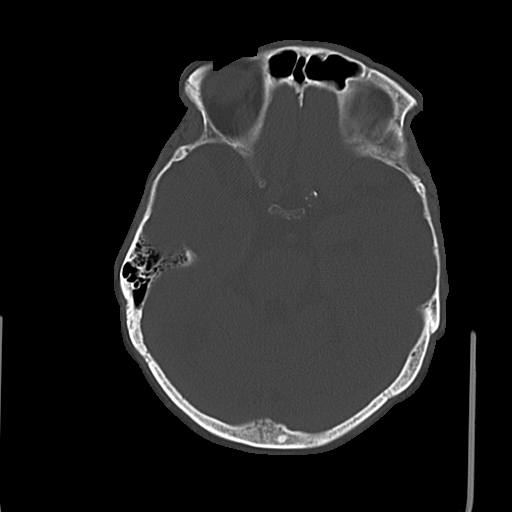
[im 23/51  bone]
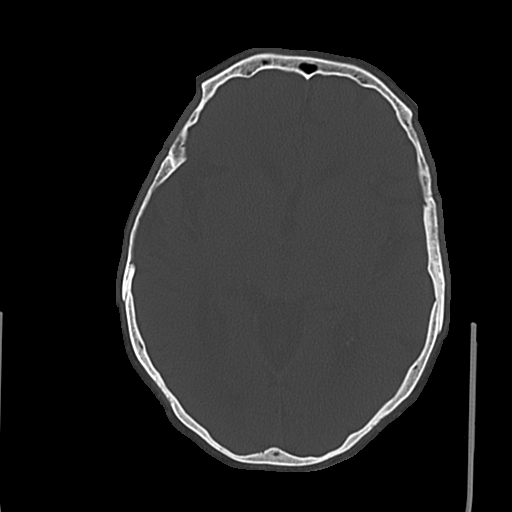
[im 28/51  bone]
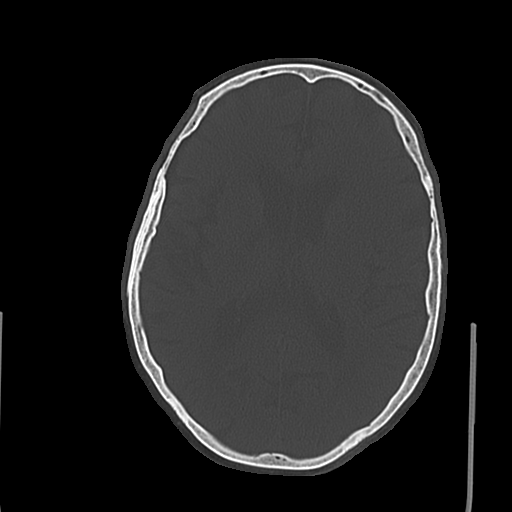
[im 34/51  bone]
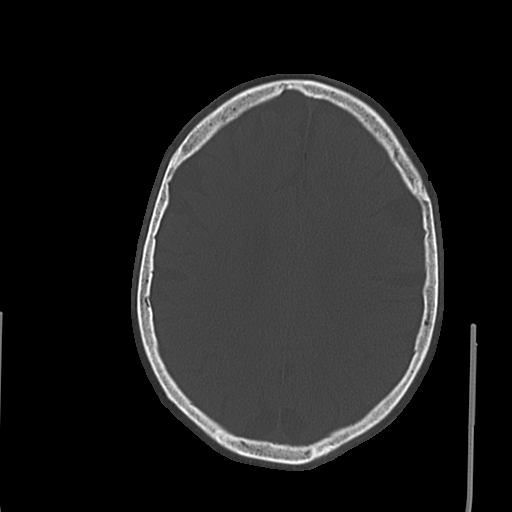
[im 39/51  bone]
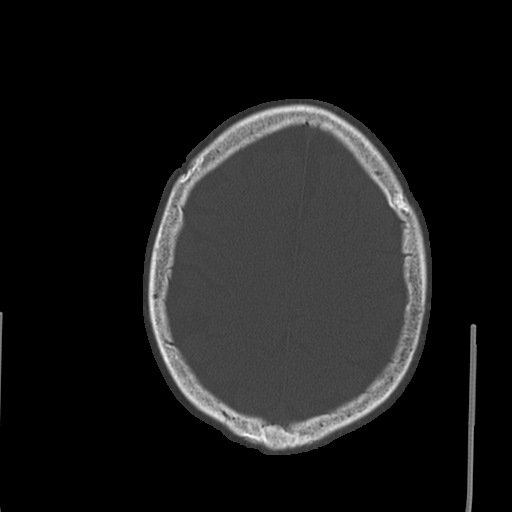

[16 of 30 positions shown; findings below may reference images not displayed]

FINDINGS: Atrophy with chronic small vessel ischemic disease present, stable
from prior. Remote lacunar infarct present within the right internal
capsule.

There is no acute intracranial hemorrhage or infarct. No mass lesion
or midline shift. Gray-white matter differentiation is well
maintained. Ventricles are normal in size without evidence of
hydrocephalus. CSF containing spaces are within normal limits. No
extra-axial fluid collection.

The calvarium is intact.

Orbital soft tissues are within normal limits.

The paranasal sinuses and mastoid air cells are well pneumatized and
free of fluid. Probable acute nondisplaced bilateral nasal bone
fractures present (series 3, image 7).

Laceration with swelling present at the right forehead.
IMPRESSION: 1. No acute intracranial process.
2. Acute bilateral nondisplaced nasal bone fractures.
3. Small right forehead scalp laceration.
4. Moderate atrophy with chronic small vessel ischemic disease.

## 2017-07-01 ENCOUNTER — Inpatient Hospital Stay (HOSPITAL_COMMUNITY)
Admission: EM | Admit: 2017-07-01 | Discharge: 2017-07-10 | DRG: 683 | Disposition: A | Discharge status: Hospice | Payer: Medicare Other | Attending: Internal Medicine | Admitting: Internal Medicine

## 2017-07-01 ENCOUNTER — Other Ambulatory Visit: Payer: Self-pay

## 2017-07-01 ENCOUNTER — Emergency Department (HOSPITAL_COMMUNITY): Payer: Medicare Other

## 2017-07-01 ENCOUNTER — Encounter (HOSPITAL_COMMUNITY): Payer: Self-pay | Admitting: Emergency Medicine

## 2017-07-01 DIAGNOSIS — R4781 Slurred speech: Secondary | ICD-10-CM | POA: Diagnosis present

## 2017-07-01 DIAGNOSIS — Z79899 Other long term (current) drug therapy: Secondary | ICD-10-CM | POA: Diagnosis not present

## 2017-07-01 DIAGNOSIS — N400 Enlarged prostate without lower urinary tract symptoms: Secondary | ICD-10-CM | POA: Diagnosis present

## 2017-07-01 DIAGNOSIS — I10 Essential (primary) hypertension: Secondary | ICD-10-CM | POA: Diagnosis present

## 2017-07-01 DIAGNOSIS — E872 Acidosis, unspecified: Secondary | ICD-10-CM

## 2017-07-01 DIAGNOSIS — Z515 Encounter for palliative care: Secondary | ICD-10-CM

## 2017-07-01 DIAGNOSIS — Z818 Family history of other mental and behavioral disorders: Secondary | ICD-10-CM | POA: Diagnosis not present

## 2017-07-01 DIAGNOSIS — N183 Chronic kidney disease, stage 3 unspecified: Secondary | ICD-10-CM | POA: Diagnosis present

## 2017-07-01 DIAGNOSIS — G9340 Encephalopathy, unspecified: Secondary | ICD-10-CM | POA: Diagnosis not present

## 2017-07-01 DIAGNOSIS — N281 Cyst of kidney, acquired: Secondary | ICD-10-CM | POA: Diagnosis present

## 2017-07-01 DIAGNOSIS — Z5329 Procedure and treatment not carried out because of patient's decision for other reasons: Secondary | ICD-10-CM | POA: Diagnosis not present

## 2017-07-01 DIAGNOSIS — Z66 Do not resuscitate: Secondary | ICD-10-CM | POA: Diagnosis present

## 2017-07-01 DIAGNOSIS — D649 Anemia, unspecified: Secondary | ICD-10-CM | POA: Diagnosis present

## 2017-07-01 DIAGNOSIS — G4700 Insomnia, unspecified: Secondary | ICD-10-CM | POA: Diagnosis not present

## 2017-07-01 DIAGNOSIS — F200 Paranoid schizophrenia: Secondary | ICD-10-CM | POA: Diagnosis present

## 2017-07-01 DIAGNOSIS — K2210 Ulcer of esophagus without bleeding: Secondary | ICD-10-CM | POA: Diagnosis present

## 2017-07-01 DIAGNOSIS — E1122 Type 2 diabetes mellitus with diabetic chronic kidney disease: Secondary | ICD-10-CM | POA: Diagnosis not present

## 2017-07-01 DIAGNOSIS — Z833 Family history of diabetes mellitus: Secondary | ICD-10-CM | POA: Diagnosis not present

## 2017-07-01 DIAGNOSIS — K760 Fatty (change of) liver, not elsewhere classified: Secondary | ICD-10-CM | POA: Diagnosis present

## 2017-07-01 DIAGNOSIS — Z8249 Family history of ischemic heart disease and other diseases of the circulatory system: Secondary | ICD-10-CM | POA: Diagnosis not present

## 2017-07-01 DIAGNOSIS — F209 Schizophrenia, unspecified: Secondary | ICD-10-CM

## 2017-07-01 DIAGNOSIS — R296 Repeated falls: Secondary | ICD-10-CM | POA: Diagnosis present

## 2017-07-01 DIAGNOSIS — Z96642 Presence of left artificial hip joint: Secondary | ICD-10-CM | POA: Diagnosis present

## 2017-07-01 DIAGNOSIS — E875 Hyperkalemia: Secondary | ICD-10-CM

## 2017-07-01 DIAGNOSIS — D631 Anemia in chronic kidney disease: Secondary | ICD-10-CM | POA: Diagnosis present

## 2017-07-01 DIAGNOSIS — Z87440 Personal history of urinary (tract) infections: Secondary | ICD-10-CM

## 2017-07-01 DIAGNOSIS — N189 Chronic kidney disease, unspecified: Secondary | ICD-10-CM | POA: Diagnosis not present

## 2017-07-01 DIAGNOSIS — N179 Acute kidney failure, unspecified: Secondary | ICD-10-CM | POA: Diagnosis not present

## 2017-07-01 DIAGNOSIS — I129 Hypertensive chronic kidney disease with stage 1 through stage 4 chronic kidney disease, or unspecified chronic kidney disease: Secondary | ICD-10-CM | POA: Diagnosis present

## 2017-07-01 DIAGNOSIS — F419 Anxiety disorder, unspecified: Secondary | ICD-10-CM | POA: Diagnosis not present

## 2017-07-01 LAB — CBC WITH DIFFERENTIAL/PLATELET
BASOS PCT: 1 %
Basophils Absolute: 0.1 10*3/uL (ref 0.0–0.1)
Eosinophils Absolute: 0.3 10*3/uL (ref 0.0–0.7)
Eosinophils Relative: 3 %
HEMATOCRIT: 33.3 % — AB (ref 39.0–52.0)
HEMOGLOBIN: 11 g/dL — AB (ref 13.0–17.0)
Lymphocytes Relative: 24 %
Lymphs Abs: 2.1 10*3/uL (ref 0.7–4.0)
MCH: 28.9 pg (ref 26.0–34.0)
MCHC: 33 g/dL (ref 30.0–36.0)
MCV: 87.4 fL (ref 78.0–100.0)
Monocytes Absolute: 0.7 10*3/uL (ref 0.1–1.0)
Monocytes Relative: 7 %
NEUTROS ABS: 5.7 10*3/uL (ref 1.7–7.7)
NEUTROS PCT: 65 %
Platelets: 536 10*3/uL — ABNORMAL HIGH (ref 150–400)
RBC: 3.81 MIL/uL — ABNORMAL LOW (ref 4.22–5.81)
RDW: 14.2 % (ref 11.5–15.5)
WBC: 8.8 10*3/uL (ref 4.0–10.5)

## 2017-07-01 LAB — COMPREHENSIVE METABOLIC PANEL
ALBUMIN: 3.4 g/dL — AB (ref 3.5–5.0)
ALK PHOS: 37 U/L — AB (ref 38–126)
ALT: <5 U/L — ABNORMAL LOW (ref 17–63)
ALT: 6 U/L — AB (ref 17–63)
ANION GAP: 20 — AB (ref 5–15)
AST: 6 U/L — ABNORMAL LOW (ref 15–41)
AST: 6 U/L — AB (ref 15–41)
Albumin: 3.1 g/dL — ABNORMAL LOW (ref 3.5–5.0)
Alkaline Phosphatase: 35 U/L — ABNORMAL LOW (ref 38–126)
Anion gap: 18 — ABNORMAL HIGH (ref 5–15)
BILIRUBIN TOTAL: 0.7 mg/dL (ref 0.3–1.2)
BILIRUBIN TOTAL: 0.6 mg/dL (ref 0.3–1.2)
BUN: 141 mg/dL — ABNORMAL HIGH (ref 6–20)
BUN: 141 mg/dL — AB (ref 6–20)
CALCIUM: 8.1 mg/dL — AB (ref 8.9–10.3)
CO2: 9 mmol/L — AB (ref 22–32)
CO2: 10 mmol/L — ABNORMAL LOW (ref 22–32)
CREATININE: 15.27 mg/dL — AB (ref 0.61–1.24)
Calcium: 7.9 mg/dL — ABNORMAL LOW (ref 8.9–10.3)
Chloride: 112 mmol/L — ABNORMAL HIGH (ref 101–111)
Chloride: 113 mmol/L — ABNORMAL HIGH (ref 101–111)
Creatinine, Ser: 15.28 mg/dL — ABNORMAL HIGH (ref 0.61–1.24)
GFR calc Af Amer: 3 mL/min — ABNORMAL LOW (ref >60)
GFR calc non Af Amer: 3 mL/min — ABNORMAL LOW (ref >60)
GFR, EST AFRICAN AMERICAN: 3 mL/min — AB (ref >60)
GFR, EST NON AFRICAN AMERICAN: 3 mL/min — AB (ref >60)
Glucose, Bld: 147 mg/dL — ABNORMAL HIGH (ref 65–99)
Glucose, Bld: 111 mg/dL — ABNORMAL HIGH (ref 65–99)
Potassium: 5.9 mmol/L — ABNORMAL HIGH (ref 3.5–5.1)
Potassium: 5.6 mmol/L — ABNORMAL HIGH (ref 3.5–5.1)
SODIUM: 141 mmol/L (ref 135–145)
Sodium: 141 mmol/L (ref 135–145)
TOTAL PROTEIN: 6.7 g/dL (ref 6.5–8.1)
TOTAL PROTEIN: 6.2 g/dL — AB (ref 6.5–8.1)

## 2017-07-01 LAB — URINALYSIS, ROUTINE W REFLEX MICROSCOPIC
Bilirubin Urine: NEGATIVE
Glucose, UA: NEGATIVE mg/dL
Hgb urine dipstick: NEGATIVE
Ketones, ur: NEGATIVE mg/dL
Leukocytes, UA: NEGATIVE
NITRITE: NEGATIVE
PH: 7 (ref 5.0–8.0)
Protein, ur: NEGATIVE mg/dL
SPECIFIC GRAVITY, URINE: 1.005 (ref 1.005–1.030)

## 2017-07-01 LAB — CBC
HCT: 31.3 % — ABNORMAL LOW (ref 39.0–52.0)
Hemoglobin: 10.1 g/dL — ABNORMAL LOW (ref 13.0–17.0)
MCH: 28.1 pg (ref 26.0–34.0)
MCHC: 32.3 g/dL (ref 30.0–36.0)
MCV: 87.2 fL (ref 78.0–100.0)
Platelets: 514 10*3/uL — ABNORMAL HIGH (ref 150–400)
RBC: 3.59 MIL/uL — AB (ref 4.22–5.81)
RDW: 13.8 % (ref 11.5–15.5)
WBC: 8.4 10*3/uL (ref 4.0–10.5)

## 2017-07-01 LAB — OSMOLALITY: Osmolality: 340 mOsm/kg (ref 275–295)

## 2017-07-01 LAB — TSH: TSH: 0.917 u[IU]/mL (ref 0.350–4.500)

## 2017-07-01 LAB — HEMOGLOBIN A1C
Hgb A1c MFr Bld: 6.4 % — ABNORMAL HIGH (ref 4.8–5.6)
Mean Plasma Glucose: 136.98 mg/dL

## 2017-07-01 LAB — CBG MONITORING, ED: Glucose-Capillary: 130 mg/dL — ABNORMAL HIGH (ref 65–99)

## 2017-07-01 MED ORDER — SODIUM CHLORIDE 0.9 % IV SOLN: INTRAVENOUS | Status: DC

## 2017-07-01 MED ORDER — OLANZAPINE 5 MG PO TABS
5.0000 mg | ORAL_TABLET | Freq: Two times a day (BID) | ORAL | Status: DC
  Administered 2017-07-02 – 2017-07-10 (×16): 5 mg via ORAL
  Filled 2017-07-01 (×18): qty 1

## 2017-07-01 MED ORDER — ACETAMINOPHEN 650 MG RE SUPP: 650.0000 mg | Freq: Four times a day (QID) | RECTAL | Status: DC | PRN

## 2017-07-01 MED ORDER — PANTOPRAZOLE SODIUM 40 MG PO TBEC
40.0000 mg | DELAYED_RELEASE_TABLET | Freq: Two times a day (BID) | ORAL | Status: DC
  Administered 2017-07-01 – 2017-07-02 (×2): 40 mg via ORAL
  Filled 2017-07-01 (×2): qty 1

## 2017-07-01 MED ORDER — INSULIN ASPART 100 UNIT/ML ~~LOC~~ SOLN
0.0000 [IU] | Freq: Three times a day (TID) | SUBCUTANEOUS | Status: DC
Start: 2017-07-01 — End: 2017-07-10
  Administered 2017-07-01 – 2017-07-04 (×2): 1 [IU] via SUBCUTANEOUS
  Administered 2017-07-07: 2 [IU] via SUBCUTANEOUS
  Administered 2017-07-09 (×2): 1 [IU] via SUBCUTANEOUS
  Filled 2017-07-01: qty 1

## 2017-07-01 MED ORDER — HEPARIN SODIUM (PORCINE) 5000 UNIT/ML IJ SOLN
5000.0000 [IU] | Freq: Three times a day (TID) | INTRAMUSCULAR | Status: DC
  Administered 2017-07-02 – 2017-07-09 (×11): 5000 [IU] via SUBCUTANEOUS
  Filled 2017-07-01 (×15): qty 1

## 2017-07-01 MED ORDER — ACETAMINOPHEN 325 MG PO TABS
650.0000 mg | ORAL_TABLET | Freq: Four times a day (QID) | ORAL | Status: DC | PRN
  Filled 2017-07-01: qty 2

## 2017-07-01 MED ORDER — POLYETHYLENE GLYCOL 3350 17 G PO PACK: 17.0000 g | PACK | Freq: Every day | ORAL | Status: DC | PRN

## 2017-07-01 MED ORDER — CLONAZEPAM 0.25 MG PO TBDP
0.2500 mg | ORAL_TABLET | Freq: Every evening | ORAL | Status: DC
  Administered 2017-07-01 – 2017-07-09 (×8): 0.25 mg via ORAL
  Filled 2017-07-01 (×9): qty 1

## 2017-07-01 MED ORDER — TRIHEXYPHENIDYL HCL 5 MG PO TABS: 2.5000 mg | ORAL_TABLET | Freq: Every day | ORAL | Status: DC

## 2017-07-01 MED ORDER — SODIUM CHLORIDE 0.9 % IV BOLUS
1000.0000 mL | Freq: Once | INTRAVENOUS | Status: AC
  Administered 2017-07-01: 1000 mL via INTRAVENOUS

## 2017-07-01 MED ORDER — ATENOLOL 50 MG PO TABS
50.0000 mg | ORAL_TABLET | Freq: Every day | ORAL | Status: DC
  Administered 2017-07-02 – 2017-07-10 (×8): 50 mg via ORAL
  Filled 2017-07-01 (×9): qty 1

## 2017-07-01 MED ORDER — BISACODYL 10 MG RE SUPP: 10.0000 mg | Freq: Every day | RECTAL | Status: DC | PRN

## 2017-07-01 MED ORDER — HYDRALAZINE HCL 25 MG PO TABS
25.0000 mg | ORAL_TABLET | Freq: Three times a day (TID) | ORAL | Status: DC
  Administered 2017-07-01 – 2017-07-05 (×10): 25 mg via ORAL
  Filled 2017-07-01 (×11): qty 1

## 2017-07-01 MED ORDER — STERILE WATER FOR INJECTION IV SOLN
INTRAVENOUS | Status: DC
  Administered 2017-07-02 (×2): via INTRAVENOUS
  Filled 2017-07-01 (×5): qty 850

## 2017-07-01 MED ORDER — ONDANSETRON HCL 4 MG PO TABS: 4.0000 mg | ORAL_TABLET | Freq: Four times a day (QID) | ORAL | Status: DC | PRN

## 2017-07-01 MED ORDER — VITAMIN B-12 1000 MCG PO TABS
1000.0000 ug | ORAL_TABLET | Freq: Every day | ORAL | Status: DC
  Administered 2017-07-02 – 2017-07-10 (×8): 1000 ug via ORAL
  Filled 2017-07-01 (×9): qty 1

## 2017-07-01 NOTE — ED Notes (Signed)
Brent BullaWaleed (nephew) [phone]  Caregiver [phone]

## 2017-07-01 NOTE — Consult Note (Signed)
Referring Provider: No ref. provider found Primary Care Physician:  Florentina Jenny, MD Primary Nephrologist:     Reason for Consultation:   Uremia  Acute vs chronic renal failure  Metabolic acidosis   HPI:  This is an unfortunate man with history of schizophrenia, ulcerative colitis  hypertension and chronic renal failure that the only set of labs I have available from the past is a serum creatinine of 1.79 in September 2016. He was brought into [facility] emergency room with a loss of appetite  It was found that he had a BUN of 116 and creatinine of 15  Initially it was felt that there was some bladder obstruction as the patient was taking medications for BPH  - flomax however there was no obstruction noted on the renal ultrasound and no distention of the bladder noted. The history is very sketchy as he is alone in the ER and is very difficult to understand although he is coherent and not actively aggressive.  He has a metabolic acidosis although I do not see any metformin in his medication list   His initial urine sediment is benign  There are no hiccups and his odor is not particularly uremic despite a high BUN   There is no dyspnea and no edema and he looks very pale. He says that he is suspicious of hospitals  As an outpatient it looks as though there is lisinopril, I would be curious if his primary MD would have additional creatinines    Past Medical History:  Diagnosis Date  . Aspiration pneumonia (HCC) 08/21/2013  . Diabetes mellitus without complication (HCC)   . Hepatic steatosis 10/03/2014  . Hip fracture (HCC) 03/30/2012  . Hypertension   . Mallory-Weiss tear 10/03/2014  . Mental disorder   . Periprosthetic hip fracture--nondisplaced, Left 08/05/2013  . Renal cyst, right   . Schizophrenia (HCC)   . Subcapital fracture of neck of femur (HCC) 03/31/2012  . Ulcerative esophagitis 10/03/2014    Past Surgical History:  Procedure Laterality Date  . ESOPHAGOGASTRODUODENOSCOPY (EGD) WITH PROPOFOL  N/A 09/30/2014   Procedure: ESOPHAGOGASTRODUODENOSCOPY (EGD) WITH PROPOFOL;  Surgeon: Vida Rigger, MD;  Location: [facility] ENDOSCOPY;  Service: Endoscopy;  Laterality: N/A;  . HIP ARTHROPLASTY Left 03/31/2012   Procedure: ARTHROPLASTY BIPOLAR HIP;  Surgeon: Mable Paris, MD;  Location: [facility] ORS;  Service: Orthopedics;  Laterality: Left;  . JOINT REPLACEMENT      Prior to Admission medications   Medication Sig Start Date End Date Taking? Authorizing Provider  amLODipine (NORVASC) 10 MG tablet Take 1 tablet (10 mg total) by mouth daily. 04/18/12   Viyuoh, Rolland Bimler, MD  atenolol (TENORMIN) 100 MG tablet Take 1 tablet (100 mg total) by mouth daily. 04/18/12   Viyuoh, Rolland Bimler, MD  atenolol (TENORMIN) 50 MG tablet Take 50 mg by mouth daily.    [provider]  cefUROXime (CEFTIN) 250 MG tablet Take 1 tablet (250 mg total) by mouth 2 (two) times daily with a meal. Patient not taking: Reported on 11/14/2014 10/15/14   Jeralyn Bennett, MD  clonazePAM (KLONOPIN) 0.5 MG tablet Take 0.5 tablets (0.25 mg total) by mouth as directed. Take 0.25 mg scheduled once every evening, and may also take 0.25mg  once in the morning as needed for anxiety. Patient taking differently: Take 0.25 mg by mouth every evening. Take 0.25 mg scheduled once every evening, and may also take 0.25 mg in the morning as needed for anxiety 10/15/14   Jeralyn Bennett, MD  cloNIDine (CATAPRES -  DOSED IN MG/24 HR) 0.3 mg/24hr Place 1 patch (0.3 mg total) onto the skin once a week. Patient not taking: Reported on 11/14/2014 04/18/12   Kela MillinViyuoh, Adeline C, MD  haloperidol (HALDOL) 0.5 MG tablet Take 0.5 mg by mouth every morning.    [provider]  haloperidol (HALDOL) 5 MG tablet Take 5 mg by mouth at bedtime. Take 2.5mg  in the morning and then take 5mg  daily at night    [provider]  haloperidol decanoate (HALDOL DECANOATE) 100 MG/ML injection Inject 100 mg into the muscle every 28 (twenty-eight) days.    [provider]  hydrALAZINE (APRESOLINE) 50 MG tablet Take 25 mg by mouth 3 (three) times daily.     [provider]  lisinopril (PRINIVIL,ZESTRIL) 20 MG tablet Take 20 mg by mouth daily.    [provider]  loperamide (IMODIUM) 2 MG capsule Take 2 mg by mouth 3 (three) times daily as needed for diarrhea or loose stools.    [provider]  morphine (MS CONTIN) 15 MG 12 hr tablet Take 1 tablet (15 mg total) by mouth every 12 (twelve) hours as needed for pain. 10/15/14   Jeralyn BennettZamora, Ezequiel, MD  OLANZapine (ZYPREXA) 5 MG tablet Take 5 mg by mouth 2 (two) times daily. And may take 1 additional dose of 5mg  as needed for pychosis    [provider]  ondansetron (ZOFRAN) 4 MG tablet Take 1 tablet (4 mg total) by mouth every 6 (six) hours as needed for nausea. 04/18/12   Viyuoh, Rolland BimlerAdeline C, MD  pantoprazole (PROTONIX) 40 MG tablet Take 1 tablet (40 mg total) by mouth 2 (two) times daily. 10/03/14   Rama, Maryruth Bunhristina P, MD  spironolactone (ALDACTONE) 25 MG tablet Take 12.5 mg by mouth daily.    [provider]  tamsulosin (FLOMAX) 0.4 MG CAPS capsule Take 0.4 mg by mouth.    [provider]  trihexyphenidyl (ARTANE) 5 MG tablet Take 2.5 mg by mouth daily.     [provider]  vitamin B-12 1000 MCG tablet Take 1 tablet (1,000 mcg total) by mouth daily. 04/18/12   Kela MillinViyuoh, Adeline C, MD    Current Facility-Administered Medications  Medication Dose Route Frequency Provider Last Rate Last Dose  . insulin aspart (novoLOG) injection 0-9 Units  0-9 Units Subcutaneous TID WC Marguerita MerlesSheikh, Omair Spirit LakeLatif, DO   1 Units at 07/01/17 1719  . sodium bicarbonate 150 mEq in sterile water 1,000 mL infusion   Intravenous Continuous Elvis CoilWebb, Acea Yagi, MD       Current Outpatient Medications  Medication Sig Dispense Refill  . amLODipine (NORVASC) 10 MG tablet Take 1 tablet (10 mg total) by mouth daily.    Marland Kitchen. atenolol (TENORMIN) 100 MG tablet Take 1 tablet (100 mg total) by mouth daily.     Marland Kitchen. atenolol (TENORMIN) 50 MG tablet Take 50 mg by mouth daily.    . cefUROXime (CEFTIN) 250 MG tablet Take 1 tablet (250 mg total) by mouth 2 (two) times daily with a meal. (Patient not taking: Reported on 11/14/2014) 8 tablet 0  . clonazePAM (KLONOPIN) 0.5 MG tablet Take 0.5 tablets (0.25 mg total) by mouth as directed. Take 0.25 mg scheduled once every evening, and may also take 0.25mg  once in the morning as needed for anxiety. (Patient taking differently: Take 0.25 mg by mouth every evening. Take 0.25 mg scheduled once every evening, and may also take 0.25 mg in the morning as needed for anxiety) 30 tablet 0  . cloNIDine (CATAPRES -  DOSED IN MG/24 HR) 0.3 mg/24hr Place 1 patch (0.3 mg total) onto the skin once a week. (Patient not taking: Reported on 11/14/2014) 4 patch   . haloperidol (HALDOL) 0.5 MG tablet Take 0.5 mg by mouth every morning.    . haloperidol (HALDOL) 5 MG tablet Take 5 mg by mouth at bedtime. Take 2.5mg  in the morning and then take 5mg  daily at night    . haloperidol decanoate (HALDOL DECANOATE) 100 MG/ML injection Inject 100 mg into the muscle every 28 (twenty-eight) days.    . hydrALAZINE (APRESOLINE) 50 MG tablet Take 25 mg by mouth 3 (three) times daily.     Marland Kitchen lisinopril (PRINIVIL,ZESTRIL) 20 MG tablet Take 20 mg by mouth daily.    Marland Kitchen loperamide (IMODIUM) 2 MG capsule Take 2 mg by mouth 3 (three) times daily as needed for diarrhea or loose stools.    Marland Kitchen morphine (MS CONTIN) 15 MG 12 hr tablet Take 1 tablet (15 mg total) by mouth every 12 (twelve) hours as needed for pain. 30 tablet 0  . OLANZapine (ZYPREXA) 5 MG tablet Take 5 mg by mouth 2 (two) times daily. And may take 1 additional dose of 5mg  as needed for pychosis    . ondansetron (ZOFRAN) 4 MG tablet Take 1 tablet (4 mg total) by mouth every 6 (six) hours as needed for nausea. 20 tablet   . pantoprazole (PROTONIX) 40 MG tablet Take 1 tablet (40 mg total) by mouth 2 (two) times daily. 60 tablet 2  . spironolactone  (ALDACTONE) 25 MG tablet Take 12.5 mg by mouth daily.    . tamsulosin (FLOMAX) 0.4 MG CAPS capsule Take 0.4 mg by mouth.    . trihexyphenidyl (ARTANE) 5 MG tablet Take 2.5 mg by mouth daily.     . vitamin B-12 1000 MCG tablet Take 1 tablet (1,000 mcg total) by mouth daily.      Allergies as of 07/01/2017  . (No Known Allergies)    Family History  Problem Relation Age of Onset  . Diabetes type II Mother   . Hypertension Father     Social History   Socioeconomic History  . Marital status: Single    Spouse name: Not on file  . Number of children: Not on file  . Years of education: Not on file  . Highest education level: Not on file  Occupational History  . Not on file  Social Needs  . Financial resource strain: Not on file  . Food insecurity:    Worry: Not on file    Inability: Not on file  . Transportation needs:    Medical: Not on file    Non-medical: Not on file  Tobacco Use  . Smoking status: Never Smoker  . Smokeless tobacco: Never Used  Substance and Sexual Activity  . Alcohol use: No    Comment: not answering  . Drug use: No    Comment: not answering questions  . Sexual activity: Never    Comment: not cooperative with questions  Lifestyle  . Physical activity:    Days per week: Not on file    Minutes per session: Not on file  . Stress: Not on file  Relationships  . Social connections:    Talks on phone: Not on file    Gets together: Not on file    Attends religious service: Not on file    Active member of club or organization: Not on file    Attends meetings of clubs or organizations: Not on  file    Relationship status: Not on file  . Intimate partner violence:    Fear of current or ex partner: Not on file    Emotionally abused: Not on file    Physically abused: Not on file    Forced sexual activity: Not on file  Other Topics Concern  . Not on file  Social History Narrative  . Not on file    Review of Systems: Gen: Denies any fever, chills,  sweats, +anorexia,+ fatigue,+ weakness, malaise, +weight loss, and sleep disorder HEENT: No visual complaints, No history of Retinopathy. Normal external appearance No Epistaxis or Sore throat. No sinusitis.   CV: Denies chest pain, angina, palpitations, syncope, orthopnea, PND, peripheral edema, and claudication. Resp: Denies dyspnea at rest, dyspnea with exercise, cough, sputum, wheezing, coughing up blood, and pleurisy. GI: Denies vomiting blood, jaundice, and fecal incontinence.   Denies dysphagia or odynophagia. GU : Denies urinary burning, blood in urine, urinary frequency, urinary hesitancy, nocturnal urination, and urinary incontinence.  No renal calculi. MS: Denies joint pain, limitation of movement, and swelling, stiffness, low back pain, extremity pain. Denies muscle weakness, cramps, atrophy.  No use of non steroidal antiinflammatory drugs. Derm: Denies rash, itching, dry skin, hives, moles, warts, or unhealing ulcers.  Psych Schizophrenia  Heme: Denies bruising, bleeding, and enlarged lymph nodes. Neuro: No headache.  No diplopia. No dysarthria.  No dysphasia.  No history of CVA.  No Seizures. No paresthesias.  No weakness. Endocrine DM.  No Thyroid disease.  No Adrenal disease.  Physical Exam: Vital signs in last 24 hours: Temp:  [98 F (36.7 C)] 98 F (36.7 C) (06/06 1214) Pulse Rate:  [81-98] 81 (06/06 1629) Resp:  [17] 17 (06/06 1629) BP: (116-122)/(66-68) 122/68 (06/06 1629) SpO2:  [98 %-99 %] 99 % (06/06 1629)   General:    Alert fairly anxious although cooperative  Head:  Normocephalic and atraumatic. Eyes:  Sclera clear, no icterus.   Conjunctiva pink. Ears:  Normal auditory acuity. Nose:  No deformity, discharge,  or lesions. Mouth:  No deformity or lesions, dentition normal. Neck:  Supple; no masses or thyromegaly. JVP not elevated Lungs:  Clear throughout to auscultation.   No wheezes, crackles, or rhonchi. No acute distress. Heart:  Regular rate and rhythm; no  murmurs, clicks, rubs,  or gallops. No uremic rub  Abdomen:  Soft, nontender and nondistended. No masses, hepatosplenomegaly or hernias noted. Normal bowel sounds, without guarding, and without rebound.   Msk:  Symmetrical without gross deformities. Normal posture. Pulses:  No carotid, renal, femoral bruits. DP and PT symmetrical and equal Extremities:  Without clubbing or edema.   Intake/Output from previous day: No intake/output data recorded. Intake/Output this shift: No intake/output data recorded.  Lab Results: Recent Labs    07/01/17 1334  WBC 8.8  HGB 11.0*  HCT 33.3*  PLT 536*   BMET Recent Labs    07/01/17 1334  NA 141  K 5.9*  CL 112*  CO2 9*  GLUCOSE 147*  BUN 141*  CREATININE 15.28*  CALCIUM 8.1*   LFT Recent Labs    07/01/17 1334  PROT 6.7  ALBUMIN 3.4*  AST 6*  ALT <5*  ALKPHOS 37*  BILITOT 0.7   PT/INR No results for input(s): LABPROT, INR in the last 72 hours. Hepatitis Panel No results for input(s): HEPBSAG, HCVAB, HEPAIGM, HEPBIGM in the last 72 hours.  Studies/Results: US Renal  Result Date: 07/01/2017 CLINICAL DATA:  Elevated creatinine, altered mental status. EXAM: RENAL / URINARY  TRACT ULTRASOUND COMPLETE COMPARISON:  CT abdomen pelvis October 10, 2014 FINDINGS: Right Kidney: Length: 12.4 cm. Increased cortical echogenicity. 6.1 x 6.5 x 6.3 cm exophytic upper pole anechoic cyst without septations or solid components. 2.4 x 2.3 x 2.5 cm exophytic interpolar cyst. Small volume perinephric free fluid. Left Kidney: Length: 12.9 cm. Increased cortical echogenicity. 1.3 cm exophytic cyst lower pole left kidney. Bladder: Appears normal for degree of bladder distention. Prostate is 4.9 x 4.1 x 5.4 cm. IMPRESSION: 1. Echogenic kidneys compatible with medical renal disease. No obstructive uropathy. 2. Bilateral renal cysts measuring to 6.5 cm on the right. Electronically Signed   By: Awilda Metro M.D.   On: 07/01/2017 17:39     Assessment/Plan:  Acute versus chronic renal failure  My suspicion is that this has been present for a while  He has no obstruction on renal ultrasound although has a history of hypertension and has been taking Lisinopril as an outpatient  He is followed by Dr Redmond School and we should be able to obtain some baseline creatinine from his office. I would not give any ace inhibitors of arbs to this man at this time and would not administer any contrast or nsaids.  His urine studies are benign and this would make AIN or an AGN very unlikely in the differential. This may be unrecognized progression of his renal disease although we shall hydrate him with some IV bicarbonate.  I believe that if this is end stage renal disease then Mr Spratlin may not be a candidate for long term hemodialysis due to his schizophrenia  Certainly placing a dialysis catheter is very risky in this man. Careful consultation with psychiatry will need to be had,   Metabolic acidosis  We shall treat with iV bicarbonate and start at 125 cc/hr    Hyperkalemia  Patient was on a ace and this has been stopped  I think that correction of the acidosis will help with the correction of the hyperkalemia   Anemia  Probably chronic disease and will check iron studies    LOS: 0 Annajulia Lewing W @TODAY @6 :20 PM

## 2017-07-01 NOTE — ED Provider Notes (Signed)
[facility]-EMERGENCY DEPT Provider Note   CSN: [id number] Arrival date & time: 07/01/17  1202     History   Chief Complaint Chief Complaint  Patient presents with  . Anorexia    HPI David Lang is a 67 y.o. male.  HPI   Patient is a 67 year old male past medical history significant for schizophrenia, ulcerative esophagitis, and diabetes.  He presented today with his caregiver.  Unfortunately caregiver was not in the room by the time he arrived.  Caregiver brought him here because he was "not eating as much".  Patient had no other symptoms.  When I approached the room patient said to me "when can I eat a sandwich".  Patient is unable to tell me anything additional about his history.    Level 5 caveat baseline confusion.     Past Medical History:  Diagnosis Date  . Aspiration pneumonia (HCC) 08/21/2013  . Diabetes mellitus without complication (HCC)   . Hepatic steatosis 10/03/2014  . Hip fracture (HCC) 03/30/2012  . Hypertension   . Mallory-Weiss tear 10/03/2014  . Mental disorder   . Periprosthetic hip fracture--nondisplaced, Left 08/05/2013  . Renal cyst, right   . Schizophrenia (HCC)   . Subcapital fracture of neck of femur (HCC) 03/31/2012  . Ulcerative esophagitis 10/03/2014    Patient Active Problem List   Diagnosis Date Noted  . Acute kidney injury superimposed on chronic kidney disease (HCC) 07/01/2017  . Hyperkalemia, diminished renal excretion 07/01/2017  . Metabolic acidosis 07/01/2017  . UTI (lower urinary tract infection) 10/09/2014  . Sepsis secondary to UTI (HCC) 10/09/2014  . Mallory-Weiss tear 10/03/2014  . Ulcerative esophagitis 10/03/2014  . Lactic acidosis 10/03/2014  . Hepatic steatosis 10/03/2014  . Hematemesis 09/29/2014  . Hyperammonemia (HCC) 09/29/2014  . Bleeding gastrointestinal   . Falls frequently 08/21/2013  . Acute encephalopathy 08/19/2013  . Acute renal failure superimposed on stage 3 chronic kidney disease (HCC)  08/11/2013  . Chronic anemia 04/04/2012  . DM type 2 causing CKD stage 3 (HCC) 03/23/2012  . Incontinence 03/16/2012  . Schizophrenia, paranoid type (HCC) 03/05/2012    Class: Acute  . Schizophrenia (HCC) 02/27/2012  . Hypertension 02/27/2012    Past Surgical History:  Procedure Laterality Date  . ESOPHAGOGASTRODUODENOSCOPY (EGD) WITH PROPOFOL N/A 09/30/2014   Procedure: ESOPHAGOGASTRODUODENOSCOPY (EGD) WITH PROPOFOL;  Surgeon: Vida Rigger, MD;  Location: [facility];  Service: Endoscopy;  Laterality: N/A;  . HIP ARTHROPLASTY Left 03/31/2012   Procedure: ARTHROPLASTY BIPOLAR HIP;  Surgeon: Mable Paris, MD;  Location: [facility];  Service: Orthopedics;  Laterality: Left;  . JOINT REPLACEMENT          Home Medications    Prior to Admission medications   Medication Sig Start Date End Date Taking? Authorizing Provider  atenolol (TENORMIN) 50 MG tablet Take 50 mg by mouth daily.   Yes [provider]  cholecalciferol (VITAMIN D) 1000 units tablet Take 1,000 Units by mouth daily.   Yes [provider]  clonazePAM (KLONOPIN) 0.5 MG tablet Take 0.5 tablets (0.25 mg total) by mouth as directed. Take 0.25 mg scheduled once every evening, and may also take 0.25mg  once in the morning as needed for anxiety. Patient taking differently: Take 0.25 mg by mouth every evening.  10/15/14  Yes Jeralyn Bennett, MD  haloperidol decanoate (HALDOL DECANOATE) 100 MG/ML injection Inject 100 mg into the muscle every 28 (twenty-eight) days.   Yes [provider]  hydrALAZINE (APRESOLINE) 50 MG tablet Take 75 mg by mouth  2 (two) times daily.    Yes [provider]  lisinopril (PRINIVIL,ZESTRIL) 20 MG tablet Take 20 mg by mouth daily.   Yes [provider]  OLANZapine (ZYPREXA) 5 MG tablet Take 5 mg by mouth 2 (two) times daily. And may take 1 additional dose of 5mg  as needed for pychosis   Yes [provider]  spironolactone (ALDACTONE) 25 MG tablet Take  12.5 mg by mouth daily.   Yes [provider]  tamsulosin (FLOMAX) 0.4 MG CAPS capsule Take 0.4 mg by mouth daily.    Yes [provider]  vitamin B-12 1000 MCG tablet Take 1 tablet (1,000 mcg total) by mouth daily. 04/18/12  Yes Viyuoh, Adeline C, MD  morphine (MS CONTIN) 15 MG 12 hr tablet Take 1 tablet (15 mg total) by mouth every 12 (twelve) hours as needed for pain. Patient not taking: Reported on 07/01/2017 10/15/14   Jeralyn Bennett, MD    Family History Family History  Problem Relation Age of Onset  . Diabetes type II Mother   . Hypertension Father     Social History Social History   Tobacco Use  . Smoking status: Never Smoker  . Smokeless tobacco: Never Used  Substance Use Topics  . Alcohol use: No    Comment: not answering  . Drug use: No    Comment: not answering questions     Allergies   Patient has no known allergies.   Review of Systems Review of Systems   Physical Exam Updated Vital Signs BP (!) 156/87 (BP Location: Right Arm)   Pulse 76   Temp 98 F (36.7 C)   Resp (!) 21   SpO2 99%   Physical Exam   ED Treatments / Results  Labs (all labs ordered are listed, but only abnormal results are displayed) Labs Reviewed  COMPREHENSIVE METABOLIC PANEL - Abnormal; Notable for the following components:      Result Value   Potassium 5.9 (*)    Chloride 112 (*)    CO2 9 (*)    Glucose, Bld 147 (*)    BUN 141 (*)    Creatinine, Ser 15.28 (*)    Calcium 8.1 (*)    Albumin 3.4 (*)    AST 6 (*)    ALT <5 (*)    Alkaline Phosphatase 37 (*)    GFR calc non Af Amer 3 (*)    GFR calc Af Amer 3 (*)    Anion gap 20 (*)    All other components within normal limits  CBC WITH DIFFERENTIAL/PLATELET - Abnormal; Notable for the following components:   RBC 3.81 (*)    Hemoglobin 11.0 (*)    HCT 33.3 (*)    Platelets 536 (*)    All other components within normal limits  URINALYSIS, ROUTINE W REFLEX MICROSCOPIC - Abnormal; Notable for the  following components:   Color, Urine STRAW (*)    All other components within normal limits  CBC - Abnormal; Notable for the following components:   RBC 3.59 (*)    Hemoglobin 10.1 (*)    HCT 31.3 (*)    Platelets 514 (*)    All other components within normal limits  COMPREHENSIVE METABOLIC PANEL - Abnormal; Notable for the following components:   Potassium 5.6 (*)    Chloride 113 (*)    CO2 10 (*)    Glucose, Bld 111 (*)    BUN 141 (*)    Creatinine, Ser 15.27 (*)    Calcium  7.9 (*)    Total Protein 6.2 (*)    Albumin 3.1 (*)    AST 6 (*)    ALT 6 (*)    Alkaline Phosphatase 35 (*)    GFR calc non Af Amer 3 (*)    GFR calc Af Amer 3 (*)    Anion gap 18 (*)    All other components within normal limits  HEMOGLOBIN A1C - Abnormal; Notable for the following components:   Hgb A1c MFr Bld 6.4 (*)    All other components within normal limits  COMPREHENSIVE METABOLIC PANEL - Abnormal; Notable for the following components:   Chloride 114 (*)    CO2 12 (*)    Glucose, Bld 117 (*)    BUN 140 (*)    Creatinine, Ser 15.19 (*)    Calcium 7.7 (*)    Total Protein 5.8 (*)    Albumin 2.8 (*)    AST 6 (*)    ALT 6 (*)    Alkaline Phosphatase 32 (*)    GFR calc non Af Amer 3 (*)    GFR calc Af Amer 3 (*)    All other components within normal limits  CBC - Abnormal; Notable for the following components:   RBC 3.32 (*)    Hemoglobin 9.4 (*)    HCT 28.9 (*)    Platelets 492 (*)    All other components within normal limits  OSMOLALITY - Abnormal; Notable for the following components:   Osmolality 340 (*)    All other components within normal limits  GLUCOSE, CAPILLARY - Abnormal; Notable for the following components:   Glucose-Capillary 119 (*)    All other components within normal limits  CBG MONITORING, ED - Abnormal; Notable for the following components:   Glucose-Capillary 130 (*)    All other components within normal limits  URINE CULTURE  HIV ANTIBODY (ROUTINE TESTING)    TSH  SODIUM, URINE, RANDOM  CREATININE, URINE, RANDOM  OSMOLALITY, URINE  GLUCOSE, CAPILLARY  URINALYSIS, ROUTINE W REFLEX MICROSCOPIC  GLUCOSE, CAPILLARY  GLUCOSE, CAPILLARY  CBC WITH DIFFERENTIAL/PLATELET  COMPREHENSIVE METABOLIC PANEL  MAGNESIUM  PHOSPHORUS  VITAMIN B12  FOLATE  IRON AND TIBC  FERRITIN  RETICULOCYTES  URINALYSIS, ROUTINE W REFLEX MICROSCOPIC  RENAL FUNCTION PANEL    EKG None  Radiology US Renal  Result Date: 07/01/2017 CLINICAL DATA:  Elevated creatinine, altered mental status. EXAM: RENAL / URINARY TRACT ULTRASOUND COMPLETE COMPARISON:  CT abdomen pelvis October 10, 2014 FINDINGS: Right Kidney: Length: 12.4 cm. Increased cortical echogenicity. 6.1 x 6.5 x 6.3 cm exophytic upper pole anechoic cyst without septations or solid components. 2.4 x 2.3 x 2.5 cm exophytic interpolar cyst. Small volume perinephric free fluid. Left Kidney: Length: 12.9 cm. Increased cortical echogenicity. 1.3 cm exophytic cyst lower pole left kidney. Bladder: Appears normal for degree of bladder distention. Prostate is 4.9 x 4.1 x 5.4 cm. IMPRESSION: 1. Echogenic kidneys compatible with medical renal disease. No obstructive uropathy. 2. Bilateral renal cysts measuring to 6.5 cm on the right. Electronically Signed   By: Awilda Metro M.D.   On: 07/01/2017 17:39    Procedures Procedures (including critical care time)  CRITICAL CARE Performed by: Arlana Hove Total critical care time: 45  minutes Critical care time was exclusive of separately billable procedures and treating other patients. Critical care was necessary to treat or prevent imminent or life-threatening deterioration. Critical care was time spent personally by me on the following activities: development of treatment plan with  patient and/or surrogate as well as nursing, discussions with consultants, evaluation of patient's response to treatment, examination of patient, obtaining history from patient or  surrogate, ordering and performing treatments and interventions, ordering and review of laboratory studies, ordering and review of radiographic studies, pulse oximetry and re-evaluation of patient's condition.   Medications Ordered in ED Medications  atenolol (TENORMIN) tablet 50 mg (50 mg Oral Given 07/02/17 1134)  clonazePAM (KLONOPIN) disintegrating tablet 0.25 mg (0.25 mg Oral Given 07/02/17 1758)  hydrALAZINE (APRESOLINE) tablet 25 mg (25 mg Oral Given 07/02/17 1758)  OLANZapine (ZYPREXA) tablet 5 mg (5 mg Oral Given 07/02/17 1134)  vitamin B-12 (CYANOCOBALAMIN) tablet 1,000 mcg (1,000 mcg Oral Given 07/02/17 1134)  ondansetron (ZOFRAN) tablet 4 mg (has no administration in time range)  heparin injection 5,000 Units (5,000 Units Subcutaneous Given 07/02/17 1338)  acetaminophen (TYLENOL) tablet 650 mg (has no administration in time range)    Or  acetaminophen (TYLENOL) suppository 650 mg (has no administration in time range)  polyethylene glycol (MIRALAX / GLYCOLAX) packet 17 g (has no administration in time range)  bisacodyl (DULCOLAX) suppository 10 mg (has no administration in time range)  insulin aspart (novoLOG) injection 0-9 Units (0 Units Subcutaneous Not Given 07/02/17 1752)  sodium bicarbonate 150 mEq in dextrose 5 % 1,000 mL infusion ( Intravenous New Bag/Given 07/02/17 2038)  famotidine (PEPCID) tablet 20 mg (has no administration in time range)  sodium chloride 0.9 % bolus 1,000 mL (0 mLs Intravenous Stopped 07/01/17 1709)        Initial Impression / Assessment and Plan / ED Course  I have reviewed the triage vital signs and the nursing notes.  Pertinent labs & imaging results that were available during my care of the patient were reviewed by me and considered in my medical decision making (see chart for details).     Patient is a 67 year old male past medical history significant for schizophrenia, ulcerative esophagitis, and diabetes.  He presented today with his caregiver.   Unfortunately caregiver was not in the room by the time he arrived.  Caregiver brought him here because he was "not eating as much".  Patient had no other symptoms.  When I approached the room patient said to me "when can I eat a sandwich".  Patient is unable to tell me anything additional about his history.    Level 5 caveat baseline confusion.   Will do baseline lab work to make sure that patient is healthy however with normal vital signs normal physical exam will likely be able to return patient to home living situation.   2:31 PM Discussed with patient's caregiver over the phone  She reports that she thinks over the last year he is been losing weight, falling more frequently than usual, and not catching things that she says. I have difficulty understnading the nuancees/details of the progression of symptoms.  She reports that occasionally he is confused and has funny speech.  On my exam patient does know the president or year.  Is able to converse normally with normal speech patterns.   Patient's creatinine is 15.  Baseline is 1.  Likely this is been going on for some time given that patient's potassium is only 5.8 in the setting..  3:03 PM Discussed with Dr. Helyn Numbers on for nephrology.  He recommends renal ultrasound, admission to [facility] hospitalist service.  Final Clinical Impressions(s) / ED Diagnoses   Final diagnoses:  None    ED Discharge Orders    None  Abelino DerrickMackuen, Kaelie Henigan Lyn, MD 07/02/17 2051

## 2017-07-01 NOTE — ED Notes (Signed)
Report given to 66M charge nurse

## 2017-07-01 NOTE — ED Notes (Signed)
Patient keeps saying that he did not want to come here

## 2017-07-01 NOTE — ED Notes (Signed)
Caregiver (friend) [phone]

## 2017-07-01 NOTE — ED Notes (Signed)
Patient eating

## 2017-07-01 NOTE — H&P (Signed)
History and Physical    [name] ZOX:[id number] DOB: 05-Jan-1951 DOA: 07/01/2017  PCP: Florentina Jenny, MD   Patient coming from: Home  Chief Complaint: Loss of Appetite   HPI: David Lang is a 67 y.o. male with medical history significant of paranoid schizophrenia, chronic kidney disease stage III, anemia of chronic disease, diabetes mellitus type 2, hypertension, hepatic steatosis, history of ulcerative esophagitis, history of recurrent falls, history of urinary tract infections and other comorbidities who presented to [facility] emergency room brought in by his caregiver for a loss of appetite.  Caregivers noticed that patient has not eaten in 3 days of last week and has been falling frequently.  Family present at bedside states the patient never this confused and knows 53 languages and is able to converse in [place].  A subjective history is not able to be obtained from the patient due to his confusion and altered mental status so it was obtained from the chart review, as well as family and the EDP.    Per report the caregiver brought the patient into the emergency room because of a loss of appetite, and nephew says that he checks on him intermittently and the patient has been falling more.  Per ED report patient has been losing more weight in the last year following and becoming confused easily with speech disturbances.  Further evaluation work-up revealed that patient had a creatinine of 15 and concern for uremic symptoms so [facility] was called to admit the patient to [facility] and have nephrology consult over there for possible need of dialysis.  ED Course: Had basic blood work done and given 1 L of fluid.  Had a urinalysis done which is negative and a renal ultrasound was ordered however pending to be done.  Review of Systems: As per HPI otherwise 10 point review of systems negative.   Past Medical History:  Diagnosis Date  . Aspiration pneumonia (HCC) 08/21/2013  . Diabetes mellitus without  complication (HCC)   . Hepatic steatosis 10/03/2014  . Hip fracture (HCC) 03/30/2012  . Hypertension   . Mallory-Weiss tear 10/03/2014  . Mental disorder   . Periprosthetic hip fracture--nondisplaced, Left 08/05/2013  . Renal cyst, right   . Schizophrenia (HCC)   . Subcapital fracture of neck of femur (HCC) 03/31/2012  . Ulcerative esophagitis 10/03/2014   Past Surgical History:  Procedure Laterality Date  . ESOPHAGOGASTRODUODENOSCOPY (EGD) WITH PROPOFOL N/A 09/30/2014   Procedure: ESOPHAGOGASTRODUODENOSCOPY (EGD) WITH PROPOFOL;  Surgeon: Vida Rigger, MD;  Location: [facility] ENDOSCOPY;  Service: Endoscopy;  Laterality: N/A;  . HIP ARTHROPLASTY Left 03/31/2012   Procedure: ARTHROPLASTY BIPOLAR HIP;  Surgeon: Mable Paris, MD;  Location: [facility] ORS;  Service: Orthopedics;  Laterality: Left;  . JOINT REPLACEMENT     SOCIAL HISTORY  reports that he has never smoked. He has never used smokeless tobacco. He reports that he does not drink alcohol or use drugs.  ALLERGIES No Known Allergies  Family History  Problem Relation Age of Onset  . Diabetes type II Mother   . Hypertension Father    Prior to Admission medications   Medication Sig Start Date End Date Taking? Authorizing Provider  amLODipine (NORVASC) 10 MG tablet Take 1 tablet (10 mg total) by mouth daily. 04/18/12   Viyuoh, Rolland Bimler, MD  atenolol (TENORMIN) 100 MG tablet Take 1 tablet (100 mg total) by mouth daily. 04/18/12   Viyuoh, Rolland Bimler, MD  atenolol (TENORMIN) 50 MG tablet Take 50 mg by mouth daily.  [provider]  cefUROXime (CEFTIN) 250 MG tablet Take 1 tablet (250 mg total) by mouth 2 (two) times daily with a meal. Patient not taking: Reported on 11/14/2014 10/15/14   Jeralyn Bennett, MD  clonazePAM (KLONOPIN) 0.5 MG tablet Take 0.5 tablets (0.25 mg total) by mouth as directed. Take 0.25 mg scheduled once every evening, and may also take 0.25mg  once in the morning as needed for anxiety. Patient taking differently: Take  0.25 mg by mouth every evening. Take 0.25 mg scheduled once every evening, and may also take 0.25 mg in the morning as needed for anxiety 10/15/14   Jeralyn Bennett, MD  cloNIDine (CATAPRES - DOSED IN MG/24 HR) 0.3 mg/24hr Place 1 patch (0.3 mg total) onto the skin once a week. Patient not taking: Reported on 11/14/2014 04/18/12   Kela Millin, MD  haloperidol (HALDOL) 0.5 MG tablet Take 0.5 mg by mouth every morning.    [provider]  haloperidol (HALDOL) 5 MG tablet Take 5 mg by mouth at bedtime. Take 2.5mg  in the morning and then take 5mg  daily at night    [provider]  haloperidol decanoate (HALDOL DECANOATE) 100 MG/ML injection Inject 100 mg into the muscle every 28 (twenty-eight) days.    [provider]  hydrALAZINE (APRESOLINE) 50 MG tablet Take 25 mg by mouth 3 (three) times daily.     [provider]  lisinopril (PRINIVIL,ZESTRIL) 20 MG tablet Take 20 mg by mouth daily.    [provider]  loperamide (IMODIUM) 2 MG capsule Take 2 mg by mouth 3 (three) times daily as needed for diarrhea or loose stools.    [provider]  morphine (MS CONTIN) 15 MG 12 hr tablet Take 1 tablet (15 mg total) by mouth every 12 (twelve) hours as needed for pain. 10/15/14   Jeralyn Bennett, MD  OLANZapine (ZYPREXA) 5 MG tablet Take 5 mg by mouth 2 (two) times daily. And may take 1 additional dose of 5mg  as needed for pychosis    [provider]  ondansetron (ZOFRAN) 4 MG tablet Take 1 tablet (4 mg total) by mouth every 6 (six) hours as needed for nausea. 04/18/12   Viyuoh, Rolland Bimler, MD  pantoprazole (PROTONIX) 40 MG tablet Take 1 tablet (40 mg total) by mouth 2 (two) times daily. 10/03/14   Rama, Maryruth Bun, MD  spironolactone (ALDACTONE) 25 MG tablet Take 12.5 mg by mouth daily.    [provider]  tamsulosin (FLOMAX) 0.4 MG CAPS capsule Take 0.4 mg by mouth.    [provider]  trihexyphenidyl (ARTANE) 5 MG tablet Take 2.5  mg by mouth daily.     [provider]  vitamin B-12 1000 MCG tablet Take 1 tablet (1,000 mcg total) by mouth daily. 04/18/12   Kela Millin, MD   Physical Exam: Vitals:   07/01/17 1214 07/01/17 1311 07/01/17 1629  BP: 116/66  122/68  Pulse: 98 94 81  Resp: 17  17  Temp: 98 F (36.7 C)    TempSrc: Oral    SpO2: 98%  99%   Constitutional: Slightly disheveled male in NAD and appears agitated and unable to provide a subjective History  Eyes: Lds and conjunctivae normal, sclerae anicteric  ENMT: External Ears, Nose appear normal. Grossly normal hearing.   Neck: Appears normal, supple, no cervical masses, normal ROM, no appreciable thyromegaly; no JVD Respiratory: Diminished to auscultation bilaterally, no wheezing, rales, rhonchi or crackles. Normal respiratory effort and patient is not tachypenic. No  accessory muscle use.  Cardiovascular: RRR, no murmurs / rubs / gallops. S1 and S2 auscultated. Trace LE Edema in Right Compared to Left Abdomen: Soft, Tender to palpate in Lower Abdomen, Slightly distended. No masses palpated. No appreciable hepatosplenomegaly. Bowel sounds positive x4 and slightly hyperactive.  GU: Deferred. Musculoskeletal: No clubbing / cyanosis of digits/nails. No joint deformity upper and lower extremities.  Skin: No rashes, lesions, ulcers on a limited skin evaluation. No induration; Warm and dry.  Neurologic: CN 2-12 grossly intact with no focal deficits.  Romberg sign cerebellar reflexes not assessed.  Psychiatric: Impaired judgment and insight. Alert and awake. Slightly agitated mood and appropriate affect.   Labs on Admission: I have personally reviewed following labs and imaging studies  CBC: Recent Labs  Lab 07/01/17 1334  WBC 8.8  NEUTROABS 5.7  HGB 11.0*  HCT 33.3*  MCV 87.4  PLT 536*   Basic Metabolic Panel: Recent Labs  Lab 07/01/17 1334  NA 141  K 5.9*  CL 112*  CO2 9*  GLUCOSE 147*  BUN 141*  CREATININE 15.28*  CALCIUM 8.1*     GFR: CrCl cannot be calculated (Unknown ideal weight.). Liver Function Tests: Recent Labs  Lab 07/01/17 1334  AST 6*  ALT <5*  ALKPHOS 37*  BILITOT 0.7  PROT 6.7  ALBUMIN 3.4*   No results for input(s): LIPASE, AMYLASE in the last 168 hours. No results for input(s): AMMONIA in the last 168 hours. Coagulation Profile: No results for input(s): INR, PROTIME in the last 168 hours. Cardiac Enzymes: No results for input(s): CKTOTAL, CKMB, CKMBINDEX, TROPONINI in the last 168 hours. BNP (last 3 results) No results for input(s): PROBNP in the last 8760 hours. HbA1C: No results for input(s): HGBA1C in the last 72 hours. CBG: No results for input(s): GLUCAP in the last 168 hours. Lipid Profile: No results for input(s): CHOL, HDL, LDLCALC, TRIG, CHOLHDL, LDLDIRECT in the last 72 hours. Thyroid Function Tests: No results for input(s): TSH, T4TOTAL, FREET4, T3FREE, THYROIDAB in the last 72 hours. Anemia Panel: No results for input(s): VITAMINB12, FOLATE, FERRITIN, TIBC, IRON, RETICCTPCT in the last 72 hours. Urine analysis:    Component Value Date/Time   COLORURINE STRAW (A) 07/01/2017 1446   APPEARANCEUR CLEAR 07/01/2017 1446   LABSPEC 1.005 07/01/2017 1446   PHURINE 7.0 07/01/2017 1446   GLUCOSEU NEGATIVE 07/01/2017 1446   HGBUR NEGATIVE 07/01/2017 1446   BILIRUBINUR NEGATIVE 07/01/2017 1446   KETONESUR NEGATIVE 07/01/2017 1446   PROTEINUR NEGATIVE 07/01/2017 1446   UROBILINOGEN 1.0 10/09/2014 2208   NITRITE NEGATIVE 07/01/2017 1446   LEUKOCYTESUR NEGATIVE 07/01/2017 1446   Sepsis Labs: !!!!!!!!!!!!!!!!!!!!!!!!!!!!!!!!!!!!!!!!!!!! @LABRCNTIP (procalcitonin:4,lacticidven:4) )No results found for this or any previous visit (from the past 240 hour(s)).   Radiological Exams on Admission: No results found.  EKG: Never done in the ED so have ordered one and pending   Assessment/Plan Active Problems:   Schizophrenia (HCC)   Hypertension   Schizophrenia, paranoid type  (HCC)   DM type 2 causing CKD stage 3 (HCC)   Chronic anemia   Acute renal failure superimposed on stage 3 chronic kidney disease (HCC)   Acute encephalopathy   Falls frequently   Ulcerative esophagitis   Acute kidney injury superimposed on chronic kidney disease (HCC)   Hyperkalemia, diminished renal excretion   Metabolic acidosis  Acute kidney injury on chronic kidney disease stage III -Admit to telemetry and renal floor at [facility] -Upon further review patient's baseline creatinine runs anywhere between 1.6 and 2.4 -Patient's creatinine on  admission was 15.28 and BUN was 141 -Unclear etiology of worsening creatinine however likely multifactorial -Check urine sodium and creatinine electrolytes and get urine osmolality as well as serum osmolality -Obtain renal ultrasound -Check bladder scan -Urinalysis done and was negative and did not show any protein, leukocytes, nitrites however will check urine culture -Place Foley catheter for strict I's and O's -Avoid nephrotoxic medications including patient's home medications of lisinopril 20 mg p.o. daily, spironolactone 12.5 mg p.o. daily, tamsulosin 0.4 mg p.o. daily -Given 1 L of normal saline bolus in the ED -Continue maintenance IV fluid at 75 mL's per hour -Nephrology was consulted by EDP and Dr. Gretchen Short recommended transfer to [facility] for further evaluation by nephrology  Acute Encephalopathy -In the setting of Uremia -If patient does not improve we will need to obtain a head CT without contrast -Delirium Precautions  -Continue to Monitor Closely   Hyperkalemia -Patient's potassium was 5.9 likely in the setting of renal failure -EKG never done in the emergency room so I ordered one and is pending -We will give IV fluid resuscitation and expect potassium to improve -If does not improve we will give D50 and insulin along with Kayexalate -Repeat CMP ordered and pending  Metabolic acidosis likely in the setting of  renal failure -Continue with IV fluid hydration as above -Nephrology consulted and appreciate evaluation -Patient may need to be started on sodium bicarbonate tabs -Repeat CMP pending  Normocytic Anemia -Likely of chronic kidney disease -Check anemia panel -Continue monitor for signs and symptoms of bleeding -Patient's hemoglobin/hematocrit is 11.0/33.3 -Repeat CBC in AM  Thrombocytosis -Likely reactive.  Patient's platelet count is 536 -Continue to monitor and repeat CBC in the a.m.  Diabetes Mellitus Type 2 -Last hemoglobin A1c was 7.5 in our system -Repeat hemoglobin A1c at this admission -Placed on sensitive NovoLog sliding scale insulin before meals -Continue monitor CBGs  Essential Hypertension -Continue home atenolol 50 mg p.o. daily, currently hold amlodipine 10 mg p.o. daily and lisinopril 20 mg p.o. daily along with spironolactone 12.5 mg daily -Continue with home hydralazine 25 mg p.o. 3 times daily  Paranoid Schizophrenia -Continue with home medications including clonazepam 0.5 mg p.o. every evening and 0.25 mg once in the morning as needed for anxiety -Continue olanzapine 5 mg p.o. twice daily with 1 additional dose for psychosis -Continue with Trihexyphenidyl 2.5 mg p.o. daily  History of his ulcerative esophagitis -Continue with Pantoprazole 40 mg p.o. twice daily  DVT prophylaxis: Heparin 5,000 units sq q8h Code Status: FULL CODE Family Communication: Discussed with Nephew at bedside Disposition Plan: Pending PT Evaluation Consults called: Nephrology Dr. Darrick Penna  Admission status: Inpatient Telemetry  Severity of Illness: The appropriate patient status for this patient is INPATIENT. Inpatient status is judged to be reasonable and necessary in order to provide the required intensity of service to ensure the patient's safety. The patient's presenting symptoms, physical exam findings, and initial radiographic and laboratory data in the context of their  chronic comorbidities is felt to place them at high risk for further clinical deterioration. Furthermore, it is not anticipated that the patient will be medically stable for discharge from the hospital within 2 midnights of admission. The following factors support the patient status of inpatient.   " The patient's presenting symptoms include AMS and Confusion. " The worrisome physical exam findings include Lower Abdominal Pain. " The initial radiographic and laboratory data are worrisome because of Acute Renal Failure. " The chronic co-morbidities include HTN, DM2, Schizophrenia, Ulcerative Esophagitis.  *  I certify that at the point of admission it is my clinical judgment that the patient will require inpatient hospital care spanning beyond 2 midnights from the point of admission due to high intensity of service, high risk for further deterioration and high frequency of surveillance required.Merlene Laughter*   Marielena Harvell Latif Leiyah Maultsby, D.O. [facility] Pager [phone]  If 7PM-7AM, please contact night-coverage [email or web address] Password [email or web address]  07/01/2017, 4:45 PM

## 2017-07-01 NOTE — ED Triage Notes (Signed)
Patient here from home with complaints of loss of appetite. CBG 155. Uncooperative. Per caregiver patient has not eaten much over the past week. No family.

## 2017-07-02 DIAGNOSIS — Z818 Family history of other mental and behavioral disorders: Secondary | ICD-10-CM

## 2017-07-02 DIAGNOSIS — G4700 Insomnia, unspecified: Secondary | ICD-10-CM

## 2017-07-02 DIAGNOSIS — F419 Anxiety disorder, unspecified: Secondary | ICD-10-CM

## 2017-07-02 LAB — URINALYSIS, ROUTINE W REFLEX MICROSCOPIC
BILIRUBIN URINE: NEGATIVE
Glucose, UA: NEGATIVE mg/dL
Hgb urine dipstick: NEGATIVE
Ketones, ur: NEGATIVE mg/dL
LEUKOCYTES UA: NEGATIVE
NITRITE: NEGATIVE
Protein, ur: NEGATIVE mg/dL
SPECIFIC GRAVITY, URINE: 1.01 (ref 1.005–1.030)
pH: 5 (ref 5.0–8.0)

## 2017-07-02 LAB — GLUCOSE, CAPILLARY
GLUCOSE-CAPILLARY: 82 mg/dL (ref 65–99)
GLUCOSE-CAPILLARY: 80 mg/dL (ref 65–99)
Glucose-Capillary: 95 mg/dL (ref 65–99)
Glucose-Capillary: 119 mg/dL — ABNORMAL HIGH (ref 65–99)

## 2017-07-02 LAB — CBC
HEMATOCRIT: 28.9 % — AB (ref 39.0–52.0)
HEMOGLOBIN: 9.4 g/dL — AB (ref 13.0–17.0)
MCH: 28.3 pg (ref 26.0–34.0)
MCHC: 32.5 g/dL (ref 30.0–36.0)
MCV: 87 fL (ref 78.0–100.0)
Platelets: 492 10*3/uL — ABNORMAL HIGH (ref 150–400)
RBC: 3.32 MIL/uL — ABNORMAL LOW (ref 4.22–5.81)
RDW: 13.6 % (ref 11.5–15.5)
WBC: 7.9 10*3/uL (ref 4.0–10.5)

## 2017-07-02 LAB — COMPREHENSIVE METABOLIC PANEL
ALBUMIN: 2.8 g/dL — AB (ref 3.5–5.0)
ALT: 6 U/L — ABNORMAL LOW (ref 17–63)
ANION GAP: 15 (ref 5–15)
AST: 6 U/L — AB (ref 15–41)
Alkaline Phosphatase: 32 U/L — ABNORMAL LOW (ref 38–126)
BUN: 140 mg/dL — AB (ref 6–20)
CO2: 12 mmol/L — AB (ref 22–32)
Calcium: 7.7 mg/dL — ABNORMAL LOW (ref 8.9–10.3)
Chloride: 114 mmol/L — ABNORMAL HIGH (ref 101–111)
Creatinine, Ser: 15.19 mg/dL — ABNORMAL HIGH (ref 0.61–1.24)
GFR calc Af Amer: 3 mL/min — ABNORMAL LOW (ref >60)
GFR calc non Af Amer: 3 mL/min — ABNORMAL LOW (ref >60)
GLUCOSE: 117 mg/dL — AB (ref 65–99)
POTASSIUM: 4.9 mmol/L (ref 3.5–5.1)
Sodium: 141 mmol/L (ref 135–145)
Total Bilirubin: 0.6 mg/dL (ref 0.3–1.2)
Total Protein: 5.8 g/dL — ABNORMAL LOW (ref 6.5–8.1)

## 2017-07-02 LAB — HIV ANTIBODY (ROUTINE TESTING W REFLEX): HIV Screen 4th Generation wRfx: NONREACTIVE

## 2017-07-02 LAB — OSMOLALITY, URINE: Osmolality, Ur: 330 mOsm/kg (ref 300–900)

## 2017-07-02 LAB — SODIUM, URINE, RANDOM: Sodium, Ur: 64 mmol/L

## 2017-07-02 LAB — CREATININE, URINE, RANDOM: CREATININE, URINE: 82.98 mg/dL

## 2017-07-02 MED ORDER — FAMOTIDINE 20 MG PO TABS
20.0000 mg | ORAL_TABLET | Freq: Every day | ORAL | Status: DC
Start: 2017-07-02 — End: 2017-07-10
  Administered 2017-07-03 – 2017-07-09 (×7): 20 mg via ORAL
  Filled 2017-07-02 (×8): qty 1

## 2017-07-02 MED ORDER — SODIUM BICARBONATE 8.4 % IV SOLN
INTRAVENOUS | Status: DC
  Administered 2017-07-02 – 2017-07-03 (×3): via INTRAVENOUS
  Filled 2017-07-02 (×7): qty 150

## 2017-07-02 MED ORDER — FAMOTIDINE 20 MG PO TABS: 20.0000 mg | ORAL_TABLET | Freq: Two times a day (BID) | ORAL | Status: DC

## 2017-07-02 NOTE — Progress Notes (Signed)
PROGRESS NOTE    David Lang  ZOX:[id number] DOB: 10/11/1950 DOA: 07/01/2017 PCP: Florentina Jenny, MD    Brief Narrative:  Patient is a 67 year old male with history of schizophrenia, CKD stage III, DM Type II, hypertension, hepatic steatosis, and anemia of chronic disease who was admitted 07/01/17 for loss of appetite and increased confusion. His vitals on admission were BP 116/66, Pulse 94, T 5F, Resp 17, and SpO2 98%. His creatinine was elevated to 15.28, BUN 141, anion gap was 20, and potassium was 5.9. His renal ultrasound showed no signs of obstructive uropathy. Nephrology was consulted and he was treated with sodium bicarb IV and responded well. Nephrology states he is a poor candidate for long term hemodialysis due to his underlying schizophrenia.    Assessment & Plan:   Active Problems:   Schizophrenia (HCC)   Hypertension   Schizophrenia, paranoid type (HCC)   DM type 2 causing CKD stage 3 (HCC)   Chronic anemia   Acute renal failure superimposed on stage 3 chronic kidney disease (HCC)   Acute encephalopathy   Falls frequently   Ulcerative esophagitis   Acute kidney injury superimposed on chronic kidney disease (HCC)   Hyperkalemia, diminished renal excretion   Metabolic acidosis  1. Acute Kidney Injury on CKD stage III complicated by hyperkalemia and anion gap acidosis -Recent creatinine values not available, current Cr 15.19 and BUN 140. Nephrology suspects AKI caused by ongoing use of ACEI and spironolactone. Will D/C these medications -Unlikely to be a candidate for chronic hemodialysis due to schizophrenia, nephew will discuss with family to see if patient would be able to comply to dialysis -Hyperkalemia secondary to AKI, potassium down to 4.9 from 5.9 on admission.  -Metabolic acidosis secondary to AKI. Anion gap down to 15 from 20 on admission, CO2 up to 12 from 9. -Continue IV bicarbonate at 125 cc/hr to treat acidosis and hyperkalemia -Repeat BMP tomorrow morning to  monitor kidney function, hyperkalemia, and acidosis  2. Anemia -CBC shows a normocytic anemia. Hemoglobin dropped from 11 on admission to 9.4 this morning. Continue to monitor.  -Repeat CBC tomorrow morning  3. Schizophrenia -Continue Clonazepam .25mg  qhs and Zyprexa 5mg  BID -Psychiatry input appreciated  4. Diabetes Type II -Blood sugar stable in the 95-130 range, continue to monitor -Continue insulin TID with meals  5. Hypertension -BP has been stable in the 120-130s systolic.  -Continue Atenolol 50mg , Lisinopril and spironolactone were D/C in the setting of AKI complicated with hyperkalemia   DVT prophylaxis: Heparin  Code Status: Full Code Family Communication: Nephew at bedside Disposition Plan: Home when improved   Consultants:   Nephrology  Psychiatry  Procedures:     Antimicrobials:      Subjective: Patient is not able to communicate much due to decline in mental status but has slurred speech at baseline according to nephew. Nephew is at bedside and states his uncle normally is able to move around pretty well on his own, but now is weak and sluggish. He reports the patient appears thinner and has a yellow tint to him. He reports the patient can be difficult to understand at baseline but is worse and seems more confused than normal. The patient was able to urinate this morning and is complains of bilateral leg pain.   Objective: Vitals:   07/01/17 1629 07/01/17 1903 07/01/17 2139 07/02/17 0524  BP: 122/68 137/82 125/63 122/60  Pulse: 81 92 90 85  Resp: 17 18 18 18   Temp:  97.8 F (36.6  C) 98.4 F (36.9 C) 98.7 F (37.1 C)  TempSrc:  Oral Oral Oral  SpO2: 99% 100% 100% 100%    Intake/Output Summary (Last 24 hours) at 07/02/2017 1037 Last data filed at 07/02/2017 0600 Gross per 24 hour  Intake 908.33 ml  Output 0 ml  Net 908.33 ml   There were no vitals filed for this visit.  Examination:   General: Patient appears fatigued and speech is difficult  to understand Neurology: Awake, non focal  E ENT: mild pallor, no icterus, oral mucosa moist Cardiovascular: No JVD. S1-S2 present, rhythmic, no gallops, rubs, or murmurs. No lower extremity edema. Pulmonary: vesicular breath sounds bilaterally, adequate air movement, no wheezing, rhonchi or rales. Gastrointestinal. Abdomen flat, no organomegaly, non tender, no rebound or guarding Skin. No rashes Musculoskeletal: no joint deformities     Data Reviewed: I have personally reviewed following labs and imaging studies  CBC: Recent Labs  Lab 07/01/17 1334 07/01/17 1948 07/02/17 0627  WBC 8.8 8.4 7.9  NEUTROABS 5.7  --   --   HGB 11.0* 10.1* 9.4*  HCT 33.3* 31.3* 28.9*  MCV 87.4 87.2 87.0  PLT 536* 514* 492*   Basic Metabolic Panel: Recent Labs  Lab 07/01/17 1334 07/01/17 1948 07/02/17 0627  NA 141 141 141  K 5.9* 5.6* 4.9  CL 112* 113* 114*  CO2 9* 10* 12*  GLUCOSE 147* 111* 117*  BUN 141* 141* 140*  CREATININE 15.28* 15.27* 15.19*  CALCIUM 8.1* 7.9* 7.7*   GFR: CrCl cannot be calculated (Unknown ideal weight.). Liver Function Tests: Recent Labs  Lab 07/01/17 1334 07/01/17 1948 07/02/17 0627  AST 6* 6* 6*  ALT <5* 6* 6*  ALKPHOS 37* 35* 32*  BILITOT 0.7 0.6 0.6  PROT 6.7 6.2* 5.8*  ALBUMIN 3.4* 3.1* 2.8*   No results for input(s): LIPASE, AMYLASE in the last 168 hours. No results for input(s): AMMONIA in the last 168 hours. Coagulation Profile: No results for input(s): INR, PROTIME in the last 168 hours. Cardiac Enzymes: No results for input(s): CKTOTAL, CKMB, CKMBINDEX, TROPONINI in the last 168 hours. BNP (last 3 results) No results for input(s): PROBNP in the last 8760 hours. HbA1C: Recent Labs    07/01/17 1948  HGBA1C 6.4*   CBG: Recent Labs  Lab 07/01/17 1645 07/02/17 0744  GLUCAP 130* 95   Lipid Profile: No results for input(s): CHOL, HDL, LDLCALC, TRIG, CHOLHDL, LDLDIRECT in the last 72 hours. Thyroid Function Tests: Recent Labs     07/01/17 1948  TSH 0.917   Anemia Panel: No results for input(s): VITAMINB12, FOLATE, FERRITIN, TIBC, IRON, RETICCTPCT in the last 72 hours.    Radiology Studies: I have reviewed all of the imaging during this hospital visit personally     Scheduled Meds: . atenolol  50 mg Oral Daily  . clonazePAM  0.25 mg Oral QPM  . heparin  5,000 Units Subcutaneous Q8H  . hydrALAZINE  25 mg Oral TID  . insulin aspart  0-9 Units Subcutaneous TID WC  . OLANZapine  5 mg Oral BID  . pantoprazole  40 mg Oral BID  . cyanocobalamin  1,000 mcg Oral Daily   Continuous Infusions: .  sodium bicarbonate  infusion 1000 mL       LOS: 1 day    Cheri KearnsMorgan Tacara Hadlock, PA-S     [facility] Pager [phone]

## 2017-07-02 NOTE — Progress Notes (Signed)
Patient pulled out his IV and is refusing to have another placed in order to continue IV fluids. MD notified and made aware. Will continue to encourage.

## 2017-07-02 NOTE — Progress Notes (Signed)
Shift  Note      2300   6/6   Patient's  Sister  ,HCPOA   Made aware of his refusal of medications and treatments.Adamantly refusing  Placement of foley cath  And  Will not allow  Connection Of iv fluids.    2345 Marny LowensteinUrsula ,Patient's  Home care giver in to check on patient and assist  With care.He finally took night meds but will not  Have foley placed.   1st contact is HC POA  , [name]  Shammas

## 2017-07-02 NOTE — Consult Note (Signed)
[facility] Face-to-Face Psychiatry Consult   Reason for Consult:  Psychosis  Referring Physician:  Dr. Cathlean Sauer Patient Identification: David Lang MRN:  [id number] Principal Diagnosis: Schizophrenia, paranoid type [facility]) Diagnosis:   Patient Active Problem List   Diagnosis Date Noted  . Acute kidney injury superimposed on chronic kidney disease ([place]) [N17.9, N18.9] 07/01/2017  . Hyperkalemia, diminished renal excretion [E87.5] 07/01/2017  . Metabolic acidosis [G86.7] 07/01/2017  . UTI (lower urinary tract infection) [N39.0] 10/09/2014  . Sepsis secondary to UTI ([place]) [A41.9, N39.0] 10/09/2014  . Mallory-Weiss tear [K22.6] 10/03/2014  . Ulcerative esophagitis [K22.10] 10/03/2014  . Lactic acidosis [E87.2] 10/03/2014  . Hepatic steatosis [K76.0] 10/03/2014  . Hematemesis [K92.0] 09/29/2014  . Hyperammonemia ([place]) [E72.20] 09/29/2014  . Bleeding gastrointestinal [K92.2]   . Falls frequently [R29.6] 08/21/2013  . Acute encephalopathy [G93.40] 08/19/2013  . Acute renal failure superimposed on stage 3 chronic kidney disease ([place]) [N17.9, N18.3] 08/11/2013  . Chronic anemia [D64.9] 04/04/2012  . DM type 2 causing CKD stage 3 ([place]) [Y19.50, N18.3] 03/23/2012  . Incontinence [R32] 03/16/2012  . Schizophrenia, paranoid type ([place]) [F20.0] 03/05/2012    Class: Acute  . Schizophrenia ([place]) [F20.9] 02/27/2012  . Hypertension [I10] 02/27/2012    Total Time spent with patient: 1 hour  Subjective:   David Lang is a 67 y.o. male patient admitted with loss of appetite and found to have an AKI.  HPI:  Per chart review, patient was admitted with loss of appetite and was found to have an AKI on chronic kidney disease stage 3. He was noted to have not eaten in 3 days and frequently falling by his caregivers. Nephew reports that he had had a decline in his mental status. He has slurred speech at baseline but he has been more difficult to understand. He has been lethargic and appears thinner.  Home medications  include Zyprexa 5 mg BID and Klonopin 0.25 mg qhs. He was last seen by psychiatry in 2014. He was accessed for capacity to refuse care in the setting of decompensation related to psychiatric illness. He was deemed to lack capacity.  On interview, David Lang is lethargic and has slurred speech so his nephew who is at bedside provides most of the history.  He reports his uncle was doing relatively well until a recent decline in his medical condition.  He reports that he has been taking his medications.  He does not exhibit behaviors that are concerning for safety to self or others.  He has chronic paranoia that is well managed at this time.  His symptoms sometimes increase a week prior to his Invega injection.  His nephew reports that he has been doing well in the hospital since he has been allowing medical care which is not typical for him.  His legal guardian is his sister.  She was contacted by phone and confirmed his medications.   Past Psychiatric History: Schizophrenia   Risk to Self:  None per nephew.  Risk to Others:  None per nephew.  Prior Inpatient Therapy:  He was hospitalized in 02/2012 for psychosis.   Prior Outpatient Therapy:  He receives monthly Invega injections from a nurse that comes to his home.   Past Medical History:  Past Medical History:  Diagnosis Date  . Aspiration pneumonia ([place]) 08/21/2013  . Diabetes mellitus without complication ([place])   . Hepatic steatosis 10/03/2014  . Hip fracture ([place]) 03/30/2012  . Hypertension   . Mallory-Weiss tear 10/03/2014  . Mental disorder   . Periprosthetic hip  fracture--nondisplaced, Left 08/05/2013  . Renal cyst, right   . Schizophrenia ([place])   . Subcapital fracture of neck of femur ([place]) 03/31/2012  . Ulcerative esophagitis 10/03/2014    Past Surgical History:  Procedure Laterality Date  . ESOPHAGOGASTRODUODENOSCOPY (EGD) WITH PROPOFOL N/A 09/30/2014   Procedure: ESOPHAGOGASTRODUODENOSCOPY (EGD) WITH PROPOFOL;  Surgeon: Clarene Essex, MD;   Location: [facility] ENDOSCOPY;  Service: Endoscopy;  Laterality: N/A;  . HIP ARTHROPLASTY Left 03/31/2012   Procedure: ARTHROPLASTY BIPOLAR HIP;  Surgeon: Nita Sells, MD;  Location: [facility] ORS;  Service: Orthopedics;  Laterality: Left;  . JOINT REPLACEMENT     Family History:  Family History  Problem Relation Age of Onset  . Diabetes type II Mother   . Hypertension Father    Family Psychiatric  History: Niece-schizophrenia.  Social History:  Social History   Substance and Sexual Activity  Alcohol Use No   Comment: not answering     Social History   Substance and Sexual Activity  Drug Use No   Comment: not answering questions    Social History   Socioeconomic History  . Marital status: Single    Spouse name: Not on file  . Number of children: Not on file  . Years of education: Not on file  . Highest education level: Not on file  Occupational History  . Not on file  Social Needs  . Financial resource strain: Not on file  . Food insecurity:    Worry: Not on file    Inability: Not on file  . Transportation needs:    Medical: Not on file    Non-medical: Not on file  Tobacco Use  . Smoking status: Never Smoker  . Smokeless tobacco: Never Used  Substance and Sexual Activity  . Alcohol use: No    Comment: not answering  . Drug use: No    Comment: not answering questions  . Sexual activity: Never    Comment: not cooperative with questions  Lifestyle  . Physical activity:    Days per week: Not on file    Minutes per session: Not on file  . Stress: Not on file  Relationships  . Social connections:    Talks on phone: Not on file    Gets together: Not on file    Attends religious service: Not on file    Active member of club or organization: Not on file    Attends meetings of clubs or organizations: Not on file    Relationship status: Not on file  Other Topics Concern  . Not on file  Social History Narrative  . Not on file   Additional Social History: He is  from [place]. He lives at home alone. He receives help with his ADLs and IADLs from a close friend that comes to his house daily. He does not use alcohol or illicit substances.     Allergies:  No Known Allergies  Labs:  Results for orders placed or performed during the hospital encounter of 07/01/17 (from the past 48 hour(s))  Comprehensive metabolic panel     Status: Abnormal   Collection Time: 07/01/17  1:34 PM  Result Value Ref Range   Sodium 141 135 - 145 mmol/L   Potassium 5.9 (H) 3.5 - 5.1 mmol/L   Chloride 112 (H) 101 - 111 mmol/L   CO2 9 (L) 22 - 32 mmol/L   Glucose, Bld 147 (H) 65 - 99 mg/dL   BUN 141 (H) 6 - 20 mg/dL    Comment: RESULTS  CONFIRMED BY MANUAL DILUTION   Creatinine, Ser 15.28 (H) 0.61 - 1.24 mg/dL   Calcium 8.1 (L) 8.9 - 10.3 mg/dL   Total Protein 6.7 6.5 - 8.1 g/dL   Albumin 3.4 (L) 3.5 - 5.0 g/dL   AST 6 (L) 15 - 41 U/L   ALT <5 (L) 17 - 63 U/L   Alkaline Phosphatase 37 (L) 38 - 126 U/L   Total Bilirubin 0.7 0.3 - 1.2 mg/dL   GFR calc non Af Amer 3 (L) >60 mL/min   GFR calc Af Amer 3 (L) >60 mL/min    Comment: (NOTE) The eGFR has been calculated using the CKD EPI equation. This calculation has not been validated in all clinical situations. eGFR's persistently <60 mL/min signify possible Chronic Kidney Disease.    Anion gap 20 (H) 5 - 15    Comment: Performed at [facility], [place] [address] [place], [place] [address]  CBC with Differential/Platelet     Status: Abnormal   Collection Time: 07/01/17  1:34 PM  Result Value Ref Range   WBC 8.8 4.0 - 10.5 K/uL   RBC 3.81 (L) 4.22 - 5.81 MIL/uL   Hemoglobin 11.0 (L) 13.0 - 17.0 g/dL   HCT 33.3 (L) 39.0 - 52.0 %   MCV 87.4 78.0 - 100.0 fL   MCH 28.9 26.0 - 34.0 pg   MCHC 33.0 30.0 - 36.0 g/dL   RDW 14.2 11.5 - 15.5 %   Platelets 536 (H) 150 - 400 K/uL   Neutrophils Relative % 65 %   Neutro Abs 5.7 1.7 - 7.7 K/uL   Lymphocytes Relative 24 %   Lymphs Abs 2.1 0.7 - 4.0 K/uL    Monocytes Relative 7 %   Monocytes Absolute 0.7 0.1 - 1.0 K/uL   Eosinophils Relative 3 %   Eosinophils Absolute 0.3 0.0 - 0.7 K/uL   Basophils Relative 1 %   Basophils Absolute 0.1 0.0 - 0.1 K/uL    Comment: Performed at [facility], [place] [address], [place], [place] [address]  Urinalysis, Routine w reflex microscopic     Status: Abnormal   Collection Time: 07/01/17  2:46 PM  Result Value Ref Range   Color, Urine STRAW (A) YELLOW   APPearance CLEAR CLEAR   Specific Gravity, Urine 1.005 1.005 - 1.030   pH 7.0 5.0 - 8.0   Glucose, UA NEGATIVE NEGATIVE mg/dL   Hgb urine dipstick NEGATIVE NEGATIVE   Bilirubin Urine NEGATIVE NEGATIVE   Ketones, ur NEGATIVE NEGATIVE mg/dL   Protein, ur NEGATIVE NEGATIVE mg/dL   Nitrite NEGATIVE NEGATIVE   Leukocytes, UA NEGATIVE NEGATIVE    Comment: Performed at [facility] [address], [place], [place] [address]  CBG monitoring, ED     Status: Abnormal   Collection Time: 07/01/17  4:45 PM  Result Value Ref Range   Glucose-Capillary 130 (H) 65 - 99 mg/dL  HIV antibody (Routine Testing)     Status: None   Collection Time: 07/01/17  7:48 PM  Result Value Ref Range   HIV Screen 4th Generation wRfx Non Reactive Non Reactive    Comment: (NOTE) Performed At: [facility] [place], [place] [id number] Rush Farmer MD [id number] Performed at [facility], [place] [address] [place], [place] [address]   CBC     Status: Abnormal   Collection Time: 07/01/17  7:48 PM  Result Value Ref Range   WBC 8.4 4.0 - 10.5 K/uL  RBC 3.59 (L) 4.22 - 5.81 MIL/uL   Hemoglobin 10.1 (L) 13.0 - 17.0 g/dL   HCT 31.3 (L) 39.0 - 52.0 %   MCV 87.2 78.0 - 100.0 fL   MCH 28.1 26.0 - 34.0 pg   MCHC 32.3 30.0 - 36.0 g/dL   RDW 13.8 11.5 - 15.5 %   Platelets 514 (H) 150 - 400 K/uL    Comment: Performed at [facility] [address], [place], [place] [address]  Comprehensive metabolic panel      Status: Abnormal   Collection Time: 07/01/17  7:48 PM  Result Value Ref Range   Sodium 141 135 - 145 mmol/L   Potassium 5.6 (H) 3.5 - 5.1 mmol/L   Chloride 113 (H) 101 - 111 mmol/L   CO2 10 (L) 22 - 32 mmol/L   Glucose, Bld 111 (H) 65 - 99 mg/dL   BUN 141 (H) 6 - 20 mg/dL   Creatinine, Ser 15.27 (H) 0.61 - 1.24 mg/dL   Calcium 7.9 (L) 8.9 - 10.3 mg/dL   Total Protein 6.2 (L) 6.5 - 8.1 g/dL   Albumin 3.1 (L) 3.5 - 5.0 g/dL   AST 6 (L) 15 - 41 U/L   ALT 6 (L) 17 - 63 U/L   Alkaline Phosphatase 35 (L) 38 - 126 U/L   Total Bilirubin 0.6 0.3 - 1.2 mg/dL   GFR calc non Af Amer 3 (L) >60 mL/min   GFR calc Af Amer 3 (L) >60 mL/min    Comment: (NOTE) The eGFR has been calculated using the CKD EPI equation. This calculation has not been validated in all clinical situations. eGFR's persistently <60 mL/min signify possible Chronic Kidney Disease.    Anion gap 18 (H) 5 - 15    Comment: Performed at [facility], [place] [address], [place], [place] [address]  TSH     Status: None   Collection Time: 07/01/17  7:48 PM  Result Value Ref Range   TSH 0.917 0.350 - 4.500 uIU/mL    Comment: Performed by a 3rd Generation assay with a functional sensitivity of <=0.01 uIU/mL. Performed at [facility], [place] [address], [place], [place] [address]   Hemoglobin A1c     Status: Abnormal   Collection Time: 07/01/17  7:48 PM  Result Value Ref Range   Hgb A1c MFr Bld 6.4 (H) 4.8 - 5.6 %    Comment: (NOTE) Pre diabetes:          5.7%-6.4% Diabetes:              >6.4% Glycemic control for   <7.0% adults with diabetes    Mean Plasma Glucose 136.98 mg/dL    Comment: Performed at [facility] [address], [place], [place] [address]  Osmolality     Status: Abnormal   Collection Time: 07/01/17  7:48 PM  Result Value Ref Range   Osmolality 340 (HH) 275 - 295 mOsm/kg    Comment: CRITICAL RESULT CALLED TO, READ BACK BY AND VERIFIED WITHKatheren Puller RN [phone] GREEN R Performed at  [facility] [address] [place], [place] [address]   Comprehensive metabolic panel     Status: Abnormal   Collection Time: 07/02/17  6:27 AM  Result Value Ref Range   Sodium 141 135 - 145 mmol/L   Potassium 4.9 3.5 - 5.1 mmol/L   Chloride 114 (H) 101 - 111 mmol/L   CO2 12 (L) 22 - 32  mmol/L   Glucose, Bld 117 (H) 65 - 99 mg/dL   BUN 140 (H) 6 - 20 mg/dL   Creatinine, Ser 15.19 (H) 0.61 - 1.24 mg/dL   Calcium 7.7 (L) 8.9 - 10.3 mg/dL   Total Protein 5.8 (L) 6.5 - 8.1 g/dL   Albumin 2.8 (L) 3.5 - 5.0 g/dL   AST 6 (L) 15 - 41 U/L   ALT 6 (L) 17 - 63 U/L   Alkaline Phosphatase 32 (L) 38 - 126 U/L   Total Bilirubin 0.6 0.3 - 1.2 mg/dL   GFR calc non Af Amer 3 (L) >60 mL/min   GFR calc Af Amer 3 (L) >60 mL/min    Comment: (NOTE) The eGFR has been calculated using the CKD EPI equation. This calculation has not been validated in all clinical situations. eGFR's persistently <60 mL/min signify possible Chronic Kidney Disease.    Anion gap 15 5 - 15    Comment: Performed at [facility] [address], [place], [place] [address]  CBC     Status: Abnormal   Collection Time: 07/02/17  6:27 AM  Result Value Ref Range   WBC 7.9 4.0 - 10.5 K/uL   RBC 3.32 (L) 4.22 - 5.81 MIL/uL   Hemoglobin 9.4 (L) 13.0 - 17.0 g/dL   HCT 28.9 (L) 39.0 - 52.0 %   MCV 87.0 78.0 - 100.0 fL   MCH 28.3 26.0 - 34.0 pg   MCHC 32.5 30.0 - 36.0 g/dL   RDW 13.6 11.5 - 15.5 %   Platelets 492 (H) 150 - 400 K/uL    Comment: Performed at [facility], [place] [address] [place], [place] [address]  Glucose, capillary     Status: None   Collection Time: 07/02/17  7:44 AM  Result Value Ref Range   Glucose-Capillary 95 65 - 99 mg/dL  Sodium, urine, random     Status: None   Collection Time: 07/02/17 12:10 PM  Result Value Ref Range   Sodium, Ur 64 mmol/L    Comment: Performed at [facility] [address], [place], [place] [address]  Creatinine, urine, random     Status: None    Collection Time: 07/02/17 12:10 PM  Result Value Ref Range   Creatinine, Urine 82.98 mg/dL    Comment: Performed at [facility] [address] [place], [place] [address]  Osmolality, urine     Status: None   Collection Time: 07/02/17 12:10 PM  Result Value Ref Range   Osmolality, Ur 330 300 - 900 mOsm/kg    Comment: Performed at [facility] [address] [place], [place] [address]  Urinalysis, Routine w reflex microscopic     Status: None   Collection Time: 07/02/17 12:10 PM  Result Value Ref Range   Color, Urine YELLOW YELLOW   APPearance CLEAR CLEAR   Specific Gravity, Urine 1.010 1.005 - 1.030   pH 5.0 5.0 - 8.0   Glucose, UA NEGATIVE NEGATIVE mg/dL   Hgb urine dipstick NEGATIVE NEGATIVE   Bilirubin Urine NEGATIVE NEGATIVE   Ketones, ur NEGATIVE NEGATIVE mg/dL   Protein, ur NEGATIVE NEGATIVE mg/dL   Nitrite NEGATIVE NEGATIVE   Leukocytes, UA NEGATIVE NEGATIVE    Comment: Microscopic not done on urines with negative protein, blood, leukocytes, nitrite, or glucose < 500 mg/dL. Performed at [facility], [place] [address] [place], [place] [address]   Glucose, capillary     Status: Abnormal   Collection  Time: 07/02/17 12:55 PM  Result Value Ref Range   Glucose-Capillary 119 (H) 65 - 99 mg/dL    Current Facility-Administered Medications  Medication Dose Route Frequency Provider Last Rate Last Dose  . acetaminophen (TYLENOL) tablet 650 mg  650 mg Oral Q6H PRN Raiford Noble Latif, DO       Or  . acetaminophen (TYLENOL) suppository 650 mg  650 mg Rectal Q6H PRN Raiford Noble Latif, DO      . atenolol (TENORMIN) tablet 50 mg  50 mg Oral Daily Sheikh, Georgina Quint [place], DO   50 mg at 07/02/17 1134  . bisacodyl (DULCOLAX) suppository 10 mg  10 mg Rectal Daily PRN Raiford Noble Latif, DO      . clonazePAM Bobbye [place]) disintegrating tablet 0.25 mg  0.25 mg Oral QPM Sheikh, Georgina Quint [place], DO   0.25 mg at 07/01/17 2359  . heparin injection 5,000 Units  5,000 Units  Subcutaneous Q8H Raiford Noble [place], DO   5,000 Units at 07/02/17 1338  . hydrALAZINE (APRESOLINE) tablet 25 mg  25 mg Oral TID Raiford Noble Latif, DO   25 mg at 07/02/17 1135  . insulin aspart (novoLOG) injection 0-9 Units  0-9 Units Subcutaneous TID WC Raiford Noble [place], DO   1 Units at 07/01/17 1719  . OLANZapine (ZYPREXA) tablet 5 mg  5 mg Oral BID Raiford Noble [place], DO   5 mg at 07/02/17 1134  . ondansetron (ZOFRAN) tablet 4 mg  4 mg Oral Q6H PRN Sheikh, Omair Latif, DO      . pantoprazole (PROTONIX) EC tablet 40 mg  40 mg Oral BID Raiford Noble Latif, DO   40 mg at 07/02/17 1134  . polyethylene glycol (MIRALAX / GLYCOLAX) packet 17 g  17 g Oral Daily PRN Raiford Noble Latif, DO      . sodium bicarbonate 150 mEq in dextrose 5 % 1,000 mL infusion   Intravenous Continuous Elmarie Shiley, MD 150 mL/hr at 07/02/17 1144    . vitamin B-12 (CYANOCOBALAMIN) tablet 1,000 mcg  1,000 mcg Oral Daily Raiford Noble [place], DO   1,000 mcg at 07/02/17 1134    Musculoskeletal: Strength & Muscle Tone: decreased due to physical deconditioning.  Gait & Station: UTA since patient was lying in bed. Patient leans: N/A  Psychiatric Specialty Exam: Physical Exam  Nursing note and vitals reviewed. Constitutional: He appears well-developed and well-nourished.  HENT:  Head: Normocephalic and atraumatic.  Neck: Normal range of motion.  Respiratory: Effort normal.  Neurological: He is alert.  Skin: No rash noted.  Psychiatric: Judgment and thought content normal. His affect is blunt. His speech is slurred. He is slowed. Cognition and memory are impaired.    Review of Systems  Unable to perform ROS: Mental status change    Blood pressure 136/77, pulse 90, temperature 97.7 F (36.5 C), temperature source Oral, resp. rate 20, SpO2 98 %.There is no height or weight on file to calculate BMI.  General Appearance: Fairly Groomed, elderly male, wearing a hospital gown with gray hair and a mustache who is lying in  bed. NAD.   Eye Contact:  Minimal since eyes were closed and falling asleep.   Speech:  Slow and Slurred  Volume:  Decreased  Mood:  Did not state  Affect:  Constricted  Thought Process:  Linear and Descriptions of Associations: Intact  Orientation:  Other:  Oriented to person.  Thought Content:  Logical  Suicidal Thoughts:  UTA since patient was falling asleep throughout interview.  Homicidal Thoughts:  UTA  since patient was falling asleep throughout interview.  Memory:  UTA since patient was falling asleep throughout interview.  Judgement:  Other:  UTA since patient was falling asleep throughout interview.  Insight:  UTA since patient was falling asleep throughout interview.  Psychomotor Activity:  Decreased  Concentration:  Concentration: UTA since patient was falling asleep throughout interview. and Attention Span: UTA since patient was falling asleep throughout interview.  Recall:  UTA since patient was falling asleep throughout interview.  Fund of Knowledge:  UTA since patient was falling asleep throughout interview.  Language:  UTA since patient was falling asleep throughout interview.  Akathisia:  NA  Handed:  Right  AIMS (if indicated):   N/A  Assets:  Housing Social Support  ADL's:  Impaired  Cognition:  Impaired due to AMS.  Sleep:   Okay   Assessment:  David Lang is a 67 y.o. male who was admitted with loss of appetite and found to have an AKI. He has been psychiatrically stable prior to presenting medical condition. He has chronic paranoia and AH that are well managed at this time. He has not demonstrated unsafe behaviors at home. He does not warrant inpatient psychiatric hospitalization at this time and his acute mental status change appears to be secondary to current medical condition.   Treatment Plan Summary: -Continue monthly Invega shot for psychosis that is provided by a nurse that comes to his home. Family is unsure of dose. -Continue Zyprexa 5 mg BID for  psychosis and Klonopin 0.25 mg qhs for anxiety/insomnia. -Patient is psychiatrically cleared. Psychiatry will sign off on patient at this time. Please consult psychiatry again as needed.   Disposition: No evidence of imminent risk to self or others at present.   Patient does not meet criteria for psychiatric inpatient admission.  Faythe Dingwall, DO 07/02/2017 2:34 PM

## 2017-07-02 NOTE — Progress Notes (Signed)
PROGRESS NOTE    David CarmineFuad Lang  UJW:[id number] DOB: 09/11/1950 DOA: 07/01/2017 PCP: Florentina Jennyripp, [place], MD    Brief Narrative:  67 year old male who presented to the hospital due to poor oral intake.  He does have significant past medical history of paranoid schizophrenia, chronic kidney disease stage III, anemia chronic kidney disease, diabetes mellitus type 2, hypertension, and history of ulcerative esophagitis.  Patient was noted to have very poor oral intake for the last 3 days but hospitalization, associated with frequent falls.  By the time of admission he was not able to give any history, all information obtained from his family members at bedside.  On the initial physical examination blood pressure 116/66, heart rate 98, respiratory 17, temperature 98.0, oxygen saturation 98%.  Dry mucous membranes, lungs with diminished breath sounds bilaterally, heart S1-S2 present rhythmic, abdomen soft nontender, no lower extremity edema.  Patient was disoriented and confused.  Sodium 141, potassium 5.9, chloride 112, BUN 9, glucose 147, BUN 141, creatinine 15.2, white count 8.8, hemoglobin 11.0, hematocrit 33.3, platelets 536, serum osmolality 340, with osmolar gap 1. White cell count 8.4, hemoglobin 10.1, hematocrit 31.3, platelets 514.  Urinalysis, specific gravity 1.005, urine sodium 64.  EKG normal sinus rhythm, normal axis, normal intervals.  Poor R wave progression.  Patient was admitted to the hospital with a working diagnosis of acute kidney injury chronic kidney disease, complicated by hyperkalemia, anion gap metabolic acidosis and uremia.    Assessment & Plan:   Active Problems:   Schizophrenia (HCC)   Hypertension   Schizophrenia, paranoid type (HCC)   DM type 2 causing CKD stage 3 (HCC)   Chronic anemia   Acute renal failure superimposed on stage 3 chronic kidney disease (HCC)   Acute encephalopathy   Falls frequently   Ulcerative esophagitis   Acute kidney injury superimposed on chronic  kidney disease (HCC)   Hyperkalemia, diminished renal excretion   Metabolic acidosis   1. AKI on CKD stage 3 with anion gap metabolic acidosis and hyperkalemia. Persistent elevation of serum cr and BUN at 15 and 140, urine output documented as 0, but patient has been making urine. Serum K at 4.9 and bicarbonate at 12. Will continue bicarbonate drip, to correct acidosis. Avoid hypotension or nephrotoxic medications. Follow on nephrology recommendations. Questionable candidate for long term renal replacement therapy.   2.Paranoid Schizophrenia. Patient had episodes of agitation, will continue medical therapy with clonazepam and olanzapine. Follow Psychiatry recommendations.   3. HTN. Will continue blood pressure control with atenolol and hydralazine, will continue to hold on spironolactone and lisinopril.   4. T2DM. Will continue glucose cover and monitoring with insulin sliding scale, patient tolerating po well. Capillary glucose 110, 144, 130, 95, 119.     DVT prophylaxis: heparin   Code Status:  full Family Communication: I spoke with patient's newphew at the bedside and all questions were addressed.  Disposition Plan: pending renal function    Consultants:   Nephrology   Procedures:     Antimicrobials:      Subjective: Patient is not cooperative with interrogation, most information from his nephew at the bedside. Patient has baseline psychiatric symptoms, worsening over the last few weeks.   Objective: Vitals:   07/01/17 1903 07/01/17 2139 07/02/17 0524 07/02/17 1100  BP: 137/82 125/63 122/60 136/77  Pulse: 92 90 85 90  Resp: 18 18 18 20   Temp: 97.8 F (36.6 C) 98.4 F (36.9 C) 98.7 F (37.1 C) 97.7 F (36.5 C)  TempSrc: Oral Oral Oral Oral  SpO2: 100% 100% 100% 98%    Intake/Output Summary (Last 24 hours) at 07/02/2017 1329 Last data filed at 07/02/2017 1130 Gross per 24 hour  Intake 1388.33 ml  Output 350 ml  Net 1038.33 ml   There were no vitals filed for  this visit.  Examination:   General: deconditioned and ill looking appearing.  Neurology: Somnolent but easy to arouse.  E ENT: mild pallor, no icterus, oral mucosa moist Cardiovascular: No JVD. S1-S2 present, rhythmic, no gallops, rubs, or murmurs. Trace lower extremity edema. Pulmonary: decreased breath sounds bilaterally, adequate air movement, no wheezing, rhonchi or rales. Gastrointestinal. Abdomen with no organomegaly, non tender, no rebound or guarding Skin. No rashes Musculoskeletal: no joint deformities     Data Reviewed: I have personally reviewed following labs and imaging studies  CBC: Recent Labs  Lab 07/01/17 1334 07/01/17 1948 07/02/17 0627  WBC 8.8 8.4 7.9  NEUTROABS 5.7  --   --   HGB 11.0* 10.1* 9.4*  HCT 33.3* 31.3* 28.9*  MCV 87.4 87.2 87.0  PLT 536* 514* 492*   Basic Metabolic Panel: Recent Labs  Lab 07/01/17 1334 07/01/17 1948 07/02/17 0627  NA 141 141 141  K 5.9* 5.6* 4.9  CL 112* 113* 114*  CO2 9* 10* 12*  GLUCOSE 147* 111* 117*  BUN 141* 141* 140*  CREATININE 15.28* 15.27* 15.19*  CALCIUM 8.1* 7.9* 7.7*   GFR: CrCl cannot be calculated (Unknown ideal weight.). Liver Function Tests: Recent Labs  Lab 07/01/17 1334 07/01/17 1948 07/02/17 0627  AST 6* 6* 6*  ALT <5* 6* 6*  ALKPHOS 37* 35* 32*  BILITOT 0.7 0.6 0.6  PROT 6.7 6.2* 5.8*  ALBUMIN 3.4* 3.1* 2.8*   No results for input(s): LIPASE, AMYLASE in the last 168 hours. No results for input(s): AMMONIA in the last 168 hours. Coagulation Profile: No results for input(s): INR, PROTIME in the last 168 hours. Cardiac Enzymes: No results for input(s): CKTOTAL, CKMB, CKMBINDEX, TROPONINI in the last 168 hours. BNP (last 3 results) No results for input(s): PROBNP in the last 8760 hours. HbA1C: Recent Labs    07/01/17 1948  HGBA1C 6.4*   CBG: Recent Labs  Lab 07/01/17 1645 07/02/17 0744 07/02/17 1255  GLUCAP 130* 95 119*   Lipid Profile: No results for input(s): CHOL,  HDL, LDLCALC, TRIG, CHOLHDL, LDLDIRECT in the last 72 hours. Thyroid Function Tests: Recent Labs    07/01/17 1948  TSH 0.917   Anemia Panel: No results for input(s): VITAMINB12, FOLATE, FERRITIN, TIBC, IRON, RETICCTPCT in the last 72 hours.    Radiology Studies: I have reviewed all of the imaging during this hospital visit personally     Scheduled Meds: . atenolol  50 mg Oral Daily  . clonazePAM  0.25 mg Oral QPM  . heparin  5,000 Units Subcutaneous Q8H  . hydrALAZINE  25 mg Oral TID  . insulin aspart  0-9 Units Subcutaneous TID WC  . OLANZapine  5 mg Oral BID  . pantoprazole  40 mg Oral BID  . cyanocobalamin  1,000 mcg Oral Daily   Continuous Infusions: .  sodium bicarbonate  infusion 1000 mL 150 mL/hr at 07/02/17 1144     LOS: 1 day        Mauricio Annett Gula, MD [facility] Pager [phone]

## 2017-07-02 NOTE — Progress Notes (Addendum)
Patient ID: Levan Aloia, male   DOB: [birth year], 67 y.o.   MRN: [id number] [facility] Progress Note   Assessment/ Plan:   1.  Acute kidney injury on chronic kidney disease stage III: Without recent creatinine values and number listed for his PCP is not functional.  Suspected to have hemodynamically mediated acute kidney injury in the face of ongoing ACE inhibitor/spironolactone.  Bilateral renal cysts noted without any hydronephrosis.  Willing to undertake placement of IV line with intravenous fluids.  He would be a very poor candidate for chronic hemodialysis with current pattern of refusal of therapy. 2.  Hyperkalemia: Secondary to acute kidney injury/ongoing spironolactone use.  Monitor with intravenous fluids. 3.  Anion gap metabolic acidosis: Secondary to acute kidney injury, begin intravenous fluids. 4.  Anemia: Possibly anemia of chronic disease, denies any overt loss.  Monitor with rehydration. 5.  Schizophrenia: Continue ongoing management.  At this time, suspect mental status is clouded further by azotemia  Subjective:   Reports pain in his legs and feeling as if something is bothering his left eye.  Poor insight into ongoing management and refusing medications/procedures overnight. Brother at bedside has convinced him to have IV access re-started for fluids/medications.    Objective:   BP 122/60 (BP Location: Right Arm)   Pulse 85   Temp 98.7 F (37.1 C) (Oral)   Resp 18   SpO2 100%   Intake/Output Summary (Last 24 hours) at 07/02/2017 1004 Last data filed at 07/02/2017 0600 Gross per 24 hour  Intake 908.33 ml  Output 0 ml  Net 908.33 ml   Weight change:   Physical Exam: Gen: Appears to be comfortable resting in bed, intermittently fidgeting CVS: Pulse regular rhythm, normal rate, S1 and S2 normal Resp: Clear to auscultation, no rales/rhonchi Abd: Soft, flat, nontender Ext: Trace lower extremity edema  Imaging: US Renal  Result Date: 07/01/2017 CLINICAL  DATA:  Elevated creatinine, altered mental status. EXAM: RENAL / URINARY TRACT ULTRASOUND COMPLETE COMPARISON:  CT abdomen pelvis October 10, 2014 FINDINGS: Right Kidney: Length: 12.4 cm. Increased cortical echogenicity. 6.1 x 6.5 x 6.3 cm exophytic upper pole anechoic cyst without septations or solid components. 2.4 x 2.3 x 2.5 cm exophytic interpolar cyst. Small volume perinephric free fluid. Left Kidney: Length: 12.9 cm. Increased cortical echogenicity. 1.3 cm exophytic cyst lower pole left kidney. Bladder: Appears normal for degree of bladder distention. Prostate is 4.9 x 4.1 x 5.4 cm. IMPRESSION: 1. Echogenic kidneys compatible with medical renal disease. No obstructive uropathy. 2. Bilateral renal cysts measuring to 6.5 cm on the right. Electronically Signed   By: Awilda Metro M.D.   On: 07/01/2017 17:39    Labs: BMET Recent Labs  Lab 07/01/17 1334 07/01/17 1948 07/02/17 0627  NA 141 141 141  K 5.9* 5.6* 4.9  CL 112* 113* 114*  CO2 9* 10* 12*  GLUCOSE 147* 111* 117*  BUN 141* 141* 140*  CREATININE 15.28* 15.27* 15.19*  CALCIUM 8.1* 7.9* 7.7*   CBC Recent Labs  Lab 07/01/17 1334 07/01/17 1948 07/02/17 0627  WBC 8.8 8.4 7.9  NEUTROABS 5.7  --   --   HGB 11.0* 10.1* 9.4*  HCT 33.3* 31.3* 28.9*  MCV 87.4 87.2 87.0  PLT 536* 514* 492*    Medications:    . atenolol  50 mg Oral Daily  . clonazePAM  0.25 mg Oral QPM  . heparin  5,000 Units Subcutaneous Q8H  . hydrALAZINE  25 mg Oral TID  . insulin aspart  0-9 Units Subcutaneous TID WC  . OLANZapine  5 mg Oral BID  . pantoprazole  40 mg Oral BID  . cyanocobalamin  1,000 mcg Oral Daily    Zetta BillsJay Shawn Dannenberg, MD 07/02/2017, 10:04 AM

## 2017-07-03 LAB — RENAL FUNCTION PANEL
ALBUMIN: 2.6 g/dL — AB (ref 3.5–5.0)
ANION GAP: 17 — AB (ref 5–15)
BUN: 132 mg/dL — AB (ref 6–20)
CALCIUM: 6.9 mg/dL — AB (ref 8.9–10.3)
CO2: 23 mmol/L (ref 22–32)
CREATININE: 13.62 mg/dL — AB (ref 0.61–1.24)
Chloride: 102 mmol/L (ref 101–111)
GFR calc Af Amer: 4 mL/min — ABNORMAL LOW (ref >60)
GFR calc non Af Amer: 3 mL/min — ABNORMAL LOW (ref >60)
GLUCOSE: 112 mg/dL — AB (ref 65–99)
PHOSPHORUS: 6.2 mg/dL — AB (ref 2.5–4.6)
Potassium: 4 mmol/L (ref 3.5–5.1)
SODIUM: 142 mmol/L (ref 135–145)

## 2017-07-03 LAB — GLUCOSE, CAPILLARY
GLUCOSE-CAPILLARY: 112 mg/dL — AB (ref 65–99)
Glucose-Capillary: 88 mg/dL (ref 65–99)
Glucose-Capillary: 106 mg/dL — ABNORMAL HIGH (ref 65–99)
Glucose-Capillary: 91 mg/dL (ref 65–99)

## 2017-07-03 LAB — URINE CULTURE: Culture: <10000 — AB

## 2017-07-03 MED ORDER — SODIUM CHLORIDE 0.45 % IV SOLN
INTRAVENOUS | Status: DC
  Administered 2017-07-03 – 2017-07-07 (×5): via INTRAVENOUS

## 2017-07-03 NOTE — Progress Notes (Signed)
Patient ID: David Lang, male   DOB: 1950-07-14, 67 y.o.   MRN: [id number] [facility] Progress Note   Assessment/ Plan:   1.  Acute kidney injury on chronic kidney disease stage III: Without recent creatinine values and number listed for his PCP is not functional.  Suspected to have hemodynamically mediated acute kidney injury in the face of ongoing ACE inhibitor/spironolactone.  Bilateral renal cysts noted without any hydronephrosis.  Currently on intravenous fluids but has refused sequential lab draws which again creates significant barriers to his management.  He would not be a candidate for chronic hemodialysis with current pattern of refusal of therapy. 2.  Hyperkalemia: Secondary to acute kidney injury/ongoing spironolactone use.  This was corrected medically-refused labs this morning, reattempt after talking to family members to convince him to. 3.  Anion gap metabolic acidosis: Secondary to acute kidney injury, monitor labs when possible. 4.  Anemia: Possibly anemia of chronic disease, denies any overt loss. 5.  Paranoid schizophrenia: Continue ongoing management.  Initiate input from psychiatry yesterday-he lacks capacity to make his own decisions.  Subjective:   Reports that he is feeling fine.  Refuses blood draw because it is "against international human laws".  Unwilling to consent to labs.   Objective:   BP 126/78 (BP Location: Right Arm)   Pulse 62   Temp 98.1 F (36.7 C) (Oral)   Resp 19   SpO2 98%   Intake/Output Summary (Last 24 hours) at 07/03/2017 1032 Last data filed at 07/03/2017 0500 Gross per 24 hour  Intake 3070 ml  Output 850 ml  Net 2220 ml   Weight change:   Physical Exam: Gen: Appears to be comfortable resting in bed CVS: Pulse regular rhythm, normal rate, S1 and S2 normal Resp: Clear to auscultation, no rales/rhonchi Abd: Soft, flat, nontender Ext: Trace lower extremity edema  Imaging: US Renal  Result Date: 07/01/2017 CLINICAL DATA:   Elevated creatinine, altered mental status. EXAM: RENAL / URINARY TRACT ULTRASOUND COMPLETE COMPARISON:  CT abdomen pelvis October 10, 2014 FINDINGS: Right Kidney: Length: 12.4 cm. Increased cortical echogenicity. 6.1 x 6.5 x 6.3 cm exophytic upper pole anechoic cyst without septations or solid components. 2.4 x 2.3 x 2.5 cm exophytic interpolar cyst. Small volume perinephric free fluid. Left Kidney: Length: 12.9 cm. Increased cortical echogenicity. 1.3 cm exophytic cyst lower pole left kidney. Bladder: Appears normal for degree of bladder distention. Prostate is 4.9 x 4.1 x 5.4 cm. IMPRESSION: 1. Echogenic kidneys compatible with medical renal disease. No obstructive uropathy. 2. Bilateral renal cysts measuring to 6.5 cm on the right. Electronically Signed   By: Awilda Metro M.D.   On: 07/01/2017 17:39    Labs: BMET Recent Labs  Lab 07/01/17 1334 07/01/17 1948 07/02/17 0627  NA 141 141 141  K 5.9* 5.6* 4.9  CL 112* 113* 114*  CO2 9* 10* 12*  GLUCOSE 147* 111* 117*  BUN 141* 141* 140*  CREATININE 15.28* 15.27* 15.19*  CALCIUM 8.1* 7.9* 7.7*   CBC Recent Labs  Lab 07/01/17 1334 07/01/17 1948 07/02/17 0627  WBC 8.8 8.4 7.9  NEUTROABS 5.7  --   --   HGB 11.0* 10.1* 9.4*  HCT 33.3* 31.3* 28.9*  MCV 87.4 87.2 87.0  PLT 536* 514* 492*    Medications:    . atenolol  50 mg Oral Daily  . clonazePAM  0.25 mg Oral QPM  . famotidine  20 mg Oral QHS  . heparin  5,000 Units Subcutaneous Q8H  . hydrALAZINE  25 mg Oral TID  . insulin aspart  0-9 Units Subcutaneous TID WC  . OLANZapine  5 mg Oral BID  . cyanocobalamin  1,000 mcg Oral Daily    Zetta BillsJay Harvey Matlack, MD 07/03/2017, 10:32 AM

## 2017-07-03 NOTE — Progress Notes (Signed)
Patient refusing labs at this time. MD notified and made aware. Will continue to encourage.

## 2017-07-03 NOTE — Progress Notes (Signed)
PROGRESS NOTE    David CarmineFuad Lang  XLK:[id number] DOB: 01/22/1951 DOA: 07/01/2017 PCP: Florentina Jennyripp, [place], MD    Brief Narrative:  67 year old male who presented to the hospital due to poor oral intake.  He does have significant past medical history of paranoid schizophrenia, chronic kidney disease stage III, anemia chronic kidney disease, diabetes mellitus type 2, hypertension, and history of ulcerative esophagitis.  Patient was noted to have very poor oral intake for the last 3 days but hospitalization, associated with frequent falls.  By the time of admission he was not able to give any history, all information obtained from his family members at bedside.  On the initial physical examination blood pressure 116/66, heart rate 98, respiratory 17, temperature 98.0, oxygen saturation 98%.  Dry mucous membranes, lungs with diminished breath sounds bilaterally, heart S1-S2 present rhythmic, abdomen soft nontender, no lower extremity edema.  Patient was disoriented and confused.  Sodium 141, potassium 5.9, chloride 112, BUN 9, glucose 147, BUN 141, creatinine 15.2, white count 8.8, hemoglobin 11.0, hematocrit 33.3, platelets 536, serum osmolality 340, with osmolar gap 1. White cell count 8.4, hemoglobin 10.1, hematocrit 31.3, platelets 514.  Urinalysis, specific gravity 1.005, urine sodium 64.  EKG normal sinus rhythm, normal axis, normal intervals.  Poor R wave progression.  Patient was admitted to the hospital with a working diagnosis of acute kidney injury chronic kidney disease, complicated by hyperkalemia, anion gap metabolic acidosis and uremia     Assessment & Plan:   Principal Problem:   Schizophrenia, paranoid type (HCC) Active Problems:   Schizophrenia (HCC)   Hypertension   DM type 2 causing CKD stage 3 (HCC)   Chronic anemia   Acute renal failure superimposed on stage 3 chronic kidney disease (HCC)   Acute encephalopathy   Falls frequently   Ulcerative esophagitis   Acute kidney injury  superimposed on chronic kidney disease (HCC)   Hyperkalemia, diminished renal excretion   Metabolic acidosis   1. AKI on CKD stage 3 with anion gap metabolic acidosis and hyperkalemia. Renal function with serum cr at 13,6 down from 15, urine output 850 cc over last 24 hours. No family at bedside but suspected poor candidate for chronic HD. K at 4.0 and serum bicarbonate at 23. Will hold on bicarb infusion and continue slow rate hypotonic saline. Will continue to hold on diuretics and nephrotoxic medications. Likely patient will need maintenance diuretic therapy at discharge and close follow up, will consult palliative care before discharge.   2. Paranoid Schizophrenia. Continue with clonazepam and olanzapine. Stable form psychiatric perspective, follow up as outpatient.   3. HTN. Continue with atenolol and hydralazine, blood pressure 126 systolic.   4. T2DM. Glucose cover and monitoring with insulin sliding scale  Capillary glucose 82, 80, 88, 106, 112.   DVT prophylaxis: heparin   Code Status:  full Family Communication: I spoke with patient's newphew at the bedside and all questions were addressed.  Disposition Plan: pending renal function    Consultants:   Nephrology   Procedures:     Antimicrobials:     Subjective: Patient is not very interactive, denies any chest pain or dyspnea, unable to get further information, no family at the bedside.   Objective: Vitals:   07/02/17 2035 07/03/17 0426 07/03/17 0900 07/03/17 1217  BP: (!) 156/87 124/60 126/78 126/78  Pulse: 76 75 62 62  Resp: (!) 21 18 19 19   Temp: 98 F (36.7 C) 98 F (36.7 C) 98.1 F (36.7 C) 98.1 F (36.7 C)  TempSrc:   Oral Oral  SpO2: 99% 96% 98%   Weight:    81 kg (178 lb 9.2 oz)  Height:    6' (1.829 m)    Intake/Output Summary (Last 24 hours) at 07/03/2017 1415 Last data filed at 07/03/2017 1315 Gross per 24 hour  Intake 3545 ml  Output 0 ml  Net 3545 ml   Filed Weights   07/03/17  1217  Weight: 81 kg (178 lb 9.2 oz)    Examination:   General: deconditioned  Neurology: Somnolent but easy to arouse, non focal  E ENT: mild pallor, no icterus, oral mucosa moist Cardiovascular: No JVD. S1-S2 present, rhythmic, no gallops, rubs, or murmurs. Trace lower extremity edema. Pulmonary: vesicular breath sounds bilaterally, adequate air movement, no wheezing, rhonchi or rales. Gastrointestinal. Abdomen with no organomegaly, non tender, no rebound or guarding Skin. No rashes Musculoskeletal: no joint deformities     Data Reviewed: I have personally reviewed following labs and imaging studies  CBC: Recent Labs  Lab 07/01/17 1334 07/01/17 1948 07/02/17 0627  WBC 8.8 8.4 7.9  NEUTROABS 5.7  --   --   HGB 11.0* 10.1* 9.4*  HCT 33.3* 31.3* 28.9*  MCV 87.4 87.2 87.0  PLT 536* 514* 492*   Basic Metabolic Panel: Recent Labs  Lab 07/01/17 1334 07/01/17 1948 07/02/17 0627 07/03/17 1022  NA 141 141 141 142  K 5.9* 5.6* 4.9 4.0  CL 112* 113* 114* 102  CO2 9* 10* 12* 23  GLUCOSE 147* 111* 117* 112*  BUN 141* 141* 140* 132*  CREATININE 15.28* 15.27* 15.19* 13.62*  CALCIUM 8.1* 7.9* 7.7* 6.9*  PHOS  --   --   --  6.2*   GFR: Estimated Creatinine Clearance: 5.8 mL/min (A) (by C-G formula based on SCr of 13.62 mg/dL (H)). Liver Function Tests: Recent Labs  Lab 07/01/17 1334 07/01/17 1948 07/02/17 0627 07/03/17 1022  AST 6* 6* 6*  --   ALT <5* 6* 6*  --   ALKPHOS 37* 35* 32*  --   BILITOT 0.7 0.6 0.6  --   PROT 6.7 6.2* 5.8*  --   ALBUMIN 3.4* 3.1* 2.8* 2.6*   No results for input(s): LIPASE, AMYLASE in the last 168 hours. No results for input(s): AMMONIA in the last 168 hours. Coagulation Profile: No results for input(s): INR, PROTIME in the last 168 hours. Cardiac Enzymes: No results for input(s): CKTOTAL, CKMB, CKMBINDEX, TROPONINI in the last 168 hours. BNP (last 3 results) No results for input(s): PROBNP in the last 8760 hours. HbA1C: Recent Labs     07/01/17 1948  HGBA1C 6.4*   CBG: Recent Labs  Lab 07/02/17 1255 07/02/17 1750 07/02/17 2036 07/03/17 0801 07/03/17 1201  GLUCAP 119* 82 80 88 106*   Lipid Profile: No results for input(s): CHOL, HDL, LDLCALC, TRIG, CHOLHDL, LDLDIRECT in the last 72 hours. Thyroid Function Tests: Recent Labs    07/01/17 1948  TSH 0.917   Anemia Panel: No results for input(s): VITAMINB12, FOLATE, FERRITIN, TIBC, IRON, RETICCTPCT in the last 72 hours.    Radiology Studies: I have reviewed all of the imaging during this hospital visit personally     Scheduled Meds: . atenolol  50 mg Oral Daily  . clonazePAM  0.25 mg Oral QPM  . famotidine  20 mg Oral QHS  . heparin  5,000 Units Subcutaneous Q8H  . hydrALAZINE  25 mg Oral TID  . insulin aspart  0-9 Units Subcutaneous TID WC  . OLANZapine  5  mg Oral BID  . cyanocobalamin  1,000 mcg Oral Daily   Continuous Infusions: .  sodium bicarbonate  infusion 1000 mL 150 mL/hr at 07/03/17 1253     LOS: 2 days        Coralie Keens, MD [facility] Pager [phone]

## 2017-07-04 DIAGNOSIS — Z515 Encounter for palliative care: Secondary | ICD-10-CM

## 2017-07-04 LAB — GLUCOSE, CAPILLARY
GLUCOSE-CAPILLARY: 69 mg/dL (ref 65–99)
GLUCOSE-CAPILLARY: 74 mg/dL (ref 65–99)
GLUCOSE-CAPILLARY: 79 mg/dL (ref 65–99)
Glucose-Capillary: 150 mg/dL — ABNORMAL HIGH (ref 65–99)
Glucose-Capillary: 121 mg/dL — ABNORMAL HIGH (ref 65–99)

## 2017-07-04 LAB — BASIC METABOLIC PANEL
Anion gap: 19 — ABNORMAL HIGH (ref 5–15)
BUN: 125 mg/dL — AB (ref 6–20)
CALCIUM: 6.7 mg/dL — AB (ref 8.9–10.3)
CHLORIDE: 99 mmol/L — AB (ref 101–111)
CO2: 26 mmol/L (ref 22–32)
CREATININE: 13.2 mg/dL — AB (ref 0.61–1.24)
GFR calc non Af Amer: 3 mL/min — ABNORMAL LOW (ref >60)
GFR, EST AFRICAN AMERICAN: 4 mL/min — AB (ref >60)
Glucose, Bld: 74 mg/dL (ref 65–99)
Potassium: 4.4 mmol/L (ref 3.5–5.1)
SODIUM: 144 mmol/L (ref 135–145)

## 2017-07-04 MED ORDER — DEXTROSE 50 % IV SOLN
INTRAVENOUS | Status: AC
  Administered 2017-07-04: 50 mL
  Filled 2017-07-04: qty 50

## 2017-07-04 NOTE — Progress Notes (Signed)
PROGRESS NOTE    David CarmineFuad Mirza  MWU:[id number] DOB: 12/04/1950 DOA: 07/01/2017 PCP: Florentina Jennyripp, Henry, MD    Brief Narrative:  67 year old male who presented to the hospital due to poor oral intake. He does have significant past medical history of paranoid schizophrenia, chronic kidney disease stage III, anemia chronic kidney disease, diabetes mellitus type 2, hypertension, andhistory of ulcerative esophagitis.Patient was noted to have very poor oral intake for the last 3 days but hospitalization, associated with frequent falls.By the time of admission he was not able to give any history, all information obtained from his family members at bedside. On the initial physical examination blood pressure 116/66, heart rate 98, respiratory 17, temperature 98.0, oxygen saturation 98%. Dry mucous membranes, lungs with diminished breath sounds bilaterally, heart S1-S2 present rhythmic, abdomen soft nontender, no lower extremity edema. Patient was disoriented and confused. Sodium 141, potassium 5.9, chloride 112, BUN 9, glucose 147, BUN 141, creatinine 15.2, white count 8.8, hemoglobin 11.0, hematocrit 33.3, platelets 536,serum osmolality 340,with osmolar gap1.White cell count 8.4, hemoglobin 10.1, hematocrit 31.3, platelets 514.Urinalysis, specific gravity 1.005,urine sodium 64.EKG normal sinus rhythm, normal axis, normal intervals. Poor R wave progression.  Patient was admitted to the hospital with a working diagnosis of acute kidney injury chronic kidney disease, complicated by hyperkalemia,anion gap metabolic acidosis and uremia   Assessment & Plan:   Principal Problem:   Schizophrenia, paranoid type (HCC) Active Problems:   Schizophrenia (HCC)   Hypertension   DM type 2 causing CKD stage 3 (HCC)   Chronic anemia   Acute renal failure superimposed on stage 3 chronic kidney disease (HCC)   Acute encephalopathy   Falls frequently   Ulcerative esophagitis   Acute kidney injury  superimposed on chronic kidney disease (HCC)   Hyperkalemia, diminished renal excretion   Metabolic acidosis   1. AKI on CKDstage 3with aniongap metabolic acidosis and hyperkalemia. Patient continue to have oliguria, with 300 cc over last 24 hours, will continue slow rate of half normal saline at 50 ml per hour, serum cr down to 13,5 and BUN to 125. Today patient has been more cooperative and responsive. Will consult physical therapy for evaluation. Will target a serum cr less than 10 before discharge. Will need a close follow up as outpatient.   2. Paranoid Schizophrenia. Today more cooperative, no agitation, will continue with clonazepamandolanzapine.   3. HTN. On atenolol and hydralazine, avoid ace inh or diuretics.   4. T2DM. Contiune with glucose cover and monitoring with insulin sliding scale. Improved po intake with capillary glucose 112, 91, 69, 150, 121.   DVT prophylaxis:heparin Code Status:full Family Communication:no family at the bedside .  Disposition Plan:pending renal function   Consultants:  Nephrology  Procedures:    Antimicrobials  Subjective: Patient has been more cooperative per nursing, less agitated, improved po intake, no nausea or vomiting.   Objective: Vitals:   07/03/17 1217 07/03/17 1700 07/03/17 1956 07/04/17 1016  BP: 126/78 127/73 (!) 158/78 122/63  Pulse: 62 63 (!) 59 61  Resp: 19 20 20 17   Temp: 98.1 F (36.7 C) 98 F (36.7 C) 97.7 F (36.5 C) 98.7 F (37.1 C)  TempSrc: Oral Oral Oral Oral  SpO2:  99% 98% 97%  Weight: 81 kg (178 lb 9.2 oz)     Height: 6' (1.829 m)       Intake/Output Summary (Last 24 hours) at 07/04/2017 1300 Last data filed at 07/04/2017 0915 Gross per 24 hour  Intake 1210 ml  Output 1100 ml  Net  110 ml   Filed Weights   07/03/17 1217  Weight: 81 kg (178 lb 9.2 oz)    Examination:   General: deconditioned  Neurology: Awake and alert, non focal.  E ENT: mild pallor, no icterus,  oral mucosa moist Cardiovascular: No JVD. S1-S2 present, rhythmic, no gallops, rubs, or murmurs. Trace lower extremity edema. Pulmonary: decreased breath sounds bilaterally, adequate air movement, no wheezing, rhonchi or rales. Gastrointestinal. Abdomen with no organomegaly, non tender, no rebound or guarding Skin. No rashes Musculoskeletal: no joint deformities     Data Reviewed: I have personally reviewed following labs and imaging studies  CBC: Recent Labs  Lab 07/01/17 1334 07/01/17 1948 07/02/17 0627  WBC 8.8 8.4 7.9  NEUTROABS 5.7  --   --   HGB 11.0* 10.1* 9.4*  HCT 33.3* 31.3* 28.9*  MCV 87.4 87.2 87.0  PLT 536* 514* 492*   Basic Metabolic Panel: Recent Labs  Lab 07/01/17 1334 07/01/17 1948 07/02/17 0627 07/03/17 1022 07/04/17 0801  NA 141 141 141 142 144  K 5.9* 5.6* 4.9 4.0 4.4  CL 112* 113* 114* 102 99*  CO2 9* 10* 12* 23 26  GLUCOSE 147* 111* 117* 112* 74  BUN 141* 141* 140* 132* 125*  CREATININE 15.28* 15.27* 15.19* 13.62* 13.20*  CALCIUM 8.1* 7.9* 7.7* 6.9* 6.7*  PHOS  --   --   --  6.2*  --    GFR: Estimated Creatinine Clearance: 6 mL/min (A) (by C-G formula based on SCr of 13.2 mg/dL (H)). Liver Function Tests: Recent Labs  Lab 07/01/17 1334 07/01/17 1948 07/02/17 0627 07/03/17 1022  AST 6* 6* 6*  --   ALT <5* 6* 6*  --   ALKPHOS 37* 35* 32*  --   BILITOT 0.7 0.6 0.6  --   PROT 6.7 6.2* 5.8*  --   ALBUMIN 3.4* 3.1* 2.8* 2.6*   No results for input(s): LIPASE, AMYLASE in the last 168 hours. No results for input(s): AMMONIA in the last 168 hours. Coagulation Profile: No results for input(s): INR, PROTIME in the last 168 hours. Cardiac Enzymes: No results for input(s): CKTOTAL, CKMB, CKMBINDEX, TROPONINI in the last 168 hours. BNP (last 3 results) No results for input(s): PROBNP in the last 8760 hours. HbA1C: Recent Labs    07/01/17 1948  HGBA1C 6.4*   CBG: Recent Labs  Lab 07/03/17 1656 07/03/17 2109 07/04/17 0740  07/04/17 1052 07/04/17 1138  GLUCAP 112* 91 69 150* 121*   Lipid Profile: No results for input(s): CHOL, HDL, LDLCALC, TRIG, CHOLHDL, LDLDIRECT in the last 72 hours. Thyroid Function Tests: Recent Labs    07/01/17 1948  TSH 0.917   Anemia Panel: No results for input(s): VITAMINB12, FOLATE, FERRITIN, TIBC, IRON, RETICCTPCT in the last 72 hours.    Radiology Studies: I have reviewed all of the imaging during this hospital visit personally     Scheduled Meds: . atenolol  50 mg Oral Daily  . clonazePAM  0.25 mg Oral QPM  . famotidine  20 mg Oral QHS  . heparin  5,000 Units Subcutaneous Q8H  . hydrALAZINE  25 mg Oral TID  . insulin aspart  0-9 Units Subcutaneous TID WC  . OLANZapine  5 mg Oral BID  . cyanocobalamin  1,000 mcg Oral Daily   Continuous Infusions: . sodium chloride 50 mL/hr at 07/03/17 1809     LOS: 3 days        Yasiel Goyne Annett Gula, MD [facility] Pager [phone]

## 2017-07-04 NOTE — Consult Note (Signed)
Consultation Note Date: 07/04/2017   Patient Name: David Lang  DOB: 10/05/1950  MRN: [id number]  Age / Sex: 67 y.o., male  PCP: Florentina Jenny, MD Referring Physician: Coralie Keens  Reason for Consultation: Establishing goals of care  HPI/Patient Profile: 67 y.o. male  with past medical history of paranoid schizophrenia, HTN, DM, and CKD III, who was admitted on 07/01/2017 with A/CKD with anion gap acidosis and hyperkalemia thought secondary to ACE inhibitor and spironolactone. He has slowly improving SeCr but has been evaluated by nephrology and deemed not a candidate for hemodialysis. Palliative care has been asked to help establish goals.    Clinical Assessment and Goals of Care: Psychiatry consult noted. Patient is not deemed to have capacity for medical decisions. He has been refusing some care. Today, patient was nonsensical in his speech. No family is present.   I called and spoke with patient's sister, David Lang, who lives in [place], [place]. She says that their other sister, David Lang, is patient's legal guardian. David Lang is apparently a physician. I tried calling David Lang but did not reach her.   David Lang says that at baseline, patient lives in a house and has daily caregivers come in to assist with his personal care. Family would be interested in him returning home if possible. She asks about more resources at home. We discussed resources available through [facility] such as PCS vs CAPS. We also discussed hospice involvement. Patient may also be a candidate for the PACE program. It would be beneficial to have a SW consult to clarify resources available.   Will continue to try to reach guardian to clarify goals.   SUMMARY OF RECOMMENDATIONS   Continue supportive care Will try to contact guardian to clarify goals/code status     Primary Diagnoses: Present on Admission: . Acute kidney injury superimposed on  chronic kidney disease (HCC) . Acute renal failure superimposed on stage 3 chronic kidney disease (HCC) . Acute encephalopathy . Chronic anemia . DM type 2 causing CKD stage 3 (HCC) . Hypertension . Schizophrenia, paranoid type (HCC) . Ulcerative esophagitis   I have reviewed the medical record, interviewed the patient and family, and examined the patient. The following aspects are pertinent.  Past Medical History:  Diagnosis Date  . Aspiration pneumonia (HCC) 08/21/2013  . Diabetes mellitus without complication (HCC)   . Hepatic steatosis 10/03/2014  . Hip fracture (HCC) 03/30/2012  . Hypertension   . Mallory-Weiss tear 10/03/2014  . Mental disorder   . Periprosthetic hip fracture--nondisplaced, Left 08/05/2013  . Renal cyst, right   . Schizophrenia (HCC)   . Subcapital fracture of neck of femur (HCC) 03/31/2012  . Ulcerative esophagitis 10/03/2014   Social History   Socioeconomic History  . Marital status: Single    Spouse name: Not on file  . Number of children: Not on file  . Years of education: Not on file  . Highest education level: Not on file  Occupational History  . Not on file  Social Needs  . Financial resource strain: Not on file  .  Food insecurity:    Worry: Not on file    Inability: Not on file  . Transportation needs:    Medical: Not on file    Non-medical: Not on file  Tobacco Use  . Smoking status: Never Smoker  . Smokeless tobacco: Never Used  Substance and Sexual Activity  . Alcohol use: No    Comment: not answering  . Drug use: No    Comment: not answering questions  . Sexual activity: Never    Comment: not cooperative with questions  Lifestyle  . Physical activity:    Days per week: Not on file    Minutes per session: Not on file  . Stress: Not on file  Relationships  . Social connections:    Talks on phone: Not on file    Gets together: Not on file    Attends religious service: Not on file    Active member of club or organization: Not on file     Attends meetings of clubs or organizations: Not on file    Relationship status: Not on file  Other Topics Concern  . Not on file  Social History Narrative  . Not on file   Family History  Problem Relation Age of Onset  . Diabetes type II Mother   . Hypertension Father    Scheduled Meds: . atenolol  50 mg Oral Daily  . clonazePAM  0.25 mg Oral QPM  . famotidine  20 mg Oral QHS  . heparin  5,000 Units Subcutaneous Q8H  . hydrALAZINE  25 mg Oral TID  . insulin aspart  0-9 Units Subcutaneous TID WC  . OLANZapine  5 mg Oral BID  . cyanocobalamin  1,000 mcg Oral Daily   Continuous Infusions: . sodium chloride 50 mL/hr at 07/04/17 1335   PRN Meds:.acetaminophen **OR** acetaminophen, bisacodyl, ondansetron, polyethylene glycol Medications Prior to Admission:  Prior to Admission medications   Medication Sig Start Date End Date Taking? Authorizing Provider  atenolol (TENORMIN) 50 MG tablet Take 50 mg by mouth daily.   Yes [provider]  cholecalciferol (VITAMIN D) 1000 units tablet Take 1,000 Units by mouth daily.   Yes [provider]  clonazePAM (KLONOPIN) 0.5 MG tablet Take 0.5 tablets (0.25 mg total) by mouth as directed. Take 0.25 mg scheduled once every evening, and may also take 0.25mg  once in the morning as needed for anxiety. Patient taking differently: Take 0.25 mg by mouth every evening.  10/15/14  Yes Jeralyn BennettZamora, Ezequiel, MD  haloperidol decanoate (HALDOL DECANOATE) 100 MG/ML injection Inject 100 mg into the muscle every 28 (twenty-eight) days.   Yes [provider]  hydrALAZINE (APRESOLINE) 50 MG tablet Take 75 mg by mouth 2 (two) times daily.    Yes [provider]  lisinopril (PRINIVIL,ZESTRIL) 20 MG tablet Take 20 mg by mouth daily.   Yes [provider]  OLANZapine (ZYPREXA) 5 MG tablet Take 5 mg by mouth 2 (two) times daily. And may take 1 additional dose of 5mg  as needed for pychosis   Yes [provider]    spironolactone (ALDACTONE) 25 MG tablet Take 12.5 mg by mouth daily.   Yes [provider]  tamsulosin (FLOMAX) 0.4 MG CAPS capsule Take 0.4 mg by mouth daily.    Yes [provider]  vitamin B-12 1000 MCG tablet Take 1 tablet (1,000 mcg total) by mouth daily. 04/18/12  Yes Viyuoh, Adeline C, MD  morphine (MS CONTIN) 15 MG 12 hr tablet Take 1 tablet (15 mg total)  by mouth every 12 (twelve) hours as needed for pain. Patient not taking: Reported on 07/01/2017 10/15/14   Jeralyn Bennett, MD   No Known Allergies Review of Systems  Unable to perform ROS   Physical Exam: patient refused exam  Vital Signs: BP 122/63 (BP Location: Right Arm)   Pulse 61   Temp 98.7 F (37.1 C) (Oral)   Resp 17   Ht 6' (1.829 m)   Wt 81 kg (178 lb 9.2 oz)   SpO2 97%   BMI 24.22 kg/m  Pain Scale: 0-10   Pain Score: Asleep   SpO2: SpO2: 97 % O2 Device:SpO2: 97 % O2 Flow Rate: .   IO: Intake/output summary:   Intake/Output Summary (Last 24 hours) at 07/04/2017 1430 Last data filed at 07/04/2017 0915 Gross per 24 hour  Intake 1022.5 ml  Output 1100 ml  Net -77.5 ml    LBM: Last BM Date: 07/02/17 Baseline Weight: Weight: 81 kg (178 lb 9.2 oz) Most recent weight: Weight: 81 kg (178 lb 9.2 oz)     Palliative Assessment/Data:   Flowsheet Rows     Most Recent Value  Intake Tab  Referral Department  Hospitalist  Unit at Time of Referral  Med/Surg Unit  Date Notified  07/03/17  Palliative Care Type  New Palliative care  Reason for referral  Clarify Goals of Care  Date of Admission  07/01/17  Date first seen by Palliative Care  07/04/17  # of days Palliative referral response time  1 Day(s)  # of days IP prior to Palliative referral  2  Clinical Assessment  Psychosocial & Spiritual Assessment  Palliative Care Outcomes      Time In: 1400 Time Out: 1430 Time Total: 30 minutes Greater than 50%  of this time was spent counseling and coordinating care related to the above  assessment and plan.  Signed by: Malachy Moan, NP   Please contact Palliative Medicine Team phone at [phone] for questions and concerns.  For individual provider: See Loretha Stapler

## 2017-07-04 NOTE — Progress Notes (Signed)
Patient ID: David Lang, male   DOB: 11/29/1950, 67 y.o.   MRN: [id number] [facility] Progress Note   Assessment/ Plan:   1.  Acute kidney injury on chronic kidney disease stage III: Recent renal baseline function unclear due to paucity of data and inability to reach PCP.  Suspected to have hemodynamically mediated acute kidney injury in the face of ongoing ACE inhibitor/spironolactone.  Bilateral renal cysts noted without any hydronephrosis.  Fair urine output with some improvement of renal function noted on labs-he has been declining therapies/labs that post barriers to his ongoing management.  Based on his current assessment, he would not be a candidate for chronic outpatient hemodialysis. 2.  Hyperkalemia: Secondary to acute kidney injury/ongoing spironolactone use.  This was successfully corrected medically. 3.  Anion gap metabolic acidosis: Secondary to acute kidney injury, corrected with fluids and currently on half-normal saline. 4.  Anemia: Possibly anemia of chronic disease, denies any overt loss. 5.  Paranoid schizophrenia: Continue ongoing management.  Initiate input from psychiatry yesterday-he lacks capacity to make his own decisions.  Subjective:   He states that he is unsure of how he feels today.  He initially responded exclusively in Arabic and was successfully redirected.   Objective:   BP (!) 158/78 (BP Location: Right Arm)   Pulse (!) 59   Temp 97.7 F (36.5 C) (Oral)   Resp 20   Ht 6' (1.829 m)   Wt 81 kg (178 lb 9.2 oz)   SpO2 98%   BMI 24.22 kg/m   Intake/Output Summary (Last 24 hours) at 07/04/2017 1006 Last data filed at 07/04/2017 0915 Gross per 24 hour  Intake 2260 ml  Output 1100 ml  Net 1160 ml   Weight change:   Physical Exam: Gen: Appears to be comfortable resting in bed, remains disoriented CVS: Pulse regular rhythm, normal rate, S1 and S2 normal Resp: Clear to auscultation, no rales/rhonchi Abd: Soft, flat, nontender Ext: Trace lower  extremity edema  Imaging: No results found.  Labs: BMET Recent Labs  Lab 07/01/17 1334 07/01/17 1948 07/02/17 0627 07/03/17 1022 07/04/17 0801  NA 141 141 141 142 144  K 5.9* 5.6* 4.9 4.0 4.4  CL 112* 113* 114* 102 99*  CO2 9* 10* 12* 23 26  GLUCOSE 147* 111* 117* 112* 74  BUN 141* 141* 140* 132* 125*  CREATININE 15.28* 15.27* 15.19* 13.62* 13.20*  CALCIUM 8.1* 7.9* 7.7* 6.9* 6.7*  PHOS  --   --   --  6.2*  --    CBC Recent Labs  Lab 07/01/17 1334 07/01/17 1948 07/02/17 0627  WBC 8.8 8.4 7.9  NEUTROABS 5.7  --   --   HGB 11.0* 10.1* 9.4*  HCT 33.3* 31.3* 28.9*  MCV 87.4 87.2 87.0  PLT 536* 514* 492*    Medications:    . atenolol  50 mg Oral Daily  . clonazePAM  0.25 mg Oral QPM  . famotidine  20 mg Oral QHS  . heparin  5,000 Units Subcutaneous Q8H  . hydrALAZINE  25 mg Oral TID  . insulin aspart  0-9 Units Subcutaneous TID WC  . OLANZapine  5 mg Oral BID  . cyanocobalamin  1,000 mcg Oral Daily    Zetta BillsJay Simone Tuckey, MD 07/04/2017, 10:06 AM

## 2017-07-05 LAB — BASIC METABOLIC PANEL
Anion gap: 23 — ABNORMAL HIGH (ref 5–15)
BUN: 120 mg/dL — AB (ref 6–20)
CHLORIDE: 97 mmol/L — AB (ref 101–111)
CO2: 21 mmol/L — AB (ref 22–32)
CREATININE: 13.03 mg/dL — AB (ref 0.61–1.24)
Calcium: 6.5 mg/dL — ABNORMAL LOW (ref 8.9–10.3)
GFR calc non Af Amer: 3 mL/min — ABNORMAL LOW (ref >60)
GFR, EST AFRICAN AMERICAN: 4 mL/min — AB (ref >60)
Glucose, Bld: 60 mg/dL — ABNORMAL LOW (ref 65–99)
Potassium: 4.2 mmol/L (ref 3.5–5.1)
Sodium: 141 mmol/L (ref 135–145)

## 2017-07-05 LAB — GLUCOSE, CAPILLARY
GLUCOSE-CAPILLARY: 70 mg/dL (ref 65–99)
GLUCOSE-CAPILLARY: 134 mg/dL — AB (ref 65–99)
GLUCOSE-CAPILLARY: 152 mg/dL — AB (ref 65–99)
Glucose-Capillary: 53 mg/dL — ABNORMAL LOW (ref 65–99)
Glucose-Capillary: 92 mg/dL (ref 65–99)

## 2017-07-05 MED ORDER — SOD CITRATE-CITRIC ACID 500-334 MG/5ML PO SOLN
30.0000 mL | Freq: Three times a day (TID) | ORAL | Status: DC
  Administered 2017-07-05 – 2017-07-10 (×8): 30 mL via ORAL
  Filled 2017-07-05 (×22): qty 30

## 2017-07-05 MED ORDER — HYDRALAZINE HCL 50 MG PO TABS
50.0000 mg | ORAL_TABLET | Freq: Two times a day (BID) | ORAL | Status: DC
  Administered 2017-07-05 – 2017-07-10 (×9): 50 mg via ORAL
  Filled 2017-07-05 (×10): qty 1

## 2017-07-05 MED ORDER — CALCIUM ACETATE (PHOS BINDER) 667 MG PO CAPS
1334.0000 mg | ORAL_CAPSULE | Freq: Three times a day (TID) | ORAL | Status: DC
  Administered 2017-07-08 – 2017-07-10 (×6): 1334 mg via ORAL
  Filled 2017-07-05 (×11): qty 2

## 2017-07-05 NOTE — Progress Notes (Addendum)
Admit: 07/01/2017 LOS: 4  15M with AKI likely hypovolemia + ACEi/MRB; SZP  Subjective:  Further inc in UOP in past 24h SCr + BUN a tad better, K in 4s  NO c/o this AM Seen by palliative care   06/09 0701 - 06/10 0700 In: 1515 [P.O.:315; I.V.:1200] Out: 1300 [Urine:1300]  Filed Weights   07/03/17 1217  Weight: 81 kg (178 lb 9.2 oz)    Scheduled Meds: . atenolol  50 mg Oral Daily  . clonazePAM  0.25 mg Oral QPM  . famotidine  20 mg Oral QHS  . heparin  5,000 Units Subcutaneous Q8H  . hydrALAZINE  25 mg Oral TID  . insulin aspart  0-9 Units Subcutaneous TID WC  . OLANZapine  5 mg Oral BID  . cyanocobalamin  1,000 mcg Oral Daily   Continuous Infusions: . sodium chloride 50 mL/hr at 07/05/17 1016   PRN Meds:.acetaminophen **OR** acetaminophen, bisacodyl, ondansetron, polyethylene glycol  Current Labs: reviewed    Physical Exam:  Blood pressure 136/71, pulse 80, temperature 98.3 F (36.8 C), temperature source Oral, resp. rate 18, height 6' (1.829 m), weight 81 kg (178 lb 9.2 oz), SpO2 97 %. NAD Lying in bed RRR Nl s1s2 No sig LEE NCAT EOMI  A 1. AKI, increasing UOP, stable to slightly improved GFR; no indicato nfor HD and not a chronic HD candidate 2. Chronic SZP, seen by psychiatry 3. HTN 4. DM2  P 1. Hopefully is recovering renal function 2. No further suggestions, will cont to monitor 3. Daily weights, Daily Renal Panel, Strict I/Os, Avoid nephrotoxins (NSAIDs, judicious IV Contrast)    Sabra Heckyan Teruko Joswick MD 07/05/2017, 10:20 AM  Recent Labs  Lab 07/03/17 1022 07/04/17 0801 07/05/17 0416  NA 142 144 141  K 4.0 4.4 4.2  CL 102 99* 97*  CO2 23 26 21*  GLUCOSE 112* 74 60*  BUN 132* 125* 120*  CREATININE 13.62* 13.20* 13.03*  CALCIUM 6.9* 6.7* 6.5*  PHOS 6.2*  --   --    Recent Labs  Lab 07/01/17 1334 07/01/17 1948 07/02/17 0627  WBC 8.8 8.4 7.9  NEUTROABS 5.7  --   --   HGB 11.0* 10.1* 9.4*  HCT 33.3* 31.3* 28.9*  MCV 87.4 87.2 87.0  PLT 536*  514* 492*

## 2017-07-05 NOTE — Progress Notes (Signed)
PROGRESS NOTE    David Lang  ZOX:[id number] DOB: 06/17/1950 DOA: 07/01/2017 PCP: Florentina Jennyripp, [place], MD    Brief Narrative:  67 year old male who presented to the hospital due to poor oral intake. He does have significant past medical history of paranoid schizophrenia, chronic kidney disease stage III, anemia chronic kidney disease, diabetes mellitus type 2, hypertension, andhistory of ulcerative esophagitis.Patient was noted to have very poor oral intake for the last 3 days but hospitalization, associated with frequent falls.By the time of admission he was not able to give any history, all information obtained from his family members at bedside. On the initial physical examination blood pressure 116/66, heart rate 98, respiratory 17, temperature 98.0, oxygen saturation 98%. Dry mucous membranes, lungs with diminished breath sounds bilaterally, heart S1-S2 present rhythmic, abdomen soft nontender, no lower extremity edema. Patient was disoriented and confused. Sodium 141, potassium 5.9, chloride 112, BUN 9, glucose 147, BUN 141, creatinine 15.2, white count 8.8, hemoglobin 11.0, hematocrit 33.3, platelets 536,serum osmolality 340,with osmolar gap1.White cell count 8.4, hemoglobin 10.1, hematocrit 31.3, platelets 514.Urinalysis, specific gravity 1.005,urine sodium 64.EKG normal sinus rhythm, normal axis, normal intervals. Poor R wave progression.  Patient was admitted to the hospital with a working diagnosis of acute kidney injury chronic kidney disease, complicated by hyperkalemia,anion gap metabolic acidosis and uremia   Assessment & Plan:   Principal Problem:   Schizophrenia, paranoid type (HCC) Active Problems:   Schizophrenia (HCC)   Hypertension   DM type 2 causing CKD stage 3 (HCC)   Chronic anemia   Acute renal failure superimposed on stage 3 chronic kidney disease (HCC)   Acute encephalopathy   Falls frequently   Ulcerative esophagitis   Acute kidney injury  superimposed on chronic kidney disease (HCC)   Hyperkalemia, diminished renal excretion   Metabolic acidosis   Palliative care encounter    1. AKI on CKDstage 3with aniongap metabolic acidosis and hyperkalemia.Urine output has improved to 1,300 cc over last 24 hours, will continue slow rate of hypotonic saline, patient's po intake has improved, serum cr has been stable at 13 with K at 4,2 and serum bicarbonate at 21. Will add oral bicitra to keep bicarb above 22. Phos at 6,2, will start patient on phosphate binders. Serum ca at 6,7.   2. Paranoid Schizophrenia. responding questions, seems to be confused but no agitation, will continue medical therapy withclonazepamandolanzapine.  3. HTN.Systolic blood pressure 136 systolic will continue withatenolol and hydralazine, continue to hold on nephrotoxic medications and ace inh. Will change hydralazine to 50 mg po bid to improve compliance.    4. T2DM.Capillary glucose has been low at 74, 79, 53, 70 and 134, will contiune glucose cover and monitoring with insulin sliding scale. Continue to encourage po intake.   DVT prophylaxis:heparin Code Status:full Family Communication:no family at the bedside .  Disposition Plan:pending renal function   Consultants:  Nephrology  Procedures:    Antimicrobials   Subjective: Patient seems to be confused this am, not agitation, has been placed on mittens. Able to answer simple questions, no nausea or vomiting, no dyspnea or chest pain.   Objective: Vitals:   07/04/17 2057 07/05/17 0449 07/05/17 0855 07/05/17 1018  BP: (!) 146/82 (!) 148/77 136/71   Pulse: 64 63 60 80  Resp: 20 18 18    Temp: 98.4 F (36.9 C) 98.4 F (36.9 C) 98.3 F (36.8 C)   TempSrc: Oral Oral Oral   SpO2: 97% 96% 97%   Weight:      Height:  Intake/Output Summary (Last 24 hours) at 07/05/2017 1234 Last data filed at 07/05/2017 0900 Gross per 24 hour  Intake 1335 ml  Output 500 ml   Net 835 ml   Filed Weights   07/03/17 1217  Weight: 81 kg (178 lb 9.2 oz)    Examination:   General: deconditioned  Neurology: Awake and alert, non focal / confused.  E ENT: mild pallor, no icterus, oral mucosa moist Cardiovascular: No JVD. S1-S2 present, rhythmic, no gallops, rubs, or murmurs. Trace lower extremity edema. Pulmonary: vesicular breath sounds bilaterally, adequate air movement, no wheezing, rhonchi or rales. Gastrointestinal. Abdomen protuberant with no organomegaly, non tender, no rebound or guarding Skin. No rashes Musculoskeletal: no joint deformities     Data Reviewed: I have personally reviewed following labs and imaging studies  CBC: Recent Labs  Lab 07/01/17 1334 07/01/17 1948 07/02/17 0627  WBC 8.8 8.4 7.9  NEUTROABS 5.7  --   --   HGB 11.0* 10.1* 9.4*  HCT 33.3* 31.3* 28.9*  MCV 87.4 87.2 87.0  PLT 536* 514* 492*   Basic Metabolic Panel: Recent Labs  Lab 07/01/17 1948 07/02/17 0627 07/03/17 1022 07/04/17 0801 07/05/17 0416  NA 141 141 142 144 141  K 5.6* 4.9 4.0 4.4 4.2  CL 113* 114* 102 99* 97*  CO2 10* 12* 23 26 21*  GLUCOSE 111* 117* 112* 74 60*  BUN 141* 140* 132* 125* 120*  CREATININE 15.27* 15.19* 13.62* 13.20* 13.03*  CALCIUM 7.9* 7.7* 6.9* 6.7* 6.5*  PHOS  --   --  6.2*  --   --    GFR: Estimated Creatinine Clearance: 6 mL/min (A) (by C-G formula based on SCr of 13.03 mg/dL (H)). Liver Function Tests: Recent Labs  Lab 07/01/17 1334 07/01/17 1948 07/02/17 0627 07/03/17 1022  AST 6* 6* 6*  --   ALT <5* 6* 6*  --   ALKPHOS 37* 35* 32*  --   BILITOT 0.7 0.6 0.6  --   PROT 6.7 6.2* 5.8*  --   ALBUMIN 3.4* 3.1* 2.8* 2.6*   No results for input(s): LIPASE, AMYLASE in the last 168 hours. No results for input(s): AMMONIA in the last 168 hours. Coagulation Profile: No results for input(s): INR, PROTIME in the last 168 hours. Cardiac Enzymes: No results for input(s): CKTOTAL, CKMB, CKMBINDEX, TROPONINI in the last 168  hours. BNP (last 3 results) No results for input(s): PROBNP in the last 8760 hours. HbA1C: No results for input(s): HGBA1C in the last 72 hours. CBG: Recent Labs  Lab 07/04/17 1647 07/04/17 2056 07/05/17 0807 07/05/17 0848 07/05/17 1125  GLUCAP 74 79 53* 70 134*   Lipid Profile: No results for input(s): CHOL, HDL, LDLCALC, TRIG, CHOLHDL, LDLDIRECT in the last 72 hours. Thyroid Function Tests: No results for input(s): TSH, T4TOTAL, FREET4, T3FREE, THYROIDAB in the last 72 hours. Anemia Panel: No results for input(s): VITAMINB12, FOLATE, FERRITIN, TIBC, IRON, RETICCTPCT in the last 72 hours.    Radiology Studies: I have reviewed all of the imaging during this hospital visit personally     Scheduled Meds: . atenolol  50 mg Oral Daily  . clonazePAM  0.25 mg Oral QPM  . famotidine  20 mg Oral QHS  . heparin  5,000 Units Subcutaneous Q8H  . hydrALAZINE  25 mg Oral TID  . insulin aspart  0-9 Units Subcutaneous TID WC  . OLANZapine  5 mg Oral BID  . cyanocobalamin  1,000 mcg Oral Daily   Continuous Infusions: . sodium chloride 50  mL/hr at 07/05/17 1016     LOS: 4 days        Mauricio Annett Gula, MD [facility] Pager [phone]

## 2017-07-05 NOTE — Progress Notes (Addendum)
CBG 53, pt is alert and talking to RN. Pt has eaten breakfast and given snacks. Will recheck.  CBG 134. Will continue to monitor.   Leonia ReevesNatalie N Shelbie Franken, RN

## 2017-07-05 NOTE — Care Management Important Message (Signed)
Important Message  Patient Details  Name: David CarmineFuad Montini MRN: [id number] Date of Birth: 01/14/1951  Due to illness patient can not sign. Medicare Important Message Given:  No dDue  Shanira Tine 07/05/2017, 3:11 PM

## 2017-07-05 NOTE — Progress Notes (Signed)
Patient refused to eat dinner and take 1800 scheduled medications. Will continue to monitor.    David ReevesNatalie N [name], RN

## 2017-07-05 NOTE — Progress Notes (Signed)
Patient ID: David CarmineFuad Gatta, male   DOB: 05/06/1950, 67 y.o.   MRN: [id number]  This NP visited patient at the bedside at request of case manager RN direction on discharge plan.  Patient is CKD and not a dialysis candidate.  It will be important to clarify GOC with HPOA along with  a discharge plan.  I beleive this patient is hospice eligible.  This is a complicated situation and that the patient does not have capacity and healthcare power of attorney [place] in [place].  Nurse practitioner with the palliative medicine team attempted to contact healthcare power of attorney yesterday without success.  Today I too placed a call and left a message to patient's legal guardian and await callback.  I also placed a call and left a message with local caregiver/ [phone] and have not heard back yet either.  [facility] case manager and await call back.   This is a disposition issue.     Total time spent on the unit was 35 minutes  Greater than 50% of the time was spent in counseling and coordination of care  Lorinda CreedMary Jermain Curt NP  Palliative Medicine Team Team Phone # [phone] Pager [phone]

## 2017-07-05 NOTE — Care Management (Addendum)
CM following pt.  CM provided [facility] application on pts chart and informed attending that application needs to be completed.  CM also supplied CAPs information on the chart - family/caregiver will need to contact agency and apply.  [facility] referral can be provided if ordered.  CM explained to both attending and Palliative that neither of these services will begin instantaneous as all require due process.  Both modalities informed of forms placed on shadow chart. Palliative to meet with care giver and follow up with CM regarding discharge planning.

## 2017-07-05 NOTE — Care Management Important Message (Signed)
Important Message  Patient Details  Name: David Lang MRN: [id number] Date of Birth: 04/09/1950   Medicare Important Message Given:  No    Eunique Balik 07/05/2017, 3:13 PM

## 2017-07-06 LAB — BASIC METABOLIC PANEL
Anion gap: 17 — ABNORMAL HIGH (ref 5–15)
BUN: 110 mg/dL — AB (ref 6–20)
CHLORIDE: 99 mmol/L — AB (ref 101–111)
CO2: 25 mmol/L (ref 22–32)
CREATININE: 12.14 mg/dL — AB (ref 0.61–1.24)
Calcium: 6.4 mg/dL — CL (ref 8.9–10.3)
GFR calc Af Amer: 4 mL/min — ABNORMAL LOW (ref >60)
GFR calc non Af Amer: 4 mL/min — ABNORMAL LOW (ref >60)
Glucose, Bld: 78 mg/dL (ref 65–99)
Potassium: 3.9 mmol/L (ref 3.5–5.1)
SODIUM: 141 mmol/L (ref 135–145)

## 2017-07-06 LAB — GLUCOSE, CAPILLARY
GLUCOSE-CAPILLARY: 65 mg/dL (ref 65–99)
GLUCOSE-CAPILLARY: 98 mg/dL (ref 65–99)
Glucose-Capillary: 84 mg/dL (ref 65–99)
Glucose-Capillary: 97 mg/dL (ref 65–99)
Glucose-Capillary: 126 mg/dL — ABNORMAL HIGH (ref 65–99)

## 2017-07-06 MED ORDER — CALCIUM CARBONATE ANTACID 500 MG PO CHEW
800.0000 mg | CHEWABLE_TABLET | Freq: Every day | ORAL | Status: DC
  Administered 2017-07-06 – 2017-07-09 (×4): 800 mg via ORAL
  Filled 2017-07-06 (×4): qty 4

## 2017-07-06 NOTE — Progress Notes (Signed)
Admit: 07/01/2017 LOS: 5  32M with AKI likely hypovolemia + ACEi/MRB; SZP  Subjective: SCr and BUN both downtrending; UOP > 1L  Seen by palliative care   06/10 0701 - 06/11 0700 In: 2242 [P.O.:917; I.V.:1325] Out: 1340 [Urine:1340]  Filed Weights   07/03/17 1217 07/05/17 2140  Weight: 81 kg (178 lb 9.2 oz) 80.9 kg (178 lb 5.6 oz)    Scheduled Meds: . atenolol  50 mg Oral Daily  . calcium acetate  1,334 mg Oral TID WC  . clonazePAM  0.25 mg Oral QPM  . famotidine  20 mg Oral QHS  . heparin  5,000 Units Subcutaneous Q8H  . hydrALAZINE  50 mg Oral BID  . insulin aspart  0-9 Units Subcutaneous TID WC  . OLANZapine  5 mg Oral BID  . sodium citrate-citric acid  30 mL Oral TID PC & HS  . cyanocobalamin  1,000 mcg Oral Daily   Continuous Infusions: . sodium chloride 50 mL/hr at 07/06/17 0602   PRN Meds:.acetaminophen **OR** acetaminophen, bisacodyl, ondansetron, polyethylene glycol  Current Labs: reviewed    Physical Exam:  Blood pressure (!) 157/78, pulse 62, temperature 98 F (36.7 C), temperature source Oral, resp. rate 20, height 6' (1.829 m), weight 80.9 kg (178 lb 5.6 oz), SpO2 95 %. NAD Lying in bed RRR Nl s1s2 No sig LEE NCAT EOMI  A 1. AKI, increasing UOP, stable to slightly improved GFR; no indicato nfor HD and not a chronic HD candidate 2. Chronic SZP, seen by psychiatry 3. HTN 4. DM2 5. hypocalcemia  P 1. Appears to be recovering renal function 2. Add Tums qhs for low Ca 3. No further suggestions, will cont to monitor 4. Daily weights, Daily Renal Panel, Strict I/Os, Avoid nephrotoxins (NSAIDs, judicious IV Contrast)    Sabra Heckyan Andreanna Mikolajczak MD 07/06/2017, 11:51 AM  Recent Labs  Lab 07/03/17 1022 07/04/17 0801 07/05/17 0416 07/06/17 0652  NA 142 144 141 141  K 4.0 4.4 4.2 3.9  CL 102 99* 97* 99*  CO2 23 26 21* 25  GLUCOSE 112* 74 60* 78  BUN 132* 125* 120* 110*  CREATININE 13.62* 13.20* 13.03* 12.14*  CALCIUM 6.9* 6.7* 6.5* 6.4*  PHOS 6.2*  --    --   --    Recent Labs  Lab 07/01/17 1334 07/01/17 1948 07/02/17 0627  WBC 8.8 8.4 7.9  NEUTROABS 5.7  --   --   HGB 11.0* 10.1* 9.4*  HCT 33.3* 31.3* 28.9*  MCV 87.4 87.2 87.0  PLT 536* 514* 492*

## 2017-07-06 NOTE — Progress Notes (Signed)
PROGRESS NOTE    David CarmineFuad Minasyan  ZOX:[id number] DOB: 09/29/1950 DOA: 07/01/2017 PCP: Florentina Jennyripp, [place], MD    Brief Narrative:  67 year old male who presented to the hospital due to poor oral intake. He does have significant past medical history of paranoid schizophrenia, chronic kidney disease stage III, anemia chronic kidney disease, diabetes mellitus type 2, hypertension, andhistory of ulcerative esophagitis.Patient was noted to have very poor oral intake for the last 3 days but hospitalization, associated with frequent falls.By the time of admission he was not able to give any history, all information obtained from his family members at bedside. On the initial physical examination blood pressure 116/66, heart rate 98, respiratory 17, temperature 98.0, oxygen saturation 98%. Dry mucous membranes, lungs with diminished breath sounds bilaterally, heart S1-S2 present rhythmic, abdomen soft nontender, no lower extremity edema. Patient was disoriented and confused. Sodium 141, potassium 5.9, chloride 112, BUN 9, glucose 147, BUN 141, creatinine 15.2, white count 8.8, hemoglobin 11.0, hematocrit 33.3, platelets 536,serum osmolality 340,with osmolar gap1.White cell count 8.4, hemoglobin 10.1, hematocrit 31.3, platelets 514.Urinalysis, specific gravity 1.005,urine sodium 64.EKG normal sinus rhythm, normal axis, normal intervals. Poor R wave progression.  Patient was admitted to the hospital with a working diagnosis of acute kidney injury chronic kidney disease, complicated by hyperkalemia,anion gap metabolic acidosis and uremia   Assessment & Plan:   Principal Problem:   Schizophrenia, paranoid type (HCC) Active Problems:   Schizophrenia (HCC)   Hypertension   DM type 2 causing CKD stage 3 (HCC)   Chronic anemia   Acute renal failure superimposed on stage 3 chronic kidney disease (HCC)   Acute encephalopathy   Falls frequently   Ulcerative esophagitis   Acute kidney injury  superimposed on chronic kidney disease (HCC)   Hyperkalemia, diminished renal excretion   Metabolic acidosis   Palliative care encounter  1. AKI on CKDstage 3with aniongap metabolic acidosis and hyperkalemia.Urine output has stable at 1,340 cc over last 24 hours,  continue slow rate of hypotonic saline with half normal saline at 50 ml per hour. Continue bicitra and phsolo, added calcium po. Serum cr at 12,1 with K at 3,9 and serum bicarbonate at 25, improved anion gap to 17. Will follow on renal panel in am, plan to discharge when cr less than 10. Patient not a good candidate for long term hemodialysis.   2. Paranoid Schizophrenia.Continue withclonazepamandolanzapine.No agitation, continue mittens to protect IV site.   3. HTN.Continue blood pressure control withatenolol and hydralazine, systolic blood pressure 150 mmHg.     4. T2DM.Capillary glucose has been stable at 152, 92, 65, 84, 97. Poor oral intake, contiune glucose cover and monitoring with insulin sliding scale.   DVT prophylaxis:heparin Code Status:full Family Communication:no family at the bedside.  Disposition Plan:pending renal function   Consultants:  Nephrology  Procedures:    Antimicrobials    Subjective: Patient is feeling well this am, no nausea or vomiting, no chest pain or dyspnea, no agitation.   Objective: Vitals:   07/05/17 1538 07/05/17 2140 07/06/17 0515 07/06/17 0909  BP: 134/83 (!) 154/76 (!) 152/73 (!) 157/78  Pulse: 63 (!) 55 (!) 57 62  Resp: 18 14 16 20   Temp: 98 F (36.7 C) 98.6 F (37 C) 97.7 F (36.5 C) 98 F (36.7 C)  TempSrc: Oral Oral Oral Oral  SpO2: 99% 97% 95%   Weight:  80.9 kg (178 lb 5.6 oz)    Height:        Intake/Output Summary (Last 24 hours) at 07/06/2017 1406  Last data filed at 07/06/2017 1100 Gross per 24 hour  Intake 1165 ml  Output 1640 ml  Net -475 ml   Filed Weights   07/03/17 1217 07/05/17 2140  Weight: 81 kg (178 lb 9.2  oz) 80.9 kg (178 lb 5.6 oz)    Examination:   General: Not in pain or dyspnea, deconditioned.  Neurology: Awake and alert, non focal  E ENT: no pallor, no icterus, oral mucosa moist Cardiovascular: No JVD. S1-S2 present, rhythmic, no gallops, rubs, or murmurs. Trace lower extremity edema. Pulmonary: decreased breath sounds bilaterally at bases, adequate air movement, no wheezing, rhonchi or rales. Gastrointestinal. Abdomen with no organomegaly, non tender, no rebound or guarding Skin. No rashes Musculoskeletal: no joint deformities     Data Reviewed: I have personally reviewed following labs and imaging studies  CBC: Recent Labs  Lab 07/01/17 1334 07/01/17 1948 07/02/17 0627  WBC 8.8 8.4 7.9  NEUTROABS 5.7  --   --   HGB 11.0* 10.1* 9.4*  HCT 33.3* 31.3* 28.9*  MCV 87.4 87.2 87.0  PLT 536* 514* 492*   Basic Metabolic Panel: Recent Labs  Lab 07/02/17 0627 07/03/17 1022 07/04/17 0801 07/05/17 0416 07/06/17 0652  NA 141 142 144 141 141  K 4.9 4.0 4.4 4.2 3.9  CL 114* 102 99* 97* 99*  CO2 12* 23 26 21* 25  GLUCOSE 117* 112* 74 60* 78  BUN 140* 132* 125* 120* 110*  CREATININE 15.19* 13.62* 13.20* 13.03* 12.14*  CALCIUM 7.7* 6.9* 6.7* 6.5* 6.4*  PHOS  --  6.2*  --   --   --    GFR: Estimated Creatinine Clearance: 6.5 mL/min (A) (by C-G formula based on SCr of 12.14 mg/dL (H)). Liver Function Tests: Recent Labs  Lab 07/01/17 1334 07/01/17 1948 07/02/17 0627 07/03/17 1022  AST 6* 6* 6*  --   ALT <5* 6* 6*  --   ALKPHOS 37* 35* 32*  --   BILITOT 0.7 0.6 0.6  --   PROT 6.7 6.2* 5.8*  --   ALBUMIN 3.4* 3.1* 2.8* 2.6*   No results for input(s): LIPASE, AMYLASE in the last 168 hours. No results for input(s): AMMONIA in the last 168 hours. Coagulation Profile: No results for input(s): INR, PROTIME in the last 168 hours. Cardiac Enzymes: No results for input(s): CKTOTAL, CKMB, CKMBINDEX, TROPONINI in the last 168 hours. BNP (last 3 results) No results for  input(s): PROBNP in the last 8760 hours. HbA1C: No results for input(s): HGBA1C in the last 72 hours. CBG: Recent Labs  Lab 07/05/17 1648 07/05/17 2143 07/06/17 0754 07/06/17 0908 07/06/17 1124  GLUCAP 152* 92 65 84 97   Lipid Profile: No results for input(s): CHOL, HDL, LDLCALC, TRIG, CHOLHDL, LDLDIRECT in the last 72 hours. Thyroid Function Tests: No results for input(s): TSH, T4TOTAL, FREET4, T3FREE, THYROIDAB in the last 72 hours. Anemia Panel: No results for input(s): VITAMINB12, FOLATE, FERRITIN, TIBC, IRON, RETICCTPCT in the last 72 hours.    Radiology Studies: I have reviewed all of the imaging during this hospital visit personally     Scheduled Meds: . atenolol  50 mg Oral Daily  . calcium acetate  1,334 mg Oral TID WC  . calcium carbonate  800 mg of elemental calcium Oral QHS  . clonazePAM  0.25 mg Oral QPM  . famotidine  20 mg Oral QHS  . heparin  5,000 Units Subcutaneous Q8H  . hydrALAZINE  50 mg Oral BID  . insulin aspart  0-9 Units Subcutaneous  TID WC  . OLANZapine  5 mg Oral BID  . sodium citrate-citric acid  30 mL Oral TID PC & HS  . cyanocobalamin  1,000 mcg Oral Daily   Continuous Infusions: . sodium chloride 50 mL/hr at 07/06/17 0602     LOS: 5 days        Jakub Debold Annett Gula, MD [facility] Pager [phone]

## 2017-07-07 LAB — BASIC METABOLIC PANEL
ANION GAP: 17 — AB (ref 5–15)
BUN: 104 mg/dL — AB (ref 6–20)
CHLORIDE: 99 mmol/L — AB (ref 101–111)
CO2: 26 mmol/L (ref 22–32)
Calcium: 6.7 mg/dL — ABNORMAL LOW (ref 8.9–10.3)
Creatinine, Ser: 11.12 mg/dL — ABNORMAL HIGH (ref 0.61–1.24)
GFR calc non Af Amer: 4 mL/min — ABNORMAL LOW (ref >60)
GFR, EST AFRICAN AMERICAN: 5 mL/min — AB (ref >60)
Glucose, Bld: 78 mg/dL (ref 65–99)
POTASSIUM: 4 mmol/L (ref 3.5–5.1)
SODIUM: 142 mmol/L (ref 135–145)

## 2017-07-07 LAB — GLUCOSE, CAPILLARY
GLUCOSE-CAPILLARY: 59 mg/dL — AB (ref 65–99)
GLUCOSE-CAPILLARY: 140 mg/dL — AB (ref 65–99)
GLUCOSE-CAPILLARY: 174 mg/dL — AB (ref 65–99)
Glucose-Capillary: 82 mg/dL (ref 65–99)
Glucose-Capillary: 83 mg/dL (ref 65–99)

## 2017-07-07 NOTE — Progress Notes (Addendum)
PROGRESS NOTE    David Lang  ZOX:[id number] DOB: [birth year] DOA: 07/01/2017 PCP: Florentina Jenny, MD    Brief Narrative:  67 year old male who presented to the hospital due to poor oral intake. He does have significant past medical history of paranoid schizophrenia, chronic kidney disease stage III, anemia chronic kidney disease, diabetes mellitus type 2, hypertension, andhistory of ulcerative esophagitis.Patient was noted to have very poor oral intake for the last 3 days but hospitalization, associated with frequent falls.By the time of admission he was not able to give any history, all information obtained from his family members at bedside. On the initial physical examination blood pressure 116/66, heart rate 98, respiratory 17, temperature 98.0, oxygen saturation 98%. Dry mucous membranes, lungs with diminished breath sounds bilaterally, heart S1-S2 present rhythmic, abdomen soft nontender, no lower extremity edema. Patient was disoriented and confused. Sodium 141, potassium 5.9, chloride 112, BUN 9, glucose 147, BUN 141, creatinine 15.2, white count 8.8, hemoglobin 11.0, hematocrit 33.3, platelets 536,serum osmolality 340,with osmolar gap1.White cell count 8.4, hemoglobin 10.1, hematocrit 31.3, platelets 514.Urinalysis, specific gravity 1.005,urine sodium 64.EKG normal sinus rhythm, normal axis, normal intervals. Poor R wave progression.  Patient was admitted to the hospital with a working diagnosis of acute kidney injury chronic kidney disease, complicated by hyperkalemia,anion gap metabolic acidosis and uremia   Assessment & Plan:   Principal Problem:   Schizophrenia, paranoid type (HCC) Active Problems:   Schizophrenia (HCC)   Hypertension   DM type 2 causing CKD stage 3 (HCC)   Chronic anemia   Acute renal failure superimposed on stage 3 chronic kidney disease (HCC)   Acute encephalopathy   Falls frequently   Ulcerative esophagitis   Acute kidney injury  superimposed on chronic kidney disease (HCC)   Hyperkalemia, diminished renal excretion   Metabolic acidosis   Palliative care encounter   1. AKI on CKDstage 3with aniongap metabolic acidosis and hyperkalemia.Over last 24 hours urine output up to 1,915 cc, Clnically euvolemic, will continue to follow up renal panel in am, patient is not a good candidate for renal replacement therapy due to psychiatric condition. Today K at 4,0 and serum bicarbonate at 26, will hold on further IV fluids. Continue phoslo and calcium carbonate.   2. Paranoid Schizophrenia.Has remained stable with  Clonazepamandolanzapine.Not very interactive, appears to be confused but not agitated. He does have an care giver at home, but his family is from other state.   3. HTN.Blood pressure control withatenolol and hydralazine.   4. T2DM.Continue glucose cover and monitoring with insulin sliding scale, Capillary glucose has been stable at 126, 98, 59, 82, 140.   DVT prophylaxis:heparin Code Status:full Family Communication:no family at the bedside.  Disposition Plan:pending renal function   Consultants:  Nephrology  Procedures:    Antimicrobials   Subjective: Patient is feeling well, no nausea or vomiting, no chest pain or dyspnea.   Objective: Vitals:   07/06/17 1720 07/06/17 2120 07/07/17 0515 07/07/17 0908  BP: (!) 147/75 (!) 170/79 (!) 168/90 (!) 158/82  Pulse: 66 (!) 57 69 63  Resp: 18 16 18 18   Temp: 97.8 F (36.6 C) 98.7 F (37.1 C) 97.6 F (36.4 C) 97.8 F (36.6 C)  TempSrc: Oral Oral Oral Oral  SpO2: 98% 95% 97% 98%  Weight:  80.9 kg (178 lb 6.4 oz)    Height:        Intake/Output Summary (Last 24 hours) at 07/07/2017 1333 Last data filed at 07/07/2017 0900 Gross per 24 hour  Intake 2024.17 ml  Output 1815 ml  Net 209.17 ml   Filed Weights   07/03/17 1217 07/05/17 2140 07/06/17 2120  Weight: 81 kg (178 lb 9.2 oz) 80.9 kg (178 lb 5.6 oz) 80.9 kg  (178 lb 6.4 oz)    Examination:   General: Not in pain or dyspnea, deconditioned  Neurology: Awake and alert, non focal  E ENT: mild pallor, no icterus, oral mucosa moist Cardiovascular: No JVD. S1-S2 present, rhythmic, no gallops, rubs, or murmurs. No lower extremity edema. Pulmonary: decreased breath sounds bilaterally, poor air movement, no wheezing, rhonchi or rales. Gastrointestinal. Abdomen with no organomegaly, non tender, no rebound or guarding Skin. No rashes Musculoskeletal: no joint deformities     Data Reviewed: I have personally reviewed following labs and imaging studies  CBC: Recent Labs  Lab 07/01/17 1334 07/01/17 1948 07/02/17 0627  WBC 8.8 8.4 7.9  NEUTROABS 5.7  --   --   HGB 11.0* 10.1* 9.4*  HCT 33.3* 31.3* 28.9*  MCV 87.4 87.2 87.0  PLT 536* 514* 492*   Basic Metabolic Panel: Recent Labs  Lab 07/03/17 1022 07/04/17 0801 07/05/17 0416 07/06/17 0652 07/07/17 0501  NA 142 144 141 141 142  K 4.0 4.4 4.2 3.9 4.0  CL 102 99* 97* 99* 99*  CO2 23 26 21* 25 26  GLUCOSE 112* 74 60* 78 78  BUN 132* 125* 120* 110* 104*  CREATININE 13.62* 13.20* 13.03* 12.14* 11.12*  CALCIUM 6.9* 6.7* 6.5* 6.4* 6.7*  PHOS 6.2*  --   --   --   --    GFR: Estimated Creatinine Clearance: 7.1 mL/min (A) (by C-G formula based on SCr of 11.12 mg/dL (H)). Liver Function Tests: Recent Labs  Lab 07/01/17 1334 07/01/17 1948 07/02/17 0627 07/03/17 1022  AST 6* 6* 6*  --   ALT <5* 6* 6*  --   ALKPHOS 37* 35* 32*  --   BILITOT 0.7 0.6 0.6  --   PROT 6.7 6.2* 5.8*  --   ALBUMIN 3.4* 3.1* 2.8* 2.6*   No results for input(s): LIPASE, AMYLASE in the last 168 hours. No results for input(s): AMMONIA in the last 168 hours. Coagulation Profile: No results for input(s): INR, PROTIME in the last 168 hours. Cardiac Enzymes: No results for input(s): CKTOTAL, CKMB, CKMBINDEX, TROPONINI in the last 168 hours. BNP (last 3 results) No results for input(s): PROBNP in the last 8760  hours. HbA1C: No results for input(s): HGBA1C in the last 72 hours. CBG: Recent Labs  Lab 07/06/17 1627 07/06/17 2123 07/07/17 0731 07/07/17 0804 07/07/17 1118  GLUCAP 126* 98 59* 82 140*   Lipid Profile: No results for input(s): CHOL, HDL, LDLCALC, TRIG, CHOLHDL, LDLDIRECT in the last 72 hours. Thyroid Function Tests: No results for input(s): TSH, T4TOTAL, FREET4, T3FREE, THYROIDAB in the last 72 hours. Anemia Panel: No results for input(s): VITAMINB12, FOLATE, FERRITIN, TIBC, IRON, RETICCTPCT in the last 72 hours.    Radiology Studies: I have reviewed all of the imaging during this hospital visit personally     Scheduled Meds: . atenolol  50 mg Oral Daily  . calcium acetate  1,334 mg Oral TID WC  . calcium carbonate  800 mg of elemental calcium Oral QHS  . clonazePAM  0.25 mg Oral QPM  . famotidine  20 mg Oral QHS  . heparin  5,000 Units Subcutaneous Q8H  . hydrALAZINE  50 mg Oral BID  . insulin aspart  0-9 Units Subcutaneous TID WC  . OLANZapine  5 mg Oral  BID  . sodium citrate-citric acid  30 mL Oral TID PC & HS  . cyanocobalamin  1,000 mcg Oral Daily   Continuous Infusions: . sodium chloride 50 mL/hr at 07/07/17 0239     LOS: 6 days        Mauricio Annett Gulaaniel Arrien, MD [facility] Pager [phone]

## 2017-07-07 NOTE — Care Management Note (Signed)
Case Management Note  Patient Details  Name: David Lang MRN: [id number] Date of Birth: 11/05/1950  Subjective/Objective:                Patient admitted with poor oral intake, weakness and falls at home.  He does have significant past medical history of paranoid schizophrenia, chronic kidney disease stage III, anemia chronic kidney disease, diabetes mellitus type 2, hypertension, andhistory of ulcerative esophagitis.Plan for DC when SCr <10. Patient is not a good candidate for long term HD. Patient from home, family helps to care for him, but he has not been taking his medications per notes. CM following     Action/PlanFerne Reus:  Shammas,[name] Sister [phone]  [phone]   Called numbers above, returning call to patient's sister who was here earlier in the day. First number went to VM and had male voice, did not leave message. Second number could not leave VM.   Expected Discharge Date:  (unknown)               Expected Discharge Plan:  Home w Home Health Services  In-House Referral:     Discharge planning Services  CM Consult  Post Acute Care Choice:    Choice offered to:     DME Arranged:    DME Agency:     HH Arranged:    HH Agency:     Status of Service:  In process, will continue to follow  If discussed at Long Length of Stay Meetings, dates discussed:    Additional Comments:  Lawerance SabalDebbie Abbegale Stehle, RN 07/07/2017, 3:11 PM

## 2017-07-07 NOTE — Progress Notes (Signed)
Admit: 07/01/2017 LOS: 6  23M with AKI likely hypovolemia + ACEi/MRB; SZP  Subjective: SCr and BUN cont to improve slowly.  Good UOP No other issues  06/11 0701 - 06/12 0700 In: 2024.2 [P.O.:580; I.V.:1444.2] Out: 1915 [Urine:1915]  Filed Weights   07/03/17 1217 07/05/17 2140 07/06/17 2120  Weight: 81 kg (178 lb 9.2 oz) 80.9 kg (178 lb 5.6 oz) 80.9 kg (178 lb 6.4 oz)    Scheduled Meds: . atenolol  50 mg Oral Daily  . calcium acetate  1,334 mg Oral TID WC  . calcium carbonate  800 mg of elemental calcium Oral QHS  . clonazePAM  0.25 mg Oral QPM  . famotidine  20 mg Oral QHS  . heparin  5,000 Units Subcutaneous Q8H  . hydrALAZINE  50 mg Oral BID  . insulin aspart  0-9 Units Subcutaneous TID WC  . OLANZapine  5 mg Oral BID  . sodium citrate-citric acid  30 mL Oral TID PC & HS  . cyanocobalamin  1,000 mcg Oral Daily   Continuous Infusions: . sodium chloride 50 mL/hr at 07/07/17 0239   PRN Meds:.acetaminophen **OR** acetaminophen, bisacodyl, ondansetron, polyethylene glycol  Current Labs: reviewed    Physical Exam:  Blood pressure (!) 158/82, pulse 63, temperature 97.8 F (36.6 C), temperature source Oral, resp. rate 18, height 6' (1.829 m), weight 80.9 kg (178 lb 6.4 oz), SpO2 98 %. NAD Lying in bed RRR Nl s1s2 No sig LEE NCAT EOMI  A 1. AKI, increasing UOP, stable to slightly improved GFR; no indicaton for HD and not a chronic HD candidate 2. Chronic SZP, seen by psychiatry 3. HTN 4. DM2 5. hypocalcemia  P 1. Appears to be recovering renal function 2. Cont Tums qhs for low Ca 3. No further suggestions, will sign off for now 4. Doesn't necessarily need renal f/u at this time; would see where renal function settles and he cna be referred as needed 5. Daily weights, Daily Renal Panel, Strict I/Os, Avoid nephrotoxins (NSAIDs, judicious IV Contrast)    Sabra Heckyan Sanford MD 07/07/2017, 12:28 PM  Recent Labs  Lab 07/03/17 1022  07/05/17 0416 07/06/17 0652  07/07/17 0501  NA 142   < > 141 141 142  K 4.0   < > 4.2 3.9 4.0  CL 102   < > 97* 99* 99*  CO2 23   < > 21* 25 26  GLUCOSE 112*   < > 60* 78 78  BUN 132*   < > 120* 110* 104*  CREATININE 13.62*   < > 13.03* 12.14* 11.12*  CALCIUM 6.9*   < > 6.5* 6.4* 6.7*  PHOS 6.2*  --   --   --   --    < > = values in this interval not displayed.   Recent Labs  Lab 07/01/17 1334 07/01/17 1948 07/02/17 0627  WBC 8.8 8.4 7.9  NEUTROABS 5.7  --   --   HGB 11.0* 10.1* 9.4*  HCT 33.3* 31.3* 28.9*  MCV 87.4 87.2 87.0  PLT 536* 514* 492*

## 2017-07-08 DIAGNOSIS — N189 Chronic kidney disease, unspecified: Secondary | ICD-10-CM

## 2017-07-08 DIAGNOSIS — G9340 Encephalopathy, unspecified: Secondary | ICD-10-CM

## 2017-07-08 DIAGNOSIS — N179 Acute kidney failure, unspecified: Principal | ICD-10-CM

## 2017-07-08 DIAGNOSIS — I10 Essential (primary) hypertension: Secondary | ICD-10-CM

## 2017-07-08 DIAGNOSIS — E875 Hyperkalemia: Secondary | ICD-10-CM

## 2017-07-08 DIAGNOSIS — E872 Acidosis: Secondary | ICD-10-CM

## 2017-07-08 DIAGNOSIS — N183 Chronic kidney disease, stage 3 (moderate): Secondary | ICD-10-CM

## 2017-07-08 DIAGNOSIS — F200 Paranoid schizophrenia: Secondary | ICD-10-CM

## 2017-07-08 LAB — GLUCOSE, CAPILLARY
GLUCOSE-CAPILLARY: 62 mg/dL — AB (ref 65–99)
GLUCOSE-CAPILLARY: 67 mg/dL (ref 65–99)
GLUCOSE-CAPILLARY: 124 mg/dL — AB (ref 65–99)
GLUCOSE-CAPILLARY: 98 mg/dL (ref 65–99)
Glucose-Capillary: 144 mg/dL — ABNORMAL HIGH (ref 65–99)

## 2017-07-08 MED ORDER — DEXTROSE 50 % IV SOLN
INTRAVENOUS | Status: AC
  Administered 2017-07-08: 50 mL
  Filled 2017-07-08: qty 50

## 2017-07-08 NOTE — Evaluation (Signed)
Physical Therapy Evaluation Patient Details Name: David CarmineFuad Lang MRN: [id number] DOB: 05/13/1950 Today's Date: 07/08/2017   History of Present Illness  Pt. is a 67 y.o. M with significant PMH of paranoid schizophrenia, chronic kidney disease stage III, anemia of chronic disease, diabetes mellitus type 2, hypertension, hepatic steatosis, history of ulcerative esophagitis, history of recurrent falls, history of urinary tract infections who presents with loss of appetite, weakness, and increased falls.   Clinical Impression  Pt admitted with above diagnosis. Pt currently with functional limitations due to the deficits listed below (see PT Problem List). No family/caregiver present to determine previous level of function or home living situation. Patient is not oriented but follows most simple commands. Patient presenting with decreased functional mobility secondary to cognitive deficits, decreased safety awareness, generalized weakness, and balance impairments. Presents as a very high fall risk based on deficits listed above and history of falling. Pt will benefit from skilled PT to increase their independence and safety with mobility to allow discharge to the venue listed below.       Follow Up Recommendations SNF;Supervision/Assistance - 24 hour    Equipment Recommendations  None recommended by PT    Recommendations for Other Services       Precautions / Restrictions Precautions Precautions: Fall Restrictions Weight Bearing Restrictions: No      Mobility  Bed Mobility Overal bed mobility: Needs Assistance Bed Mobility: Supine to Sit     Supine to sit: Mod assist     General bed mobility comments: mod assist to elevate trunk and bring hips to edge of bed using bed pad  Transfers Overall transfer level: Needs assistance Equipment used: 2 person hand held assist Transfers: Sit to/from Stand;Stand Pivot Transfers Sit to Stand: Mod assist;+2 physical assistance Stand pivot  transfers: Max assist;+2 physical assistance       General transfer comment: Patietn requiring mod assist + 2 to boost up from standing from EOB and BSC. Max assist + 2 for stand pivot transfers with patient demonstrating increased unsteadiness and knee flexion as well as trunk flexion  Ambulation/Gait             General Gait Details: not safe at this time  Stairs            Wheelchair Mobility    Modified Rankin (Stroke Patients Only)       Balance Overall balance assessment: Needs assistance Sitting-balance support: No upper extremity supported;Feet supported Sitting balance-Leahy Scale: Fair     Standing balance support: Bilateral upper extremity supported Standing balance-Leahy Scale: Poor Standing balance comment: reliant on external support                             Pertinent Vitals/Pain Pain Assessment: Faces Faces Pain Scale: No hurt    Home Living Family/patient expects to be discharged to:: Unsure Living Arrangements: Other relatives               Additional Comments: no family/caregiver present for home living situation    Prior Function           Comments: No family/caregiver present to determine PLOF. Per chart review, patient using a walker in 2016     Hand Dominance        Extremity/Trunk Assessment   Upper Extremity Assessment Upper Extremity Assessment: Defer to OT evaluation    Lower Extremity Assessment Lower Extremity Assessment: Generalized weakness    Cervical / Trunk Assessment Cervical / Trunk  Assessment: Kyphotic  Communication   Communication: Other (comment)(Patient can speak English, but switches to other languages)  Cognition Arousal/Alertness: Awake/alert Behavior During Therapy: Flat affect Overall Cognitive Status: No family/caregiver present to determine baseline cognitive functioning                                 General Comments: A&Ox1. Patient able to follow  simple commands with increased time. Impaired judgment and decreased safety awareness, as patient tried to stand from [facility] without assistance.       General Comments      Exercises     Assessment/Plan    PT Assessment Patient needs continued PT services  PT Problem List Decreased strength;Decreased range of motion;Decreased activity tolerance;Decreased balance;Decreased mobility;Decreased cognition;Decreased safety awareness       PT Treatment Interventions DME instruction;Gait training;Functional mobility training;Therapeutic activities;Therapeutic exercise;Balance training;Patient/family education    PT Goals (Current goals can be found in the Care Plan section)  Acute Rehab PT Goals Patient Stated Goal: get out of bed PT Goal Formulation: With patient Time For Goal Achievement: 07/22/17 Potential to Achieve Goals: Poor    Frequency Min 2X/week   Barriers to discharge        Co-evaluation PT/OT/SLP Co-Evaluation/Treatment: Yes Reason for Co-Treatment: Complexity of the patient's impairments (multi-system involvement);Necessary to address cognition/behavior during functional activity;For patient/therapist safety;To address functional/ADL transfers PT goals addressed during session: Mobility/safety with mobility         AM-PAC PT "6 Clicks" Daily Activity  Outcome Measure Difficulty turning over in bed (including adjusting bedclothes, sheets and blankets)?: Unable Difficulty moving from lying on back to sitting on the side of the bed? : Unable Difficulty sitting down on and standing up from a chair with arms (e.g., wheelchair, bedside commode, etc,.)?: Unable Help needed moving to and from a bed to chair (including a wheelchair)?: A Lot Help needed walking in hospital room?: A Lot Help needed climbing 3-5 steps with a railing? : Total 6 Click Score: 8    End of Session Equipment Utilized During Treatment: Gait belt Activity Tolerance: Patient tolerated treatment  well Patient left: in chair;with call bell/phone within reach;with chair alarm set Nurse Communication: Mobility status PT Visit Diagnosis: Unsteadiness on feet (R26.81);Muscle weakness (generalized) (M62.81);Difficulty in walking, not elsewhere classified (R26.2)    Time: 1191-4782 PT Time Calculation (min) (ACUTE ONLY): 23 min   Charges:   PT Evaluation $PT Eval Moderate Complexity: 1 Mod     PT G Codes:       Laurina Bustle, PT, DPT Acute Rehabilitation Services  Pager: [phone]   Vanetta Mulders 07/08/2017, 9:36 AM

## 2017-07-08 NOTE — Progress Notes (Signed)
Patient ID: David CarmineFuad Lang, male   DOB: 11/06/1950, 67 y.o.   MRN: [id number]  This NP has made multiple attempts to contact family hoping to clarify GOC and treatment plan without success.    Patient is likely  eligible for home hospice benefit if family is interested and focus of care is comfort.  Discussed with CMRN and attending; will sign off at his time.  No charge  Lorinda CreedMary Larach NP  Palliative Medicine Team Team Phone # [phone] Pager [phone]

## 2017-07-08 NOTE — Progress Notes (Signed)
[facility].  Notified by Lawerance Sabalebbie Swist,  CMRN of [facility]family [facility] after discharge. [facility] and [facility]. Hospice eligibility pending at this time.   Writer spoke with sister Dorna MaiLeila and caregiver Everardo PacificYusra by telephone to initiate education related to hospice philosophy, services and team approach to care. verbalized understanding of information given. Discharge date is not yet determined.  Everardo PacificYusra will meet with Hospital Liaison at 8:30 07/09/2017 in the patients room to discuss DME needs and further explain services.  [facility] aware of the above. Please notify [facility] when patient is ready to leave the unit at discharge. (Call [phone] or [phone] after 5pm.)  Above information shared with CMRN.   Please call with any hospice related questions.   Thank you for this referral.   Elsie SaasMary Anne Robertson, RN, [facility]  [facility] Hospital Liaison  [phone]  ?  Hospital liaisons are now on AMION.

## 2017-07-08 NOTE — Progress Notes (Addendum)
PROGRESS NOTE    David Lang  JXB:[id number] DOB: Nov 12, 1950 DOA: 07/01/2017 PCP: Florentina Jenny, MD   Brief Narrative:  HPI on 07/01/2017 by Dr. Marguerita Merles David Lang is a 67 y.o. male with medical history significant of paranoid schizophrenia, chronic kidney disease stage III, anemia of chronic disease, diabetes mellitus type 2, hypertension, hepatic steatosis, history of ulcerative esophagitis, history of recurrent falls, history of urinary tract infections and other comorbidities who presented to [facility] emergency room brought in by his caregiver for a loss of appetite.  Caregivers noticed that patient has not eaten in 3 days of last week and has been falling frequently.  Family present at bedside states the patient never this confused and knows 56 languages and is able to converse in [place].  A subjective history is not able to be obtained from the patient due to his confusion and altered mental status so it was obtained from the chart review, as well as family and the EDP.    Per report the caregiver brought the patient into the emergency room because of a loss of appetite, and nephew says that he checks on him intermittently and the patient has been falling more.  Per ED report patient has been losing more weight in the last year following and becoming confused easily with speech disturbances.  Further evaluation work-up revealed that patient had a creatinine of 15 and concern for uremic symptoms so [facility] was called to admit the patient to [facility] and have nephrology consult over there for possible need of dialysis.  Interim history Admitted for acute on chronic kidney disease with hyperkalemia. Assessment & Plan   Acute kidney injury on chronic kidney disease, stage III with anion gap metabolic acidosis and hyperkalemia -Creatinine on admission 15.28 currently down to 11.12 -Possibly secondary to hypovolemia and medications -Nephrology consulted and appreciated, no indication for  hemodialysis, patient is on a chronic hemodialysis candidate  Paranoid schizophrenia -Continue clonazepam and olanzapine  Essential hypertension -continue atenolol and hydralazine   Diabetes mellitus, type II -Continue insulin sliding scale and CBG monitoring  Goals of care -Palliative care consulted and appreciated, however has not been able to reach family -Discussed with patient's sister via phone, expressed wishes for patient to be DNR and would like for him to go home with hospice.  Hypocalcemia -Continue Tums  Deconditioning -PT and OT recommended SNF  DVT Prophylaxis  Heparin  Code Status: Full  Family Communication: None at bedside. Dicussed with sister via phone.   Disposition Plan: Admitted. Pending improvement  Consultants Nephrology Palliative care  Procedures  None  Antibiotics   Anti-infectives (From admission, onward)   None      Subjective:   [name] seen and examined today.  Patient has no complaints today.  Expresses wanting to go home.  Denies current chest pain, shortness of breath, abdominal pain, nausea or vomiting, diarrhea or constipation.  Objective:   Vitals:   07/08/17 0619 07/08/17 0845 07/08/17 1112 07/08/17 1240  BP: (!) 168/99 (!) 175/84 (!) 133/95 (!) 141/76  Pulse: 61 64  65  Resp: 20 16    Temp: 98.1 F (36.7 C) 97.9 F (36.6 C)    TempSrc: Oral Oral    SpO2:  95%  98%  Weight:      Height:        Intake/Output Summary (Last 24 hours) at 07/08/2017 1531 Last data filed at 07/08/2017 1200 Gross per 24 hour  Intake 217 ml  Output 200 ml  Net  17 ml   Filed Weights   07/03/17 1217 07/05/17 2140 07/06/17 2120  Weight: 81 kg (178 lb 9.2 oz) 80.9 kg (178 lb 5.6 oz) 80.9 kg (178 lb 6.4 oz)    Exam  General: Well developed, well nourished, NAD, appears stated age  HEENT: NCAT, mucous membranes moist.   Neck: Supple  Cardiovascular: S1 S2 auscultated, no rubs, murmurs or gallops. Regular rate and  rhythm.  Respiratory: Clear to auscultation bilaterally with equal chest rise  Abdomen: Soft, nontender, nondistended, + bowel sounds  Extremities: warm dry without cyanosis clubbing or edema  Neuro: AAOx1 (self only), nonfocal  Skin: Without rashes exudates or nodules  Psych: Normal affect and demeanor with intact judgement and insight   Data Reviewed: I have personally reviewed following labs and imaging studies  CBC: Recent Labs  Lab 07/01/17 1948 07/02/17 0627  WBC 8.4 7.9  HGB 10.1* 9.4*  HCT 31.3* 28.9*  MCV 87.2 87.0  PLT 514* 492*   Basic Metabolic Panel: Recent Labs  Lab 07/03/17 1022 07/04/17 0801 07/05/17 0416 07/06/17 0652 07/07/17 0501  NA 142 144 141 141 142  K 4.0 4.4 4.2 3.9 4.0  CL 102 99* 97* 99* 99*  CO2 23 26 21* 25 26  GLUCOSE 112* 74 60* 78 78  BUN 132* 125* 120* 110* 104*  CREATININE 13.62* 13.20* 13.03* 12.14* 11.12*  CALCIUM 6.9* 6.7* 6.5* 6.4* 6.7*  PHOS 6.2*  --   --   --   --    GFR: Estimated Creatinine Clearance: 7.1 mL/min (A) (by C-G formula based on SCr of 11.12 mg/dL (H)). Liver Function Tests: Recent Labs  Lab 07/01/17 1948 07/02/17 0627 07/03/17 1022  AST 6* 6*  --   ALT 6* 6*  --   ALKPHOS 35* 32*  --   BILITOT 0.6 0.6  --   PROT 6.2* 5.8*  --   ALBUMIN 3.1* 2.8* 2.6*   No results for input(s): LIPASE, AMYLASE in the last 168 hours. No results for input(s): AMMONIA in the last 168 hours. Coagulation Profile: No results for input(s): INR, PROTIME in the last 168 hours. Cardiac Enzymes: No results for input(s): CKTOTAL, CKMB, CKMBINDEX, TROPONINI in the last 168 hours. BNP (last 3 results) No results for input(s): PROBNP in the last 8760 hours. HbA1C: No results for input(s): HGBA1C in the last 72 hours. CBG: Recent Labs  Lab 07/07/17 2203 07/08/17 0739 07/08/17 0813 07/08/17 0847 07/08/17 1129  GLUCAP 83 67 62* 144* 124*   Lipid Profile: No results for input(s): CHOL, HDL, LDLCALC, TRIG, CHOLHDL,  LDLDIRECT in the last 72 hours. Thyroid Function Tests: No results for input(s): TSH, T4TOTAL, FREET4, T3FREE, THYROIDAB in the last 72 hours. Anemia Panel: No results for input(s): VITAMINB12, FOLATE, FERRITIN, TIBC, IRON, RETICCTPCT in the last 72 hours. Urine analysis:    Component Value Date/Time   COLORURINE YELLOW 07/02/2017 1210   APPEARANCEUR CLEAR 07/02/2017 1210   LABSPEC 1.010 07/02/2017 1210   PHURINE 5.0 07/02/2017 1210   GLUCOSEU NEGATIVE 07/02/2017 1210   HGBUR NEGATIVE 07/02/2017 1210   BILIRUBINUR NEGATIVE 07/02/2017 1210   KETONESUR NEGATIVE 07/02/2017 1210   PROTEINUR NEGATIVE 07/02/2017 1210   UROBILINOGEN 1.0 10/09/2014 2208   NITRITE NEGATIVE 07/02/2017 1210   LEUKOCYTESUR NEGATIVE 07/02/2017 1210   Sepsis Labs: @LABRCNTIP (procalcitonin:4,lacticidven:4)  ) Recent Results (from the past 240 hour(s))  Urine culture     Status: Abnormal   Collection Time: 07/02/17 12:49 PM  Result Value Ref Range Status   Specimen  Description URINE, RANDOM  Final   Special Requests NONE  Final   Culture (A)  Final    <10,000 COLONIES/mL INSIGNIFICANT GROWTH Performed at [facility], [address] [place], [place] [address]    Report Status 07/03/2017 FINAL  Final      Radiology Studies: No results found.   Scheduled Meds: . atenolol  50 mg Oral Daily  . calcium acetate  1,334 mg Oral TID WC  . calcium carbonate  800 mg of elemental calcium Oral QHS  . clonazePAM  0.25 mg Oral QPM  . famotidine  20 mg Oral QHS  . heparin  5,000 Units Subcutaneous Q8H  . hydrALAZINE  50 mg Oral BID  . insulin aspart  0-9 Units Subcutaneous TID WC  . OLANZapine  5 mg Oral BID  . sodium citrate-citric acid  30 mL Oral TID PC & HS  . cyanocobalamin  1,000 mcg Oral Daily   Continuous Infusions:   LOS: 7 days   Time Spent in minutes   45 minutes  Printice Hellmer D.O. on 07/08/2017 at 3:31 PM  Between 7am to 7pm - Pager - [phone]  After 7pm go to [email or web address]  - password [email or web address]  And look for the night coverage person covering for me after hours  [facility] Office  [phone]

## 2017-07-08 NOTE — Progress Notes (Addendum)
13:48 Spoke w David LandMary L NP palliative this morning. Patient would be eligible for home hospice, but she had difficulty as did I reaching family to discuss. I got ahold of sister David Lang on home number and discussed dc planning. She is interested in home hospice and [facility]. I [facility] page [facility] requesting her to call [name]. Will continue to follow. David Lang stated that David Lang is caregiver and we can share information with her, but decisions should be made by a sister either David Lang or David Lang.   [facility],David Lang Sister [phone]  [phone]  Barbie HaggisMelson,David Lang Sister [phone]  [phone]  Suzi RootsX, Yusra  [phone]     15:45 Spoke w Darrick MeigsNajwa again who states she tried but didn't connect w David DandyMary. She granted permission to move forward with Home Hospice. She stated her sister who lives in [place] could be resource for decisions. Spoke w Sister Dorna MaiLeila on cell number who agreed w home hospice and stated that they discussed him being changed to DNR. Told David Lang I would have Dr Catha GosselinMikhail contact her to conform that and change it. Updated Dr Catha GosselinMikhail on DNR request, and family's decision to move forward with home hospice, provided her with David Lang's cell number. Referral made to [facility], [facility] Robertson. Spoke w David Lang, she states she will be able to provide 24 hour care and was very appreciative for additional home hospice help. She will need to be the contact for home set up, first appointment and DME needs (none anticipated).  Will transport via [facility].

## 2017-07-08 NOTE — Evaluation (Signed)
Occupational Therapy Evaluation Patient Details Name: Hoover Grewe MRN: [id number] DOB: 17-Apr-1950 Today's Date: 07/08/2017    History of Present Illness Pt. is a 67 y.o. M with significant PMH of paranoid schizophrenia, chronic kidney disease stage III, anemia of chronic disease, diabetes mellitus type 2, hypertension, hepatic steatosis, history of ulcerative esophagitis, history of recurrent falls, history of urinary tract infections who presents with loss of appetite, weakness, and increased falls.    Clinical Impression   Unsure of PLOF; no family present to provide and pt is a poor historian. Currently pt requires max assist +2 for stand pivot transfers and min-max assist for ADL. Recommending SNF for follow up to maximize independence and safety with ADL and functional mobility. Pt would benefit from continued skilled OT to address established goals.    Follow Up Recommendations  SNF;Supervision/Assistance - 24 hour    Equipment Recommendations  Other (comment)(TBD at next venue)    Recommendations for Other Services       Precautions / Restrictions Precautions Precautions: Fall Restrictions Weight Bearing Restrictions: No      Mobility Bed Mobility Overal bed mobility: Needs Assistance Bed Mobility: Supine to Sit     Supine to sit: Mod assist     General bed mobility comments: mod assist to elevate trunk and bring hips to edge of bed using bed pad  Transfers Overall transfer level: Needs assistance Equipment used: 2 person hand held assist Transfers: Sit to/from Stand;Stand Pivot Transfers Sit to Stand: Mod assist;+2 physical assistance Stand pivot transfers: Max assist;+2 physical assistance       General transfer comment: Patietn requiring mod assist + 2 to boost up from standing from EOB and BSC. Max assist + 2 for stand pivot transfers with patient demonstrating increased unsteadiness and knee flexion as well as trunk flexion    Balance Overall balance  assessment: Needs assistance Sitting-balance support: No upper extremity supported;Feet supported Sitting balance-Leahy Scale: Fair     Standing balance support: Bilateral upper extremity supported Standing balance-Leahy Scale: Poor Standing balance comment: reliant on external support                           ADL either performed or assessed with clinical judgement   ADL Overall ADL's : Needs assistance/impaired Eating/Feeding: Set up;Sitting Eating/Feeding Details (indicate cue type and reason): Pt able to drink orange juice with set up Grooming: Supervision/safety;Sitting   Upper Body Bathing: Minimal assistance;Sitting   Lower Body Bathing: Maximal assistance;+2 for physical assistance;Sit to/from stand   Upper Body Dressing : Minimal assistance;Sitting   Lower Body Dressing: Maximal assistance;Sit to/from stand   Toilet Transfer: Maximal assistance;+2 for physical assistance;Stand-pivot;BSC Toilet Transfer Details (indicate cue type and reason): 2 person HHA         Functional mobility during ADLs: Maximal assistance;+2 for physical assistance(for stand pivot; 2 person HHA)       Vision         Perception     Praxis      Pertinent Vitals/Pain Pain Assessment: Faces Faces Pain Scale: No hurt     Hand Dominance     Extremity/Trunk Assessment Upper Extremity Assessment Upper Extremity Assessment: Generalized weakness   Lower Extremity Assessment Lower Extremity Assessment: Defer to PT evaluation   Cervical / Trunk Assessment Cervical / Trunk Assessment: Kyphotic   Communication Communication Communication: Other (comment)(pt can speak English but switches to other languages)   Cognition Arousal/Alertness: Awake/alert Behavior During Therapy: Impulsive;Flat affect Overall Cognitive  Status: No family/caregiver present to determine baseline cognitive functioning                                 General Comments: A&Ox1.  Patient able to follow simple commands with increased time. Impaired judgment and decreased safety awareness, as patient [facility] without assistance.    General Comments       Exercises     Shoulder Instructions      Home Living Family/patient expects to be discharged to:: Unsure Living Arrangements: Other relatives                               Additional Comments: no family/caregiver present for home living situation      Prior Functioning/Environment          Comments: No family/caregiver present to determine PLOF. Per chart review, patient using a walker in 2016        OT Problem List: Decreased strength;Decreased activity tolerance;Impaired balance (sitting and/or standing);Decreased cognition;Decreased safety awareness;Decreased knowledge of use of DME or AE      OT Treatment/Interventions: Self-care/ADL training;Therapeutic exercise;Energy conservation;DME and/or AE instruction;Therapeutic activities;Patient/family education;Balance training    OT Goals(Current goals can be found in the care plan section) Acute Rehab OT Goals Patient Stated Goal: get out of bed OT Goal Formulation: With patient Time For Goal Achievement: 07/22/17 Potential to Achieve Goals: Fair ADL Goals Pt Will Perform Upper Body Bathing: with supervision;sitting Pt Will Perform Lower Body Bathing: with min assist;sit to/from stand Pt Will Transfer to Toilet: with min assist;stand pivot transfer;bedside commode Additional ADL Goal #1: Pt will follow 2 step command in a non distracting environment 50% of the time.  OT Frequency: Min 2X/week   Barriers to D/C:            Co-evaluation PT/OT/SLP Co-Evaluation/Treatment: Yes Reason for Co-Treatment: Complexity of the patient's impairments (multi-system involvement);Necessary to address cognition/behavior during functional activity;For patient/therapist safety;To address functional/ADL transfers PT goals addressed  during session: Mobility/safety with mobility OT goals addressed during session: ADL's and self-care      AM-PAC PT "6 Clicks" Daily Activity     Outcome Measure Help from another person eating meals?: A Little Help from another person taking care of personal grooming?: A Little Help from another person toileting, which includes using toliet, bedpan, or urinal?: A Lot Help from another person bathing (including washing, rinsing, drying)?: A Lot Help from another person to put on and taking off regular upper body clothing?: A Little Help from another person to put on and taking off regular lower body clothing?: A Lot 6 Click Score: 15   End of Session Equipment Utilized During Treatment: Gait belt  Activity Tolerance: Patient tolerated treatment well Patient left: in chair;with call bell/phone within reach;with chair alarm set  OT Visit Diagnosis: Unsteadiness on feet (R26.81);Other abnormalities of gait and mobility (R26.89);Muscle weakness (generalized) (M62.81);History of falling (Z91.81)                Time: [id number] OT Time Calculation (min): 23 min Charges:  OT General Charges $OT Visit: 1 Visit OT Evaluation $OT Eval Moderate Complexity: 1 Mod G-Codes:     Joniah Bednarski A. Brett Albinooffey, M.S., OTR/L Acute Rehab Department: [phone]  Gaye AlkenBailey A Pennie Vanblarcom 07/08/2017, 10:35 AM

## 2017-07-09 DIAGNOSIS — D649 Anemia, unspecified: Secondary | ICD-10-CM

## 2017-07-09 DIAGNOSIS — E1122 Type 2 diabetes mellitus with diabetic chronic kidney disease: Secondary | ICD-10-CM

## 2017-07-09 LAB — CBC
HEMATOCRIT: 34.3 % — AB (ref 39.0–52.0)
Hemoglobin: 10.6 g/dL — ABNORMAL LOW (ref 13.0–17.0)
MCH: 28 pg (ref 26.0–34.0)
MCHC: 30.9 g/dL (ref 30.0–36.0)
MCV: 90.5 fL (ref 78.0–100.0)
Platelets: 400 10*3/uL (ref 150–400)
RBC: 3.79 MIL/uL — AB (ref 4.22–5.81)
RDW: 12.2 % (ref 11.5–15.5)
WBC: 11.7 10*3/uL — ABNORMAL HIGH (ref 4.0–10.5)

## 2017-07-09 LAB — GLUCOSE, CAPILLARY
GLUCOSE-CAPILLARY: 97 mg/dL (ref 65–99)
GLUCOSE-CAPILLARY: 149 mg/dL — AB (ref 65–99)
GLUCOSE-CAPILLARY: 148 mg/dL — AB (ref 65–99)
Glucose-Capillary: 130 mg/dL — ABNORMAL HIGH (ref 65–99)

## 2017-07-09 LAB — BASIC METABOLIC PANEL
Anion gap: 15 (ref 5–15)
BUN: 90 mg/dL — AB (ref 6–20)
CO2: 26 mmol/L (ref 22–32)
CREATININE: 10.01 mg/dL — AB (ref 0.61–1.24)
Calcium: 6.8 mg/dL — ABNORMAL LOW (ref 8.9–10.3)
Chloride: 101 mmol/L (ref 101–111)
GFR calc Af Amer: 5 mL/min — ABNORMAL LOW (ref >60)
GFR calc non Af Amer: 5 mL/min — ABNORMAL LOW (ref >60)
GLUCOSE: 99 mg/dL (ref 65–99)
POTASSIUM: 3.8 mmol/L (ref 3.5–5.1)
Sodium: 142 mmol/L (ref 135–145)

## 2017-07-09 NOTE — Progress Notes (Signed)
PROGRESS NOTE    David Lang  ZOX:[id number] DOB: [birth year] DOA: 07/01/2017 PCP: Florentina Jenny, MD   Brief Narrative:  HPI on 07/01/2017 by Dr. Marguerita Merles David Lang is a 67 y.o. male with medical history significant of paranoid schizophrenia, chronic kidney disease stage III, anemia of chronic disease, diabetes mellitus type 2, hypertension, hepatic steatosis, history of ulcerative esophagitis, history of recurrent falls, history of urinary tract infections and other comorbidities who presented to [facility] emergency room brought in by his caregiver for a loss of appetite.  Caregivers noticed that patient has not eaten in 3 days of last week and has been falling frequently.  Family present at bedside states the patient never this confused and knows 34 languages and is able to converse in [place].  A subjective history is not able to be obtained from the patient due to his confusion and altered mental status so it was obtained from the chart review, as well as family and the EDP.    Per report the caregiver brought the patient into the emergency room because of a loss of appetite, and nephew says that he checks on him intermittently and the patient has been falling more.  Per ED report patient has been losing more weight in the last year following and becoming confused easily with speech disturbances.  Further evaluation work-up revealed that patient had a creatinine of 15 and concern for uremic symptoms so [facility] was called to admit the patient to [facility] and have nephrology consult over there for possible need of dialysis.  Interim history Admitted for acute on chronic kidney disease with hyperkalemia. Assessment & Plan   Acute kidney injury on chronic kidney disease, stage III with anion gap metabolic acidosis and hyperkalemia -Creatinine on admission 15.28 currently down to 10.01 -Possibly secondary to hypovolemia and medications -Nephrology consulted and appreciated, no indication for  hemodialysis, patient is on a chronic hemodialysis candidate -monitor I/O  Paranoid schizophrenia -Continue clonazepam and olanzapine  Essential hypertension -continue atenolol and hydralazine   Diabetes mellitus, type II -Continue insulin sliding scale and CBG monitoring  Goals of care -Palliative care consulted and appreciated, however has not been able to reach family -Discussed with patient's sister via phone on 07/08/17, expressed wishes for patient to be DNR and would like for him to go home with hospice. -Case management consulted for arrangement of hospice at home- likely discharge on 07/10/17  Hypocalcemia -Continue Tums  Deconditioning -PT and OT recommended SNF  DVT Prophylaxis  Heparin  Code Status: DNR  Family Communication: None at bedside.   Disposition Plan: Admitted. Pending hospice arrangement at home- likely discharge on 07/10/17  Consultants Nephrology Palliative care  Procedures  None  Antibiotics   Anti-infectives (From admission, onward)   None      Subjective:   David Lang seen and examined today. Complains about his potatoes today. Denies current pain.   Objective:   Vitals:   07/08/17 1627 07/08/17 2029 07/09/17 0524 07/09/17 0940  BP: (!) 151/80 (!) 161/89 (!) 169/82 (!) 160/82  Pulse: (!) 55 (!) 57 62 69  Resp: 16 17 20 18   Temp: 98.4 F (36.9 C) 98.4 F (36.9 C) 97.9 F (36.6 C) 98.1 F (36.7 C)  TempSrc: Oral Oral Oral Oral  SpO2: 97% 97% 96% 100%  Weight:  80.4 kg (177 lb 4 oz)    Height:        Intake/Output Summary (Last 24 hours) at 07/09/2017 1053 Last data filed at 07/09/2017 0600 Gross  per 24 hour  Intake 577 ml  Output 1150 ml  Net -573 ml   Filed Weights   07/05/17 2140 07/06/17 2120 07/08/17 2029  Weight: 80.9 kg (178 lb 5.6 oz) 80.9 kg (178 lb 6.4 oz) 80.4 kg (177 lb 4 oz)   Exam  General: Well developed, well nourished, NAD, appears stated age  HEENT: NCAT, mucous membranes moist.   Neck:  Supple  Cardiovascular: S1 S2 auscultated, RRR  Respiratory: Diminished breath sounds, however clear  Abdomen: Soft, nontender, nondistended, + bowel sounds  Extremities: warm dry without cyanosis clubbing or edema  Neuro: AAOx1 (self), nonfocal  Psych: Pleasantly confused  Data Reviewed: I have personally reviewed following labs and imaging studies  CBC: Recent Labs  Lab 07/09/17 0751  WBC 11.7*  HGB 10.6*  HCT 34.3*  MCV 90.5  PLT 400   Basic Metabolic Panel: Recent Labs  Lab 07/03/17 1022 07/04/17 0801 07/05/17 0416 07/06/17 0652 07/07/17 0501 07/09/17 0751  NA 142 144 141 141 142 142  K 4.0 4.4 4.2 3.9 4.0 3.8  CL 102 99* 97* 99* 99* 101  CO2 23 26 21* 25 26 26   GLUCOSE 112* 74 60* 78 78 99  BUN 132* 125* 120* 110* 104* 90*  CREATININE 13.62* 13.20* 13.03* 12.14* 11.12* 10.01*  CALCIUM 6.9* 6.7* 6.5* 6.4* 6.7* 6.8*  PHOS 6.2*  --   --   --   --   --    GFR: Estimated Creatinine Clearance: 7.9 mL/min (A) (by C-G formula based on SCr of 10.01 mg/dL (H)). Liver Function Tests: Recent Labs  Lab 07/03/17 1022  ALBUMIN 2.6*   No results for input(s): LIPASE, AMYLASE in the last 168 hours. No results for input(s): AMMONIA in the last 168 hours. Coagulation Profile: No results for input(s): INR, PROTIME in the last 168 hours. Cardiac Enzymes: No results for input(s): CKTOTAL, CKMB, CKMBINDEX, TROPONINI in the last 168 hours. BNP (last 3 results) No results for input(s): PROBNP in the last 8760 hours. HbA1C: No results for input(s): HGBA1C in the last 72 hours. CBG: Recent Labs  Lab 07/08/17 0813 07/08/17 0847 07/08/17 1129 07/08/17 1649 07/09/17 0750  GLUCAP 62* 144* 124* 98 97   Lipid Profile: No results for input(s): CHOL, HDL, LDLCALC, TRIG, CHOLHDL, LDLDIRECT in the last 72 hours. Thyroid Function Tests: No results for input(s): TSH, T4TOTAL, FREET4, T3FREE, THYROIDAB in the last 72 hours. Anemia Panel: No results for input(s): VITAMINB12,  FOLATE, FERRITIN, TIBC, IRON, RETICCTPCT in the last 72 hours. Urine analysis:    Component Value Date/Time   COLORURINE YELLOW 07/02/2017 1210   APPEARANCEUR CLEAR 07/02/2017 1210   LABSPEC 1.010 07/02/2017 1210   PHURINE 5.0 07/02/2017 1210   GLUCOSEU NEGATIVE 07/02/2017 1210   HGBUR NEGATIVE 07/02/2017 1210   BILIRUBINUR NEGATIVE 07/02/2017 1210   KETONESUR NEGATIVE 07/02/2017 1210   PROTEINUR NEGATIVE 07/02/2017 1210   UROBILINOGEN 1.0 10/09/2014 2208   NITRITE NEGATIVE 07/02/2017 1210   LEUKOCYTESUR NEGATIVE 07/02/2017 1210   Sepsis Labs: @LABRCNTIP (procalcitonin:4,lacticidven:4)  ) Recent Results (from the past 240 hour(s))  Urine culture     Status: Abnormal   Collection Time: 07/02/17 12:49 PM  Result Value Ref Range Status   Specimen Description URINE, RANDOM  Final   Special Requests NONE  Final   Culture (A)  Final    <10,000 COLONIES/mL INSIGNIFICANT GROWTH Performed at [facility], [address], [place], [place] [address]    Report Status 07/03/2017 FINAL  Final  Radiology Studies: No results found.   Scheduled Meds: . atenolol  50 mg Oral Daily  . calcium acetate  1,334 mg Oral TID WC  . calcium carbonate  800 mg of elemental calcium Oral QHS  . clonazePAM  0.25 mg Oral QPM  . famotidine  20 mg Oral QHS  . heparin  5,000 Units Subcutaneous Q8H  . hydrALAZINE  50 mg Oral BID  . insulin aspart  0-9 Units Subcutaneous TID WC  . OLANZapine  5 mg Oral BID  . sodium citrate-citric acid  30 mL Oral TID PC & HS  . cyanocobalamin  1,000 mcg Oral Daily   Continuous Infusions:   LOS: 8 days   Time Spent in minutes   30 minutes  David Lang D.O. on 07/09/2017 at 10:53 AM  Between 7am to 7pm - Pager - [phone]  After 7pm go to [email or web address] - password [email or web address]  And look for the night coverage person covering for me after hours  [facility] Office  [phone]

## 2017-07-09 NOTE — Progress Notes (Addendum)
[facility]  Received request from [facility] for patient/family interest in hospice services at home after discharge. Chart reviewed and eligibility has been confirmed by [facility] physician. Discharge date has not been confirmed at this time. Per Lyna Poser, patient will need PTAR for transport home.   Met with patient's caregiver Lyna Poser and spoke with patient's sister Suszanne Conners by phone to explain services and confirm interest in hospice services. DME and physician coverage discussed. At [facility] request, hospital bed, OBT and 3N1 have been ordered through [facility]). Lyna Poser is contact to coordinate with [facility] for DME delivery. Her phone number is [phone]. Home address is [address] [place], [place] [address]. Lyna Poser was given [facility] contact information during this vist. Lyna Poser was given PCS and CAP applications which were found on patient's chart. She understands she and family will need to complete and submit.   Lyna Poser is aware [facility] referral center specialist will contact her to arrange RN visit after patient returns home.     Please send home with patient scripts for any medication patient does not already have including comfort medications.  Please send signed and completed out of facility DNR home with patient.   Please send final discharge summary to [phone]. Please notify [facility] at [phone] if discharged over weekend.   Please check AMION for weekend [facility] hospital liaison to follow up over weekend.   Thank you,  Erling Conte, LCSW [phone]

## 2017-07-10 DIAGNOSIS — R296 Repeated falls: Secondary | ICD-10-CM

## 2017-07-10 LAB — GLUCOSE, CAPILLARY
GLUCOSE-CAPILLARY: 83 mg/dL (ref 65–99)
GLUCOSE-CAPILLARY: 131 mg/dL — AB (ref 65–99)
Glucose-Capillary: 87 mg/dL (ref 65–99)

## 2017-07-10 MED ORDER — CALCIUM ACETATE (PHOS BINDER) 667 MG PO CAPS: 1334.0000 mg | ORAL_CAPSULE | Freq: Three times a day (TID) | ORAL | 0 refills | Status: AC

## 2017-07-10 MED ORDER — ONDANSETRON HCL 4 MG PO TABS: 4.0000 mg | ORAL_TABLET | Freq: Four times a day (QID) | ORAL | Status: AC | PRN

## 2017-07-10 MED ORDER — SOD CITRATE-CITRIC ACID 500-334 MG/5ML PO SOLN: 30.0000 mL | Freq: Three times a day (TID) | ORAL | 0 refills | Status: AC

## 2017-07-10 MED ORDER — CALCIUM CARBONATE ANTACID 500 MG PO CHEW: 800.0000 mg | CHEWABLE_TABLET | Freq: Every day | ORAL | 0 refills | Status: AC

## 2017-07-10 NOTE — Progress Notes (Addendum)
[facility]ia[facility]:  RN   Received [facility] for patient/family interest in hospice services at home after discharge. Chart reviewed and [facility]. Discharge date has not been confirmed at this time. Per Everardo PacificYusra, patient will need PTAR for transport home. UPDATE:  AV visit with [facility] will be 07/11/17 at 3:00 pm with Renae FicklePaul.    Carley Hammedva spoke with patient's caregiver Everardo PacificYusra and spoke with patient's sister Dorna MaiLeila by phone to explain services and confirm interest in hospice services. DME and physician coverage discussed. [facility], OBT and 3N1 have [facility] Everardo PacificYusra is contact to [facility] for [facility] delivery. Her phone number is [phone]. Home address is [address], [place], [place] [address]. Everardo PacificYusra was given [facility] contact information during this vist. Everardo PacificYusra was given PCS and CAP applications which were found on patient's chart. She understands she and family will need to complete and submit.  UPDATE:  Equipment was delivered, [facility] on 07/09/17.  Everardo PacificYusra is aware [facility] referral center specialist will contact her to arrange RN visit after patient returns home.     Please send home with patient scripts for any medication patient does not already have including comfort medications.  Please send signed and completed out of facility DNR home with patient.   Please send final discharge summary to [phone]. Please notify [facility] at [phone] if discharged over weekend.   Please check AMION for weekend [facility] hospital liaison to follow up over weekend.   Thank you for this referral.   Adele BarthelAmy Evans, RN, BSN [facility] Hospital Liaison [phone]  All hospital liaisons are now on AMION.

## 2017-07-10 NOTE — Discharge Summary (Signed)
Physician Discharge Summary  David Lang ZOX:[id number] DOB: September 28, 1950 DOA: 07/01/2017  PCP: Florentina Jenny, MD  Admit date: 07/01/2017 Discharge date: 07/10/2017  Time spent: 45 minutes  Recommendations for Outpatient Follow-up:  Patient will be discharged to home with hospice.  Follow up with primary care physician if needed. Patient should continue medications as prescribed.  Patient should follow a heart healthy diet.   Discharge Diagnoses:  Acute kidney injury on chronic kidney disease, stage III with anion gap metabolic acidosis and hyperkalemia Paranoid schizophrenia Essential hypertension Diabetes mellitus, type II Goals of care Hypocalcemia Deconditioning  Discharge Condition: Stable  Diet recommendation: heart healthy  Filed Weights   07/06/17 2120 07/08/17 2029 07/09/17 2051  Weight: 80.9 kg (178 lb 6.4 oz) 80.4 kg (177 lb 4 oz) 80 kg (176 lb 5.9 oz)    History of present illness:  on 07/01/2017 by Dr. Marguerita Merles Martel Mirhijis a 67 y.o.malewith medical history significant ofparanoid schizophrenia, chronic kidney disease stage III, anemia of chronic disease, diabetes mellitus type 2,hypertension, hepatic steatosis, history of ulcerative esophagitis, history of recurrent falls, history of urinary tract infections and other comorbidities who presented to [facility] emergency room brought in by his caregiver for a loss of appetite. Caregivers noticed that patient has not eaten in 3 days of last week and has been falling frequently. Family present at bedside states the patient never this confused and knows 52 languages and is able to converse in [place]. A subjective history is not able to be obtained from the patient due to his confusion and altered mental status so it was obtained from the chart review,as well as family and the EDP.  Per report the caregiver brought the patient into the emergency room because of a loss of appetite,and nephew says that he checks on  him intermittently and the patient has been falling more. Per ED report patient has been losing more weight in the last year following and becoming confused easily with speech disturbances.Further evaluation work-up revealed that patient had a creatinine of 15 and concern for uremic symptoms so [facility]H was called to [facility] and have nephrology consult over there for possible need of dialysis.  Hospital Course:  Acute kidney injury on chronic kidney disease, stage III with anion gap metabolic acidosis and hyperkalemia -Creatinine on admission 15.28 currently down to 10.01 -Possibly secondary to hypovolemia and medications -Nephrology consulted and appreciated, no indication for hemodialysis, patient is on a chronic hemodialysis candidate  Paranoid schizophrenia -Continue clonazepam and olanzapine  Essential hypertension -continue atenolol and hydralazine   Diabetes mellitus, type II -A1c 6.4  Goals of care -Palliative care consulted and appreciated, however has not been able to reach family -Discussed with patient's sister via phone on 07/08/17, expressed wishes for patient to be DNR and would like for him to go home with hospice. -Case management consulted for arrangement of hospice at home  Hypocalcemia -Continue Tums  Deconditioning -PT and OT recommended SNF  Code status: DNR  Consultants Nephrology Palliative care  Procedures  None  Discharge Exam: Vitals:   07/10/17 0518 07/10/17 0839  BP: (!) 162/75 (!) 164/76  Pulse: 66 61  Resp: 16 18  Temp: 98 F (36.7 C) 98.4 F (36.9 C)  SpO2: 93% 95%     General: Well developed, well nourished, NAD, appears stated age  HEENT: NCAT, mucous membranes moist.  Cardiovascular: S1 S2 auscultated, RRR  Respiratory: Clear to auscultation bilaterally with equal chest rise  Abdomen: Soft, nontender, nondistended, +  bowel sounds  Extremities: warm dry without cyanosis clubbing or  edema  Neuro: AAOx1 (self), nonfocal  Psych: Appropriate  Discharge Instructions  Allergies as of 07/10/2017   No Known Allergies     Medication List    STOP taking these medications   lisinopril 20 MG tablet Commonly known as:  PRINIVIL,ZESTRIL   morphine 15 MG 12 hr tablet Commonly known as:  MS CONTIN   spironolactone 25 MG tablet Commonly known as:  ALDACTONE   tamsulosin 0.4 MG Caps capsule Commonly known as:  FLOMAX     TAKE these medications   atenolol 50 MG tablet Commonly known as:  TENORMIN Take 50 mg by mouth daily.   calcium acetate 667 MG capsule Commonly known as:  PHOSLO Take 2 capsules (1,334 mg total) by mouth 3 (three) times daily with meals.   calcium carbonate 500 MG chewable tablet Commonly known as:  TUMS - dosed in mg elemental calcium Chew 4 tablets (800 mg of elemental calcium total) by mouth at bedtime.   cholecalciferol 1000 units tablet Commonly known as:  VITAMIN D Take 1,000 Units by mouth daily.   clonazePAM 0.5 MG tablet Commonly known as:  KLONOPIN Take 0.5 tablets (0.25 mg total) by mouth as directed. Take 0.25 mg scheduled once every evening, and may also take 0.25mg  once in the morning as needed for anxiety. What changed:    when to take this  additional instructions   cyanocobalamin 1000 MCG tablet Take 1 tablet (1,000 mcg total) by mouth daily.   haloperidol decanoate 100 MG/ML injection Commonly known as:  HALDOL DECANOATE Inject 100 mg into the muscle every 28 (twenty-eight) days.   hydrALAZINE 50 MG tablet Commonly known as:  APRESOLINE Take 75 mg by mouth 2 (two) times daily.   OLANZapine 5 MG tablet Commonly known as:  ZYPREXA Take 5 mg by mouth 2 (two) times daily. And may take 1 additional dose of 5mg  as needed for pychosis   ondansetron 4 MG tablet Commonly known as:  ZOFRAN Take 1 tablet (4 mg total) by mouth every 6 (six) hours as needed for nausea.   sodium citrate-citric acid 500-334 MG/5ML  solution Commonly known as:  ORACIT Take 30 mLs by mouth 4 (four) times daily - after meals and at bedtime.      No Known Allergies    The results of significant diagnostics from this hospitalization (including imaging, microbiology, ancillary and laboratory) are listed below for reference.    Significant Diagnostic Studies: [place] Renal  Result Date: 07/01/2017 CLINICAL DATA:  Elevated creatinine, altered mental status. EXAM: RENAL / URINARY TRACT ULTRASOUND COMPLETE COMPARISON:  CT abdomen pelvis October 10, 2014 FINDINGS: Right Kidney: Length: 12.4 cm. Increased cortical echogenicity. 6.1 x 6.5 x 6.3 cm exophytic upper pole anechoic cyst without septations or solid components. 2.4 x 2.3 x 2.5 cm exophytic interpolar cyst. Small volume perinephric free fluid. Left Kidney: Length: 12.9 cm. Increased cortical echogenicity. 1.3 cm exophytic cyst lower pole left kidney. Bladder: Appears normal for degree of bladder distention. Prostate is 4.9 x 4.1 x 5.4 cm. IMPRESSION: 1. Echogenic kidneys compatible with medical renal disease. No obstructive uropathy. 2. Bilateral renal cysts measuring to 6.5 cm on the right. Electronically Signed   By: Awilda Metroourtnay  Bloomer M.D.   On: 07/01/2017 17:39    Microbiology: Recent Results (from the past 240 hour(s))  Urine culture     Status: Abnormal   Collection Time: 07/02/17 12:49 PM  Result Value Ref Range Status  Specimen Description URINE, RANDOM  Final   Special Requests NONE  Final   Culture (A)  Final    <10,000 COLONIES/mL INSIGNIFICANT GROWTH Performed at [facility], [address], [place], [place] [address]    Report Status 07/03/2017 FINAL  Final     Labs: Basic Metabolic Panel: Recent Labs  Lab 07/04/17 0801 07/05/17 0416 07/06/17 0652 07/07/17 0501 07/09/17 0751  NA 144 141 141 142 142  K 4.4 4.2 3.9 4.0 3.8  CL 99* 97* 99* 99* 101  CO2 26 21* 25 26 26   GLUCOSE 74 60* 78 78 99  BUN 125* 120* 110* 104* 90*  CREATININE  13.20* 13.03* 12.14* 11.12* 10.01*  CALCIUM 6.7* 6.5* 6.4* 6.7* 6.8*   Liver Function Tests: No results for input(s): AST, ALT, ALKPHOS, BILITOT, PROT, ALBUMIN in the last 168 hours. No results for input(s): LIPASE, AMYLASE in the last 168 hours. No results for input(s): AMMONIA in the last 168 hours. CBC: Recent Labs  Lab 07/09/17 0751  WBC 11.7*  HGB 10.6*  HCT 34.3*  MCV 90.5  PLT 400   Cardiac Enzymes: No results for input(s): CKTOTAL, CKMB, CKMBINDEX, TROPONINI in the last 168 hours. BNP: BNP (last 3 results) No results for input(s): BNP in the last 8760 hours.  ProBNP (last 3 results) No results for input(s): PROBNP in the last 8760 hours.  CBG: Recent Labs  Lab 07/09/17 1230 07/09/17 1634 07/09/17 2203 07/10/17 0837 07/10/17 1134  GLUCAP 149* 130* 148* 83 131*       Signed:  Gussie Murton  [facility] 07/10/2017, 1:53 PM

## 2017-07-10 NOTE — Progress Notes (Signed)
Pt is ready to be D/C home with hospice. Arranged ambulance for transportation. [address] [place], [place] [address]

## 2017-07-10 NOTE — Progress Notes (Signed)
Pt discharged to home via ambulance. Pt is hemodynamically stable. Report given to transporters. Sherene SiresYuscra notified that the patient is on his way home. Dondra SpryMoore, Domitila Stetler Islee, RN

## 2020-06-26 DEATH — deceased
